# Patient Record
Sex: Female | Born: 1960 | Race: White | Hispanic: No | Marital: Single | Smoking: Never smoker
Health system: Southern US, Community
[De-identification: ages and names within clinical notes are randomized; demographics above are authoritative.]

## PROBLEM LIST (undated history)

## (undated) DIAGNOSIS — K3184 Gastroparesis: Secondary | ICD-10-CM

## (undated) DIAGNOSIS — E1121 Type 2 diabetes mellitus with diabetic nephropathy: Secondary | ICD-10-CM

## (undated) DIAGNOSIS — I639 Cerebral infarction, unspecified: Secondary | ICD-10-CM

## (undated) DIAGNOSIS — M8580 Other specified disorders of bone density and structure, unspecified site: Secondary | ICD-10-CM

## (undated) DIAGNOSIS — I255 Ischemic cardiomyopathy: Secondary | ICD-10-CM

## (undated) DIAGNOSIS — E1140 Type 2 diabetes mellitus with diabetic neuropathy, unspecified: Secondary | ICD-10-CM

## (undated) DIAGNOSIS — F329 Major depressive disorder, single episode, unspecified: Secondary | ICD-10-CM

## (undated) DIAGNOSIS — D509 Iron deficiency anemia, unspecified: Secondary | ICD-10-CM

## (undated) DIAGNOSIS — E785 Hyperlipidemia, unspecified: Secondary | ICD-10-CM

## (undated) DIAGNOSIS — I2510 Atherosclerotic heart disease of native coronary artery without angina pectoris: Secondary | ICD-10-CM

## (undated) DIAGNOSIS — I214 Non-ST elevation (NSTEMI) myocardial infarction: Secondary | ICD-10-CM

## (undated) DIAGNOSIS — I951 Orthostatic hypotension: Secondary | ICD-10-CM

## (undated) DIAGNOSIS — F32A Depression, unspecified: Secondary | ICD-10-CM

## (undated) DIAGNOSIS — Z8614 Personal history of Methicillin resistant Staphylococcus aureus infection: Secondary | ICD-10-CM

## (undated) DIAGNOSIS — I10 Essential (primary) hypertension: Secondary | ICD-10-CM

## (undated) HISTORY — DX: Essential (primary) hypertension: I10

## (undated) HISTORY — DX: Atherosclerotic heart disease of native coronary artery without angina pectoris: I25.10

## (undated) HISTORY — DX: Orthostatic hypotension: I95.1

## (undated) HISTORY — DX: Major depressive disorder, single episode, unspecified: F32.9

## (undated) HISTORY — DX: Other specified disorders of bone density and structure, unspecified site: M85.80

## (undated) HISTORY — DX: Type 2 diabetes mellitus with diabetic nephropathy: E11.21

## (undated) HISTORY — DX: Gastroparesis: K31.84

## (undated) HISTORY — DX: Hyperlipidemia, unspecified: E78.5

## (undated) HISTORY — PX: TONSILLECTOMY: SUR1361

## (undated) HISTORY — DX: Iron deficiency anemia, unspecified: D50.9

## (undated) HISTORY — PX: REFRACTIVE SURGERY: SHX103

## (undated) HISTORY — DX: Ischemic cardiomyopathy: I25.5

## (undated) HISTORY — DX: Depression, unspecified: F32.A

## (undated) HISTORY — DX: Personal history of Methicillin resistant Staphylococcus aureus infection: Z86.14

---

## 1978-04-23 HISTORY — PX: APPENDECTOMY: SHX54

## 1985-04-23 HISTORY — PX: TUBAL LIGATION: SHX77

## 1997-10-22 ENCOUNTER — Emergency Department (HOSPITAL_COMMUNITY): Admission: EM | Admit: 1997-10-22 | Discharge: 1997-10-22 | Payer: Self-pay | Admitting: Emergency Medicine

## 1998-09-05 ENCOUNTER — Other Ambulatory Visit: Admission: RE | Admit: 1998-09-05 | Discharge: 1998-09-05 | Payer: Self-pay | Admitting: Obstetrics and Gynecology

## 1999-06-22 ENCOUNTER — Encounter: Payer: Self-pay | Admitting: Emergency Medicine

## 1999-06-22 ENCOUNTER — Emergency Department (HOSPITAL_COMMUNITY): Admission: EM | Admit: 1999-06-22 | Discharge: 1999-06-22 | Payer: Self-pay | Admitting: Emergency Medicine

## 1999-11-23 ENCOUNTER — Other Ambulatory Visit: Admission: RE | Admit: 1999-11-23 | Discharge: 1999-11-23 | Payer: Self-pay | Admitting: Family Medicine

## 2000-12-12 ENCOUNTER — Other Ambulatory Visit: Admission: RE | Admit: 2000-12-12 | Discharge: 2000-12-12 | Payer: Self-pay | Admitting: Family Medicine

## 2001-08-20 ENCOUNTER — Encounter: Admission: RE | Admit: 2001-08-20 | Discharge: 2001-11-18 | Payer: Self-pay | Admitting: Specialist

## 2001-11-19 ENCOUNTER — Encounter: Admission: RE | Admit: 2001-11-19 | Discharge: 2001-12-01 | Payer: Self-pay | Admitting: Specialist

## 2001-12-15 ENCOUNTER — Other Ambulatory Visit: Admission: RE | Admit: 2001-12-15 | Discharge: 2001-12-15 | Payer: Self-pay | Admitting: Family Medicine

## 2003-01-14 ENCOUNTER — Other Ambulatory Visit: Admission: RE | Admit: 2003-01-14 | Discharge: 2003-01-14 | Payer: Self-pay | Admitting: Family Medicine

## 2003-01-21 ENCOUNTER — Encounter: Payer: Self-pay | Admitting: Family Medicine

## 2003-01-21 ENCOUNTER — Ambulatory Visit (HOSPITAL_COMMUNITY): Admission: RE | Admit: 2003-01-21 | Discharge: 2003-01-21 | Payer: Self-pay | Admitting: Family Medicine

## 2004-05-12 ENCOUNTER — Other Ambulatory Visit: Admission: RE | Admit: 2004-05-12 | Discharge: 2004-05-12 | Payer: Self-pay | Admitting: Family Medicine

## 2008-08-27 ENCOUNTER — Telehealth: Payer: Self-pay | Admitting: Internal Medicine

## 2008-08-30 ENCOUNTER — Encounter: Payer: Self-pay | Admitting: Internal Medicine

## 2008-08-30 ENCOUNTER — Ambulatory Visit: Payer: Self-pay | Admitting: Gastroenterology

## 2008-08-30 DIAGNOSIS — D509 Iron deficiency anemia, unspecified: Secondary | ICD-10-CM

## 2008-08-30 DIAGNOSIS — E785 Hyperlipidemia, unspecified: Secondary | ICD-10-CM | POA: Insufficient documentation

## 2008-08-30 DIAGNOSIS — I10 Essential (primary) hypertension: Secondary | ICD-10-CM | POA: Insufficient documentation

## 2008-09-07 ENCOUNTER — Telehealth: Payer: Self-pay | Admitting: Internal Medicine

## 2008-09-07 LAB — CONVERTED CEMR LAB: Ferritin: 3.6 ng/mL — ABNORMAL LOW (ref 10.0–291.0)

## 2008-09-08 ENCOUNTER — Ambulatory Visit (HOSPITAL_COMMUNITY): Admission: RE | Admit: 2008-09-08 | Discharge: 2008-09-08 | Payer: Self-pay | Admitting: Internal Medicine

## 2008-09-08 ENCOUNTER — Ambulatory Visit: Payer: Self-pay | Admitting: Internal Medicine

## 2008-09-08 ENCOUNTER — Encounter: Payer: Self-pay | Admitting: Internal Medicine

## 2008-09-09 ENCOUNTER — Encounter: Payer: Self-pay | Admitting: Internal Medicine

## 2008-09-23 ENCOUNTER — Encounter: Payer: Self-pay | Admitting: Physician Assistant

## 2009-04-25 ENCOUNTER — Telehealth (INDEPENDENT_AMBULATORY_CARE_PROVIDER_SITE_OTHER): Payer: Self-pay | Admitting: *Deleted

## 2010-03-25 ENCOUNTER — Inpatient Hospital Stay (HOSPITAL_COMMUNITY)
Admission: EM | Admit: 2010-03-25 | Discharge: 2010-04-05 | Disposition: A | Discharge status: Other Acute Inpatient Hospital | Payer: Self-pay | Source: Home / Self Care | Attending: Endocrinology | Admitting: Endocrinology

## 2010-03-29 ENCOUNTER — Ambulatory Visit (HOSPITAL_COMMUNITY)
Admission: AD | Admit: 2010-03-29 | Discharge: 2010-04-05 | Payer: Self-pay | Source: Home / Self Care | Attending: Endocrinology | Admitting: Endocrinology

## 2010-03-31 ENCOUNTER — Encounter: Payer: Self-pay | Admitting: Cardiovascular Disease

## 2010-04-05 ENCOUNTER — Encounter: Payer: Self-pay | Admitting: Cardiology

## 2010-04-09 ENCOUNTER — Inpatient Hospital Stay (HOSPITAL_COMMUNITY)
Admission: EM | Admit: 2010-04-09 | Discharge: 2010-04-12 | Payer: Self-pay | Source: Home / Self Care | Attending: Endocrinology | Admitting: Endocrinology

## 2010-04-12 ENCOUNTER — Ambulatory Visit: Payer: Self-pay | Admitting: Cardiology

## 2010-04-20 ENCOUNTER — Encounter: Payer: Self-pay | Admitting: Physician Assistant

## 2010-04-24 ENCOUNTER — Encounter: Payer: Self-pay | Admitting: Physician Assistant

## 2010-04-24 ENCOUNTER — Encounter: Payer: Self-pay | Admitting: Cardiology

## 2010-04-25 ENCOUNTER — Ambulatory Visit
Admission: RE | Admit: 2010-04-25 | Discharge: 2010-04-25 | Payer: Self-pay | Source: Home / Self Care | Attending: Physician Assistant | Admitting: Physician Assistant

## 2010-04-25 ENCOUNTER — Encounter: Payer: Self-pay | Admitting: Physician Assistant

## 2010-04-25 DIAGNOSIS — E1139 Type 2 diabetes mellitus with other diabetic ophthalmic complication: Secondary | ICD-10-CM | POA: Insufficient documentation

## 2010-04-25 DIAGNOSIS — I951 Orthostatic hypotension: Secondary | ICD-10-CM | POA: Insufficient documentation

## 2010-04-25 DIAGNOSIS — I5022 Chronic systolic (congestive) heart failure: Secondary | ICD-10-CM | POA: Insufficient documentation

## 2010-04-25 DIAGNOSIS — E109 Type 1 diabetes mellitus without complications: Secondary | ICD-10-CM | POA: Insufficient documentation

## 2010-04-25 DIAGNOSIS — I2510 Atherosclerotic heart disease of native coronary artery without angina pectoris: Secondary | ICD-10-CM | POA: Insufficient documentation

## 2010-04-25 DIAGNOSIS — I255 Ischemic cardiomyopathy: Secondary | ICD-10-CM | POA: Insufficient documentation

## 2010-04-26 ENCOUNTER — Telehealth: Payer: Self-pay | Admitting: Cardiology

## 2010-04-26 ENCOUNTER — Encounter: Payer: Self-pay | Admitting: Cardiology

## 2010-05-02 ENCOUNTER — Telehealth: Payer: Self-pay | Admitting: Cardiology

## 2010-05-03 ENCOUNTER — Telehealth: Payer: Self-pay | Admitting: Cardiology

## 2010-05-05 ENCOUNTER — Ambulatory Visit
Admission: RE | Admit: 2010-05-05 | Discharge: 2010-05-05 | Payer: Self-pay | Source: Home / Self Care | Attending: Cardiology | Admitting: Cardiology

## 2010-05-08 ENCOUNTER — Telehealth: Payer: Self-pay | Admitting: Cardiology

## 2010-05-08 ENCOUNTER — Encounter: Payer: Self-pay | Admitting: Cardiology

## 2010-05-10 ENCOUNTER — Telehealth: Payer: Self-pay | Admitting: Cardiology

## 2010-05-11 ENCOUNTER — Other Ambulatory Visit: Payer: Self-pay | Admitting: Cardiology

## 2010-05-11 ENCOUNTER — Ambulatory Visit
Admission: RE | Admit: 2010-05-11 | Discharge: 2010-05-11 | Payer: Self-pay | Source: Home / Self Care | Attending: Cardiology | Admitting: Cardiology

## 2010-05-11 ENCOUNTER — Ambulatory Visit: Admission: RE | Admit: 2010-05-11 | Discharge: 2010-05-11 | Payer: Self-pay | Source: Home / Self Care

## 2010-05-11 ENCOUNTER — Ambulatory Visit (HOSPITAL_COMMUNITY)
Admission: RE | Admit: 2010-05-11 | Discharge: 2010-05-11 | Payer: Self-pay | Source: Home / Self Care | Attending: Cardiology | Admitting: Cardiology

## 2010-05-11 ENCOUNTER — Encounter: Payer: Self-pay | Admitting: Cardiology

## 2010-05-11 LAB — BASIC METABOLIC PANEL
BUN: 14 mg/dL (ref 6–23)
CO2: 27 mEq/L (ref 19–32)
Calcium: 9.6 mg/dL (ref 8.4–10.5)
Chloride: 99 mEq/L (ref 96–112)
Creatinine, Ser: 0.9 mg/dL (ref 0.4–1.2)
GFR: 72.71 mL/min (ref >60.00)
Glucose, Bld: 276 mg/dL — ABNORMAL HIGH (ref 70–99)
Potassium: 4.4 mEq/L (ref 3.5–5.1)
Sodium: 134 mEq/L — ABNORMAL LOW (ref 135–145)

## 2010-05-11 LAB — BRAIN NATRIURETIC PEPTIDE: Pro B Natriuretic peptide (BNP): 199.3 pg/mL — ABNORMAL HIGH (ref 0.0–100.0)

## 2010-05-12 LAB — CONVERTED CEMR LAB: Digitoxin Lvl: 1 ng/mL (ref 0.8–2.0)

## 2010-05-15 ENCOUNTER — Encounter: Payer: Self-pay | Admitting: Cardiology

## 2010-05-18 ENCOUNTER — Encounter: Payer: Self-pay | Admitting: Cardiology

## 2010-05-18 ENCOUNTER — Ambulatory Visit
Admission: RE | Admit: 2010-05-18 | Discharge: 2010-05-18 | Payer: Self-pay | Source: Home / Self Care | Attending: Cardiology | Admitting: Cardiology

## 2010-05-22 ENCOUNTER — Encounter (HOSPITAL_COMMUNITY)
Admission: RE | Admit: 2010-05-22 | Discharge: 2010-05-23 | Payer: Self-pay | Source: Home / Self Care | Attending: Cardiology | Admitting: Cardiology

## 2010-05-23 NOTE — Progress Notes (Signed)
Summary: PT to come to lab  Phone Note Outgoing Call   Call placed by: Joselyn Glassman,  April 25, 2009 9:47 AM Call placed to: Patient Summary of Call: LM on home number and work number for pt to come to our labs to repeat CBC and Iron stuides she had done in June 2010.  Left her instrucftions to come to our lab in our basement level. Initial call taken by: Joselyn Glassman,  April 25, 2009 9:48 AM

## 2010-05-24 ENCOUNTER — Ambulatory Visit (HOSPITAL_COMMUNITY): Payer: Self-pay

## 2010-05-24 ENCOUNTER — Ambulatory Visit (HOSPITAL_COMMUNITY): Payer: Self-pay | Attending: Cardiology

## 2010-05-24 DIAGNOSIS — E119 Type 2 diabetes mellitus without complications: Secondary | ICD-10-CM | POA: Insufficient documentation

## 2010-05-24 DIAGNOSIS — I2589 Other forms of chronic ischemic heart disease: Secondary | ICD-10-CM | POA: Insufficient documentation

## 2010-05-24 DIAGNOSIS — I2510 Atherosclerotic heart disease of native coronary artery without angina pectoris: Secondary | ICD-10-CM | POA: Insufficient documentation

## 2010-05-24 DIAGNOSIS — I252 Old myocardial infarction: Secondary | ICD-10-CM | POA: Insufficient documentation

## 2010-05-24 DIAGNOSIS — Z5189 Encounter for other specified aftercare: Secondary | ICD-10-CM | POA: Insufficient documentation

## 2010-05-25 ENCOUNTER — Telehealth: Payer: Self-pay | Admitting: Cardiology

## 2010-05-25 NOTE — Progress Notes (Signed)
Summary: pt call to reading  Phone Note Call from Patient Call back at Home Phone [phone]   Caller: Patient Reason for Call: Talk to Nurse, Talk to Doctor Summary of Call: pt was told to call this morning with reading and it is 117/68 her feet are still a little puffy but they have gone down some Initial call taken by: Omer Jack,  May 03, 2010 9:07 AM  Follow-up for Phone Call        spoke w/pt she states edema is better, wt is the same per Dr Kathlyn [place] note on yest. phone mess rx for lasix 20 and kcl 10 sent into her pharmacy appt w/Dr Shirlee Latch sch for Fri 1/13 at 10:30, at that appt we will give her an order to have labs next week at [facility], RN  May 03, 2010 9:41 AM     New/Updated Medications: FUROSEMIDE 20 MG TABS (FUROSEMIDE) Take one tablet by mouth daily. POTASSIUM CHLORIDE CR 10 MEQ CR-CAPS (POTASSIUM CHLORIDE) Take one tablet by mouth daily Prescriptions: POTASSIUM CHLORIDE CR 10 MEQ CR-CAPS (POTASSIUM CHLORIDE) Take one tablet by mouth daily  #30 x 6   Entered by:   Meredith Staggers, RN   Authorized by:   Marca Ancona, MD   Signed by:   Meredith Staggers, RN on 05/03/2010   Method used:   [facility]ronically to        [facility] [phone]* (retail)       [address]       [place], [place]  [address]       Ph: [phone] or [phone]       Fax: [phone]   RxID:   [id number] FUROSEMIDE 20 MG TABS (FUROSEMIDE) Take one tablet by mouth daily.  #30 x 6   Entered by:   Meredith Staggers, RN   Authorized by:   Marca Ancona, MD   Signed by:   Meredith Staggers, RN on 05/03/2010   Method used:   [facility]ically to        [facility] [phone]* (retail)       [address]       [place], [place]  [address]       Ph: [phone] or [phone]       Fax: [phone]   RxID:   [id number]

## 2010-05-25 NOTE — Assessment & Plan Note (Signed)
Summary: rov   Visit Type:  rov Primary Provider:  [place], MD  CC:  Swelling in feet and going down.  History of Present Illness: 50 yo with history of Type I diabetes presents for cardiology followup.  She had a prolonged admission with DKA and systolic CHF in 12/11.  Cardiac enzymes were found to be elevated and left heart cath showed occluded LAD, which appeared chronic at the time of cath.  Echo showed peri-apical akinesis with EF 30%.  Management was complicated by orthostatic hypotension causing syncope.  This was likely due to combination of low cardiac output and diabetic autonomic neuropathy.  This limited titration of cardiac medications.    Patient has been doing fairly well at home.  She can walk up to 1/4 mile but then tires and has to stop.  Not much shortness of breath but she does note fatigue.  No orthopnea or PND.  No chest pain.  She is not getting any lightheadedness with standing now.  She called earlier in the week with significant lower extremity edema that had gradually built up.  She was not sent home from the hospital on Lasix.  I had her start Lasix 20 mg daily with KCl 10 mEeq daily.  Her ankle swelling is improving.  She is trying to get disability (she has no insurance coverage at this time, unfortunately).   ECG (prior appt): NSR, old anteroseptal MI, QRS not prolonged.   Labs (12/11): K 3.7, creatinine 0.88, BNP 612, LDL 29, HDL 44   Current Medications (verified): 1)  Humalog Kwikpen 100 Unit/ml Soln (Insulin Lispro (Human)) .... Sliding Scale 2)  Lantus Solostar 100 Unit/ml Soln (Insulin Glargine) .... 30 Units Subcutaneously At Bedtime 3)  Crestor 20 Mg Tabs (Rosuvastatin Calcium) .... Take 1 Tablet By Mouth Once A Day 4)  Venlafaxine Hcl 75 Mg Tabs (Venlafaxine Hcl) .... 2 Tabs Once Daily 5)  Multivitamins  Tabs (Multiple Vitamin) .... Take 1 Tablet By Mouth Once A Day 6)  Integra Plus  Caps (Fefum-Fepoly-Fa-B Cmp-C-Biot) .... Take One By Mouth Once  Daily 7)  Calcium Carbonate-Vitamin D 600-200 Mg-Unit Caps (Calcium Carbonate-Vitamin D) .[name].. 1 Tab Once Daily 8)  Tylenol Extra Strength 500 Mg Tabs (Acetaminophen) .[name].. 1 Tab As Needed 9)  Alprazolam 0.5 Mg Tabs (Alprazolam) .... 1/2 To 1 Tab Two Times A Day As Needed 10)  Plavix 75 Mg Tabs (Clopidogrel Bisulfate) .[name].. 1 Tab Once Daily 11)  Metoprolol Succinate 25 Mg Xr24h-Tab (Metoprolol Succinate) .... 1/2 Tab Once Daily 12)  Digoxin 0.125 Mg Tabs (Digoxin) .[name].. 1 Tab Once Daily 13)  Furosemide 20 Mg Tabs (Furosemide) .... Take One Tablet By Mouth Daily. 14)  Potassium Chloride Cr 10 Meq Cr-Caps (Potassium Chloride) .... Take One Tablet By Mouth Daily 15)  Aspirin Ec 325 Mg Tbec (Aspirin) .... Take One Tablet By Mouth Daily  Allergies (verified): 1)  Reglan 2)  Erythromycin  Past History:  Past Medical History: 1. Hypertension: However, since MI has had orthostatic hypotension.  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 CAD: NSTEMI 12/11.  Cardiac catheterization (12/11) showing LAD totalled in the midportion, diagonal 70% stenosed (small 2-mm vessel), circumflex nonobstructive, OM1 40%, OM2 30%, RCA small and no significant disease.  LAD occlusion was calcified and appeared chronic at the time of catheterization so no intervention. 5. Ischemic cardiomyopathy: Echo (12/11) with EF 30% and periapical akinesis, no significant MR, RV looked normal.  QRS is not wide on ECG.  6. Orthostatic  hypotension: Syncopal episode in 12/11 likely due to this.  Probably from combination of depressed cardiac output and diabetic autonomic neuropathy.  7. Type 1 insulin-dependent diabetes. History of DKA.  8. Hyperlipidemia.  9. Diabetic retinopathy.  10. Gastroparesis.  11. Diabetic nephropathy  12. Depression.   Family History: Reviewed history from 08/30/2008 and no changes required. No FH of Colon Cancer: Family History of Pancreatic Cancer: Father deceased 34  Social  History: Divorced, 1 boy unemployed, lives in [place] Patient has never smoked.  Alcohol Use - no Daily Caffeine Use 1 cup/day Illicit Drug Use - no  Review of Systems       All systems reviewed and negative except as per HPI.   Vital Signs:  Patient profile:   50 year old female Height:      64.75 inches Weight:      148.50 pounds BMI:     24.99 Pulse rate:   84 / minute BP sitting:   128 / 72  (left arm) Cuff size:   regular  Vitals Entered By: Caralee Ates CMA (May 05, 2010 10:25 AM)  Physical Exam  General:  Well developed, well nourished, in no acute distress. Neck:  Neck supple, no JVD. No masses, thyromegaly or abnormal cervical nodes. Lungs:  Clear bilaterally to auscultation and percussion. Heart:  Non-displaced PMI, chest non-tender; regular rate and rhythm, S1, S2 without murmurs, rubs or gallops. Carotid upstroke normal, no bruit.Pedals normal pulses. 1+ ankle edema.  Abdomen:  Bowel sounds positive; abdomen soft and non-tender without masses, organomegaly, or hernias noted. No hepatosplenomegaly. Extremities:  No clubbing or cyanosis. Neurologic:  Alert and oriented x 3. Psych:  Normal affect.   Impression & Recommendations:  Problem # 1:  CHRONIC SYSTOLIC HEART FAILURE (ICD-428.22) Ischemic cardiomyopathy with EF 30%.  Probably NYHA III symptoms.  She is on minimal cardiac meds due to problems with orthostatic hypotension.  She denies any positional lightheadedness at this point and her BP is much better (128/72 today).  I am going to carefully uptitrate her cardiac regimen. - Continue current Lasix and KCl (may decrease to every other day at next appointment). - BMET/BNP next week.  - Continue digoxin and check level.  - Continue current Toprol XL for now, may change to Coreg soon if she is doing well and tolerating meds.  - Start enalapril 1.25 mg two times a day.  If she tolerates this symptomatically, I want her to increase it to 2.5 mg two times a  day after 5 days.  - Start spironolactone 12.5 mg daily.  - Followup with me in 2 weeks.  - She will also get an echocardiogram (now at least 1 month post-MI with no intervention).  If EF still < 35%, will need an ICD.  Does not have long QRS so will not qualify for biventricular pacing.   Problem # 2:  CORONARY ATHEROSCLEROSIS NATIVE CORONARY ARTERY (ICD-414.01) MI in 12/11 with occluded LAD.  When cath was done, LAD occlusion appeared chronic so it was not opened.  Continue ASA, Plavix (would continue Plavix for a year post-MI), Crestor.  Will have her start cardiac rehab at [facility].   Problem # 3:  HYPERLIPIDEMIA (ICD-272.4) Goal LDL < 70.  It was checked in 12/11 and was at goal.   Other Orders: Echocardiogram (Echo)  Patient Instructions: 1)  Start enalapril 2.5mg  1/2 tablet two times a day. 2)  If you tolerate the low dose of enalapril, after 5 days, increase enalapril to  2.5mg  a whole tablet two times a day. 3)  Start Spironalactone 25mg  1/2 tablet once daily. 4)  Your physician has requested that you have an echocardiogram.  Echocardiography is a painless test that uses sound waves to create images of your heart. It provides your doctor with information about the size and shape of your heart and how well your heart's chambers and valves are working.  This procedure takes approximately one hour. There are no restrictions for this procedure. 5)  Return for labwork in 1 week (around wednsday): digoxin/ bmet/bnp (414.01;428.22). 6)  Your physician recommends that you schedule a follow-up appointment in: 2 weeks. Prescriptions: ENALAPRIL MALEATE 2.5 MG TABS (ENALAPRIL MALEATE) take 1/2 tablet two times a day x 5 days, then increase to 1 tablet two times a day  #60 x 6   Entered by:   Sherri Rad, RN, BSN   Authorized by:   Marca Ancona, MD   Signed by:   Sherri Rad, RN, BSN on 05/05/2010   Method used:   Electronically to        [facility] [phone]* (retail)        [address]       [place], [place]  [address]       Ph: [phone] or [phone]       Fax: [phone]   RxID:   [id number] SPIRONOLACTONE 25 MG TABS (SPIRONOLACTONE) Take 1/2  tablet by mouth daily  #30 x 6   Entered by:   Sherri Rad, RN, BSN   Authorized by:   Marca Ancona, MD   Signed by:   Sherri Rad, RN, BSN on 05/05/2010   Method used:   Electronically to        [facility] [phone]* (retail)       [address]       [place], [place]  [address]       Ph: [phone] or [phone]       Fax: [phone]   RxID:   [id number]   Appended Document: rov Plavix samples given to pt at today's office visit. # 8 tablets.  Appended Document: rov order faxed to [facility] [phone] forwarded to HIM to be scanned into EMR

## 2010-05-25 NOTE — Letter (Signed)
Summary: Patient Letter  Patient Letter   Imported By: Marylou Mccoy 04/26/2010 14:33:11  _____________________________________________________________________  External Attachment:    Type:   Image     Comment:   External Document

## 2010-05-25 NOTE — Letter (Signed)
Summary: Generic Letter  Architectural technologist, Main Office  [address]   [place], [place] [address]   Phone: [phone]  Fax: [phone]        April 26, 2010 MRN: [id number]    [name] [address] [place], [place]  [address] DOB October 31, 1960   To Whom It May Concern:  [name] is unable to work because of uncontrolled diabetes, coronary artery disease, congestive heart failure with ischemic cardiomypathy, and is experiencing some problems with short term memory.      Sincerely,      Howie Rufus,MD  This letter has been electronically signed by your physician.  Appended Document: Generic Letter mailed to pt

## 2010-05-25 NOTE — Progress Notes (Addendum)
Summary: pt having swelling in feet x 2-3 days  Phone Note Call from Patient   Caller: Patient [phone] Reason for Call: Talk to Nurse Summary of Call: pt having swelling in feet x 2-3 days Initial call taken by: Glynda Jaeger,  May 02, 2010 10:05 AM  Follow-up for Phone Call        RN s/w Pt re: pedal edema 'feet look like a bee stung them, unable to see her ankles'. Pt reports b/p 'running on the low side' although unable to provide b/p numbers. She has been recording daily weights and has only gained 1 pound. Pt denies dyspnea, SOB, dizziness or lighthededness.  RN reviewed with Tereso Newcomer, PA (seen 04/25/2010). [name] would like for Pt to elevate her feet above her heart, record b/p numbers and weight and to call back tomorrow, Wed 05/03/2010  to report if edema is better or worse. Pt agreeable to plan. Follow-up by: Bernita Raisin, RN, BSN,  May 02, 2010 11:10 AM     Appended Document: pt having swelling in feet x 2-3 days She has significant CHF, would also have her start Lasix 20 mg daily + KCl 10 daily with BMET/BNP in 1 week.  I would like to see her next week or later this week.   Appended Document: pt having swelling in feet x 2-3 days this has been taken care of, see phone mess dated 1/11

## 2010-05-25 NOTE — Letter (Signed)
Summary: Generic Letter  Architectural technologist, Main Office  [address]   [place], [place] [address]   Phone: [phone]  Fax: [phone]        May 18, 2010 MRN: [id number]    Ardelle Grau [address] [place], [place]  [address] DOB 04-19-1961    BMP in 2 weeks---414.01  428.22 Please fax results to [phone]         Freida Busman Pinchus Weckwerth,MD  This letter has been electronically signed by your physician.

## 2010-05-25 NOTE — Assessment & Plan Note (Signed)
Summary: eph   Visit Type:  EPH Primary Provider:  [place] Prince  CC:  pt denies any cardiac complaints since she has been out of the hospital...does want to know though if she is supposed to still take the digoxin after the 30 days.  History of Present Illness: Primary Cardiologist:  Dr. Marca Ancona  [name] is a 50 yo female with Type 1 DM who was admitted to [facility] in hospital December 3 with symptoms concerning for diabetic ketoacidosis.  However, she ruled in for non-ST elevation myocardial infarction.  Cardiac catheterization performed December 8 demonstrated a totally occluded LAD, 70% stenosis in a very small diagonal, no disease in the circumflex, 40% stenosis in the first obtuse marginal and 30% stenosis in the second obtuse marginal and no significant disease in the RCA.  Ejection fraction was 35% with anterior and inferior akinesis.  Medical therapy was pursued.  Echocardiography demonstrated an ejection fraction of 30% with multiple wall motion abnormalities.  She did develop systolic heart failure and was diuresed.  She also developed problems with orthostatic hypotension.  She was eventually discharged home and presented back to the hospital on December 18 with syncope. After an extensive workup, it was felt that she suffered from orthostatic hypotension resulting in her syncope.  Her ACE inhibitor and other CHF medications were discontinued.  She was placed on a very small amount of beta blocker before discharge.  She returns for followup today.  Overall, she is doing well.  She denies chest discomfort or shortness of breath.  She denies orthopnea or PND.  She denies pedal edema.  She denies lightheadedness, syncope or near syncope since she was discharged from the hospital.  She had labs performed that her primary care physician's office last week that included a CBC and a basic metabolic panel.  She has been tracking her blood pressures at home.  They have ranged anywhere from 94/49  to 129/67.    Current Medications (verified): 1)  Humalog Kwikpen 100 Unit/ml Soln (Insulin Lispro (Human)) .... Sliding Scale 2)  Lantus Solostar 100 Unit/ml Soln (Insulin Glargine) .... 30 Units Subcutaneously At Bedtime 3)  Crestor 20 Mg Tabs (Rosuvastatin Calcium) .... Take 1 Tablet By Mouth Once A Day 4)  Venlafaxine Hcl 75 Mg Tabs (Venlafaxine Hcl) .... 2 Tabs Once Daily 5)  Multivitamins  Tabs (Multiple Vitamin) .... Take 1 Tablet By Mouth Once A Day 6)  Integra Plus  Caps (Fefum-Fepoly-Fa-B Cmp-C-Biot) .... Take One By Mouth Once Daily 7)  Calcium Carbonate-Vitamin D 600-200 Mg-Unit Caps (Calcium Carbonate-Vitamin D) .[name].. 1 Tab Once Daily 8)  Tylenol Extra Strength 500 Mg Tabs (Acetaminophen) .[name].. 1 Tab As Needed 9)  Alprazolam 0.5 Mg Tabs (Alprazolam) .... 1/2 To 1 Tab Two Times A Day As Needed 10)  Plavix 75 Mg Tabs (Clopidogrel Bisulfate) .[name].. 1 Tab Once Daily 11)  Metoprolol Succinate 25 Mg Xr24h-Tab (Metoprolol Succinate) .... 1/2 Tab Once Daily 12)  Digoxin 0.125 Mg Tabs (Digoxin) .[name].. 1 Tab Once Daily  Allergies: 1)  Reglan 2)  Erythromycin  Past History:  Past Medical History: Hypertension VIT D DEFICIENCY FE DEFICIENCY ANEMIA 5/10 CAD   a.  NSTEMI on 03/26/2010   b.  cardiac catheterization showing LAD totalled in the midportion, diagonal       70% stenosed (2-mm vessel), circumflex nonobstructive, OM 140, OM       230, RCA small, and no significant disease. - medical therapy Ischemic cardiomyopathy with an EF of 35% at cath and  30% at echo  on March 31, 2010.  Type 1 insulin-dependent diabetes.  Orthostatic hypotension  Hyperlipidemia.  Autonomic neuropathy.  Diabetic retinopathy.  Gastroparesis.  Diabetic nephropathy with normal BUN and creatinine at discharge.  Depression.  Anemia.   Past Surgical History: Reviewed history from 04/11/2010 and no changes required. Appendectomy 1980 Tubal Ligation 1987 C-Section 1984 laser eye surgery x3  Social  History: Reviewed history from 08/30/2008 and no changes required. Divorced, 1 boy unemployed Patient has never smoked.  Alcohol Use - no Daily Caffeine Use 1 cup/day Illicit Drug Use - no  Vital Signs:  Patient profile:   50 year old female Height:      64.75 inches Weight:      148 pounds BMI:     24.91 Pulse rate:   78 / minute Pulse rhythm:   irregular BP sitting:   102 / 70  (left arm) Cuff size:   regular  Vitals Entered By: Danielle Rankin, CMA (April 25, 2010 2:49 PM)  Physical Exam  General:  Well nourished, well developed, in no acute distress HEENT: normal Neck: no JVD Cardiac:  normal S1, S2; RRR; no murmur Lungs:  clear to auscultation bilaterally, no wheezing, rhonchi or rales Abd: soft, nontender, no hepatomegaly Ext: no  edema Vascular: no carotid  bruits Skin: warm and dry Neuro:  CNs 2-12 intact, no focal abnormalities noted    EKG  Procedure date:  04/25/2010  Findings:      Normal Sinus Rhythm Heart rate 78 Normal axis Low-voltage Poor R-wave progression Nonspecific ST-T wave changes  Impression & Recommendations:  Problem # 1:  CORONARY ATHEROSCLEROSIS NATIVE CORONARY ARTERY (ICD-414.01)  Continue medical therapy with aspirin, Plavix and statin.  She is not having anginal symptoms.  Orders: EKG w/ Interpretation (93000)  Problem # 2:  ISCHEMIC CARDIOMYOPATHY (ICD-414.8) As noted, her ejection fraction is 30-35%.  She would likely be a candidate for AICD implantation.  However, at this time her blood pressures have limited medical therapy for her cardiomyopathy.  She will be brought back in close followup over the next several weeks.  As we are able, we will initiate ACE inhibitor.  Her blood pressure is not adequate for me to increase her beta blocker at this time.  She will continue on digoxin.  Her volume status seems stable.  I have asked her to continue to weigh herself and to contact us if she develops increased weight or increased  dyspnea and/or edema.  I will try to obtain lab work drawn at her primary care physician's office recently that included a basic metabolic panel.  As her medications are adjusted, her EF will be reassessed.  If this does not improve to >35%, she will likely need referral to EP for consideration of AICD.  She inquired about driving.  Given that she is stable without further lightheadedness or near syncope, I do not know of any reason from a cardiac standpoint that would prohibit her from driving and I discussed this with her.  Problem # 3:  CHRONIC SYSTOLIC HEART FAILURE (ICD-428.22) As above, her volume status appears stable.  Please see #2.  Problem # 4:  ORTHOSTATIC HYPOTENSION (ICD-458.0) At this point, her blood pressures appear stable.  As noted above, her blood pressures do not allow for medication titration this point.  Problem # 5:  HYPERTENSION (ICD-401.9) As above.  Problem # 6:  HYPERLIPIDEMIA (ICD-272.4) This is followed by her primary care physician.  Her updated medication list for this  problem includes:    Crestor 20 Mg Tabs (Rosuvastatin calcium) .[name]... Take 1 tablet by mouth once a day  Problem # 7:  ANEMIA-IRON DEFICIENCY (ICD-280.9) This is followed by her primary care physician.  She reports having an endoscopy and colonoscopy in 2010 that was essentially unremarkable.  Patient Instructions: 1)  Your physician recommends that you schedule a follow-up appointment in: PLEASE  CALL OFFICE [phone] TO SCHEDULE YOUR APPOINTMENT TO SEE DR. MCLEAN IN 1 MONTH AS PER Darriana Deboy, [place].  Appended Document: eph appt 05/24/10 with Dr Shirlee Latch

## 2010-05-25 NOTE — Progress Notes (Signed)
Summary: question on meds  Phone Note Call from Patient Call back at Home Phone [phone]   Reason for Call: Talk to Nurse Summary of Call: pt has question re meds. Initial call taken by: Roe Coombs,  May 08, 2010 9:28 AM  Follow-up for Phone Call        discussed medications with pt

## 2010-05-25 NOTE — Progress Notes (Signed)
Summary: mg dosage  Phone Note Call from Patient   Caller: Patient Reason for Call: Talk to Nurse Summary of Call: pt states she takes DIGOXIN 0.125 MG TABS. pt states scott weaver told her to call in how many mg she takes of DIGOXIN. Initial call taken by: Roe Coombs,  April 26, 2010 10:40 AM

## 2010-05-25 NOTE — Letter (Signed)
Summary: Self-Recorded BP  Self-Recorded BP   Imported By: Marylou Mccoy 05/08/2010 15:10:54  _____________________________________________________________________  External Attachment:    Type:   Image     Comment:   External Document

## 2010-05-25 NOTE — Assessment & Plan Note (Signed)
Summary: 2WK F/U SL   Visit Type:  Follow-up 2 week Primary Provider:  Adrian Prince, MD  CC:  slight edema in ankles. pt has no other complaints.  History of Present Illness: 50 yo with history of Type I diabetes presents for cardiology followup.  She had a prolonged admission with DKA and systolic CHF in 12/11.  Cardiac enzymes were found to be elevated and left heart cath showed occluded LAD, which appeared chronic at the time of cath.  Echo showed peri-apical akinesis with EF 30%.  Management was complicated by orthostatic hypotension causing syncope.  This was likely due to combination of low cardiac output and diabetic autonomic neuropathy.  This limited titration of cardiac medications initially.    Patient continues to do well at home.  She no longer has orthostatic symptoms.  Repeat echo this month showed EF 40-45% (improved).  She is going to start cardiac rehab on Monday.  She is able to walk about 15 minutes at a time without dyspnea.  No chest pain.  No orthopnea or PND.  She can climb a flight of steps without much trouble.  Weight is down 4 lbs since prior appointment.    ECG: NSR, old anteroseptal MI, QRS not prolonged.   Labs (12/11): K 3.7, creatinine 0.88, BNP 612, LDL 29, HDL 44 Labs (1/12): digoxin 1.0, BNP 199, K 4.4, creatinine 0.9   Current Medications (verified): 1)  Humalog Kwikpen 100 Unit/ml Soln (Insulin Lispro (Human)) .... Sliding Scale 2)  Lantus Solostar 100 Unit/ml Soln (Insulin Glargine) .... 30 Units Subcutaneously At Bedtime 3)  Crestor 20 Mg Tabs (Rosuvastatin Calcium) .... Take 1 Tablet By Mouth Once A Day 4)  Multivitamins  Tabs (Multiple Vitamin) .... Take 1 Tablet By Mouth Once A Day 5)  Calcium Carbonate-Vitamin D 600-200 Mg-Unit Caps (Calcium Carbonate-Vitamin D) .[name].. 1 Tab Once Daily 6)  Tylenol Extra Strength 500 Mg Tabs (Acetaminophen) .[name].. 1 Tab As Needed 7)  Alprazolam 0.5 Mg Tabs (Alprazolam) .... Take 1/2  Tablet By Mouth Once A Day 8)   Plavix 75 Mg Tabs (Clopidogrel Bisulfate) .[name].. 1 Tab Once Daily 9)  Metoprolol Succinate 25 Mg Xr24h-Tab (Metoprolol Succinate) .... 1/2 Tab Once Daily 10)  Digoxin 0.125 Mg Tabs (Digoxin) .[name].. 1 Tab Once Daily 11)  Furosemide 20 Mg Tabs (Furosemide) .... Take One Tablet By Mouth Daily. 12)  Potassium Chloride Cr 10 Meq Cr-Caps (Potassium Chloride) .... Take One Tablet By Mouth Daily 13)  Aspirin Ec 325 Mg Tbec (Aspirin) .... Take One Tablet By Mouth Daily 14)  Spironolactone 25 Mg Tabs (Spironolactone) .... Take 1/2  Tablet By Mouth Daily 15)  Enalapril Maleate 2.5 Mg Tabs (Enalapril Maleate) .... Take 1/2 Tablet Two Times A Day X 5 Days, Then Increase To 1 Tablet Two Times A Day 16)  Iron 325 (65 Fe) Mg Tabs (Ferrous Sulfate) .... Once Daily  Allergies (verified): 1)  Reglan 2)  Erythromycin  Past History:  Past Medical History: 1. Hypertension: However, since MI has had orthostatic hypotension.  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 CAD: NSTEMI 12/11.  Cardiac catheterization (12/11) showing LAD totalled in the midportion, diagonal 70% stenosed (small 2-mm vessel), circumflex nonobstructive, OM1 40%, OM2 30%, RCA small and no significant disease.  LAD occlusion was calcified and appeared chronic at the time of catheterization so no intervention. 5. Ischemic cardiomyopathy: Echo (12/11) with EF 30% and periapical akinesis, no significant MR, RV looked normal.  QRS is not wide on ECG.  Echo (1/12): EF 40-45%, severe hypokinesis of the anteroseptal wall, apical inferior wall, inferoseptal wall, and apex.  6. Orthostatic hypotension: Syncopal episode in 12/11 likely due to this.  Probably from combination of depressed cardiac output and diabetic autonomic neuropathy.  7. Type 1 insulin-dependent diabetes. History of DKA.  8. Hyperlipidemia.  9. Diabetic retinopathy.  10. Gastroparesis.  11. Diabetic nephropathy  12. Depression.   Family History: Reviewed history from  08/30/2008 and no changes required. No FH of Colon Cancer: Family History of Pancreatic Cancer: Father deceased 13  Social History: Reviewed history from 05/05/2010 and no changes required. Divorced, 1 boy unemployed, lives in [place] Patient has never smoked.  Alcohol Use - no Daily Caffeine Use 1 cup/day Illicit Drug Use - no  Review of Systems       All systems reviewed and negative except as per HPI.   Vital Signs:  Patient profile:   50 year old female Height:      64.75 inches Weight:      144 pounds BMI:     24.24 Pulse rate:   84 / minute BP sitting:   120 / 70  (right arm)  Vitals Entered By: Celestia Khat, CMA (May 18, 2010 10:32 AM)  Physical Exam  General:  Well developed, well nourished, in no acute distress. Head:  Annular skin lesion on forehead.  Neck:  Neck supple, no JVD. No masses, thyromegaly or abnormal cervical nodes. Lungs:  Clear bilaterally to auscultation and percussion. Heart:  Non-displaced PMI, chest non-tender; regular rate and rhythm, S1, S2 without murmurs, rubs or gallops. Carotid upstroke normal, no bruit. Pedals normal pulses. No edema.  Abdomen:  Bowel sounds positive; abdomen soft and non-tender without masses, organomegaly, or hernias noted. No hepatosplenomegaly. Extremities:  No clubbing or cyanosis. Neurologic:  Alert and oriented x 3. Psych:  Normal affect.   Impression & Recommendations:  Problem # 1:  CHRONIC SYSTOLIC HEART FAILURE (ICD-428.22) Ischemic cardiomyopathy.  Patient appears well-compensated today with NYHA class II symptoms.  She does not appear volume overloaded and weight is lower compared to prior.  BP has been doing well.  No orthostatic symptoms.  I will stop Toprol XL today and start her on Coreg 3.125 mg two times a day.  Increase spironolactone to 25 mg daily.  Continue current dose of enalapril.  Will need BMET in 2 wks, then followup with me in 6 wks.   Problem # 2:  CORONARY ATHEROSCLEROSIS  NATIVE CORONARY ARTERY (ICD-414.01) MI in 12/11 with occluded LAD.  When cath was done, LAD occlusion appeared chronic so it was not opened.  Continue ASA, Plavix (would continue Plavix for a year post-MI), Crestor.  She will start cardiac rehab soon.   Problem # 3:  HYPERLIPIDEMIA (ICD-272.4) Goal LDL < 70.  It was checked in 12/11 and was at goal.   Patient Instructions: 1)  Your physician has recommended you make the following change in your medication:  2)  Stop Toprol (metoprolol). 3)  Start Coreg(carvedilol) 3.125mg  twice a day. 4)  Use Lamisil Cream daily to affected area for 10 days. 5)  Increase Sprionolactone to 25mg  daily. 6)  Lab in 2 weeks---414.01  428.22--you have the order. Please fax the results to [phone]. 7)  Your physician recommends that you schedule a follow-up appointment in: 6 weeks with Dr Shirlee Latch. Prescriptions: LAMISIL AT 1 % CREA (TERBINAFINE HCL) apply daily to affected area for 10 days  #1 x 0   Entered by:  Katina Dung, RN, BSN   Authorized by:   Marca Ancona, MD   Signed by:   Katina Dung, RN, BSN on 05/18/2010   Method used:   Electronically to        [facility] [phone]* (retail)       [address]       [place], [place]  [address]       Ph: [phone] or [phone]       Fax: [phone]   RxID:   [id number] COREG 3.125 MG TABS (CARVEDILOL) one twice a day  #60 x 6   Entered by:   Katina Dung, RN, BSN   Authorized by:   Marca Ancona, MD   Signed by:   Katina Dung, RN, BSN on 05/18/2010   Method used:   Electronically to        [facility] [phone]* (retail)       [address]       [place], [place]  [address]       Ph: [phone] or [phone]       Fax: [phone]   RxID:   [id number] SPIRONOLACTONE 25 MG TABS (SPIRONOLACTONE) Take one daily  #30 x 6   Entered by:   Katina Dung, RN, BSN   Authorized by:   Marca Ancona, MD   Signed by:   Katina Dung, RN, BSN on 05/18/2010   Method used:   Electronically to        [facility] [phone]* (retail)       [address]       [place], [place]  [address]       Ph: [phone] or [phone]       Fax: [phone]   RxID:   [id number]

## 2010-05-25 NOTE — Progress Notes (Signed)
Summary: question on meds (lmtc x 2)  Phone Note From Other Clinic Call back at [phone] ext [phone]   Caller: latoya Summary of Call: has question re meds Initial call taken by: Roe Coombs,  May 10, 2010 12:33 PM  Follow-up for Phone Call        05/10/10--12noon--LMTCB--[facility]. Sherri Rad, RN, BSN  May 10, 2010 3:32 PM   Additional Follow-up for Phone Call Additional follow up Details #1::        [facility].  Additional Follow-up by: Lisabeth Devoid RN,  May 11, 2010 8:33 AM     Appended Document: question on meds (lmtc x 2) Meds should stay the same as discussed at office visit unless patient having significant side effects.   Appended Document: question on meds (lmtc x 2) I talked with pt by telephone

## 2010-05-25 NOTE — Letter (Signed)
Summary: Self-Recorded Meds  Self-Recorded Meds   Imported By: Marylou Mccoy 05/15/2010 13:05:26  _____________________________________________________________________  External Attachment:    Type:   Image     Comment:   External Document  Appended Document: Self-Recorded Meds Is she taking enalapril and spironolactone, which I started at last office appointment? If not, she should be taking them according to instructions given last time I saw her in the office.  If she is having difficulty managing her meds, followup with pharmacy consult for med management.   Appended Document: Self-Recorded Meds I talked with pt--she is taking enalapril and spironolactone--this must be an old med list--pt has appt 05/18/10 with Dr Shirlee Latch

## 2010-05-26 ENCOUNTER — Ambulatory Visit (HOSPITAL_COMMUNITY)
Admission: RE | Admit: 2010-05-26 | Discharge: 2010-05-26 | Disposition: A | Discharge status: Home / Self Care | Payer: Self-pay | Source: Ambulatory Visit | Attending: Cardiology | Admitting: Cardiology

## 2010-05-26 ENCOUNTER — Telehealth (INDEPENDENT_AMBULATORY_CARE_PROVIDER_SITE_OTHER): Payer: Self-pay | Admitting: *Deleted

## 2010-05-26 DIAGNOSIS — I2589 Other forms of chronic ischemic heart disease: Secondary | ICD-10-CM | POA: Insufficient documentation

## 2010-05-26 DIAGNOSIS — I252 Old myocardial infarction: Secondary | ICD-10-CM | POA: Insufficient documentation

## 2010-05-26 DIAGNOSIS — Z5189 Encounter for other specified aftercare: Secondary | ICD-10-CM | POA: Insufficient documentation

## 2010-05-26 DIAGNOSIS — I2510 Atherosclerotic heart disease of native coronary artery without angina pectoris: Secondary | ICD-10-CM | POA: Insufficient documentation

## 2010-05-26 DIAGNOSIS — E119 Type 2 diabetes mellitus without complications: Secondary | ICD-10-CM | POA: Insufficient documentation

## 2010-05-29 ENCOUNTER — Inpatient Hospital Stay (HOSPITAL_COMMUNITY)
Admission: EM | Admit: 2010-05-29 | Discharge: 2010-05-31 | DRG: 638 | Disposition: A | Discharge status: Home / Self Care | Payer: PRIVATE HEALTH INSURANCE | Attending: Endocrinology | Admitting: Endocrinology

## 2010-05-29 ENCOUNTER — Ambulatory Visit (HOSPITAL_COMMUNITY): Payer: PRIVATE HEALTH INSURANCE

## 2010-05-29 ENCOUNTER — Emergency Department (HOSPITAL_COMMUNITY): Payer: PRIVATE HEALTH INSURANCE

## 2010-05-29 ENCOUNTER — Inpatient Hospital Stay (HOSPITAL_COMMUNITY): Payer: PRIVATE HEALTH INSURANCE

## 2010-05-29 DIAGNOSIS — D649 Anemia, unspecified: Secondary | ICD-10-CM | POA: Diagnosis present

## 2010-05-29 DIAGNOSIS — E11319 Type 2 diabetes mellitus with unspecified diabetic retinopathy without macular edema: Secondary | ICD-10-CM | POA: Diagnosis present

## 2010-05-29 DIAGNOSIS — E1049 Type 1 diabetes mellitus with other diabetic neurological complication: Secondary | ICD-10-CM | POA: Diagnosis present

## 2010-05-29 DIAGNOSIS — I129 Hypertensive chronic kidney disease with stage 1 through stage 4 chronic kidney disease, or unspecified chronic kidney disease: Secondary | ICD-10-CM | POA: Diagnosis present

## 2010-05-29 DIAGNOSIS — Z7902 Long term (current) use of antithrombotics/antiplatelets: Secondary | ICD-10-CM

## 2010-05-29 DIAGNOSIS — N189 Chronic kidney disease, unspecified: Secondary | ICD-10-CM | POA: Diagnosis present

## 2010-05-29 DIAGNOSIS — F3289 Other specified depressive episodes: Secondary | ICD-10-CM | POA: Diagnosis present

## 2010-05-29 DIAGNOSIS — E1010 Type 1 diabetes mellitus with ketoacidosis without coma: Principal | ICD-10-CM | POA: Diagnosis present

## 2010-05-29 DIAGNOSIS — K3184 Gastroparesis: Secondary | ICD-10-CM | POA: Diagnosis present

## 2010-05-29 DIAGNOSIS — I252 Old myocardial infarction: Secondary | ICD-10-CM

## 2010-05-29 DIAGNOSIS — N179 Acute kidney failure, unspecified: Secondary | ICD-10-CM | POA: Diagnosis present

## 2010-05-29 DIAGNOSIS — E1065 Type 1 diabetes mellitus with hyperglycemia: Secondary | ICD-10-CM | POA: Diagnosis present

## 2010-05-29 DIAGNOSIS — G909 Disorder of the autonomic nervous system, unspecified: Secondary | ICD-10-CM | POA: Diagnosis present

## 2010-05-29 DIAGNOSIS — E785 Hyperlipidemia, unspecified: Secondary | ICD-10-CM | POA: Diagnosis present

## 2010-05-29 DIAGNOSIS — Z7982 Long term (current) use of aspirin: Secondary | ICD-10-CM

## 2010-05-29 DIAGNOSIS — E1039 Type 1 diabetes mellitus with other diabetic ophthalmic complication: Secondary | ICD-10-CM | POA: Diagnosis present

## 2010-05-29 DIAGNOSIS — Z794 Long term (current) use of insulin: Secondary | ICD-10-CM

## 2010-05-29 DIAGNOSIS — I428 Other cardiomyopathies: Secondary | ICD-10-CM | POA: Diagnosis present

## 2010-05-29 DIAGNOSIS — F329 Major depressive disorder, single episode, unspecified: Secondary | ICD-10-CM | POA: Diagnosis present

## 2010-05-29 DIAGNOSIS — I2510 Atherosclerotic heart disease of native coronary artery without angina pectoris: Secondary | ICD-10-CM | POA: Diagnosis present

## 2010-05-29 LAB — URINALYSIS, ROUTINE W REFLEX MICROSCOPIC
Ketones, ur: >80 mg/dL — AB
pH: 5 (ref 5.0–8.0)

## 2010-05-29 LAB — GLUCOSE, CAPILLARY
Glucose-Capillary: >600 mg/dL (ref 70–99)
Glucose-Capillary: 567 mg/dL (ref 70–99)
Glucose-Capillary: 448 mg/dL — ABNORMAL HIGH (ref 70–99)
Glucose-Capillary: 341 mg/dL — ABNORMAL HIGH (ref 70–99)
Glucose-Capillary: 288 mg/dL — ABNORMAL HIGH (ref 70–99)

## 2010-05-29 LAB — BASIC METABOLIC PANEL
CO2: 11 mEq/L — ABNORMAL LOW (ref 19–32)
CO2: 25 mEq/L (ref 19–32)
Calcium: 9.6 mg/dL (ref 8.4–10.5)
Calcium: 9.6 mg/dL (ref 8.4–10.5)
Calcium: 9.1 mg/dL (ref 8.4–10.5)
Chloride: 105 mEq/L (ref 96–112)
Creatinine, Ser: 1.96 mg/dL — ABNORMAL HIGH (ref 0.4–1.2)
Creatinine, Ser: 1.88 mg/dL — ABNORMAL HIGH (ref 0.4–1.2)
GFR calc Af Amer: 30 mL/min — ABNORMAL LOW (ref >60)
GFR calc non Af Amer: 24 mL/min — ABNORMAL LOW (ref >60)
GFR calc non Af Amer: 27 mL/min — ABNORMAL LOW (ref >60)
Glucose, Bld: 628 mg/dL (ref 70–99)
Glucose, Bld: 281 mg/dL — ABNORMAL HIGH (ref 70–99)
Potassium: 5.4 mEq/L — ABNORMAL HIGH (ref 3.5–5.1)
Potassium: 4.3 mEq/L (ref 3.5–5.1)
Sodium: 134 mEq/L — ABNORMAL LOW (ref 135–145)

## 2010-05-29 LAB — URINE MICROSCOPIC-ADD ON

## 2010-05-30 LAB — GLUCOSE, CAPILLARY
Glucose-Capillary: 131 mg/dL — ABNORMAL HIGH (ref 70–99)
Glucose-Capillary: 155 mg/dL — ABNORMAL HIGH (ref 70–99)
Glucose-Capillary: 160 mg/dL — ABNORMAL HIGH (ref 70–99)
Glucose-Capillary: 184 mg/dL — ABNORMAL HIGH (ref 70–99)
Glucose-Capillary: 256 mg/dL — ABNORMAL HIGH (ref 70–99)
Glucose-Capillary: 271 mg/dL — ABNORMAL HIGH (ref 70–99)
Glucose-Capillary: 358 mg/dL — ABNORMAL HIGH (ref 70–99)

## 2010-05-30 LAB — COMPREHENSIVE METABOLIC PANEL
AST: 16 U/L (ref 0–37)
Albumin: 3.4 g/dL — ABNORMAL LOW (ref 3.5–5.2)
Alkaline Phosphatase: 70 U/L (ref 39–117)
BUN: 40 mg/dL — ABNORMAL HIGH (ref 6–23)
Creatinine, Ser: 1.59 mg/dL — ABNORMAL HIGH (ref 0.4–1.2)
GFR calc non Af Amer: 34 mL/min — ABNORMAL LOW (ref >60)
Glucose, Bld: 155 mg/dL — ABNORMAL HIGH (ref 70–99)
Sodium: 140 mEq/L (ref 135–145)
Total Bilirubin: 0.6 mg/dL (ref 0.3–1.2)

## 2010-05-30 LAB — BASIC METABOLIC PANEL
BUN: 39 mg/dL — ABNORMAL HIGH (ref 6–23)
BUN: 36 mg/dL — ABNORMAL HIGH (ref 6–23)
CO2: 23 mEq/L (ref 19–32)
CO2: 22 mEq/L (ref 19–32)
Chloride: 109 mEq/L (ref 96–112)
Chloride: 105 mEq/L (ref 96–112)
Chloride: 100 mEq/L (ref 96–112)
Creatinine, Ser: 1.51 mg/dL — ABNORMAL HIGH (ref 0.4–1.2)
GFR calc Af Amer: 40 mL/min — ABNORMAL LOW (ref >60)
GFR calc non Af Amer: 36 mL/min — ABNORMAL LOW (ref >60)
Glucose, Bld: 148 mg/dL — ABNORMAL HIGH (ref 70–99)
Glucose, Bld: 318 mg/dL — ABNORMAL HIGH (ref 70–99)
Potassium: 4.2 mEq/L (ref 3.5–5.1)
Potassium: 4.5 mEq/L (ref 3.5–5.1)
Potassium: 3.8 mEq/L (ref 3.5–5.1)
Sodium: 133 mEq/L — ABNORMAL LOW (ref 135–145)

## 2010-05-30 LAB — DIFFERENTIAL
Basophils Absolute: 0 10*3/uL (ref 0.0–0.1)
Basophils Relative: 0 % (ref 0–1)
Eosinophils Absolute: 0.1 10*3/uL (ref 0.0–0.7)
Eosinophils Relative: 1 % (ref 0–5)
Lymphocytes Relative: 22 % (ref 12–46)
Monocytes Absolute: 1.3 10*3/uL — ABNORMAL HIGH (ref 0.1–1.0)

## 2010-05-30 LAB — CBC
HCT: 37.2 % (ref 36.0–46.0)
MCHC: 33.3 g/dL (ref 30.0–36.0)
Platelets: 309 10*3/uL (ref 150–400)
RDW: 13 % (ref 11.5–15.5)

## 2010-05-31 ENCOUNTER — Ambulatory Visit (HOSPITAL_COMMUNITY): Payer: PRIVATE HEALTH INSURANCE

## 2010-05-31 LAB — BRAIN NATRIURETIC PEPTIDE: Pro B Natriuretic peptide (BNP): 95 pg/mL (ref 0.0–100.0)

## 2010-05-31 LAB — GLUCOSE, CAPILLARY: Glucose-Capillary: 131 mg/dL — ABNORMAL HIGH (ref 70–99)

## 2010-05-31 LAB — COMPREHENSIVE METABOLIC PANEL
BUN: 28 mg/dL — ABNORMAL HIGH (ref 6–23)
CO2: 28 mEq/L (ref 19–32)
Calcium: 9.4 mg/dL (ref 8.4–10.5)
Chloride: 105 mEq/L (ref 96–112)
Creatinine, Ser: 1.07 mg/dL (ref 0.4–1.2)
GFR calc non Af Amer: 54 mL/min — ABNORMAL LOW (ref >60)
Total Bilirubin: 0.4 mg/dL (ref 0.3–1.2)

## 2010-05-31 LAB — CBC
MCH: 31.4 pg (ref 26.0–34.0)
MCHC: 33.4 g/dL (ref 30.0–36.0)
Platelets: 289 10*3/uL (ref 150–400)
RBC: 4.01 MIL/uL (ref 3.87–5.11)

## 2010-05-31 LAB — BASIC METABOLIC PANEL
CO2: 26 mEq/L (ref 19–32)
Calcium: 9.1 mg/dL (ref 8.4–10.5)
Creatinine, Ser: 1.38 mg/dL — ABNORMAL HIGH (ref 0.4–1.2)
GFR calc Af Amer: 49 mL/min — ABNORMAL LOW (ref >60)
GFR calc non Af Amer: 40 mL/min — ABNORMAL LOW (ref >60)
GFR calc non Af Amer: 46 mL/min — ABNORMAL LOW (ref >60)
Potassium: 4.3 mEq/L (ref 3.5–5.1)
Sodium: 137 mEq/L (ref 135–145)
Sodium: 137 mEq/L (ref 135–145)

## 2010-05-31 NOTE — Letter (Signed)
Summary: Cardiac Rehab Program  Cardiac Rehab Program   Imported By: Marylou Mccoy 05/24/2010 11:41:55  _____________________________________________________________________  External Attachment:    Type:   Image     Comment:   External Document

## 2010-05-31 NOTE — Progress Notes (Signed)
Summary: rtn call to anne  Phone Note Call from Patient   Caller: Patient [phone] Reason for Call: Talk to Nurse Summary of Call: pt rtn call to anne Initial call taken by: Glynda Jaeger,  May 25, 2010 2:01 PM  Follow-up for Phone Call        pt notified that Plavix 75mg  #90 left at front desk for her to pickup

## 2010-05-31 NOTE — Progress Notes (Signed)
  Received paper via Fax from pt asking for Records to be Sent to her Sister Suriah Peragine ) for Additional paperwork needed to Apply For Disability. Forwarded to [employer]  May 26, 2010 7:57 AM

## 2010-06-02 ENCOUNTER — Ambulatory Visit (HOSPITAL_COMMUNITY): Payer: PRIVATE HEALTH INSURANCE

## 2010-06-02 ENCOUNTER — Telehealth: Payer: Self-pay | Admitting: Cardiology

## 2010-06-02 ENCOUNTER — Telehealth (INDEPENDENT_AMBULATORY_CARE_PROVIDER_SITE_OTHER): Payer: Self-pay | Admitting: *Deleted

## 2010-06-05 ENCOUNTER — Ambulatory Visit (HOSPITAL_COMMUNITY): Payer: PRIVATE HEALTH INSURANCE

## 2010-06-05 NOTE — H&P (Signed)
[name], [name]                     ACCOUNT NO.:  [id number]  MEDICAL RECORD NO.:  [id number]           PATIENT TYPE:  I  LOCATION:  4740                         FACILITY:  [facility]  PHYSICIAN:  Geoffry Paradise, M.D.  DATE OF BIRTH:  Nov 25, 1960  DATE OF ADMISSION:  05/29/2010 DATE OF DISCHARGE:                             HISTORY & PHYSICAL   CHIEF COMPLAINT:  DKA.  HISTORY OF PRESENT ILLNESS:  [name] is a 50 year old with known cardiac history to include ASCAD/MI in November 2011, diabetes mellitus type 1 longstanding insulin, essential hypertension, hyperlipidemia, presenting at this time with nausea, vomiting, and elevated blood sugars.  She evidently has felt poorly for a few days with just fatigue leading up until this past evening.  Her blood sugar last evening she relates was in the mid 100 range, but through the night had some difficulty with at least two episodes of vomiting and thus presented to the emergency roomwith a blood sugar in excess of 500.  Despite relating relatively reasonable blood sugars in the last few days, she has had significant polyuria and polydipsia accompanying this fatigue.  She has had no sore throat, cough, congestion, fever, or abdominal symptoms.  She denies any urinary symptoms.  At this time, she has IV insulin and IV fluids ongoing and she is alert and conversant.  PAST MEDICAL HISTORY:  The patient is intolerant to erythromycin and Reglan.  MEDICATIONS: 1. Multivitamin one daily. 2. Caltrate D one daily. 3. Venlafaxine 75 mg one p.o. b.i.d. 4. Lisinopril 5 mg daily. 5. Digoxin 0.125 daily. 6. Toprol-XL 25 one half daily. 7. Alprazolam 0.5 one half to one b.i.d. 8. Aspirin 325 daily. 9. Plavix 75 mg daily. 10.Crestor 20 mg daily. 11.Humalog AC by sliding scale. 12.Lantus 30 units subcu nightly.  MEDICAL ILLNESSES: 1. Diabetes mellitus type 1, diabetic since September 1969. 2. Diabetic neuropathy, retinopathy, nephropathy. 3.  Hyperlipidemia. 4. Chronic depression. 5. Essential hypertension. 6. ASCAD, cath in 2011 with acute MI and EF 35%.  SURGICAL ILLNESSES:  Laser eye x3, appendectomy, C-section, bilateral tubal ligation, frozen shoulder.  SOCIAL HISTORY:  The patient is divorced, lives with her mother, has one child and one grandchild.  PHYSICAL EXAMINATION:  VITAL SIGNS:  Temperature is a 97.7, blood pressure 112/52, pulse 102 and regular, respiratory rate is 20 and unlabored, O2 saturation is 99%. GENERAL:  The patient is awake, alert. HEENT:  Anicteric.  Good facial symmetry.  Visual fields intact. Oropharynx, dry mucosa. NECK:  No JVD or bruits. LUNGS:  Clear. CARDIOVASCULAR:  Regular rate and rhythm, tachycardia, no murmur, gallop, rub or heave. ABDOMEN:  Soft, nontender.  Good bowel sounds. EXTREMITIES:  No cyanosis, clubbing or edema.  Intact distal pulses. Warm, no mottling.  Joints normal. NEUROLOGIC:  She is a bit drowsy, high cortical functions intact.  She is interactive.  Moves extremities x4.  Gait was not assessed.  DATA:  Glucose excess of 600.  Chemistry; sodium 134, potassium 5.4, chloride 95, CO2 is 11, glucose 628, BUN 36, creatinine 2.13, calcium 9.6.  Urinalysis; greater than 1000 glucose, large hemoglobin, greater than  80 ketones, negative leukocyte esterase, negative sediment beyond 7- 10 red cells and 0-2 white cells.  ASSESSMENT: 1. Acute diabetic ketoacidosis. 2. Acute superimposed on chronic renal failure. 3. Diabetes mellitus type 1 with associated nephropathy, retinopathy,     neuropathy. 4. Arteriosclerotic coronary artery disease with recent myocardial     infarction and ejection fraction 35%. 5. Essential hypertension. 6. Hyperlipidemia.  PLAN:  The patient admitted for IV fluids, IV insulin per DKA protocol. Further pending her course.          ______________________________ Geoffry Paradise, M.D.     RA/MEDQ  D:  05/29/2010  T:  05/30/2010  Job:   [id number]  Electronically Signed by Geoffry Paradise M.D. on 06/05/2010 09:21:35 PM

## 2010-06-05 NOTE — Discharge Summary (Signed)
NAMESolene, Hereford [name]                     ACCOUNT NO.:  [id number]  MEDICAL RECORD NO.:  [id number]           PATIENT TYPE:  I  LOCATION:  4740                         FACILITY:  [facility]  PHYSICIAN:  Tera Mater. Evlyn Kanner, M.D. DATE OF BIRTH:  04/06/1961  DATE OF ADMISSION:  05/29/2010 DATE OF DISCHARGE:  05/31/2010                              DISCHARGE SUMMARY   DISCHARGE DIAGNOSES: 1. Moderately severe diabetic ketoacidosis, clinically resolved. 2. Type 1 diabetes with chronic poor control. 3. Cardiomyopathy with no flare of congestive heart failure during     this hospitalization. 4. Hypertension, good control. 5. Hyperlipidemia, under control. 6. Depression, improved. 7. Acute renal insufficiency superimposed upon chronic kidney disease,     back to baseline. 8. Autonomic neuropathy. 9. Diabetic retinopathy. 10.Gastroparesis. 11.Anemia.  CONSULTATIONS:  None.  PROCEDURES:  IV insulin drip.  [name] is a 50 year old white female with several recent hospitalizations.  She was feeling bad for several days with poor p.o. intake, some nausea and vomiting, and elevated blood sugars.  Mostly, this was fatigue with no evidence of any fever.  She presented to Dr. Jacky Kindle and was admitted on May 29, 2010, with moderate to severe diabetic ketoacidosis with some volume deficits.  There was no clear precipitating cause of this.  Fortunately, there was no cardiac component to this hospitalization.  The patient was treated with gentle hydration and IV insulin drip.  She resolved fairly quickly and was transitioned off the drip in the early morning hours of May 30, 2010.  She was still weak and was ambulated some yesterday.  Blood sugar this morning is 131, ketones have cleared, electrolytes looked good, and she seems ready for discharge.  She has only vomited once when she first came here.  P.o. intake has been reasonable.  Blood sugars did go up some yesterday with eating.  This  morning's vital signs, blood pressure 120/74, pulse 73, respiratory rate 18, temperature 98.0, O2 sat 98%.  Her labs this morning, sodium 143, potassium 3.9, chloride 105, CO2 is 28, BUN 28, creatinine 1.07, glucose of 99, estimated GFR is 54.  Her AST is 22, ALT is 19, total protein 7, albumin 3.5, calcium 9.4.  BNP this morning is 95.  Serum ketones were negative.  This morning's white count is 10,300, hemoglobin 12.6, platelets 189,000.  At presentation, her sodium was 134, potassium 5.4, chloride 95, CO2 is 11, BUN 36, creatinine 2.16, glucose was 628, BUN was 36, creatinine 2.13, estimated GFR was 24, calcium was 9.6.  A UA was 80 mg% ketone and 1000 mg% glucose with large blood and the microscopic, however, just showed 7-10 reds and 0-2 whites.  Repeat chemistries showed that her bicarb popped up to 25, the next one 23, and then 24, so this resolved fairly quickly. A lactic acid was normal at 0.9 and repeat was normal at 1.8.  A BNP yesterday was 93.  RADIOLOGY TESTING:  A two-view chest showed mild chronic interstitial accentuation, no acute findings.  In summary, we have a 50 year old known type 1 diabetic with poor average control who presented in  diabetic ketoacidosis.  The exact cause of this is not certain, but there is no evidence of any infectious cause.  Her electrolytes are doing well.  Her white count is normal, and she seems clinically much improved.  There is no evidence any pulmonary or urinary infection.  She will return to her enalapril 2.5 once daily, her Xanax 0.5 daily, her carvedilol 3.125 at bedtime, her digoxin 0.125 daily, her Crestor 20 mg daily, her Humalog as before with 30 units of Lantus as before.  She is on an iron tablet.  She is on Lasix 20 daily, Aldactone 25 daily, a multivitamin, 325 aspirin with 75 mg Plavix, and her calcium and potassium supplements.  She is to watch her sugars carefully and report any further nausea and vomiting.  No  antibiotics are needed and no pain medications are needed.  Her diet goes back to her no concentrated sweets.          ______________________________ Tera Mater Evlyn Kanner, M.D.     SAS/MEDQ  D:  05/31/2010  T:  05/31/2010  Job:  [id number]  Electronically Signed by Adrian Prince M.D. on 06/05/2010 09:58:28 PM

## 2010-06-07 ENCOUNTER — Ambulatory Visit (HOSPITAL_COMMUNITY)
Admission: RE | Admit: 2010-06-07 | Discharge: 2010-06-07 | Disposition: A | Discharge status: Home / Self Care | Payer: PRIVATE HEALTH INSURANCE | Source: Ambulatory Visit | Attending: Cardiology | Admitting: Cardiology

## 2010-06-07 DIAGNOSIS — I2510 Atherosclerotic heart disease of native coronary artery without angina pectoris: Secondary | ICD-10-CM | POA: Insufficient documentation

## 2010-06-07 DIAGNOSIS — I252 Old myocardial infarction: Secondary | ICD-10-CM | POA: Insufficient documentation

## 2010-06-07 DIAGNOSIS — Z5189 Encounter for other specified aftercare: Secondary | ICD-10-CM | POA: Insufficient documentation

## 2010-06-08 NOTE — Progress Notes (Signed)
Summary: pt has questions  Phone Note Call from Patient   Caller: Patient [phone] Reason for Call: Talk to Nurse Summary of Call: pt calling re questions re previous heart attack Initial call taken by: Glynda Jaeger,  June 02, 2010 4:31 PM  Follow-up for Phone Call        Pt. reports she was discharged from hospital on Wednesday and has been feeling weak since then and is questioning if it is related to her medications. Hospital records show pt was hospitalized for diabetic ketoacidosis. No cardiac consult done during hospitalization and no cardiac med changes made. I told pt that weakness was probably not related to cardiac meds. Follow-up by: Dossie Arbour, RN, BSN,  June 02, 2010 5:05 PM

## 2010-06-08 NOTE — Progress Notes (Signed)
  Received ROI via fax from pt..Mailed out Echo,Cath to pt today  [facility]  June 02, 2010 9:54 AM

## 2010-06-09 ENCOUNTER — Encounter (HOSPITAL_COMMUNITY): Payer: PRIVATE HEALTH INSURANCE

## 2010-06-12 ENCOUNTER — Ambulatory Visit (HOSPITAL_COMMUNITY): Payer: PRIVATE HEALTH INSURANCE

## 2010-06-13 ENCOUNTER — Telehealth (INDEPENDENT_AMBULATORY_CARE_PROVIDER_SITE_OTHER): Payer: Self-pay | Admitting: *Deleted

## 2010-06-14 ENCOUNTER — Ambulatory Visit (HOSPITAL_COMMUNITY): Payer: PRIVATE HEALTH INSURANCE

## 2010-06-16 ENCOUNTER — Ambulatory Visit (HOSPITAL_COMMUNITY): Payer: PRIVATE HEALTH INSURANCE

## 2010-06-19 ENCOUNTER — Ambulatory Visit (HOSPITAL_COMMUNITY): Payer: PRIVATE HEALTH INSURANCE

## 2010-06-20 NOTE — Progress Notes (Signed)
  DDS Request received sent to [facility]  June 13, 2010 8:48 AM

## 2010-06-21 ENCOUNTER — Ambulatory Visit (HOSPITAL_COMMUNITY): Payer: PRIVATE HEALTH INSURANCE

## 2010-06-23 ENCOUNTER — Ambulatory Visit (HOSPITAL_COMMUNITY)
Admission: RE | Admit: 2010-06-23 | Discharge: 2010-06-23 | Disposition: A | Discharge status: Home / Self Care | Payer: PRIVATE HEALTH INSURANCE | Source: Ambulatory Visit | Attending: Cardiology | Admitting: Cardiology

## 2010-06-23 DIAGNOSIS — Z5189 Encounter for other specified aftercare: Secondary | ICD-10-CM | POA: Insufficient documentation

## 2010-06-23 DIAGNOSIS — I2510 Atherosclerotic heart disease of native coronary artery without angina pectoris: Secondary | ICD-10-CM | POA: Insufficient documentation

## 2010-06-23 DIAGNOSIS — I252 Old myocardial infarction: Secondary | ICD-10-CM | POA: Insufficient documentation

## 2010-06-25 ENCOUNTER — Inpatient Hospital Stay (HOSPITAL_COMMUNITY)
Admission: EM | Admit: 2010-06-25 | Discharge: 2010-06-29 | DRG: 637 | Disposition: A | Discharge status: Home Health | Payer: Medicaid Other | Attending: Endocrinology | Admitting: Endocrinology

## 2010-06-25 DIAGNOSIS — E1010 Type 1 diabetes mellitus with ketoacidosis without coma: Principal | ICD-10-CM | POA: Diagnosis present

## 2010-06-25 DIAGNOSIS — Z7982 Long term (current) use of aspirin: Secondary | ICD-10-CM

## 2010-06-25 DIAGNOSIS — F329 Major depressive disorder, single episode, unspecified: Secondary | ICD-10-CM | POA: Diagnosis present

## 2010-06-25 DIAGNOSIS — E1142 Type 2 diabetes mellitus with diabetic polyneuropathy: Secondary | ICD-10-CM | POA: Diagnosis present

## 2010-06-25 DIAGNOSIS — E1065 Type 1 diabetes mellitus with hyperglycemia: Secondary | ICD-10-CM | POA: Diagnosis present

## 2010-06-25 DIAGNOSIS — Z7902 Long term (current) use of antithrombotics/antiplatelets: Secondary | ICD-10-CM

## 2010-06-25 DIAGNOSIS — E11319 Type 2 diabetes mellitus with unspecified diabetic retinopathy without macular edema: Secondary | ICD-10-CM | POA: Diagnosis present

## 2010-06-25 DIAGNOSIS — N058 Unspecified nephritic syndrome with other morphologic changes: Secondary | ICD-10-CM | POA: Diagnosis present

## 2010-06-25 DIAGNOSIS — I2582 Chronic total occlusion of coronary artery: Secondary | ICD-10-CM | POA: Diagnosis present

## 2010-06-25 DIAGNOSIS — I252 Old myocardial infarction: Secondary | ICD-10-CM

## 2010-06-25 DIAGNOSIS — I10 Essential (primary) hypertension: Secondary | ICD-10-CM | POA: Diagnosis present

## 2010-06-25 DIAGNOSIS — I2510 Atherosclerotic heart disease of native coronary artery without angina pectoris: Secondary | ICD-10-CM | POA: Diagnosis present

## 2010-06-25 DIAGNOSIS — E785 Hyperlipidemia, unspecified: Secondary | ICD-10-CM | POA: Diagnosis present

## 2010-06-25 DIAGNOSIS — E1049 Type 1 diabetes mellitus with other diabetic neurological complication: Secondary | ICD-10-CM | POA: Diagnosis present

## 2010-06-25 DIAGNOSIS — Z794 Long term (current) use of insulin: Secondary | ICD-10-CM

## 2010-06-25 DIAGNOSIS — E1029 Type 1 diabetes mellitus with other diabetic kidney complication: Secondary | ICD-10-CM | POA: Diagnosis present

## 2010-06-25 DIAGNOSIS — I498 Other specified cardiac arrhythmias: Secondary | ICD-10-CM | POA: Diagnosis present

## 2010-06-25 DIAGNOSIS — I959 Hypotension, unspecified: Secondary | ICD-10-CM | POA: Diagnosis not present

## 2010-06-25 DIAGNOSIS — I2589 Other forms of chronic ischemic heart disease: Secondary | ICD-10-CM | POA: Diagnosis present

## 2010-06-25 DIAGNOSIS — F3289 Other specified depressive episodes: Secondary | ICD-10-CM | POA: Diagnosis present

## 2010-06-25 DIAGNOSIS — I214 Non-ST elevation (NSTEMI) myocardial infarction: Secondary | ICD-10-CM | POA: Diagnosis present

## 2010-06-25 DIAGNOSIS — D649 Anemia, unspecified: Secondary | ICD-10-CM | POA: Diagnosis not present

## 2010-06-25 DIAGNOSIS — E1039 Type 1 diabetes mellitus with other diabetic ophthalmic complication: Secondary | ICD-10-CM | POA: Diagnosis present

## 2010-06-25 LAB — BASIC METABOLIC PANEL
CO2: 13 mEq/L — ABNORMAL LOW (ref 19–32)
Calcium: 9.2 mg/dL (ref 8.4–10.5)
Creatinine, Ser: 2.3 mg/dL — ABNORMAL HIGH (ref 0.4–1.2)
GFR calc Af Amer: 27 mL/min — ABNORMAL LOW (ref >60)
GFR calc non Af Amer: 22 mL/min — ABNORMAL LOW (ref >60)
Sodium: 127 mEq/L — ABNORMAL LOW (ref 135–145)

## 2010-06-25 LAB — POCT I-STAT, CHEM 8
BUN: 44 mg/dL — ABNORMAL HIGH (ref 6–23)
Creatinine, Ser: 1.6 mg/dL — ABNORMAL HIGH (ref 0.4–1.2)
Glucose, Bld: >700 mg/dL (ref 70–99)
Hemoglobin: 12.6 g/dL (ref 12.0–15.0)
Potassium: 6.6 mEq/L (ref 3.5–5.1)
TCO2: 11 mmol/L (ref 0–100)

## 2010-06-25 LAB — POCT I-STAT 3, VENOUS BLOOD GAS (G3P V)
Bicarbonate: 10.9 mEq/L — ABNORMAL LOW (ref 20.0–24.0)
TCO2: 12 mmol/L (ref 0–100)
pCO2, Ven: 31.7 mmHg — ABNORMAL LOW (ref 45.0–50.0)
pH, Ven: 7.143 — CL (ref 7.250–7.300)
pO2, Ven: 34 mmHg (ref 30.0–45.0)

## 2010-06-25 LAB — URINALYSIS, ROUTINE W REFLEX MICROSCOPIC
Glucose, UA: >1000 mg/dL — AB
Hgb urine dipstick: NEGATIVE
Leukocytes, UA: NEGATIVE
Protein, ur: NEGATIVE mg/dL
Specific Gravity, Urine: 1.027 (ref 1.005–1.030)
pH: 5 (ref 5.0–8.0)

## 2010-06-25 LAB — CBC
Hemoglobin: 12.2 g/dL (ref 12.0–15.0)
MCH: 30.2 pg (ref 26.0–34.0)
Platelets: 317 10*3/uL (ref 150–400)
RBC: 4.04 MIL/uL (ref 3.87–5.11)
WBC: 12.1 10*3/uL — ABNORMAL HIGH (ref 4.0–10.5)

## 2010-06-25 LAB — CK TOTAL AND CKMB (NOT AT ARMC): Total CK: 63 U/L (ref 7–177)

## 2010-06-25 LAB — URINE MICROSCOPIC-ADD ON

## 2010-06-25 LAB — POCT CARDIAC MARKERS
Myoglobin, poc: 261 ng/mL (ref 12–200)
Troponin i, poc: <0.05 ng/mL (ref 0.00–0.09)

## 2010-06-25 LAB — DIFFERENTIAL
Basophils Relative: 0 % (ref 0–1)
Eosinophils Absolute: 0 10*3/uL (ref 0.0–0.7)
Lymphs Abs: 0.8 10*3/uL (ref 0.7–4.0)
Monocytes Relative: 2 % — ABNORMAL LOW (ref 3–12)
Neutro Abs: 11.1 10*3/uL — ABNORMAL HIGH (ref 1.7–7.7)

## 2010-06-25 LAB — GLUCOSE, CAPILLARY: Glucose-Capillary: >600 mg/dL (ref 70–99)

## 2010-06-26 ENCOUNTER — Ambulatory Visit (HOSPITAL_COMMUNITY): Payer: PRIVATE HEALTH INSURANCE

## 2010-06-26 ENCOUNTER — Inpatient Hospital Stay (HOSPITAL_COMMUNITY): Payer: Medicaid Other

## 2010-06-26 DIAGNOSIS — R112 Nausea with vomiting, unspecified: Secondary | ICD-10-CM

## 2010-06-26 DIAGNOSIS — I951 Orthostatic hypotension: Secondary | ICD-10-CM

## 2010-06-26 LAB — CARDIAC PANEL(CRET KIN+CKTOT+MB+TROPI)
Relative Index: 15.1 — ABNORMAL HIGH (ref 0.0–2.5)
Total CK: 122 U/L (ref 7–177)
Total CK: 505 U/L — ABNORMAL HIGH (ref 7–177)
Troponin I: 1.92 ng/mL (ref 0.00–0.06)

## 2010-06-26 LAB — GLUCOSE, CAPILLARY
Glucose-Capillary: >600 mg/dL (ref 70–99)
Glucose-Capillary: 516 mg/dL — ABNORMAL HIGH (ref 70–99)
Glucose-Capillary: 259 mg/dL — ABNORMAL HIGH (ref 70–99)
Glucose-Capillary: 205 mg/dL — ABNORMAL HIGH (ref 70–99)
Glucose-Capillary: 145 mg/dL — ABNORMAL HIGH (ref 70–99)
Glucose-Capillary: 106 mg/dL — ABNORMAL HIGH (ref 70–99)
Glucose-Capillary: 152 mg/dL — ABNORMAL HIGH (ref 70–99)

## 2010-06-26 LAB — BASIC METABOLIC PANEL
BUN: 50 mg/dL — ABNORMAL HIGH (ref 6–23)
BUN: 50 mg/dL — ABNORMAL HIGH (ref 6–23)
BUN: 47 mg/dL — ABNORMAL HIGH (ref 6–23)
BUN: 47 mg/dL — ABNORMAL HIGH (ref 6–23)
BUN: 46 mg/dL — ABNORMAL HIGH (ref 6–23)
BUN: 45 mg/dL — ABNORMAL HIGH (ref 6–23)
BUN: 44 mg/dL — ABNORMAL HIGH (ref 6–23)
CO2: 6 mEq/L — CL (ref 19–32)
CO2: 21 mEq/L (ref 19–32)
CO2: 21 mEq/L (ref 19–32)
CO2: 21 mEq/L (ref 19–32)
CO2: 18 mEq/L — ABNORMAL LOW (ref 19–32)
Calcium: 8.1 mg/dL — ABNORMAL LOW (ref 8.4–10.5)
Chloride: 103 mEq/L (ref 96–112)
Chloride: 108 mEq/L (ref 96–112)
Chloride: 113 mEq/L — ABNORMAL HIGH (ref 96–112)
Chloride: 116 mEq/L — ABNORMAL HIGH (ref 96–112)
Chloride: 114 mEq/L — ABNORMAL HIGH (ref 96–112)
Chloride: 109 mEq/L (ref 96–112)
Creatinine, Ser: 1.85 mg/dL — ABNORMAL HIGH (ref 0.4–1.2)
Creatinine, Ser: 1.81 mg/dL — ABNORMAL HIGH (ref 0.4–1.2)
Creatinine, Ser: 1.76 mg/dL — ABNORMAL HIGH (ref 0.4–1.2)
Creatinine, Ser: 1.72 mg/dL — ABNORMAL HIGH (ref 0.4–1.2)
GFR calc Af Amer: 26 mL/min — ABNORMAL LOW (ref >60)
GFR calc Af Amer: 30 mL/min — ABNORMAL LOW (ref >60)
GFR calc Af Amer: 38 mL/min — ABNORMAL LOW (ref >60)
GFR calc non Af Amer: 29 mL/min — ABNORMAL LOW (ref >60)
Glucose, Bld: 637 mg/dL (ref 70–99)
Glucose, Bld: 282 mg/dL — ABNORMAL HIGH (ref 70–99)
Glucose, Bld: 117 mg/dL — ABNORMAL HIGH (ref 70–99)
Glucose, Bld: 90 mg/dL (ref 70–99)
Glucose, Bld: 139 mg/dL — ABNORMAL HIGH (ref 70–99)
Glucose, Bld: 297 mg/dL — ABNORMAL HIGH (ref 70–99)
Potassium: 7.3 mEq/L (ref 3.5–5.1)
Potassium: 4.6 mEq/L (ref 3.5–5.1)
Potassium: 4.5 mEq/L (ref 3.5–5.1)
Sodium: 143 mEq/L (ref 135–145)

## 2010-06-26 LAB — CBC
HCT: 31 % — ABNORMAL LOW (ref 36.0–46.0)
MCH: 30.4 pg (ref 26.0–34.0)
MCV: 94.2 fL (ref 78.0–100.0)
RBC: 3.29 MIL/uL — ABNORMAL LOW (ref 3.87–5.11)
WBC: 17 10*3/uL — ABNORMAL HIGH (ref 4.0–10.5)

## 2010-06-26 LAB — HEPATIC FUNCTION PANEL
Albumin: 3.3 g/dL — ABNORMAL LOW (ref 3.5–5.2)
Total Bilirubin: 1.2 mg/dL (ref 0.3–1.2)
Total Protein: 6.5 g/dL (ref 6.0–8.3)

## 2010-06-26 LAB — LIPASE, BLOOD: Lipase: 35 U/L (ref 11–59)

## 2010-06-27 ENCOUNTER — Ambulatory Visit: Payer: Self-pay | Admitting: Cardiology

## 2010-06-27 DIAGNOSIS — I2510 Atherosclerotic heart disease of native coronary artery without angina pectoris: Secondary | ICD-10-CM

## 2010-06-27 LAB — BASIC METABOLIC PANEL
BUN: 42 mg/dL — ABNORMAL HIGH (ref 6–23)
CO2: 18 mEq/L — ABNORMAL LOW (ref 19–32)
CO2: 25 mEq/L (ref 19–32)
CO2: 25 mEq/L (ref 19–32)
CO2: 21 mEq/L (ref 19–32)
CO2: 23 mEq/L (ref 19–32)
Calcium: 8.6 mg/dL (ref 8.4–10.5)
Calcium: 8.6 mg/dL (ref 8.4–10.5)
Chloride: 111 mEq/L (ref 96–112)
Chloride: 112 mEq/L (ref 96–112)
Creatinine, Ser: 1.66 mg/dL — ABNORMAL HIGH (ref 0.4–1.2)
Creatinine, Ser: 1.43 mg/dL — ABNORMAL HIGH (ref 0.4–1.2)
GFR calc Af Amer: 47 mL/min — ABNORMAL LOW (ref >60)
GFR calc Af Amer: 52 mL/min — ABNORMAL LOW (ref >60)
GFR calc non Af Amer: 53 mL/min — ABNORMAL LOW (ref >60)
Glucose, Bld: 300 mg/dL — ABNORMAL HIGH (ref 70–99)
Glucose, Bld: 195 mg/dL — ABNORMAL HIGH (ref 70–99)
Glucose, Bld: 202 mg/dL — ABNORMAL HIGH (ref 70–99)
Potassium: 4.6 mEq/L (ref 3.5–5.1)
Potassium: 4 mEq/L (ref 3.5–5.1)
Potassium: 3.9 mEq/L (ref 3.5–5.1)
Sodium: 140 mEq/L (ref 135–145)
Sodium: 142 mEq/L (ref 135–145)
Sodium: 141 mEq/L (ref 135–145)
Sodium: 136 mEq/L (ref 135–145)

## 2010-06-27 LAB — COMPREHENSIVE METABOLIC PANEL
AST: 65 U/L — ABNORMAL HIGH (ref 0–37)
Albumin: 3.1 g/dL — ABNORMAL LOW (ref 3.5–5.2)
BUN: 36 mg/dL — ABNORMAL HIGH (ref 6–23)
Calcium: 8.7 mg/dL (ref 8.4–10.5)
Creatinine, Ser: 1.52 mg/dL — ABNORMAL HIGH (ref 0.4–1.2)
GFR calc Af Amer: 44 mL/min — ABNORMAL LOW (ref >60)
GFR calc non Af Amer: 36 mL/min — ABNORMAL LOW (ref >60)

## 2010-06-27 LAB — CBC
MCH: 30.2 pg (ref 26.0–34.0)
MCHC: 32.3 g/dL (ref 30.0–36.0)
Platelets: 239 10*3/uL (ref 150–400)

## 2010-06-27 LAB — GLUCOSE, CAPILLARY
Glucose-Capillary: 276 mg/dL — ABNORMAL HIGH (ref 70–99)
Glucose-Capillary: 236 mg/dL — ABNORMAL HIGH (ref 70–99)

## 2010-06-27 LAB — CARDIAC PANEL(CRET KIN+CKTOT+MB+TROPI)
CK, MB: 41.5 ng/mL (ref 0.3–4.0)
Relative Index: 14.6 — ABNORMAL HIGH (ref 0.0–2.5)
Total CK: 284 U/L — ABNORMAL HIGH (ref 7–177)

## 2010-06-27 LAB — HEPARIN LEVEL (UNFRACTIONATED): Heparin Unfractionated: 0.53 IU/mL (ref 0.30–0.70)

## 2010-06-28 ENCOUNTER — Ambulatory Visit (HOSPITAL_COMMUNITY): Payer: PRIVATE HEALTH INSURANCE

## 2010-06-28 DIAGNOSIS — I214 Non-ST elevation (NSTEMI) myocardial infarction: Secondary | ICD-10-CM

## 2010-06-28 LAB — BASIC METABOLIC PANEL
BUN: 21 mg/dL (ref 6–23)
BUN: 14 mg/dL (ref 6–23)
BUN: 15 mg/dL (ref 6–23)
CO2: 29 mEq/L (ref 19–32)
CO2: 27 mEq/L (ref 19–32)
Calcium: 8.7 mg/dL (ref 8.4–10.5)
Calcium: 9 mg/dL (ref 8.4–10.5)
Chloride: 107 mEq/L (ref 96–112)
Chloride: 107 mEq/L (ref 96–112)
Creatinine, Ser: 0.98 mg/dL (ref 0.4–1.2)
Creatinine, Ser: 1.04 mg/dL (ref 0.4–1.2)
Creatinine, Ser: 1.09 mg/dL (ref 0.4–1.2)
GFR calc Af Amer: >60 mL/min (ref >60)
GFR calc non Af Amer: 60 mL/min — ABNORMAL LOW (ref >60)
GFR calc non Af Amer: 56 mL/min — ABNORMAL LOW (ref >60)
GFR calc non Af Amer: 50 mL/min — ABNORMAL LOW (ref >60)
Glucose, Bld: 280 mg/dL — ABNORMAL HIGH (ref 70–99)
Glucose, Bld: 286 mg/dL — ABNORMAL HIGH (ref 70–99)
Glucose, Bld: 233 mg/dL — ABNORMAL HIGH (ref 70–99)
Potassium: 3.8 mEq/L (ref 3.5–5.1)
Potassium: 3.8 mEq/L (ref 3.5–5.1)
Sodium: 138 mEq/L (ref 135–145)
Sodium: 141 mEq/L (ref 135–145)
Sodium: 141 mEq/L (ref 135–145)
Sodium: 140 mEq/L (ref 135–145)

## 2010-06-28 LAB — COMPREHENSIVE METABOLIC PANEL
BUN: 17 mg/dL (ref 6–23)
Calcium: 8.5 mg/dL (ref 8.4–10.5)
Creatinine, Ser: 0.9 mg/dL (ref 0.4–1.2)
GFR calc non Af Amer: >60 mL/min (ref >60)
Glucose, Bld: 76 mg/dL (ref 70–99)
Sodium: 143 mEq/L (ref 135–145)
Total Protein: 6 g/dL (ref 6.0–8.3)

## 2010-06-28 LAB — GLUCOSE, CAPILLARY
Glucose-Capillary: 133 mg/dL — ABNORMAL HIGH (ref 70–99)
Glucose-Capillary: 198 mg/dL — ABNORMAL HIGH (ref 70–99)
Glucose-Capillary: 93 mg/dL (ref 70–99)
Glucose-Capillary: 95 mg/dL (ref 70–99)
Glucose-Capillary: 299 mg/dL — ABNORMAL HIGH (ref 70–99)

## 2010-06-28 NOTE — Consult Note (Addendum)
NAMESAHANA, [name]                     ACCOUNT NO.:  [id number]  MEDICAL RECORD NO.:  [id number]           PATIENT TYPE:  I  LOCATION:  4715                         FACILITY:  [facility]  PHYSICIAN:  Cassell Clement, M.D. DATE OF BIRTH:  05/11/1960  DATE OF CONSULTATION:  06/26/2010 DATE OF DISCHARGE:                                CONSULTATION   CHIEF COMPLAINT:  Nausea, vomiting, sleepiness, and elevate sugar  HISTORY OF PRESENT ILLNESS:  [name] is a 50 year old female with a history of coronary artery disease status post NSTEMI in December 2012 with cardiac cath at that time showing totalled LAD felt unfavorable for PCI with 70% stenosis and a small first diagonal, treated medically at that time.  She also has a history of type 1 diabetes with DKA in February 2012 as well as orthostatic hypotension causing syncope in the setting of decreased cardiac output.  Her last EF was estimated at 35% on cath March 30, 2010.  She presented to [facility] with her mother who apparently found her lying in pool of vomit.  Her mother states that her blood sugar was around 150 at 9 o'clock yesterday morning, but finding her daughter at around 5:00 p.m., the glucometer read as "high," and she was instructed by her PCP to proceed to the ER. Her blood sugar on admit was 958. She was acidotic with a pH of 7.143 and an acute renal failure with a creatinine of 2.30 with last outpatient creatinine in the 1.2 range since February.  EKG shows sinus tachycardia with mild ST depression in V5-V6, but otherwise no acute changes.  Cardiac enzymes were drawn, which demonstrated negative troponin x2, but in the third set she had an MB of 18.4  and troponin of 1.92.  She was started on the glucometer protocol and continued on aspirin and Plavix.  Beta blocker has been changed from Coreg to Toprol, and her ACE inhibitor has been held.  She had decreased blood pressure to 70/40 last night prior to the third  set of cardiac enzymes.  The patient remains somnolent, and her mother states that she has been this way over the past 3 days even in the presence of normal blood sugars. CT of head is pending.  The patient is sleepy, but arousable and cooperative with some questions, and denies any chest pain or shortness of breath whatsoever.  Her mother corroborates the story saying that the patient has had no complaints of such.  Also noted is that the vomit was typically dark colored per her mother, not coffee-ground emesis, but almost black in color.  She also has had an annular lesion on her forehead for approximately 1 month, and has been started on Lamisil here in the hospital yesterday.  PAST MEDICAL HISTORY: 1. Type 1 diabetes, with DKA in February 2012.     a.     History of gastroparesis, autonomic neuropathy, diabetic      retinopathy, and nephropathy. 2. Hypertension with history of orthostatic hypotension felt to be the     cause of syncope in December 2011 in the  setting of depressed     cardiac output. 3. Hyperlipidemia. 4. Coronary artery disease.     a.     NSTEMI March 27, 2011, with cardiac catheterization      March 31, 2011, demonstrating totalled LAD, not felt to be      acute, unfavorable for PCI with some disease in the first      diagonal, but only 2 mm vessels, felt best treated medically. 5. Ischemic cardiomyopathy with EF last assessed at 35% by cath     March 31, 2011, akinesis of the distal anterior wall, apex and     distal inferior wall.  Prior assessment was done by echo May 11, 2010, with an EF 40-45%. 6. Anemia. 7. Depression.  PAST SURGICAL HISTORY:  Laser eye surgery, appendectomy, BTL, C- sections, and frozen shoulder extension.  OUTPATIENT MEDICATIONS: 1. Enalapril 2.5 mg b.i.d. 2. Xanax 0.5 mg half tablet. 3. Coreg 3.125 mg at bedtime. 4. Digoxin 0.125 mg daily. 5. Crestor 20 mg at bedtime. 6. Humalog 10 units t.i.d. 7. Lantus 20  units b.i.d. 8. Iron 60 mg daily. 9. Lasix 20 mg daily. 10.Spironolactone 25 mg daily. 11.Multivitamin. 12.Aspirin 325 mg daily. 13.Plavix 75 mg daily. 14.Calcium carbonate/vitamin D. 15.Potassium chloride 10 mEq daily.  INPATIENT MEDICATIONS: 1. Aspirin 325 mg daily. 2. Plavix 75 mg daily. 3. Toprol 12.5 mg daily, which has been substituted for her Coreg. 4. Multivitamins. 5. Crestor 20 mg daily. 6. Lamisil 1 application b.i.d. 7. Insulin drip.  ALLERGIES:  ERYTHROMYCIN and REGLAN.  SOCIAL HISTORY:  [name] lives with her mother.  She is divorced and has one child.  She denies any tobacco or alcohol use.  FAMILY HISTORY:  Mother is alive and well.  Father died of pancreatic cancer in his 64.  No apparent premature history of coronary artery disease.  REVIEW OF SYSTEMS:  No fevers, chills, sweats, weight changes, chest pain, shortness of breath.  Urinary, hematuria, bright blood per rectum, melena, or hematemesis, but did have black vomit.  See HPI for pertinent positives.  Also noted edema of the hand.  All other systems reviewed and otherwise negative.  LABORATORY DATA:  WBC 17, hemoglobin 10, hematocrit 31, platelet count 269.  Sodium 143, potassium 3.9, chloride 113, CO2 21, glucose 282, BUN 49, creatinine 2.08.  LFTs were within normal limits with the exception of decreased albumin at 3.3 on June 26, 2010.  First set of cardiac markers show an elevated myoglobin 261, but negative MB and troponin. Second set showed an elevated MB at 9.4, but negative CK and troponin. Third set showed CK of 122, MB of 18.4, and troponin 1.92.  PH 7.143, pCO2 of 31.7, pO2 of 34.  Anion gap on admission was 23.  STUDIES:  None yet.  PHYSICAL EXAMINATION:  VITAL SIGNS:  Temperature 97.8, pulse 99, respirations 20, blood pressure 94/54 with fall to 70/40 last night, pulse ox 98% on room air. GENERAL:  This is a pale somnolent white female in no acute distress. HEENT:  Normocephalic,  atraumatic with extraocular movements intact. Clear sclerae.  Nares without discharge.  She has an annular pale erythematous lesion on her forehead without apparent scaling or separation. NECK:  Supple without carotid bruits. HEART:  Auscultation of the heart reveals regular rhythm, slightly tachycardic with S1 and S2 without murmurs, rubs, or gallops. LUNGS:  Decreased breath sounds at bases, but otherwise clear without wheezes, rales, or rhonchi. ABDOMEN:  Soft, nontender, nondistended with positive bowel  sounds. EXTREMITIES:  Warm and dry.  She has some edema of her fingers bilaterally.  No lower extremity edema.  She has mild petechiae of the finger pads.  The family correlates the petechiae with fingerstick from the glucometer, but they are apparent bilaterally. NEUROLOGIC:  She is somnolent but arousable and follows commands.  She knows her birthday and where she is.  She does not practice in the conversation verbally but instead nods her head.  ASSESSMENT/PLAN:  The patient was seen and examined by Dr. Patty Sermons and myself.  This is a 50 year old female with a history of diabetes, orthostatic hypotension, coronary artery disease as described above, renal insufficiency, as well as ischemic cardiomyopathy with an EF of 35% who presents with altered mental status and diabetic ketoacidosis. The altered mental status is of uncertain etiology, and per the patient's mother proceed abnormality of her sugars at home.  CT of the head is pending at this time.  She has been started on the glucometer protocol for diabetic ketoacidosis, and her blood sugar has fallen to less than 250 this morning, and her anion gap is no longer present.  Her creatinine is also improving from 2.3 to 2.08 this morning with fluid resuscitation.  She had hypotension last night with a blood pressure of 70/40, which may be a contributing factor to her elevated cardiac enzymes.  They are consistent with an enzyme  leak due to profound hypotension in the setting of her severe metabolic derangement and diabetic ketoacidosis.  She does have known coronary artery disease, but denies any chest pain or shortness of breath.  Her rhythm has been stable, maintaining normal sinus rhythm with no acute EKG changes.  Exam reveals clear lungs with only mild edema in her hands.  It is unclear the etiology of her petechiae fingers, although she is afebrile.  We will also defer treatment of the annular lesion to the primary care team.    From a cardiology standpoint, we would recommend to continue the patient on telemetry monitoring, continue the IV fluid, resuscitation paying full attention to her volume status given her decreased ejection fraction, and continue cycling serial cardiac enzymes.  She will be continued on her aspirin and Plavix.  There was some known doctor's progress note from today indicating the patient is allergic to PLAVIX, but the patient's family vehemently denies that she has any such reaction and has taken Plavix without interruption and therefore, will be continued on it. Dr. Patty Sermons does not feel she requires any anticoagulation at present.  We will wait for improved blood pressure before bringing some of her cardiac medications back on board.  No further ischemic workup planned at this time, pending stability of her cardiac enzymes.  We will continue to follow with you.     Ronie Spies, P.A.C.   ______________________________ Cassell Clement, M.D.   DD/MEDQ  D:  06/26/2010  T:  06/27/2010  Job:  [id number]  cc:   Marca Ancona, MD  Electronically Signed by Ronie Spies  on 06/27/2010 07:43:20 PM Electronically Signed by Cassell Clement M.D. on 06/29/2010 04:19:15 PM

## 2010-06-29 LAB — BASIC METABOLIC PANEL
BUN: 10 mg/dL (ref 6–23)
CO2: 28 mEq/L (ref 19–32)
Calcium: 8.5 mg/dL (ref 8.4–10.5)
Chloride: 108 mEq/L (ref 96–112)
Creatinine, Ser: 0.81 mg/dL (ref 0.4–1.2)
GFR calc Af Amer: >60 mL/min (ref >60)
Glucose, Bld: 96 mg/dL (ref 70–99)

## 2010-06-29 LAB — CBC
Hemoglobin: 10.3 g/dL — ABNORMAL LOW (ref 12.0–15.0)
MCH: 30.5 pg (ref 26.0–34.0)
MCHC: 32.8 g/dL (ref 30.0–36.0)
MCV: 92.9 fL (ref 78.0–100.0)
RBC: 3.38 MIL/uL — ABNORMAL LOW (ref 3.87–5.11)

## 2010-06-30 ENCOUNTER — Ambulatory Visit (HOSPITAL_COMMUNITY): Payer: PRIVATE HEALTH INSURANCE

## 2010-07-03 ENCOUNTER — Ambulatory Visit (HOSPITAL_COMMUNITY): Payer: PRIVATE HEALTH INSURANCE

## 2010-07-03 LAB — BASIC METABOLIC PANEL
BUN: 13 mg/dL (ref 6–23)
BUN: 13 mg/dL (ref 6–23)
BUN: 17 mg/dL (ref 6–23)
CO2: 26 mEq/L (ref 19–32)
CO2: 26 mEq/L (ref 19–32)
Calcium: 8.9 mg/dL (ref 8.4–10.5)
Chloride: 101 mEq/L (ref 96–112)
Chloride: 105 mEq/L (ref 96–112)
Creatinine, Ser: 0.88 mg/dL (ref 0.4–1.2)
Creatinine, Ser: 1.09 mg/dL (ref 0.4–1.2)
GFR calc Af Amer: >60 mL/min (ref >60)
GFR calc non Af Amer: >60 mL/min (ref >60)
Glucose, Bld: 156 mg/dL — ABNORMAL HIGH (ref 70–99)
Glucose, Bld: 358 mg/dL — ABNORMAL HIGH (ref 70–99)
Potassium: 3.6 mEq/L (ref 3.5–5.1)

## 2010-07-03 LAB — CBC
HCT: 24.8 % — ABNORMAL LOW (ref 36.0–46.0)
HCT: 25.2 % — ABNORMAL LOW (ref 36.0–46.0)
Hemoglobin: 9.6 g/dL — ABNORMAL LOW (ref 12.0–15.0)
MCH: 30 pg (ref 26.0–34.0)
MCH: 30.9 pg (ref 26.0–34.0)
MCHC: 32.1 g/dL (ref 30.0–36.0)
MCHC: 33.1 g/dL (ref 30.0–36.0)
MCHC: 33.7 g/dL (ref 30.0–36.0)
MCV: 91.6 fL (ref 78.0–100.0)
RBC: 3.11 MIL/uL — ABNORMAL LOW (ref 3.87–5.11)
RDW: 13.3 % (ref 11.5–15.5)
RDW: 13.5 % (ref 11.5–15.5)

## 2010-07-03 LAB — DIFFERENTIAL
Basophils Absolute: 0 10*3/uL (ref 0.0–0.1)
Basophils Relative: 1 % (ref 0–1)
Eosinophils Absolute: 0.1 10*3/uL (ref 0.0–0.7)
Lymphs Abs: 1.7 10*3/uL (ref 0.7–4.0)
Monocytes Absolute: 0.4 10*3/uL (ref 0.1–1.0)
Monocytes Absolute: 0.5 10*3/uL (ref 0.1–1.0)
Monocytes Relative: 6 % (ref 3–12)
Neutro Abs: 4.2 10*3/uL (ref 1.7–7.7)
Neutro Abs: 5.7 10*3/uL (ref 1.7–7.7)
Neutrophils Relative %: 71 % (ref 43–77)

## 2010-07-03 LAB — GLUCOSE, CAPILLARY
Glucose-Capillary: 111 mg/dL — ABNORMAL HIGH (ref 70–99)
Glucose-Capillary: 117 mg/dL — ABNORMAL HIGH (ref 70–99)
Glucose-Capillary: 141 mg/dL — ABNORMAL HIGH (ref 70–99)
Glucose-Capillary: 147 mg/dL — ABNORMAL HIGH (ref 70–99)
Glucose-Capillary: 162 mg/dL — ABNORMAL HIGH (ref 70–99)
Glucose-Capillary: 261 mg/dL — ABNORMAL HIGH (ref 70–99)
Glucose-Capillary: 263 mg/dL — ABNORMAL HIGH (ref 70–99)
Glucose-Capillary: 276 mg/dL — ABNORMAL HIGH (ref 70–99)
Glucose-Capillary: 283 mg/dL — ABNORMAL HIGH (ref 70–99)
Glucose-Capillary: 92 mg/dL (ref 70–99)

## 2010-07-03 LAB — URINE MICROSCOPIC-ADD ON

## 2010-07-03 LAB — URINALYSIS, ROUTINE W REFLEX MICROSCOPIC
Glucose, UA: >1000 mg/dL — AB
Protein, ur: NEGATIVE mg/dL
Specific Gravity, Urine: 1.033 — ABNORMAL HIGH (ref 1.005–1.030)
pH: 6 (ref 5.0–8.0)

## 2010-07-03 LAB — COMPREHENSIVE METABOLIC PANEL
ALT: 13 U/L (ref 0–35)
BUN: 12 mg/dL (ref 6–23)
Calcium: 8.5 mg/dL (ref 8.4–10.5)
Glucose, Bld: 119 mg/dL — ABNORMAL HIGH (ref 70–99)
Sodium: 138 mEq/L (ref 135–145)
Total Protein: 5.4 g/dL — ABNORMAL LOW (ref 6.0–8.3)

## 2010-07-03 LAB — TSH: TSH: 5.418 u[IU]/mL — ABNORMAL HIGH (ref 0.350–4.500)

## 2010-07-03 LAB — URINE CULTURE

## 2010-07-03 LAB — ACTH STIMULATION, 3 TIME POINTS
Cortisol, 30 Min: 30.4 ug/dL (ref >20)
Cortisol, 60 Min: 34.2 ug/dL (ref >20)

## 2010-07-04 LAB — CBC
HCT: 33.5 % — ABNORMAL LOW (ref 36.0–46.0)
HCT: 34.2 % — ABNORMAL LOW (ref 36.0–46.0)
Hemoglobin: 10.8 g/dL — ABNORMAL LOW (ref 12.0–15.0)
Hemoglobin: 11.4 g/dL — ABNORMAL LOW (ref 12.0–15.0)
Hemoglobin: 12.3 g/dL (ref 12.0–15.0)
Hemoglobin: 13.7 g/dL (ref 12.0–15.0)
Hemoglobin: 13.8 g/dL (ref 12.0–15.0)
MCH: 30.8 pg (ref 26.0–34.0)
MCH: 30.8 pg (ref 26.0–34.0)
MCH: 30.9 pg (ref 26.0–34.0)
MCH: 31.3 pg (ref 26.0–34.0)
MCH: 31.6 pg (ref 26.0–34.0)
MCHC: 32.4 g/dL (ref 30.0–36.0)
MCHC: 35 g/dL (ref 30.0–36.0)
MCHC: 35.1 g/dL (ref 30.0–36.0)
MCV: 89.9 fL (ref 78.0–100.0)
MCV: 90.8 fL (ref 78.0–100.0)
MCV: 93.9 fL (ref 78.0–100.0)
MCV: 94.5 fL (ref 78.0–100.0)
MCV: 95.1 fL (ref 78.0–100.0)
Platelets: 149 10*3/uL — ABNORMAL LOW (ref 150–400)
Platelets: 169 10*3/uL (ref 150–400)
Platelets: 218 10*3/uL (ref 150–400)
Platelets: 228 10*3/uL (ref 150–400)
Platelets: 257 10*3/uL (ref 150–400)
Platelets: 414 10*3/uL — ABNORMAL HIGH (ref 150–400)
Platelets: 466 10*3/uL — ABNORMAL HIGH (ref 150–400)
RBC: 3.44 MIL/uL — ABNORMAL LOW (ref 3.87–5.11)
RBC: 3.45 MIL/uL — ABNORMAL LOW (ref 3.87–5.11)
RBC: 3.5 MIL/uL — ABNORMAL LOW (ref 3.87–5.11)
RBC: 3.9 MIL/uL (ref 3.87–5.11)
RBC: 4.41 MIL/uL (ref 3.87–5.11)
RDW: 12.8 % (ref 11.5–15.5)
RDW: 12.9 % (ref 11.5–15.5)
RDW: 13 % (ref 11.5–15.5)
RDW: 13.2 % (ref 11.5–15.5)
WBC: 10.5 10*3/uL (ref 4.0–10.5)
WBC: 14.6 10*3/uL — ABNORMAL HIGH (ref 4.0–10.5)
WBC: 6.7 10*3/uL (ref 4.0–10.5)
WBC: 9.6 10*3/uL (ref 4.0–10.5)
WBC: 9.8 10*3/uL (ref 4.0–10.5)
WBC: 9.9 10*3/uL (ref 4.0–10.5)

## 2010-07-04 LAB — GLUCOSE, CAPILLARY
Glucose-Capillary: 102 mg/dL — ABNORMAL HIGH (ref 70–99)
Glucose-Capillary: 114 mg/dL — ABNORMAL HIGH (ref 70–99)
Glucose-Capillary: 114 mg/dL — ABNORMAL HIGH (ref 70–99)
Glucose-Capillary: 116 mg/dL — ABNORMAL HIGH (ref 70–99)
Glucose-Capillary: 122 mg/dL — ABNORMAL HIGH (ref 70–99)
Glucose-Capillary: 133 mg/dL — ABNORMAL HIGH (ref 70–99)
Glucose-Capillary: 135 mg/dL — ABNORMAL HIGH (ref 70–99)
Glucose-Capillary: 137 mg/dL — ABNORMAL HIGH (ref 70–99)
Glucose-Capillary: 137 mg/dL — ABNORMAL HIGH (ref 70–99)
Glucose-Capillary: 139 mg/dL — ABNORMAL HIGH (ref 70–99)
Glucose-Capillary: 139 mg/dL — ABNORMAL HIGH (ref 70–99)
Glucose-Capillary: 144 mg/dL — ABNORMAL HIGH (ref 70–99)
Glucose-Capillary: 151 mg/dL — ABNORMAL HIGH (ref 70–99)
Glucose-Capillary: 154 mg/dL — ABNORMAL HIGH (ref 70–99)
Glucose-Capillary: 157 mg/dL — ABNORMAL HIGH (ref 70–99)
Glucose-Capillary: 157 mg/dL — ABNORMAL HIGH (ref 70–99)
Glucose-Capillary: 158 mg/dL — ABNORMAL HIGH (ref 70–99)
Glucose-Capillary: 160 mg/dL — ABNORMAL HIGH (ref 70–99)
Glucose-Capillary: 160 mg/dL — ABNORMAL HIGH (ref 70–99)
Glucose-Capillary: 161 mg/dL — ABNORMAL HIGH (ref 70–99)
Glucose-Capillary: 165 mg/dL — ABNORMAL HIGH (ref 70–99)
Glucose-Capillary: 166 mg/dL — ABNORMAL HIGH (ref 70–99)
Glucose-Capillary: 172 mg/dL — ABNORMAL HIGH (ref 70–99)
Glucose-Capillary: 172 mg/dL — ABNORMAL HIGH (ref 70–99)
Glucose-Capillary: 174 mg/dL — ABNORMAL HIGH (ref 70–99)
Glucose-Capillary: 176 mg/dL — ABNORMAL HIGH (ref 70–99)
Glucose-Capillary: 193 mg/dL — ABNORMAL HIGH (ref 70–99)
Glucose-Capillary: 193 mg/dL — ABNORMAL HIGH (ref 70–99)
Glucose-Capillary: 200 mg/dL — ABNORMAL HIGH (ref 70–99)
Glucose-Capillary: 212 mg/dL — ABNORMAL HIGH (ref 70–99)
Glucose-Capillary: 215 mg/dL — ABNORMAL HIGH (ref 70–99)
Glucose-Capillary: 219 mg/dL — ABNORMAL HIGH (ref 70–99)
Glucose-Capillary: 221 mg/dL — ABNORMAL HIGH (ref 70–99)
Glucose-Capillary: 221 mg/dL — ABNORMAL HIGH (ref 70–99)
Glucose-Capillary: 228 mg/dL — ABNORMAL HIGH (ref 70–99)
Glucose-Capillary: 230 mg/dL — ABNORMAL HIGH (ref 70–99)
Glucose-Capillary: 232 mg/dL — ABNORMAL HIGH (ref 70–99)
Glucose-Capillary: 238 mg/dL — ABNORMAL HIGH (ref 70–99)
Glucose-Capillary: 243 mg/dL — ABNORMAL HIGH (ref 70–99)
Glucose-Capillary: 252 mg/dL — ABNORMAL HIGH (ref 70–99)
Glucose-Capillary: 253 mg/dL — ABNORMAL HIGH (ref 70–99)
Glucose-Capillary: 257 mg/dL — ABNORMAL HIGH (ref 70–99)
Glucose-Capillary: 259 mg/dL — ABNORMAL HIGH (ref 70–99)
Glucose-Capillary: 261 mg/dL — ABNORMAL HIGH (ref 70–99)
Glucose-Capillary: 261 mg/dL — ABNORMAL HIGH (ref 70–99)
Glucose-Capillary: 265 mg/dL — ABNORMAL HIGH (ref 70–99)
Glucose-Capillary: 276 mg/dL — ABNORMAL HIGH (ref 70–99)
Glucose-Capillary: 277 mg/dL — ABNORMAL HIGH (ref 70–99)
Glucose-Capillary: 279 mg/dL — ABNORMAL HIGH (ref 70–99)
Glucose-Capillary: 279 mg/dL — ABNORMAL HIGH (ref 70–99)
Glucose-Capillary: 283 mg/dL — ABNORMAL HIGH (ref 70–99)
Glucose-Capillary: 285 mg/dL — ABNORMAL HIGH (ref 70–99)
Glucose-Capillary: 299 mg/dL — ABNORMAL HIGH (ref 70–99)
Glucose-Capillary: 326 mg/dL — ABNORMAL HIGH (ref 70–99)
Glucose-Capillary: 359 mg/dL — ABNORMAL HIGH (ref 70–99)
Glucose-Capillary: 359 mg/dL — ABNORMAL HIGH (ref 70–99)
Glucose-Capillary: 393 mg/dL — ABNORMAL HIGH (ref 70–99)
Glucose-Capillary: 446 mg/dL — ABNORMAL HIGH (ref 70–99)
Glucose-Capillary: 470 mg/dL — ABNORMAL HIGH (ref 70–99)
Glucose-Capillary: 569 mg/dL (ref 70–99)
Glucose-Capillary: 81 mg/dL (ref 70–99)
Glucose-Capillary: 86 mg/dL (ref 70–99)
Glucose-Capillary: 99 mg/dL (ref 70–99)

## 2010-07-04 LAB — DIFFERENTIAL
Basophils Absolute: 0 10*3/uL (ref 0.0–0.1)
Lymphocytes Relative: 18 % (ref 12–46)
Lymphocytes Relative: 4 % — ABNORMAL LOW (ref 12–46)
Lymphs Abs: 1.8 10*3/uL (ref 0.7–4.0)
Monocytes Absolute: 0.7 10*3/uL (ref 0.1–1.0)
Monocytes Relative: 7 % (ref 3–12)
Neutro Abs: 13.4 10*3/uL — ABNORMAL HIGH (ref 1.7–7.7)
Neutro Abs: 7.4 10*3/uL (ref 1.7–7.7)
Neutrophils Relative %: 75 % (ref 43–77)

## 2010-07-04 LAB — BASIC METABOLIC PANEL
BUN: 10 mg/dL (ref 6–23)
BUN: 10 mg/dL (ref 6–23)
BUN: 11 mg/dL (ref 6–23)
BUN: 11 mg/dL (ref 6–23)
BUN: 13 mg/dL (ref 6–23)
BUN: 8 mg/dL (ref 6–23)
BUN: 8 mg/dL (ref 6–23)
BUN: 8 mg/dL (ref 6–23)
BUN: 9 mg/dL (ref 6–23)
CO2: 20 mEq/L (ref 19–32)
CO2: 21 mEq/L (ref 19–32)
CO2: 22 mEq/L (ref 19–32)
CO2: 24 mEq/L (ref 19–32)
CO2: 24 mEq/L (ref 19–32)
CO2: 27 mEq/L (ref 19–32)
CO2: 28 mEq/L (ref 19–32)
CO2: 8 mEq/L — CL (ref 19–32)
Calcium: 7.5 mg/dL — ABNORMAL LOW (ref 8.4–10.5)
Calcium: 7.8 mg/dL — ABNORMAL LOW (ref 8.4–10.5)
Calcium: 7.8 mg/dL — ABNORMAL LOW (ref 8.4–10.5)
Calcium: 7.9 mg/dL — ABNORMAL LOW (ref 8.4–10.5)
Calcium: 8 mg/dL — ABNORMAL LOW (ref 8.4–10.5)
Calcium: 8 mg/dL — ABNORMAL LOW (ref 8.4–10.5)
Calcium: 8.3 mg/dL — ABNORMAL LOW (ref 8.4–10.5)
Calcium: 8.4 mg/dL (ref 8.4–10.5)
Calcium: 8.6 mg/dL (ref 8.4–10.5)
Calcium: 8.9 mg/dL (ref 8.4–10.5)
Chloride: 103 mEq/L (ref 96–112)
Chloride: 105 mEq/L (ref 96–112)
Chloride: 106 mEq/L (ref 96–112)
Chloride: 110 mEq/L (ref 96–112)
Chloride: 113 mEq/L — ABNORMAL HIGH (ref 96–112)
Chloride: 113 mEq/L — ABNORMAL HIGH (ref 96–112)
Chloride: 118 mEq/L — ABNORMAL HIGH (ref 96–112)
Creatinine, Ser: 1.03 mg/dL (ref 0.4–1.2)
Creatinine, Ser: 1.03 mg/dL (ref 0.4–1.2)
Creatinine, Ser: 1.1 mg/dL (ref 0.4–1.2)
Creatinine, Ser: 1.12 mg/dL (ref 0.4–1.2)
Creatinine, Ser: 1.13 mg/dL (ref 0.4–1.2)
Creatinine, Ser: 1.14 mg/dL (ref 0.4–1.2)
Creatinine, Ser: 1.16 mg/dL (ref 0.4–1.2)
Creatinine, Ser: 1.21 mg/dL — ABNORMAL HIGH (ref 0.4–1.2)
Creatinine, Ser: 1.22 mg/dL — ABNORMAL HIGH (ref 0.4–1.2)
Creatinine, Ser: 1.24 mg/dL — ABNORMAL HIGH (ref 0.4–1.2)
Creatinine, Ser: 1.34 mg/dL — ABNORMAL HIGH (ref 0.4–1.2)
Creatinine, Ser: 1.72 mg/dL — ABNORMAL HIGH (ref 0.4–1.2)
GFR calc Af Amer: 36 mL/min — ABNORMAL LOW (ref >60)
GFR calc Af Amer: 38 mL/min — ABNORMAL LOW (ref >60)
GFR calc Af Amer: 50 mL/min — ABNORMAL LOW (ref >60)
GFR calc Af Amer: 55 mL/min — ABNORMAL LOW (ref >60)
GFR calc Af Amer: 56 mL/min — ABNORMAL LOW (ref >60)
GFR calc Af Amer: 57 mL/min — ABNORMAL LOW (ref >60)
GFR calc Af Amer: 60 mL/min — ABNORMAL LOW (ref >60)
GFR calc Af Amer: >60 mL/min (ref >60)
GFR calc Af Amer: >60 mL/min (ref >60)
GFR calc Af Amer: >60 mL/min (ref >60)
GFR calc Af Amer: >60 mL/min (ref >60)
GFR calc Af Amer: >60 mL/min (ref >60)
GFR calc non Af Amer: 30 mL/min — ABNORMAL LOW (ref >60)
GFR calc non Af Amer: 31 mL/min — ABNORMAL LOW (ref >60)
GFR calc non Af Amer: 42 mL/min — ABNORMAL LOW (ref >60)
GFR calc non Af Amer: 46 mL/min — ABNORMAL LOW (ref >60)
GFR calc non Af Amer: 49 mL/min — ABNORMAL LOW (ref >60)
GFR calc non Af Amer: 51 mL/min — ABNORMAL LOW (ref >60)
GFR calc non Af Amer: 52 mL/min — ABNORMAL LOW (ref >60)
GFR calc non Af Amer: 53 mL/min — ABNORMAL LOW (ref >60)
GFR calc non Af Amer: 55 mL/min — ABNORMAL LOW (ref >60)
GFR calc non Af Amer: 56 mL/min — ABNORMAL LOW (ref >60)
GFR calc non Af Amer: >60 mL/min (ref >60)
Glucose, Bld: 159 mg/dL — ABNORMAL HIGH (ref 70–99)
Glucose, Bld: 178 mg/dL — ABNORMAL HIGH (ref 70–99)
Glucose, Bld: 226 mg/dL — ABNORMAL HIGH (ref 70–99)
Glucose, Bld: 255 mg/dL — ABNORMAL HIGH (ref 70–99)
Glucose, Bld: 426 mg/dL — ABNORMAL HIGH (ref 70–99)
Glucose, Bld: 531 mg/dL — ABNORMAL HIGH (ref 70–99)
Potassium: 3.1 mEq/L — ABNORMAL LOW (ref 3.5–5.1)
Potassium: 3.4 mEq/L — ABNORMAL LOW (ref 3.5–5.1)
Potassium: 3.6 mEq/L (ref 3.5–5.1)
Potassium: 3.8 mEq/L (ref 3.5–5.1)
Potassium: 4 mEq/L (ref 3.5–5.1)
Potassium: 4.1 mEq/L (ref 3.5–5.1)
Sodium: 136 mEq/L (ref 135–145)
Sodium: 138 mEq/L (ref 135–145)
Sodium: 138 mEq/L (ref 135–145)
Sodium: 139 mEq/L (ref 135–145)
Sodium: 139 mEq/L (ref 135–145)
Sodium: 140 mEq/L (ref 135–145)
Sodium: 140 mEq/L (ref 135–145)
Sodium: 141 mEq/L (ref 135–145)
Sodium: 141 mEq/L (ref 135–145)
Sodium: 145 mEq/L (ref 135–145)
Sodium: 146 mEq/L — ABNORMAL HIGH (ref 135–145)

## 2010-07-04 LAB — HEPARIN LEVEL (UNFRACTIONATED)
Heparin Unfractionated: 0.21 IU/mL — ABNORMAL LOW (ref 0.30–0.70)
Heparin Unfractionated: 0.24 IU/mL — ABNORMAL LOW (ref 0.30–0.70)
Heparin Unfractionated: 0.36 IU/mL (ref 0.30–0.70)

## 2010-07-04 LAB — MAGNESIUM
Magnesium: 1.5 mg/dL (ref 1.5–2.5)
Magnesium: 1.6 mg/dL (ref 1.5–2.5)
Magnesium: 1.6 mg/dL (ref 1.5–2.5)
Magnesium: 1.7 mg/dL (ref 1.5–2.5)
Magnesium: 1.8 mg/dL (ref 1.5–2.5)
Magnesium: 1.8 mg/dL (ref 1.5–2.5)

## 2010-07-04 LAB — CARBOXYHEMOGLOBIN
O2 Saturation: 54.6 %
Total hemoglobin: 11.6 g/dL — ABNORMAL LOW (ref 12.5–16.0)

## 2010-07-04 LAB — LIPID PANEL
HDL: 44 mg/dL (ref >39)
Total CHOL/HDL Ratio: 2.2 RATIO
Triglycerides: 110 mg/dL (ref <150)

## 2010-07-04 LAB — COMPREHENSIVE METABOLIC PANEL
Albumin: 2.4 g/dL — ABNORMAL LOW (ref 3.5–5.2)
Albumin: 3.9 g/dL (ref 3.5–5.2)
BUN: 18 mg/dL (ref 6–23)
BUN: 20 mg/dL (ref 6–23)
CO2: 10 mEq/L — ABNORMAL LOW (ref 19–32)
CO2: 29 mEq/L (ref 19–32)
Calcium: 10.1 mg/dL (ref 8.4–10.5)
Calcium: 8.4 mg/dL (ref 8.4–10.5)
Chloride: 105 mEq/L (ref 96–112)
Chloride: 119 mEq/L — ABNORMAL HIGH (ref 96–112)
Creatinine, Ser: 1.49 mg/dL — ABNORMAL HIGH (ref 0.4–1.2)
Creatinine, Ser: 1.73 mg/dL — ABNORMAL HIGH (ref 0.4–1.2)
GFR calc Af Amer: >60 mL/min (ref >60)
GFR calc non Af Amer: 31 mL/min — ABNORMAL LOW (ref >60)
Glucose, Bld: 130 mg/dL — ABNORMAL HIGH (ref 70–99)
Potassium: 4.5 mEq/L (ref 3.5–5.1)
Sodium: 141 mEq/L (ref 135–145)
Total Bilirubin: 1.2 mg/dL (ref 0.3–1.2)
Total Protein: 8 g/dL (ref 6.0–8.3)

## 2010-07-04 LAB — CARDIAC PANEL(CRET KIN+CKTOT+MB+TROPI)
CK, MB: 13 ng/mL (ref 0.3–4.0)
Relative Index: 6.4 — ABNORMAL HIGH (ref 0.0–2.5)
Total CK: 203 U/L — ABNORMAL HIGH (ref 7–177)
Troponin I: 10.06 ng/mL (ref 0.00–0.06)
Troponin I: 12.22 ng/mL (ref 0.00–0.06)
Troponin I: 9.44 ng/mL (ref 0.00–0.06)

## 2010-07-04 LAB — BRAIN NATRIURETIC PEPTIDE
Pro B Natriuretic peptide (BNP): 324 pg/mL — ABNORMAL HIGH (ref 0.0–100.0)
Pro B Natriuretic peptide (BNP): 420 pg/mL — ABNORMAL HIGH (ref 0.0–100.0)
Pro B Natriuretic peptide (BNP): 648 pg/mL — ABNORMAL HIGH (ref 0.0–100.0)

## 2010-07-04 LAB — URINE MICROSCOPIC-ADD ON

## 2010-07-04 LAB — URINALYSIS, ROUTINE W REFLEX MICROSCOPIC
Glucose, UA: >1000 mg/dL — AB
Hgb urine dipstick: NEGATIVE
Leukocytes, UA: NEGATIVE
Specific Gravity, Urine: 1.031 — ABNORMAL HIGH (ref 1.005–1.030)

## 2010-07-04 LAB — PROTIME-INR: Prothrombin Time: 14.3 seconds (ref 11.6–15.2)

## 2010-07-04 LAB — HEPATIC FUNCTION PANEL
Albumin: 2.4 g/dL — ABNORMAL LOW (ref 3.5–5.2)
Alkaline Phosphatase: 102 U/L (ref 39–117)
Indirect Bilirubin: 0.8 mg/dL (ref 0.3–0.9)
Total Protein: 5.8 g/dL — ABNORMAL LOW (ref 6.0–8.3)

## 2010-07-04 LAB — PHOSPHORUS
Phosphorus: 1.6 mg/dL — ABNORMAL LOW (ref 2.3–4.6)
Phosphorus: 1.7 mg/dL — ABNORMAL LOW (ref 2.3–4.6)
Phosphorus: 2.3 mg/dL (ref 2.3–4.6)

## 2010-07-05 ENCOUNTER — Ambulatory Visit (HOSPITAL_COMMUNITY): Payer: PRIVATE HEALTH INSURANCE

## 2010-07-07 ENCOUNTER — Ambulatory Visit (HOSPITAL_COMMUNITY): Payer: PRIVATE HEALTH INSURANCE

## 2010-07-09 NOTE — H&P (Signed)
NAMEVICTORIANA, [name]                     ACCOUNT NO.:  [id number]  MEDICAL RECORD NO.:  [id number]           PATIENT TYPE:  I  LOCATION:  4715                         FACILITY:  [facility]  PHYSICIAN:  Gaspar Garbe, M.D.DATE OF BIRTH:  11-11-1960  DATE OF ADMISSION:  06/25/2010 DATE OF DISCHARGE:                             HISTORY & PHYSICAL   CHIEF COMPLAINT:  Nausea, vomiting, high sugars.  HISTORY OF PRESENT ILLNESS:  The patient is a 50 year old white female with a history of diabetic ketoacidosis 3 weeks ago, which required admission as well.  She had coronary artery disease and stenting done in November 2011.  She indicates that she has not been feeling well for the past couple of days. Most of her history is given through her mother as she is not very talkative.  Mother indicates that she has been somewhat lethargic, tired.  Mother is certain that her blood sugar was in 120s this morning when she woke up, that she took her 10 units of Lantus that she normally does, was able to eat.  After that point, their history became somewhat fragmented insisting that she has taken sliding scale at lunch.  They cannot tell me what her blood sugar was at lunch or how much sliding scale she had.  She states she felt bad with nausea and vomiting and was found to have a blood sugar that was unbeatable on home meter.  He called Dr. Evlyn Kanner who has been the on-call doctor in the emergency room.  Noted at the emergency room, her blood glucose was greater than 700 __________  The patient denies any chest pain and occasional nausea and vomiting and increased urination, but just generally felt tired over the past couple of days.  ALLERGIES:  ERYTHROMYCIN and REGLAN.  MEDICATIONS: 1. Lantus 10 units subcu twice daily. 2. Humalog sliding scale insulin. 3. Crestor 20 mg daily. 4. Toprol-XL 12.5 mg daily. 5. Lisinopril 5 mg daily. 6. Aspirin 325 mg daily. 7. Plavix 75 mg daily. 8. Effexor 75 mg  twice daily, this is missing from her home list     though was on her discharge list 3 weeks ago and was also present     in her office chart. 9. Digoxin 0.25 mg daily.  PAST MEDICAL HISTORY: 1. Diabetes mellitus type 1, triopathy.  Of note, the patient has had     diabetic ketoacidosis in early February, required admission. 2. Hypertension. 3. Hyperlipidemia. 4. Coronary artery disease status post stent in November 2011,     followed by Dr. Shirlee Latch with [facility]. 5. History of anemia. 6. Depression. 7. Gastroparesis.  SURGERIES: 1. Laser eye surgeries x3. 2. Appendectomy. 3. Bilateral tubal ligation. 4. C-section. 5. Frozen shoulder surgery.  SOCIAL HISTORY:  The patient lives with her mother.  She does not work. She is divorced, with 1 child.  Nonsmoker, nondrinker.  FAMILY HISTORY:  Positive for pancreatic cancer.  REVIEW OF SYSTEMS:  Nausea, vomiting, and general fatigue and lethargy as well as increased urination, otherwise negative for chest pain, shortness of breath, exertion and negative across the  10-point scale.  ADVANCED DIRECTIVES:  The patient is a full code.  PHYSICAL EXAMINATION:  VITAL SIGNS:  Temperature 98.2, pulse 114, respiratory rate 18, blood pressure 96/50, satting 98% on room air. GENERAL:  The patient is ill appearing, very talkative. HEENT:  Normocephalic, atraumatic.  PERRLA.  EOMI.  ENT is within normal limits. NECK:  Supple.  No lymphadenopathy, JVD, or bruit. HEART:  Tachycardic.  No murmur appreciated. LUNGS:  Clear to auscultation bilaterally. ABDOMEN:  Soft, nontender.  Normoactive bowel sounds.  Noted abscess at the ejection site. EXTREMITIES:  No clubbing, cyanosis, or edema. NEUROLOGIC:  The patient is oriented to person, place, and time.  As stated above, lethargic and does not have any lateralizing signs.  EKG shows sinus tachycardia, but otherwise within normal limits compared to her prior EKG 3 weeks ago.  LABORATORY  DATA:  White count 12.1 with a left shift, 91% neutrophils, hemoglobin 12.3, hematocrit 40, platelets 217, BUN and creatinine are elevated at 47 and 2.3 respectively.  Sodium decreased to 127, potassium elevated at 6, bicarbonate 13, and anion gap.  Cardiac enzymes show a myoglobin of 280, CK-MB fraction of 8, and troponin less than 0.05. Urinalysis is positive for glucose and ketones, but negative for leukocyte esterase, nitrites.  Her ABG shows a pH of 7.1 with low bicarb and anion gap is noted.  ASSESSMENT AND PLAN: 1. Diabetic ketoacidosis.  We will admit her to the step-down unit, IV     hydration and Glucommander protocol.  Potassium was elevated, but     this was down with administration of insulin.  She will receive     another set of labs in the morning which is only 6-8 hours from now     in time.  She will also receive cardiac enzymes q.6 h x2 and EKG in     the morning to further rule out worsening heart disease as a cause     of her DKA.  I am a little bit concerned that she may have missed     several of her shots today as she was not very forthcoming.  Her     doses were repeated at lunchtime.  I would like to have seen her     meter, but they did not bring it with her to verify her actual     readings from this morning.  She does not appear to have any     respiratory or urinary tract infection causing her to be worsened     DKA. 2. Diabetes mellitus type 1 with triopathy.  As noted above. 3. Coronary artery disease.  We will continue her on her aspirin,     Plavix, and Crestor.  We will hold her lisinopril because of her     elevated kidney function and low blood pressure, continue her beta-     blocker which we just began in the morning.  We will hold her blood     pressure in response to IV fluids as noted above. 4. Hypertension.  Noted above. 5. Hyperlipidemia.  Noted above. 6. Depression.  Given her frequent returns to the hospital, I wonder     if there might be  a psychosocial reason for her having frequent     episodes of DKA as well.  Dr. Evlyn Kanner is going to be here this     morning as he knows the patient well.     Gaspar Garbe, M.D.     RWT/MEDQ  D:  06/25/2010  T:  06/26/2010  Job:  [id number]  cc:   Tera Mater. Evlyn Kanner, M.D. Marca Ancona, MD  Electronically Signed by Guerry Bruin M.D. on 07/09/2010 02:21:42 PM

## 2010-07-10 ENCOUNTER — Ambulatory Visit (HOSPITAL_COMMUNITY): Payer: PRIVATE HEALTH INSURANCE

## 2010-07-10 ENCOUNTER — Encounter (INDEPENDENT_AMBULATORY_CARE_PROVIDER_SITE_OTHER): Payer: Self-pay | Admitting: Physician Assistant

## 2010-07-10 ENCOUNTER — Encounter: Payer: Self-pay | Admitting: Physician Assistant

## 2010-07-10 ENCOUNTER — Encounter: Payer: PRIVATE HEALTH INSURANCE | Admitting: Physician Assistant

## 2010-07-10 DIAGNOSIS — I2510 Atherosclerotic heart disease of native coronary artery without angina pectoris: Secondary | ICD-10-CM

## 2010-07-10 DIAGNOSIS — I5022 Chronic systolic (congestive) heart failure: Secondary | ICD-10-CM

## 2010-07-10 DIAGNOSIS — R5383 Other fatigue: Secondary | ICD-10-CM | POA: Insufficient documentation

## 2010-07-10 DIAGNOSIS — Z780 Asymptomatic menopausal state: Secondary | ICD-10-CM | POA: Insufficient documentation

## 2010-07-10 DIAGNOSIS — R5381 Other malaise: Secondary | ICD-10-CM | POA: Insufficient documentation

## 2010-07-10 NOTE — Discharge Summary (Signed)
NAMEJood, [name]                     ACCOUNT NO.:  [id number]  MEDICAL RECORD NO.:  [id number]           PATIENT TYPE:  I  LOCATION:  4715                         FACILITY:  [facility]  PHYSICIAN:  Tera Mater. Evlyn Kanner, M.D. DATE OF BIRTH:  12-Aug-1960  DATE OF ADMISSION:  06/25/2010 DATE OF DISCHARGE:  06/29/2010                              DISCHARGE SUMMARY   DISCHARGE DIAGNOSES: 1. Acute myocardial infarction, with stable cardiac catheterization. 2. Severe diabetic ketoacidosis, likely precipitated by myocardial     infarction. 3. Altered mental status, now clinically improved. 4. Known cardiomyopathy with stable ejection fraction. 5. Diabetic nephropathy with worsening in acute renal failure. 6. Relative hypotension, now improved. 7. Hyperlipidemia. 8. Diabetic retinopathy. 9. Diabetic neuropathy. 10.Diabetic nephropathy. 11.Depression. 12.Anemia.  Consultations were with [facility].  Procedures included a cardiac catheterization and a CT of the head.  [name] is a 50 year old white female longstanding patient of my practice who presented to my partner, Dr. Guerry Bruin, on June 25, 2010.  She had a tough weekend with weakness and lethargy and altered mental status.  She came in with the sugars approaching 1000, low bicarbonate, all consistent with severe diabetic ketoacidosis.  In retrospect, it appears that this was an episode likely precipitated by a myocardial infarction as evidenced by very high troponins.  The patient's DKA resolved fairly quickly with IV Glucommander and appropriate fluids administration.  Her electrolytes stayed in reasonable control.  She had no significant swings to any dangerous levels.  Her acidosis and gap closed fairly quickly.  She had some relative hypotension initially, this has resolved.  She had some somnolence and altered mental status which prompted a CT scan showed no acute changes as we noted below.  With the positive cardiac  enzymes, the cardiac catheterization was undertaken on June 27, 2010, and basically showed no significant change in the anatomy since December.  There was likely a perforated branch or something that had closed off that caused this myocardial infarction.  The patient now is doing well.  She walked every hour during the day yesterday.  She is tolerating the diet well. She has had no fluid overload issues.  She is feeling back pretty much to her normal self.  Blood pressure is fine this morning at 111/69, pulse 69, respiration 16, temperature 98.1, O2 sat on room air is 99% fasting blood sugar is 95.  I think we can let her go home today.  Her BMET this morning, sodium 142, potassium 3.7, chloride 108, CO2 28, BUN 10, creatinine 0.81, glucose of 96, estimated GFR greater than 60, calcium 8.5.  This morning's white count is 6800, hemoglobin 10.3, MCV 92.9, platelets 200,000.  At presentation, she had a troponin that was initially less than 0.05, myoglobin point of care of 261, a blood gas with pH 7.143, pCO2 of 32, pO2 of 34 venous, total CO2 was 12.  A urinalysis showed 80 mg ketones and 1000 mg glucose, white count was 0- 2.  We should note that our hospital has inadequate supply of serum ketone testing and that test could not be  done despite its importance in this case.  White count is 12,100, hemoglobin 12.2, platelets 317,000. Initial chemistries, sodium 127, potassium 6.6, chloride 91, CO2 13, BUN 47, creatinine 2.30, glucose 958.  Estimated GFR of 22 with a calcium of 9.2.  Second CK showed MB of 9.4.  Second troponin was 0.03, after this it went up to 1.92 for the troponin, it peaked at 24.72 with a CK of 505, MB of 68.7, relative index of 13.6.  BNP was elevated at 223 on June 26, 2010,  RADIOLOGY TESTING:  A CT of the head done for altered mental status showed intermittently motion degraded no acute intracranial abnormality. Chest x-ray does not appear to have been done.  EKG was  sinus rhythm with poor R-wave progression, loss of anterior forces, and flat T-waves throughout.  She was in a sinus rhythm.  Her cardiac cath results, LAD was 100% occluded in the mid vessel, faint right to left collaterals, high take off the first diagonal with 70% to 80% diffuse disease as seen previously, left dominant CERT with 95% tubular disease versus second obtuse marginals were 30% to 40% discrete lesions, two posterolateral branches in the PDA that were small without critical disease.  Right coronary was small and nondominant.  EF was 35%.  LV pressure was 108/50, the aortic pressure was 107/54.  In summary, we have a complex admission for a 50 year old white female presenting with severe TKA in the setting of an acute myocardial infarction of a moderate degree.  The patient has done better than expected and has rebounded very nicely.  Her electrolytes are doing well.  Her cardiac status is doing well with no fluid overload at the moment.  I think she can go home, progress her activity slowly, and she has cardiac followup already planned.  She will see me in a couple of weeks.  She will follow her no concentrated sweets diet as before.  She is to follow her sugars as before with taking enalapril 2.5 daily, alprazolam 0.5 daily, carvedilol 3.125 at bedtime, digoxin 0.125 daily, Crestor 20 daily, Lantus 20 twice daily with Humalog sliding scale.  She will continue on her iron tablets.  She will continue on her Lasix 20, her Aldactone 25, multivitamin daily, aspirin 325 with Plavix 75.  Her calcium with vitamin D and her potassium replacement.  No new medications were started during this hospitalization.  Overall, I think she should do fair.  Of course, she has high risk for decompensation just on multiple issues.          ______________________________ Tera Mater Evlyn Kanner, M.D.     SAS/MEDQ  D:  06/29/2010  T:  06/29/2010  Job:  [id number]  Electronically Signed by Adrian Prince M.D. on 07/10/2010 09:53:32 PM

## 2010-07-10 NOTE — Procedures (Signed)
  NAMEKELSI, [name]                     ACCOUNT NO.:  [id number]  MEDICAL RECORD NO.:  [id number]           PATIENT TYPE:  LOCATION:                                 FACILITY:  PHYSICIAN:  Aleyah Balik C. Eden Emms, MD, FACCDATE OF BIRTH:  08-Jul-1960  DATE OF PROCEDURE: DATE OF DISCHARGE:                           CARDIAC CATHETERIZATION   INDICATIONS:  A 50 year old brittle diabetic with known coronary artery disease.  Cath by Dr. Bascom Levels on December 11 showed a totally occluded LAD with faint collaterals.  She had 70%-80% diffusely diseased high takeoff first diagonal branch with that was thought best treated medically.  She was admitted to the hospital with nausea and vomiting and uncontrolled blood sugars.  CPKs were positive.  Study was done to reassess cardiac anatomy.  Standard catheterization was done from the right femoral artery using a 5-French sheath.  At the end of the case, we had good hemostasis with AngioSeal.  Standard JL-4 and JR-4 catheters were used.  Left main coronary artery was normal.  Left anterior descending artery was 100% occluded in the mid vessel. There were faint right-to-left collaterals.  There is a high takeoff first diagonal branch which had 70%-80% diffuse disease as seen previously.  The patient had a left dominant circumflex coronary artery. There was 30% tubular disease in the midvessel.  First and second obtuse marginal branches were small with 30%-40% multiple discrete lesions. There are 2 posterolateral branches in the PDA that was small without critical disease.  Right coronary artery was small and nondominant.  Due to the patient's creatinine and diabetes, we did not cross her valve.  Her EF is known to be in the 35% range with anterior apical hypokinesis.  LV pressure was 108/15, aortic pressure is 107/54.  IMPRESSION:  The patient has had no significant change in her anatomy since December.  Medical therapy is still warranted.  Her primary  issue will be control of her blood sugar as this is what causes her to come in with nausea and vomiting all the time and also make sure hypotensive and can lead to ischemia or subendocardial myocardial infarction, likely due to insufficient collaterals to what is left of her left anterior descending distribution.  She tolerated the diagnostic procedure well.     [name]. Eden Emms, MD, [facility]     PCN/MEDQ  D:  06/27/2010  T:  06/27/2010  Job:  [id number]  Electronically Signed by Charlton Haws MD [facility] on 07/10/2010 03:09:18 PM

## 2010-07-12 ENCOUNTER — Ambulatory Visit (HOSPITAL_COMMUNITY): Payer: PRIVATE HEALTH INSURANCE

## 2010-07-14 ENCOUNTER — Ambulatory Visit (HOSPITAL_COMMUNITY): Payer: PRIVATE HEALTH INSURANCE

## 2010-07-17 ENCOUNTER — Encounter: Payer: Self-pay | Admitting: Physician Assistant

## 2010-07-17 ENCOUNTER — Ambulatory Visit (HOSPITAL_COMMUNITY): Payer: PRIVATE HEALTH INSURANCE

## 2010-07-18 ENCOUNTER — Telehealth: Payer: Self-pay | Admitting: Cardiology

## 2010-07-18 NOTE — Telephone Encounter (Signed)
She should start back in cardiac rehab when she is able.  Would like to see her go 2-3 days without a hypoglycemic episode before she goes back.

## 2010-07-18 NOTE — Telephone Encounter (Signed)
Pt wants to know if she can go back to cardiac rehab.

## 2010-07-18 NOTE — Telephone Encounter (Signed)
Pt calls today about restarting OP cardiac rehab.  She has not been in the past couple of weeks b/c her blood sugars had been " out of control" and she also reported having a stomach virus.  She will call pcp regarding blood sugars as she has had EMS at her house at least twice with low blood sugar.  She would like to know from Dr. Shirlee Latch if she should start back with cardiac rehab if she is able? I told her I would forward this to him for review and we would call back when answer received. Mylo Red RN

## 2010-07-19 ENCOUNTER — Ambulatory Visit (HOSPITAL_COMMUNITY): Payer: PRIVATE HEALTH INSURANCE

## 2010-07-19 NOTE — Telephone Encounter (Signed)
I talked with pt by telephone. She is aware of Dr Alford Highland recommendations.
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
## 2010-07-21 ENCOUNTER — Ambulatory Visit (HOSPITAL_COMMUNITY): Payer: PRIVATE HEALTH INSURANCE

## 2010-07-24 ENCOUNTER — Ambulatory Visit (HOSPITAL_COMMUNITY): Payer: PRIVATE HEALTH INSURANCE

## 2010-07-26 ENCOUNTER — Ambulatory Visit (HOSPITAL_COMMUNITY): Payer: PRIVATE HEALTH INSURANCE

## 2010-07-28 ENCOUNTER — Ambulatory Visit (HOSPITAL_COMMUNITY): Payer: PRIVATE HEALTH INSURANCE

## 2010-07-31 ENCOUNTER — Ambulatory Visit (HOSPITAL_COMMUNITY): Payer: PRIVATE HEALTH INSURANCE

## 2010-08-01 LAB — GLUCOSE, CAPILLARY: Glucose-Capillary: 151 mg/dL — ABNORMAL HIGH (ref 70–99)

## 2010-08-02 ENCOUNTER — Ambulatory Visit (HOSPITAL_COMMUNITY): Payer: PRIVATE HEALTH INSURANCE

## 2010-08-04 ENCOUNTER — Ambulatory Visit (HOSPITAL_COMMUNITY): Payer: PRIVATE HEALTH INSURANCE

## 2010-08-07 ENCOUNTER — Ambulatory Visit (HOSPITAL_COMMUNITY): Payer: PRIVATE HEALTH INSURANCE

## 2010-08-09 ENCOUNTER — Ambulatory Visit (HOSPITAL_COMMUNITY): Payer: PRIVATE HEALTH INSURANCE

## 2010-08-11 ENCOUNTER — Ambulatory Visit (HOSPITAL_COMMUNITY): Payer: PRIVATE HEALTH INSURANCE

## 2010-08-12 ENCOUNTER — Encounter: Payer: Self-pay | Admitting: *Deleted

## 2010-08-12 ENCOUNTER — Encounter: Payer: Self-pay | Admitting: Cardiology

## 2010-08-14 ENCOUNTER — Ambulatory Visit (HOSPITAL_COMMUNITY): Payer: Self-pay

## 2010-08-15 ENCOUNTER — Encounter: Payer: Self-pay | Admitting: Cardiology

## 2010-08-15 ENCOUNTER — Ambulatory Visit (INDEPENDENT_AMBULATORY_CARE_PROVIDER_SITE_OTHER): Payer: Self-pay | Admitting: Cardiology

## 2010-08-15 DIAGNOSIS — I5022 Chronic systolic (congestive) heart failure: Secondary | ICD-10-CM

## 2010-08-15 DIAGNOSIS — I2510 Atherosclerotic heart disease of native coronary artery without angina pectoris: Secondary | ICD-10-CM

## 2010-08-15 DIAGNOSIS — R0602 Shortness of breath: Secondary | ICD-10-CM

## 2010-08-15 DIAGNOSIS — E785 Hyperlipidemia, unspecified: Secondary | ICD-10-CM

## 2010-08-15 MED ORDER — CARVEDILOL 6.25 MG PO TABS: 6.2500 mg | ORAL_TABLET | Freq: Two times a day (BID) | ORAL | Status: DC

## 2010-08-15 NOTE — Patient Instructions (Signed)
Increase Coreg(carvedilol) to 6.25mg  twice a day. You can take two 3.125mg  tablets twice a day.  Lab today--BMP/BNP Digoxin level 414.01  428.22  Schedule an appointment to see Dr Shirlee Latch in 2 months.

## 2010-08-15 NOTE — Progress Notes (Signed)
PCP: Dr. Evlyn Kanner  50 yo with history of Type I diabetes, CAD, and ischemic cardiomyopathy presents for cardiology followup.  She had a prolonged admission with DKA and systolic CHF in 12/11.  Cardiac enzymes were found to be elevated and left heart cath showed occluded LAD, which appeared chronic at the time of cath.  Echo showed peri-apical akinesis with EF 30%.  Management was complicated by orthostatic hypotension causing syncope.  This was likely due to combination of low cardiac output and diabetic autonomic neuropathy.  This limited titration of cardiac medications initially. Repeat echo (1/12) showed EF 40-45% (improved).  She was readmitted with DKA in 3/12, and cardiac enzymes were noted to be quite elevated with troponin peaking at 24 and CKMB at 69.  Repeat LHC was done, actually showing no significant difference from the 12/11 study.    Over the last few weeks, patient has been doing better.  Blood glucose is under better control, and she is working with a diabetes educator. She is not having orthostatic symptoms, and BP is good today.  She is able to walk on flat ground without dyspnea.  No orthopnea/PND.  No chest pain.  Weight is up 2 lbs since last appointment.   Labs (12/11): K 3.7, creatinine 0.88, BNP 612, LDL 29, HDL 44 Labs (1/12): digoxin 1.0, BNP 199, K 4.4, creatinine 0.9  Allergies (verified):  1)  Reglan 2)  Erythromycin  Past History:  Past Medical History: 1. Hypertension: However, since MI has had orthostatic hypotension.  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 CAD: NSTEMI 12/11.  Cardiac catheterization (12/11) showing LAD totalled in the midportion, diagonal 1 70% stenosed (small 2-mm vessel), circumflex nonobstructive, OM1 40%, OM2 30%, RCA small and no significant disease.  LAD occlusion was calcified and appeared chronic at the time of catheterization so no intervention.  Patient readmitted with DKA in 3/12, troponin found to be 24 and CKMB 69.  Repeat  LHC showed no change from 12/11 study.  5. Ischemic cardiomyopathy: Echo (12/11) with EF 30% and periapical akinesis, no significant MR, RV looked normal.  QRS is not wide on ECG.  Echo (1/12): EF 40-45%, severe hypokinesis of the anteroseptal wall, apical inferior wall, inferoseptal wall, and apex.  6. Orthostatic hypotension: Syncopal episode in 12/11 likely due to this.  Probably from combination of depressed cardiac output and diabetic autonomic neuropathy.  7. Type 1 insulin-dependent diabetes. History of DKA.  8. Hyperlipidemia.  9. Diabetic retinopathy.  10. Gastroparesis.  11. Diabetic nephropathy  12. Depression.   Family History: No FH of Colon Cancer: Family History of Pancreatic Cancer: Father deceased 109 No premature CAD  Social History: Divorced, 1 boy unemployed, lives in [place] Patient has never smoked.  Alcohol Use - no Daily Caffeine Use 1 cup/day Illicit Drug Use - no  Review of Systems        All systems reviewed and negative except as per HPI. \  Current Outpatient Prescriptions  Medication Sig Dispense Refill  . ALPRAZolam (XANAX) 0.5 MG tablet Take 0.5 mg by mouth at bedtime as needed.        [name] aspirin 325 MG tablet Take 325 mg by mouth daily.        . Calcium Carbonate-Vit D-Min 600-200 MG-UNIT TABS Take 1 tablet by mouth daily.        . citalopram (CELEXA) 20 MG tablet Take 20 mg by mouth daily.        . clopidogrel (PLAVIX) 75  MG tablet Take 75 mg by mouth daily.        . digoxin (LANOXIN) 0.125 MG tablet Take 125 mcg by mouth daily.        . enalapril (VASOTEC) 2.5 MG tablet Take 2.5 mg by mouth 2 (two) times daily.        . ferrous sulfate 325 (65 FE) MG tablet Take 325 mg by mouth daily with breakfast.        . furosemide (LASIX) 20 MG tablet Take 20 mg by mouth daily.       . insulin glargine (LANTUS) 100 UNIT/ML injection Inject 10 Units into the skin every evening. 14 units in the morning       . insulin lispro (HUMALOG KWIKPEN) 100  UNIT/ML injection Inject into the skin. Sliding scale        . Multiple Vitamins-Iron (QC DAILY MULTIVITAMINS/IRON) TABS Take 1 tablet by mouth daily.        . potassium chloride (KLOR-CON) 10 MEQ CR tablet Take 10 mEq by mouth daily.        . rosuvastatin (CRESTOR) 20 MG tablet Take 20 mg by mouth daily.        [name] spironolactone (ALDACTONE) 25 MG tablet Take 25 mg by mouth daily.        [name] terbinafine (LAMISIL AT) 1 % cream Apply topically 2 (two) times daily.        . carvedilol (COREG) 6.25 MG tablet Take 1 tablet (6.25 mg total) by mouth 2 (two) times daily.  60 tablet  6    BP 124/64  Pulse 76  Ht 5\' 4"  (1.626 m)  Wt 146 lb 14.4 oz (66.633 kg)  BMI 25.22 kg/m2 General:  Well developed, well nourished, in no acute distress. Head:  Annular skin lesion on forehead.  Neck:  Neck supple, no JVD. No masses, thyromegaly or abnormal cervical nodes. Lungs:  Clear bilaterally to auscultation and percussion. Heart:  Non-displaced PMI, chest non-tender; regular rate and rhythm, S1, S2 without murmurs, rubs or gallops. Carotid upstroke normal, no bruit. Pedals normal pulses. No edema.  Abdomen:  Bowel sounds positive; abdomen soft and non-tender without masses, organomegaly, or hernias noted. No hepatosplenomegaly. Extremities:  No clubbing or cyanosis. Neurologic:  Alert and oriented x 3. Psych:  Normal affect.

## 2010-08-16 ENCOUNTER — Ambulatory Visit (HOSPITAL_COMMUNITY): Payer: Self-pay

## 2010-08-16 ENCOUNTER — Telehealth: Payer: Self-pay | Admitting: *Deleted

## 2010-08-16 ENCOUNTER — Telehealth: Payer: Self-pay | Admitting: Cardiology

## 2010-08-16 LAB — DIGOXIN LEVEL: Digoxin Level: 1.6 ng/mL (ref 0.8–2.0)

## 2010-08-16 NOTE — Telephone Encounter (Signed)
I spoke with the pt and made her aware that our office will mail her an order to have Digoxin level repeated at [facility] around 08/28/10.  Order written and placed on Anne Lankford's desk for MD to sign and to be mailed.

## 2010-08-16 NOTE — Telephone Encounter (Signed)
I spoke with pt and she will start taking digoxin 0.125mg  every other day.  She has taken her medication today. She requested having repeat Digoxin level drawn at [facility] medicine.  She will call them back to schedule this for next Thursday (08/24/10).  She will call back if she has further questions. Mylo Red RN

## 2010-08-16 NOTE — Assessment & Plan Note (Signed)
Check lipids/LFTs.  Goal LDL < 70.  

## 2010-08-16 NOTE — Telephone Encounter (Signed)
Message copied by Lisabeth Devoid on Wed Aug 16, 2010  2:10 PM ------      Message from: [place], [place]      Created: Wed Aug 16, 2010 12:42 PM       Digoxin level too high, decrease to 0.125 mg every other day and repeat digoxin level in 10 days.

## 2010-08-16 NOTE — Telephone Encounter (Signed)
Pt had labs drawn yesterday and her levels were messed up and was told to have them drawn again in 10 days and wants to know if she can send an order to Dr [facility] office in [place] so she doesn't have to drive to [place] to have these done.

## 2010-08-16 NOTE — Assessment & Plan Note (Signed)
Stable coronary disease with occluded mid-LAD.  She had quite elevated cardiac enzymes when she was hospitalized with DKA in 3/12, but her coronary disease was unchanged.  I am unsure what caused the enzyme elevation.  Perhaps she had a hypoperfusion event early in the course of DKA with demand cardiac ischemia.   - Continue ASA (can decrease to 81 mg daily), continue Plavix.   - Resume cardiac rehab - Continue Coreg, enalapril, statin.

## 2010-08-16 NOTE — Assessment & Plan Note (Signed)
EF 40-45% on most recent echo.  Patient is euvolemic on exam with NYHA class II symptoms currently.  BP is improved, and she is not having orthostatic symptoms.   - Continue enalapril, digoxin, spironolactone. - Check BMET, BNP, digoxin level.  - Increase Coreg to 6.25 mg bid.

## 2010-08-17 ENCOUNTER — Telehealth: Payer: Self-pay | Admitting: *Deleted

## 2010-08-17 NOTE — Telephone Encounter (Signed)
Message copied by Katina Dung on Thu Aug 17, 2010  2:57 PM ------      Message from: [facility], Freida Busman      Created: Tue Aug 15, 2010 10:51 PM       This lipid report suggests that she is not taking Crestor.  If she is really taking Crestor 20, need to increase to 40 and repeat lipids 2 months.  If she is not taking Crestor 20, needs to start and get lipids in 2 months.

## 2010-08-17 NOTE — Telephone Encounter (Signed)
Notes Recorded by Jacqlyn Krauss, RN on 08/17/2010 at 2:55 PM I talked with pt by telephone. Dr Evlyn Kanner had already recommended increasing Crestor to two 20mg  tablets daily. I mailed pt an order for fasting lipid profile a few days before the 10/17/10 appt with Dr Shirlee Latch. Notes Recorded by Marca Ancona, MD on 08/15/2010 at 10:51 PM This lipid report suggests that she is not taking Crestor. If she is really taking Crestor 20, need to increase to 40 and repeat lipids 2 months. If she is not taking Crestor 20, needs to start and get lipids in 2 months

## 2010-08-17 NOTE — Telephone Encounter (Signed)
I mailed pt an order for repeat Digoxin level around 08/28/10.

## 2010-08-17 NOTE — Telephone Encounter (Signed)
OK to get repeat done at [facility]

## 2010-08-17 NOTE — Telephone Encounter (Signed)
Order mailed to pt  for repeat Digoxin level around  08/28/10

## 2010-08-18 ENCOUNTER — Ambulatory Visit (HOSPITAL_COMMUNITY): Payer: Self-pay

## 2010-08-21 ENCOUNTER — Ambulatory Visit (HOSPITAL_COMMUNITY): Payer: Self-pay

## 2010-08-21 ENCOUNTER — Encounter (HOSPITAL_COMMUNITY)
Admission: RE | Admit: 2010-08-21 | Discharge: 2010-08-21 | Disposition: A | Discharge status: Home / Self Care | Payer: PRIVATE HEALTH INSURANCE | Source: Ambulatory Visit | Attending: Cardiology | Admitting: Cardiology

## 2010-08-21 DIAGNOSIS — I2510 Atherosclerotic heart disease of native coronary artery without angina pectoris: Secondary | ICD-10-CM | POA: Insufficient documentation

## 2010-08-21 DIAGNOSIS — I252 Old myocardial infarction: Secondary | ICD-10-CM | POA: Insufficient documentation

## 2010-08-23 ENCOUNTER — Ambulatory Visit (HOSPITAL_COMMUNITY): Payer: Self-pay

## 2010-08-23 ENCOUNTER — Encounter (HOSPITAL_COMMUNITY)
Admission: RE | Admit: 2010-08-23 | Discharge: 2010-08-23 | Disposition: A | Discharge status: Home / Self Care | Payer: Self-pay | Source: Ambulatory Visit | Attending: Cardiology | Admitting: Cardiology

## 2010-08-23 DIAGNOSIS — I2510 Atherosclerotic heart disease of native coronary artery without angina pectoris: Secondary | ICD-10-CM | POA: Insufficient documentation

## 2010-08-23 DIAGNOSIS — I252 Old myocardial infarction: Secondary | ICD-10-CM | POA: Insufficient documentation

## 2010-08-25 ENCOUNTER — Ambulatory Visit (HOSPITAL_COMMUNITY): Payer: PRIVATE HEALTH INSURANCE

## 2010-08-25 ENCOUNTER — Encounter (HOSPITAL_COMMUNITY): Payer: PRIVATE HEALTH INSURANCE

## 2010-08-28 ENCOUNTER — Ambulatory Visit (HOSPITAL_COMMUNITY)
Admission: RE | Admit: 2010-08-28 | Discharge: 2010-08-28 | Disposition: A | Discharge status: Home / Self Care | Payer: PRIVATE HEALTH INSURANCE | Source: Ambulatory Visit | Attending: Endocrinology | Admitting: Endocrinology

## 2010-08-28 ENCOUNTER — Ambulatory Visit (HOSPITAL_COMMUNITY): Payer: PRIVATE HEALTH INSURANCE

## 2010-08-28 ENCOUNTER — Other Ambulatory Visit (HOSPITAL_COMMUNITY): Payer: Self-pay | Admitting: Endocrinology

## 2010-08-28 ENCOUNTER — Encounter (HOSPITAL_COMMUNITY): Payer: Self-pay

## 2010-08-28 ENCOUNTER — Encounter: Payer: Self-pay | Admitting: Cardiology

## 2010-08-28 DIAGNOSIS — I6350 Cerebral infarction due to unspecified occlusion or stenosis of unspecified cerebral artery: Secondary | ICD-10-CM | POA: Insufficient documentation

## 2010-08-28 DIAGNOSIS — R531 Weakness: Secondary | ICD-10-CM

## 2010-08-28 DIAGNOSIS — R29898 Other symptoms and signs involving the musculoskeletal system: Secondary | ICD-10-CM | POA: Insufficient documentation

## 2010-08-29 NOTE — Telephone Encounter (Signed)
Per pt calling, personal question no information was given.

## 2010-08-29 NOTE — Telephone Encounter (Signed)
I talked with pt. Pt states Dr Evlyn Kanner ordered scan today. 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 Pt asking if she should continue Cardiac Rehab. I reviewed with Dr Shirlee Latch. He states OK for pt to continue Cardiac Rehab if old CVA and no new symptoms. Pt states no new symptoms.

## 2010-08-30 ENCOUNTER — Encounter (HOSPITAL_COMMUNITY): Payer: PRIVATE HEALTH INSURANCE

## 2010-08-30 ENCOUNTER — Ambulatory Visit (HOSPITAL_COMMUNITY): Payer: PRIVATE HEALTH INSURANCE

## 2010-09-01 ENCOUNTER — Ambulatory Visit (HOSPITAL_COMMUNITY): Payer: PRIVATE HEALTH INSURANCE

## 2010-09-01 ENCOUNTER — Encounter (HOSPITAL_COMMUNITY): Payer: PRIVATE HEALTH INSURANCE

## 2010-09-04 ENCOUNTER — Ambulatory Visit (HOSPITAL_COMMUNITY): Payer: PRIVATE HEALTH INSURANCE

## 2010-09-04 ENCOUNTER — Encounter (HOSPITAL_COMMUNITY): Payer: PRIVATE HEALTH INSURANCE

## 2010-09-06 ENCOUNTER — Encounter (HOSPITAL_COMMUNITY): Payer: Self-pay

## 2010-09-06 ENCOUNTER — Ambulatory Visit (HOSPITAL_COMMUNITY): Payer: PRIVATE HEALTH INSURANCE

## 2010-09-06 ENCOUNTER — Telehealth: Payer: Self-pay | Admitting: Cardiology

## 2010-09-06 NOTE — Telephone Encounter (Signed)
Pt is taking plavix and she just wants to let us know her refills will come in on Tuesday here at office and you don't have to call her back she just wanted to let us know it will be coming to the office

## 2010-09-06 NOTE — Telephone Encounter (Signed)
Pt's supply of Plavix from [facility] is supposed to be mailed to our office by next Tuesday. Pt requesting a call when the medication arrives at our office.

## 2010-09-08 ENCOUNTER — Ambulatory Visit (HOSPITAL_COMMUNITY): Payer: PRIVATE HEALTH INSURANCE

## 2010-09-08 ENCOUNTER — Encounter (HOSPITAL_COMMUNITY): Payer: PRIVATE HEALTH INSURANCE

## 2010-09-11 ENCOUNTER — Ambulatory Visit (HOSPITAL_COMMUNITY): Payer: PRIVATE HEALTH INSURANCE

## 2010-09-11 ENCOUNTER — Encounter (HOSPITAL_COMMUNITY): Payer: PRIVATE HEALTH INSURANCE

## 2010-09-13 ENCOUNTER — Telehealth: Payer: Self-pay | Admitting: *Deleted

## 2010-09-13 ENCOUNTER — Encounter (HOSPITAL_COMMUNITY): Payer: PRIVATE HEALTH INSURANCE

## 2010-09-13 ENCOUNTER — Ambulatory Visit (HOSPITAL_COMMUNITY): Payer: PRIVATE HEALTH INSURANCE

## 2010-09-13 NOTE — Telephone Encounter (Signed)
Pt notified that Plavix 75mg  daily  #90 from [facility] was at front desk for her to pick-up.

## 2010-09-15 ENCOUNTER — Encounter (HOSPITAL_COMMUNITY): Payer: PRIVATE HEALTH INSURANCE

## 2010-09-15 ENCOUNTER — Ambulatory Visit (HOSPITAL_COMMUNITY): Payer: PRIVATE HEALTH INSURANCE

## 2010-09-18 ENCOUNTER — Encounter (HOSPITAL_COMMUNITY): Payer: Self-pay

## 2010-09-18 ENCOUNTER — Ambulatory Visit (HOSPITAL_COMMUNITY): Payer: PRIVATE HEALTH INSURANCE

## 2010-09-20 ENCOUNTER — Ambulatory Visit (HOSPITAL_COMMUNITY): Payer: PRIVATE HEALTH INSURANCE

## 2010-09-20 ENCOUNTER — Encounter (HOSPITAL_COMMUNITY): Payer: Self-pay

## 2010-09-22 ENCOUNTER — Ambulatory Visit (HOSPITAL_COMMUNITY): Payer: PRIVATE HEALTH INSURANCE

## 2010-09-22 ENCOUNTER — Encounter (HOSPITAL_COMMUNITY)
Admission: RE | Admit: 2010-09-22 | Discharge: 2010-09-22 | Disposition: A | Discharge status: Home / Self Care | Payer: Medicaid Other | Source: Ambulatory Visit | Attending: Cardiology | Admitting: Cardiology

## 2010-09-22 DIAGNOSIS — I2510 Atherosclerotic heart disease of native coronary artery without angina pectoris: Secondary | ICD-10-CM | POA: Insufficient documentation

## 2010-09-22 DIAGNOSIS — I252 Old myocardial infarction: Secondary | ICD-10-CM | POA: Insufficient documentation

## 2010-09-25 ENCOUNTER — Encounter (HOSPITAL_COMMUNITY): Payer: Medicaid Other

## 2010-09-25 ENCOUNTER — Ambulatory Visit (HOSPITAL_COMMUNITY): Payer: PRIVATE HEALTH INSURANCE

## 2010-09-27 ENCOUNTER — Ambulatory Visit (HOSPITAL_COMMUNITY): Payer: PRIVATE HEALTH INSURANCE

## 2010-09-27 ENCOUNTER — Encounter (HOSPITAL_COMMUNITY): Payer: Medicaid Other

## 2010-09-29 ENCOUNTER — Encounter (HOSPITAL_COMMUNITY): Payer: Medicaid Other

## 2010-09-29 ENCOUNTER — Emergency Department (HOSPITAL_COMMUNITY)
Admission: EM | Admit: 2010-09-29 | Discharge: 2010-09-29 | Disposition: A | Discharge status: Home / Self Care | Payer: PRIVATE HEALTH INSURANCE | Attending: Emergency Medicine | Admitting: Emergency Medicine

## 2010-09-29 DIAGNOSIS — E1169 Type 2 diabetes mellitus with other specified complication: Secondary | ICD-10-CM | POA: Insufficient documentation

## 2010-09-29 DIAGNOSIS — Z8673 Personal history of transient ischemic attack (TIA), and cerebral infarction without residual deficits: Secondary | ICD-10-CM | POA: Insufficient documentation

## 2010-09-29 DIAGNOSIS — I252 Old myocardial infarction: Secondary | ICD-10-CM | POA: Insufficient documentation

## 2010-09-29 LAB — URINALYSIS, ROUTINE W REFLEX MICROSCOPIC
Hgb urine dipstick: NEGATIVE
Leukocytes, UA: NEGATIVE
Nitrite: NEGATIVE
Protein, ur: NEGATIVE mg/dL
Specific Gravity, Urine: 1.02 (ref 1.005–1.030)
Urobilinogen, UA: 0.2 mg/dL (ref 0.0–1.0)

## 2010-09-29 LAB — CBC
Hemoglobin: 12.3 g/dL (ref 12.0–15.0)
MCHC: 32.5 g/dL (ref 30.0–36.0)
Platelets: 379 10*3/uL (ref 150–400)

## 2010-09-29 LAB — BASIC METABOLIC PANEL
GFR calc Af Amer: >60 mL/min (ref >60)
GFR calc non Af Amer: 59 mL/min — ABNORMAL LOW (ref >60)
Glucose, Bld: 111 mg/dL — ABNORMAL HIGH (ref 70–99)
Potassium: 4.2 mEq/L (ref 3.5–5.1)
Sodium: 137 mEq/L (ref 135–145)

## 2010-09-29 LAB — GLUCOSE, CAPILLARY
Glucose-Capillary: 22 mg/dL — CL (ref 70–99)
Glucose-Capillary: 213 mg/dL — ABNORMAL HIGH (ref 70–99)

## 2010-09-29 LAB — DIFFERENTIAL
Basophils Absolute: 0 10*3/uL (ref 0.0–0.1)
Basophils Relative: 0 % (ref 0–1)
Eosinophils Absolute: 0.4 10*3/uL (ref 0.0–0.7)
Monocytes Absolute: 1 10*3/uL (ref 0.1–1.0)
Neutro Abs: 4.7 10*3/uL (ref 1.7–7.7)
Neutrophils Relative %: 54 % (ref 43–77)

## 2010-10-02 ENCOUNTER — Telehealth: Payer: Self-pay | Admitting: Cardiology

## 2010-10-02 ENCOUNTER — Encounter (HOSPITAL_COMMUNITY): Payer: Medicaid Other

## 2010-10-02 LAB — GLUCOSE, CAPILLARY: Glucose-Capillary: 24 mg/dL — CL (ref 70–99)

## 2010-10-02 NOTE — Telephone Encounter (Signed)
Will forward message to Thurston Hole RN to discuss with Dr Shirlee Latch.

## 2010-10-02 NOTE — Telephone Encounter (Signed)
Pt going to a medicaid hearing next week , pt had a heart attack and been in and at of the hospital, needs letter on her behalf from dr Shirlee Latch stating that she needs medicaid to help pay her bills  pls call [phone] if he can does this

## 2010-10-03 ENCOUNTER — Encounter: Payer: Self-pay | Admitting: *Deleted

## 2010-10-04 ENCOUNTER — Encounter (HOSPITAL_COMMUNITY): Payer: Medicaid Other

## 2010-10-04 NOTE — Telephone Encounter (Signed)
Letter done 10/03/10. I talked with pt and she requested letter be mailed to her. I mailed letter dated 10/03/10 to pt.  Copy of letter signed by Dr Shirlee Latch forwarded to HIM.

## 2010-10-06 ENCOUNTER — Encounter (HOSPITAL_COMMUNITY): Payer: Medicaid Other

## 2010-10-09 ENCOUNTER — Encounter (HOSPITAL_COMMUNITY): Payer: Medicaid Other

## 2010-10-10 ENCOUNTER — Encounter: Payer: Self-pay | Admitting: Cardiology

## 2010-10-11 ENCOUNTER — Encounter (HOSPITAL_COMMUNITY): Payer: Medicaid Other

## 2010-10-13 ENCOUNTER — Encounter (HOSPITAL_COMMUNITY): Payer: Medicaid Other

## 2010-10-16 ENCOUNTER — Encounter (HOSPITAL_COMMUNITY): Payer: Medicaid Other

## 2010-10-17 ENCOUNTER — Ambulatory Visit (INDEPENDENT_AMBULATORY_CARE_PROVIDER_SITE_OTHER): Payer: Self-pay | Admitting: Cardiology

## 2010-10-17 ENCOUNTER — Encounter: Payer: Self-pay | Admitting: Cardiology

## 2010-10-17 DIAGNOSIS — I6350 Cerebral infarction due to unspecified occlusion or stenosis of unspecified cerebral artery: Secondary | ICD-10-CM

## 2010-10-17 DIAGNOSIS — I2510 Atherosclerotic heart disease of native coronary artery without angina pectoris: Secondary | ICD-10-CM

## 2010-10-17 DIAGNOSIS — I5022 Chronic systolic (congestive) heart failure: Secondary | ICD-10-CM

## 2010-10-17 DIAGNOSIS — I639 Cerebral infarction, unspecified: Secondary | ICD-10-CM | POA: Insufficient documentation

## 2010-10-17 MED ORDER — ENALAPRIL MALEATE 5 MG PO TABS: 5.0000 mg | ORAL_TABLET | Freq: Every day | ORAL | Status: DC

## 2010-10-17 NOTE — Assessment & Plan Note (Signed)
Stable and well-compensated.  NYHA class II symptoms with no evidence for volume overload.   - Continue current digoxin, Coreg, and spironolactone.  - Increase enalapril to 5 mg bid with BMET/BNP in 1 week.

## 2010-10-17 NOTE — Progress Notes (Signed)
PCP: Dr. Evlyn Kanner  50 yo with history of Type I diabetes, CAD, and ischemic cardiomyopathy presents for cardiology followup.  [name] had a prolonged admission with DKA and systolic CHF in 12/11.  Cardiac enzymes were found to be elevated and left heart cath showed occluded LAD, which appeared chronic at the time of cath.  Echo showed peri-apical akinesis with EF 30%.  Management was complicated by orthostatic hypotension causing syncope.  This was likely due to combination of low cardiac output and diabetic autonomic neuropathy.  This limited titration of cardiac medications initially. Repeat echo (1/12) showed EF 40-45% (improved).  [name] was readmitted with DKA in 3/12, and cardiac enzymes were noted to be quite elevated with troponin peaking at 24 and CKMB at 69.  Repeat LHC was done, actually showing no significant difference from the 12/11 study.    At last appointment, patient was doing fairly well.  However, in 5/12, [name] noted weakness one day in her right leg and arm.  [name] had a head MRI done.  I reviewed the results today: [name] had a small acute left parietal infarction.  This stroke occurred while on ASA and Plavix.  The weakness has improved over time but is still present.  From a cardiopulmonary standpoint, [name] seems to be doing well.  No significant exertional dyspnea.  [name] is able to climb a flight of steps.  No chest pain.  [name] has been doing cardiac rehab and will finish up soon.   Labs (12/11): K 3.7, creatinine 0.88, BNP 612, LDL 29, HDL 44 Labs (1/12): digoxin 1.0, BNP 199, K 4.4, creatinine 0.9 Labs (5/12): digoxin 0.4 Labs (6/12): K 4.2, creatinine 0.99  Allergies (verified):  1)  Reglan 2)  Erythromycin  Past History:  Past Medical History: 1. Hypertension: However, since MI has had orthostatic hypotension.  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 CAD: NSTEMI 12/11.  Cardiac catheterization (12/11) showing LAD totalled in the midportion, diagonal 1 70% stenosed (small  2-mm vessel), circumflex nonobstructive, OM1 40%, OM2 30%, RCA small and no significant disease.  LAD occlusion was calcified and appeared chronic at the time of catheterization so no intervention.  Patient readmitted with DKA in 3/12, troponin found to be 24 and CKMB 69.  Repeat LHC showed no change from 12/11 study.  5. Ischemic cardiomyopathy: Echo (12/11) with EF 30% and periapical akinesis, no significant MR, RV looked normal.  QRS is not wide on ECG.  Echo (1/12): EF 40-45%, severe hypokinesis of the anteroseptal wall, apical inferior wall, inferoseptal wall, and apex.  6. Orthostatic hypotension: Syncopal episode in 12/11 likely due to this.  Probably from combination of depressed cardiac output and diabetic autonomic neuropathy.  7. Type 1 insulin-dependent diabetes. History of DKA.  8. Hyperlipidemia.  9. Diabetic retinopathy.  10. Gastroparesis.  11. Diabetic nephropathy  12. Depression.  13. Left parietal infarction: small.   Family History: No FH of Colon Cancer: Family History of Pancreatic Cancer: Father deceased 17 No premature CAD  Social History: Divorced, 1 boy unemployed, lives in [place] Patient has never smoked.  Alcohol Use - no Daily Caffeine Use 1 cup/day Illicit Drug Use - no  Review of Systems        All systems reviewed and negative except as per HPI.   Current Outpatient Prescriptions  Medication Sig Dispense Refill  . ALPRAZolam (XANAX) 0.5 MG tablet Take 0.5 mg by mouth at bedtime as needed.        . Calcium  Carbonate-Vit D-Min 600-200 MG-UNIT TABS Take 1 tablet by mouth daily.        . carvedilol (COREG) 6.25 MG tablet Take 1 tablet (6.25 mg total) by mouth 2 (two) times daily.  60 tablet  6  . citalopram (CELEXA) 20 MG tablet Take 20 mg by mouth daily.        . clopidogrel (PLAVIX) 75 MG tablet Take 75 mg by mouth daily.        . digoxin (LANOXIN) 0.125 MG tablet Take 125 mcg by mouth every other day.       . ferrous sulfate 325 (65 FE) MG  tablet Take 325 mg by mouth daily with breakfast.        . furosemide (LASIX) 20 MG tablet Take 20 mg by mouth daily.       . insulin glargine (LANTUS) 100 UNIT/ML injection Inject 10 Units into the skin every evening. 14 units in the morning       . insulin lispro (HUMALOG KWIKPEN) 100 UNIT/ML injection Inject into the skin. Sliding scale        . Multiple Vitamins-Iron (QC DAILY MULTIVITAMINS/IRON) TABS Take 1 tablet by mouth daily.        . potassium chloride (KLOR-CON) 10 MEQ CR tablet Take 10 mEq by mouth daily.        . rosuvastatin (CRESTOR) 40 MG tablet Take 1 tablet (40 mg total) by mouth daily.  30 tablet  11  . spironolactone (ALDACTONE) 25 MG tablet Take 25 mg by mouth daily.        [name] terbinafine (LAMISIL AT) 1 % cream Apply topically 2 (two) times daily.        [name] DISCONTD: aspirin 325 MG tablet Take 325 mg by mouth daily.        [name] DISCONTD: enalapril (VASOTEC) 2.5 MG tablet Take 2.5 mg by mouth 2 (two) times daily.       [name] aspirin EC 81 MG tablet Take 1 tablet (81 mg total) by mouth daily.      . enalapril (VASOTEC) 5 MG tablet Take 1 tablet (5 mg total) by mouth daily.  60 tablet  6    BP 116/64  Pulse 67  Ht 5\' 4"  (1.626 m)  Wt 154 lb (69.854 kg)  BMI 26.43 kg/m2 General:  Well developed, well nourished, in no acute distress. Head:  Annular skin lesion on forehead.  Neck:  Neck supple, no JVD. No masses, thyromegaly or abnormal cervical nodes. Lungs:  Clear bilaterally to auscultation and percussion. Heart:  Non-displaced PMI, chest non-tender; regular rate and rhythm, S1, S2 without murmurs, rubs or gallops. Carotid upstroke normal, no bruit. Pedals normal pulses. No edema.  Abdomen:  Bowel sounds positive; abdomen soft and non-tender without masses, organomegaly, or hernias noted. No hepatosplenomegaly. Extremities:  No clubbing or cyanosis. Neurologic:  Alert and oriented x 3. Right arm is weak.  Psych:  Normal affect.

## 2010-10-17 NOTE — Assessment & Plan Note (Signed)
Small acute left parietal infarct in 5/12 with residual right-sided weakness.  She was actually already on both Plavix and ASA when stroke occurred.   I am concerned that cardioembolism could have been the cause.  I will get a repeat echo with Definity contrast to better evaluate the LV apex for thrombus.  I am going to have her wear a 3-week event monitor to look for atrial fibrillation.  I will get carotid dopplers.   Question to determine from the workup will be whether she should be on coumadin.

## 2010-10-17 NOTE — Assessment & Plan Note (Signed)
Stable coronary disease with occluded mid-LAD.  She had quite elevated cardiac enzymes when she was hospitalized with DKA in 3/12, but her coronary disease was unchanged.  I am unsure what caused the enzyme elevation.  Perhaps she had a hypoperfusion event early in the course of DKA with demand cardiac ischemia.   - Continue ASA (can decrease to 81 mg daily), continue Plavix.   - Continue Coreg, enalapril, statin.

## 2010-10-17 NOTE — Patient Instructions (Addendum)
Increase Enalapril to 5mg  twice a day.  Decrease Aspirin to 81mg  daily--this should be enteric coated.  Dr Shirlee Latch has referred you to neurology.  Schedule an appointment for a carotid doppler.  Schedule an appointment for an echocardiogram.   Schedule an appointment for a 3 week event monitor.   Schedule an appt for lab in 1 week--BMP 414.01  428.22  Schedule an appointment to see Dr Shirlee Latch in 4-5 weeks. Have the event monitor completed before you see Dr Shirlee Latch in 4-5 weeks.

## 2010-10-18 ENCOUNTER — Encounter (HOSPITAL_COMMUNITY): Payer: Medicaid Other

## 2010-10-18 ENCOUNTER — Telehealth: Payer: Self-pay | Admitting: *Deleted

## 2010-10-18 ENCOUNTER — Telehealth: Payer: Self-pay

## 2010-10-18 NOTE — Telephone Encounter (Signed)
[name] - MONITOR More Detail >>      MONITOR      [name]        Sent: Wed October 18, 2010  4:09 PM    To: Jacqlyn Krauss, RN        [name]    MRN: [id number] DOB: Dec 29, 1960     Pt Home: [phone]               Message     10/18/10 Patient refused due to no insurance. D.Miller --pt refused monitor due to no insurance

## 2010-10-18 NOTE — Telephone Encounter (Signed)
Patient refused monitor at this time due to no Insurance  would like to wait  until she goes for her medicaid in  July ,I check with Dr.Mclean and he said this will be ok to wait.

## 2010-10-18 NOTE — Telephone Encounter (Signed)
Message copied by Jacqlyn Krauss on Wed Oct 18, 2010  5:05 PM ------      Message from: Mariane Masters D      Created: Wed Oct 18, 2010  4:09 PM      Regarding: MONITOR       10/18/10 Patient refused due to no insurance. D.Miller

## 2010-10-20 ENCOUNTER — Encounter (HOSPITAL_COMMUNITY): Payer: Medicaid Other

## 2010-10-20 ENCOUNTER — Telehealth: Payer: Self-pay | Admitting: *Deleted

## 2010-10-20 NOTE — Telephone Encounter (Signed)
Notified pt that Plavix 75mg  #90 at desk ready for pickup. order #[id number] lot [id number] exp Feb 2015

## 2010-10-23 ENCOUNTER — Encounter (HOSPITAL_COMMUNITY): Payer: Medicaid Other

## 2010-10-23 DIAGNOSIS — I252 Old myocardial infarction: Secondary | ICD-10-CM | POA: Insufficient documentation

## 2010-10-23 DIAGNOSIS — I2510 Atherosclerotic heart disease of native coronary artery without angina pectoris: Secondary | ICD-10-CM | POA: Insufficient documentation

## 2010-10-25 ENCOUNTER — Encounter (HOSPITAL_COMMUNITY): Payer: Medicaid Other

## 2010-10-26 ENCOUNTER — Telehealth: Payer: Self-pay | Admitting: Cardiology

## 2010-10-26 ENCOUNTER — Telehealth: Payer: Self-pay | Admitting: *Deleted

## 2010-10-26 NOTE — Telephone Encounter (Signed)
Per Dr. Shirlee Latch I talked with pt and she will stop Spironolactone and Potassium.  She will hold her furosemide for 5 days then restart on 11/01/10.  She had already taken all of her medications today.  She did go to [facility] this morning for repeat bmet. Mylo Red RN

## 2010-10-26 NOTE — Telephone Encounter (Signed)
Pt needs to know what pills she is to stop for 5days she has short term memory loss and she needs to make sure she has it right

## 2010-10-26 NOTE — Telephone Encounter (Signed)
LMTCB Debbie Vartan Kerins RN  

## 2010-10-27 ENCOUNTER — Other Ambulatory Visit (HOSPITAL_COMMUNITY): Payer: Self-pay | Admitting: Radiology

## 2010-10-27 ENCOUNTER — Encounter: Payer: Self-pay | Admitting: Cardiology

## 2010-10-27 ENCOUNTER — Encounter (HOSPITAL_COMMUNITY)
Admission: RE | Admit: 2010-10-27 | Discharge: 2010-10-27 | Disposition: A | Discharge status: Home / Self Care | Payer: Medicaid Other | Source: Ambulatory Visit | Attending: Cardiology | Admitting: Cardiology

## 2010-10-30 ENCOUNTER — Encounter (HOSPITAL_COMMUNITY)
Admission: RE | Admit: 2010-10-30 | Discharge: 2010-10-30 | Disposition: A | Discharge status: Home / Self Care | Payer: Medicaid Other | Source: Ambulatory Visit | Attending: Cardiology | Admitting: Cardiology

## 2010-11-01 ENCOUNTER — Encounter (HOSPITAL_COMMUNITY)
Admission: RE | Admit: 2010-11-01 | Discharge: 2010-11-01 | Disposition: A | Discharge status: Home / Self Care | Payer: Medicaid Other | Source: Ambulatory Visit | Attending: Cardiology | Admitting: Cardiology

## 2010-11-03 ENCOUNTER — Encounter (HOSPITAL_COMMUNITY)
Admission: RE | Admit: 2010-11-03 | Discharge: 2010-11-03 | Disposition: A | Discharge status: Home / Self Care | Payer: Medicaid Other | Source: Ambulatory Visit | Attending: Cardiology | Admitting: Cardiology

## 2010-11-06 ENCOUNTER — Other Ambulatory Visit (HOSPITAL_COMMUNITY): Payer: Self-pay

## 2010-11-06 ENCOUNTER — Other Ambulatory Visit (HOSPITAL_COMMUNITY): Payer: Self-pay | Admitting: Radiology

## 2010-11-06 ENCOUNTER — Encounter (INDEPENDENT_AMBULATORY_CARE_PROVIDER_SITE_OTHER): Payer: Self-pay | Admitting: Cardiology

## 2010-11-06 ENCOUNTER — Encounter: Payer: Self-pay | Admitting: Cardiology

## 2010-11-06 ENCOUNTER — Ambulatory Visit (HOSPITAL_COMMUNITY): Payer: Medicaid Other | Attending: Cardiology | Admitting: Radiology

## 2010-11-06 DIAGNOSIS — I2510 Atherosclerotic heart disease of native coronary artery without angina pectoris: Secondary | ICD-10-CM

## 2010-11-06 DIAGNOSIS — E785 Hyperlipidemia, unspecified: Secondary | ICD-10-CM | POA: Insufficient documentation

## 2010-11-06 DIAGNOSIS — I059 Rheumatic mitral valve disease, unspecified: Secondary | ICD-10-CM | POA: Insufficient documentation

## 2010-11-06 DIAGNOSIS — I639 Cerebral infarction, unspecified: Secondary | ICD-10-CM

## 2010-11-06 DIAGNOSIS — I428 Other cardiomyopathies: Secondary | ICD-10-CM

## 2010-11-06 DIAGNOSIS — I6789 Other cerebrovascular disease: Secondary | ICD-10-CM

## 2010-11-06 DIAGNOSIS — I10 Essential (primary) hypertension: Secondary | ICD-10-CM | POA: Insufficient documentation

## 2010-11-06 DIAGNOSIS — E119 Type 2 diabetes mellitus without complications: Secondary | ICD-10-CM | POA: Insufficient documentation

## 2010-11-06 DIAGNOSIS — I5022 Chronic systolic (congestive) heart failure: Secondary | ICD-10-CM

## 2010-11-06 MED ORDER — PERFLUTREN PROTEIN A MICROSPH IV SUSP
3.0000 mL | Freq: Once | INTRAVENOUS | Status: AC
  Administered 2010-11-06: 3 mL via INTRAVENOUS

## 2010-11-06 MED ORDER — PERFLUTREN PROTEIN A MICROSPH IV SUSP: 0.5000 mL | Freq: Once | INTRAVENOUS | Status: DC

## 2010-11-07 ENCOUNTER — Telehealth: Payer: Self-pay | Admitting: Cardiology

## 2010-11-07 ENCOUNTER — Encounter (HOSPITAL_COMMUNITY): Payer: Self-pay | Admitting: Cardiology

## 2010-11-07 NOTE — Telephone Encounter (Signed)
Returning your call. °

## 2010-11-07 NOTE — Telephone Encounter (Signed)
Dr Shirlee Latch had referred pt to a neurologist when he saw her 10/17/10. She states she was told she would get a call from the neurologists' office with an appt. She still has not heard about an appt with the neurologist.

## 2010-11-09 ENCOUNTER — Telehealth: Payer: Self-pay | Admitting: *Deleted

## 2010-11-09 ENCOUNTER — Encounter: Payer: Self-pay | Admitting: Cardiology

## 2010-11-09 NOTE — Progress Notes (Signed)
Cardiac Rehab Progress Report  Orientation:  05/16/2010 Graduate Date:  11/03/2010 Discharge Date:  11/03/2010  Cardiologist: Violeta Gelinas, [place] Family JY:NWGNF, Ova [place] Time: 11:00  A.  Exercise Program:  Tolerates exercise @ 1.3 METS for 15 minutes  B.  Mental Health:  Good mental attitude and Other significant stress: problems with Blood Sugars  C.  Education/Instruction/Skills  Accurately checks own pulse.  Rest:  79  Exercise: 89 D.  Nutrition/Weight Control/Body Composition:  Adherence to prescribed nutrition program: fair   E.  Blood Lipids    Lab Results  Component Value Date   CHOL  Value: 95        ATP III CLASSIFICATION:  <200     mg/dL   Desirable  621-308  mg/dL   Borderline High  >=657    mg/dL   High        84/09/9627     Lab Results  Component Value Date   TRIG 110 03/28/2010     Lab Results  Component Value Date   HDL 44 03/28/2010     Lab Results  Component Value Date   CHOLHDL 2.2 03/28/2010     No results found for this basename: LDLDIRECT      F.  Lifestyle Changes:  Making positive lifestyle changes  G.  Symptoms noted with exercise:  Asymptomatic  Report Completed By:  Angelica Pou  Comments:  Patient does very well with exercising, when not having problems with Blood sugars.

## 2010-11-09 NOTE — Progress Notes (Signed)
Cardiac Rehab Progress Report  Orientation: 05/16/2010 Graduate Date:  11/03/2010 Discharge Date:  11/03/2010  Cardiologist: Dr. Shirlee Latch, DaltonFamily MD:  Adrian Prince Class Time:  11:00  A.  Exercise Program:  Tolerates exercise @ 1.4 METS for15 minutes  B.  Mental Health:  Good mental attitude  C.  Education/Instruction/Skills  Accurately checks own pulse.  Rest:  89  Exercise:87 and Knows THR for exercise RHR + 30  D.  Nutrition/Weight Control/Body Composition:  Adherence to prescribed nutrition program: good   E.  Blood Lipids    Lab Results  Component Value Date   CHOL  Value: 95        ATP III CLASSIFICATION:  <200     mg/dL   Desirable  956-213  mg/dL   Borderline High  >=086    mg/dL   High        57/11/4694     Lab Results  Component Value Date   TRIG 110 03/28/2010     Lab Results  Component Value Date   HDL 44 03/28/2010     Lab Results  Component Value Date   CHOLHDL 2.2 03/28/2010     No results found for this basename: LDLDIRECT      F.  Lifestyle Changes:  Making positive lifestyle changes  G.  Symptoms noted with exercise:  Asymptomatic  Report Completed By:  Angelica Pou  Comments:  Patient has improved some since coming to Rehab. States" I'm going to keep up with my exercise at home

## 2010-11-09 NOTE — Telephone Encounter (Signed)
Appt. With Dr. Logan Bores 11/27/10 @ 11:30

## 2010-11-09 NOTE — Progress Notes (Signed)
Cardiac Rehab Progress Report  Orientation:05/16/2010 Graduate Date:  11/03/2010 Discharge Date:  11/03/2010  Cardiologist: Dr. Marca Ancona Family MD:  South,Stephen Class Time:  11:00  A.  Exercise Program:  Tolerates exercise @ 1.5 METS for 15 minutes and Discharged to home exercise program.  Anticipated compliance:  excellent  B.  Mental Health:  Good mental attitude  C.  Education/Instruction/Skills  Accurately checks own pulse.  Rest:  79  Exercise:  87, Knows THR for exercise  RHR+30, Uses Perceived Exertion Scale and/or Dyspnea Scale and Attended all education classes  D.  Nutrition/Weight Control/Body Composition:  Adherence to prescribed nutrition program: good   E.  Blood Lipids    Lab Results  Component Value Date   CHOL  Value: 95        ATP III CLASSIFICATION:  <200     mg/dL   Desirable  161-096  mg/dL   Borderline High  >=045    mg/dL   High        40/12/8117     Lab Results  Component Value Date   TRIG 110 03/28/2010     Lab Results  Component Value Date   HDL 44 03/28/2010     Lab Results  Component Value Date   CHOLHDL 2.2 03/28/2010     No results found for this basename: LDLDIRECT      F.  Lifestyle Changes:  Making positive lifestyle changes  G.  Symptoms noted with exercise:  Asymptomatic  Report Completed By:  Angelica Pou  Comments: Patient did very good while in rehab. Plans to continue to exercise by walking outside as weather permits

## 2010-11-16 ENCOUNTER — Encounter: Payer: Self-pay | Admitting: Cardiology

## 2010-11-21 ENCOUNTER — Encounter: Payer: Self-pay | Admitting: Internal Medicine

## 2010-11-21 ENCOUNTER — Ambulatory Visit (INDEPENDENT_AMBULATORY_CARE_PROVIDER_SITE_OTHER): Payer: Medicaid Other | Admitting: Cardiology

## 2010-11-21 ENCOUNTER — Ambulatory Visit (INDEPENDENT_AMBULATORY_CARE_PROVIDER_SITE_OTHER): Payer: Medicaid Other | Admitting: Internal Medicine

## 2010-11-21 VITALS — BP 112/72 | HR 77 | Temp 98.0°F | Ht 64.0 in | Wt 158.5 lb

## 2010-11-21 DIAGNOSIS — E785 Hyperlipidemia, unspecified: Secondary | ICD-10-CM

## 2010-11-21 DIAGNOSIS — R0602 Shortness of breath: Secondary | ICD-10-CM

## 2010-11-21 DIAGNOSIS — I639 Cerebral infarction, unspecified: Secondary | ICD-10-CM

## 2010-11-21 DIAGNOSIS — L03213 Periorbital cellulitis: Secondary | ICD-10-CM | POA: Insufficient documentation

## 2010-11-21 DIAGNOSIS — I6350 Cerebral infarction due to unspecified occlusion or stenosis of unspecified cerebral artery: Secondary | ICD-10-CM

## 2010-11-21 DIAGNOSIS — I2510 Atherosclerotic heart disease of native coronary artery without angina pectoris: Secondary | ICD-10-CM

## 2010-11-21 DIAGNOSIS — H00039 Abscess of eyelid unspecified eye, unspecified eyelid: Secondary | ICD-10-CM

## 2010-11-21 DIAGNOSIS — I5022 Chronic systolic (congestive) heart failure: Secondary | ICD-10-CM

## 2010-11-21 LAB — BASIC METABOLIC PANEL
BUN: 20 mg/dL (ref 6–23)
Calcium: 9.3 mg/dL (ref 8.4–10.5)
Creatinine, Ser: 1.2 mg/dL (ref 0.4–1.2)
GFR: 49.59 mL/min — ABNORMAL LOW (ref >60.00)
Glucose, Bld: 294 mg/dL — ABNORMAL HIGH (ref 70–99)

## 2010-11-21 MED ORDER — CARVEDILOL 12.5 MG PO TABS: 12.5000 mg | ORAL_TABLET | Freq: Two times a day (BID) | ORAL | Status: DC

## 2010-11-21 NOTE — Assessment & Plan Note (Signed)
Patient with preseptal cellulitis.  Completed a course of Augmentin.  Today presents and  It is resolved.  Despite culture, clinical resolution is the goal and therefore no indication for other antibiotics.  If it returns, I would use Bactrim DS 2 tabs BID or clindamycin but at this time no longer has cellulitis.   Patient may return PRN and was instructed to call if she begins to develop similar symptoms.

## 2010-11-21 NOTE — Patient Instructions (Signed)
Increase Coreg(carvedilol) to 12.5mg  twice a day.  Lab today--BMP/BNP 428.22 414.01  Schedule an appointment for an event monitor.  Schedule an appointment to see Dr Shirlee Latch in 2 months.

## 2010-11-21 NOTE — Progress Notes (Signed)
  Subjective:    Patient ID: [name], female    DOB: [birth year], 50 y.o.   MRN: [id number]  HPI diabetic female with hx of CAD, CVA and recently diagnosed with preseptal cellulitis.  Patient had noted some redness under left eye and discharge (caking) and saw ophthalmology.  She was diagnosed with preseptal cellulitis and started empirically on Augmentin.  She though denies having had a fever or chills.  She did have a culture of the pus checked which grew out CA-MRSA but despite that she had improvement of the cellulitis.  She today presents having completed a course of antibiotics and feels well.  She tolerated the antibiotics.      Review of Systems  Constitutional: Negative.   HENT: Negative.   Eyes: Negative.   Respiratory: Negative.   Cardiovascular: Negative.   Gastrointestinal: Negative.        Objective:   Physical Exam  Constitutional: She appears well-developed and well-nourished. No distress.  HENT:  Mouth/Throat: No oropharyngeal exudate.  Eyes: Conjunctivae and EOM are normal. Pupils are equal, round, and reactive to light. Right eye exhibits no discharge. Left eye exhibits no discharge.  Cardiovascular: Normal rate, regular rhythm and normal heart sounds.   No murmur heard. Pulmonary/Chest: Effort normal and breath sounds normal. No respiratory distress.  Abdominal: Soft. Bowel sounds are normal. She exhibits no distension.  Lymphadenopathy:    She has no cervical adenopathy.  Skin: Skin is warm and dry. No rash noted. No erythema.       Left eye without erythema specifically           Assessment & Plan:   No problem-specific assessment & plan notes found for this encounter.

## 2010-11-22 ENCOUNTER — Encounter: Payer: Self-pay | Admitting: Cardiology

## 2010-11-22 LAB — BRAIN NATRIURETIC PEPTIDE: Pro B Natriuretic peptide (BNP): 173 pg/mL — ABNORMAL HIGH (ref 0.0–100.0)

## 2010-11-22 NOTE — Assessment & Plan Note (Signed)
Goal LDL <70 

## 2010-11-22 NOTE — Assessment & Plan Note (Signed)
Occurred while on aspirin and Plavix.  Some residual right arm weakness.  Carotid dopplers showed no significant disease and limited echo to look for LV apical thrombus was unremarkable.  She is going to wear a 3-week event monitor to look for paroxysmal atrial fibrillation as a possible cause of the stroke.  She is going to see a neurologist.

## 2010-11-22 NOTE — Progress Notes (Signed)
PCP: Dr. Evlyn Kanner  50 yo with history of Type I diabetes, CAD, and ischemic cardiomyopathy presents for cardiology followup.  She had a prolonged admission with DKA and systolic CHF in 12/11.  Cardiac enzymes were found to be elevated and left heart cath showed occluded LAD, which appeared chronic at the time of cath.  Echo showed peri-apical akinesis with EF 30%.  Management was complicated by orthostatic hypotension causing syncope.  This was likely due to combination of low cardiac output and diabetic autonomic neuropathy.  This limited titration of cardiac medications initially. Repeat echo (1/12) showed EF 40-45% (improved).  She was readmitted with DKA in 3/12, and cardiac enzymes were noted to be quite elevated with troponin peaking at 24 and CKMB at 69.  Repeat LHC was done, actually showing no significant difference from the 12/11 study.    In 5/12, she noted weakness one day in her right leg and arm.  She had a head MRI done, showing a small acute left parietal infarction.  This stroke occurred while on ASA and Plavix.  She has improved but her left arm still has some weakness. Carotid dopplers showed no significant carotid stenosis.  I did a limited echo to look for LV thrombus, and this was not seen.  I had wanted her to wear a 3 week event monitor to look for atrial fibrillation, but she was not able to do this due to lack of insurance. She is now on Medicaid and should be able to do the monitor.  She is also going to see a neurologist next week.   From a cardiopulmonary standpoint, she seems to be doing well.  No significant exertional dyspnea.  She is able to climb a flight of steps.  No chest pain, no orthopnea.  She has finished cardiac rehab.  Earlier this month, labs showed K 6.2. Spironolactone and KCl were held.  Weight is up about 3 lbs since last appointment.   Labs (12/11): K 3.7, creatinine 0.88, BNP 612, LDL 29, HDL 44 Labs (1/12): digoxin 1.0, BNP 199, K 4.4, creatinine 0.9 Labs  (5/12): digoxin 0.4 Labs (6/12): K 4.2, creatinine 0.99 Labs (7/12): K 6.2, creatinine 1.3 (KCl and spironolactone held).   Allergies (verified):  1)  Reglan 2)  Erythromycin  Past Medical History: 1. Hypertension: However, after MI had orthostatic hypotension.  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 CAD: NSTEMI 12/11.  Cardiac catheterization (12/11) showing LAD totalled in the midportion, diagonal 1 70% stenosed (small 2-mm vessel), circumflex nonobstructive, OM1 40%, OM2 30%, RCA small and no significant disease.  LAD occlusion was calcified and appeared chronic at the time of catheterization so no intervention.  Patient readmitted with DKA in 3/12, troponin found to be 24 and CKMB 69.  Repeat LHC showed no change from 12/11 study.  5. Ischemic cardiomyopathy: Echo (12/11) with EF 30% and periapical akinesis, no significant MR, RV looked normal.  QRS is not wide on ECG.  Echo (1/12): EF 40-45%, severe hypokinesis of the anteroseptal wall, apical inferior wall, inferoseptal wall, and apex.  6. Orthostatic hypotension: Syncopal episode in 12/11 likely due to this.  Probably from combination of depressed cardiac output and diabetic autonomic neuropathy.  7. Type 1 insulin-dependent diabetes. History of DKA.  8. Hyperlipidemia.  9. Diabetic retinopathy.  10. Gastroparesis.  11. Diabetic nephropathy  12. Depression.  13. 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  Carotid dopplers (7/12) with no significant disease.  Limited echo in 7/12 did not show  an LV apical thrombus.   Family History: No FH of Colon Cancer: Family History of Pancreatic Cancer: Father deceased 58 No premature CAD  Social History: Divorced, 1 boy unemployed, lives in [place] Patient has never smoked.  Alcohol Use - no Daily Caffeine Use 1 cup/day Illicit Drug Use - no  Review of Systems        All systems reviewed and negative except as per HPI.   Current Outpatient Prescriptions  Medication  Sig Dispense Refill  . ALPRAZolam (XANAX) 0.5 MG tablet Take 0.5 mg by mouth at bedtime as needed.        [name] aspirin EC 81 MG tablet Take 1 tablet (81 mg total) by mouth daily.      . Calcium Carbonate-Vit D-Min 600-200 MG-UNIT TABS Take 1 tablet by mouth daily.        . citalopram (CELEXA) 20 MG tablet Take 20 mg by mouth daily.        . clopidogrel (PLAVIX) 75 MG tablet Take 75 mg by mouth daily.        . digoxin (LANOXIN) 0.125 MG tablet Take 125 mcg by mouth every other day.       . enalapril (VASOTEC) 5 MG tablet Take 5 mg by mouth 2 (two) times daily.        . ferrous sulfate 325 (65 FE) MG tablet Take 325 mg by mouth daily with breakfast.        . furosemide (LASIX) 20 MG tablet Take 20 mg by mouth daily.       . insulin glargine (LANTUS) 100 UNIT/ML injection Inject into the skin as directed.       . insulin lispro (HUMALOG KWIKPEN) 100 UNIT/ML injection Inject into the skin. Sliding scale        . Multiple Vitamins-Iron (QC DAILY MULTIVITAMINS/IRON) TABS Take 1 tablet by mouth daily.        . rosuvastatin (CRESTOR) 40 MG tablet Take 1 tablet (40 mg total) by mouth daily.  30 tablet  11  . terbinafine (LAMISIL AT) 1 % cream Apply topically 2 (two) times daily.        [name] DISCONTD: enalapril (VASOTEC) 5 MG tablet Take 1 tablet (5 mg total) by mouth daily.  60 tablet  6  . carvedilol (COREG) 12.5 MG tablet Take 1 tablet (12.5 mg total) by mouth 2 (two) times daily.  60 tablet  6    BP 126/66  Pulse 65  Resp 14  Ht 5\' 4"  (1.626 m)  Wt 157 lb (71.215 kg)  BMI 26.95 kg/m2  LMP 10/11/2010 General:  Well developed, well nourished, in no acute distress. Head:  Annular skin lesion on forehead.  Neck:  Neck supple, no JVD. No masses, thyromegaly or abnormal cervical nodes. Lungs:  Clear bilaterally to auscultation and percussion. Heart:  Non-displaced PMI, chest non-tender; regular rate and rhythm, S1, S2 without murmurs, rubs or gallops. Carotid upstroke normal, no bruit. Pedals normal  pulses. No edema.  Abdomen:  Bowel sounds positive; abdomen soft and non-tender without masses, organomegaly, or hernias noted. No hepatosplenomegaly. Extremities:  No clubbing or cyanosis. Neurologic:  Alert and oriented x 3. Right arm is weak.  Psych:  Normal affect.

## 2010-11-22 NOTE — Assessment & Plan Note (Signed)
Stable coronary disease with occluded mid-LAD.  She had quite elevated cardiac enzymes when she was hospitalized with DKA in 3/12, but her coronary disease was unchanged.  I am unsure what caused the enzyme elevation.  Perhaps she had a hypoperfusion event early in the course of DKA with demand cardiac ischemia.   - Continue ASA 81, continue Plavix.   - Continue Coreg, enalapril, statin.

## 2010-11-22 NOTE — Assessment & Plan Note (Signed)
Stable and well-compensated.  NYHA class II symptoms with no evidence for volume overload.   - Continue digoxin every other day and enalapril.  - Increase Coreg to 12.5 mg bid.  - Continue to hold spironolactone given hyperkalemia on last lab draw. - Check BMET/BNP today.

## 2010-11-27 ENCOUNTER — Telehealth: Payer: Self-pay | Admitting: Cardiology

## 2010-11-27 ENCOUNTER — Other Ambulatory Visit: Payer: Self-pay | Admitting: *Deleted

## 2010-11-27 MED ORDER — FUROSEMIDE 20 MG PO TABS: 20.0000 mg | ORAL_TABLET | Freq: Every day | ORAL | Status: DC

## 2010-11-27 MED ORDER — ENALAPRIL MALEATE 5 MG PO TABS: 5.0000 mg | ORAL_TABLET | Freq: Two times a day (BID) | ORAL | Status: DC

## 2010-11-27 NOTE — Telephone Encounter (Signed)
Per pt call, pt said she tried to call RX into pharmacy and pharmacy said RX was not good any longer. Pt said she needs a refill of RX enalapril 5mg , 2x/day, called into [facility] at [facility] [phone]

## 2010-12-20 ENCOUNTER — Telehealth: Payer: Self-pay | Admitting: *Deleted

## 2010-12-20 NOTE — Telephone Encounter (Signed)
Dr Shirlee Latch reviewed end of summary monitor 11/23/10-12/13/10. No atrial fibrillation, only NSR. I notified pt of results.

## 2010-12-26 ENCOUNTER — Telehealth: Payer: Self-pay | Admitting: Cardiology

## 2010-12-26 NOTE — Telephone Encounter (Signed)
Pt calling c/o feet swelling. Pt would like to discuss further as to what to do.

## 2010-12-26 NOTE — Telephone Encounter (Signed)
I talked with pt. Pt states for the last 2-3 days she has had increase in edema in both feet and legs. She states her hands also feel a little puffy,but she denies increase in weight or SOB. Pt denies any other changes. I reviewed with Dr Shirlee Latch. He recommended pt increase Lasix to 40mg  daily for 4 days. Pt not taking KCL supplement , but will increase K rich foods while she is on increased Lasix dose.

## 2010-12-27 ENCOUNTER — Encounter: Payer: Self-pay | Admitting: Internal Medicine

## 2011-01-03 ENCOUNTER — Telehealth: Payer: Self-pay | Admitting: Cardiology

## 2011-01-03 DIAGNOSIS — I5022 Chronic systolic (congestive) heart failure: Secondary | ICD-10-CM

## 2011-01-03 NOTE — Telephone Encounter (Signed)
Pt calling re feet swelling, now has question

## 2011-01-03 NOTE — Telephone Encounter (Signed)
I talked with pt. Pt increased Lasix to 40mg  daily from 12/26/10-12/30/10. Pt states the edema in her feet resolved. . For the last day or so pt has noticed an increase in edema not as bad as before increased Lasix dose. Pt on Lasix 20mg  daily since 12/31/10.Pt denies increase in SOB, weight gain, or other swelling. Pt requesting to increase Lasix back to 40mg  daily until appt with Dr Shirlee Latch 01/17/11. I reviewed with Dr Shirlee Latch. OK to take Lasix 40mg  daily, BMET/BNP at time of appt with Dr Shirlee Latch 01/17/11. I discussed with pt.

## 2011-01-17 ENCOUNTER — Other Ambulatory Visit (INDEPENDENT_AMBULATORY_CARE_PROVIDER_SITE_OTHER): Payer: Medicaid Other | Admitting: *Deleted

## 2011-01-17 ENCOUNTER — Ambulatory Visit (INDEPENDENT_AMBULATORY_CARE_PROVIDER_SITE_OTHER): Payer: Medicaid Other | Admitting: Cardiology

## 2011-01-17 ENCOUNTER — Encounter: Payer: Self-pay | Admitting: Cardiology

## 2011-01-17 VITALS — BP 107/66 | HR 65 | Ht 64.0 in | Wt 164.0 lb

## 2011-01-17 DIAGNOSIS — I6350 Cerebral infarction due to unspecified occlusion or stenosis of unspecified cerebral artery: Secondary | ICD-10-CM

## 2011-01-17 DIAGNOSIS — I5022 Chronic systolic (congestive) heart failure: Secondary | ICD-10-CM

## 2011-01-17 DIAGNOSIS — I639 Cerebral infarction, unspecified: Secondary | ICD-10-CM

## 2011-01-17 DIAGNOSIS — R0602 Shortness of breath: Secondary | ICD-10-CM

## 2011-01-17 DIAGNOSIS — E785 Hyperlipidemia, unspecified: Secondary | ICD-10-CM

## 2011-01-17 DIAGNOSIS — I2510 Atherosclerotic heart disease of native coronary artery without angina pectoris: Secondary | ICD-10-CM

## 2011-01-17 LAB — BASIC METABOLIC PANEL
BUN: 23 mg/dL (ref 6–23)
Calcium: 9.4 mg/dL (ref 8.4–10.5)
Creatinine, Ser: 1.2 mg/dL (ref 0.4–1.2)

## 2011-01-17 LAB — BRAIN NATRIURETIC PEPTIDE: Pro B Natriuretic peptide (BNP): 245 pg/mL — ABNORMAL HIGH (ref 0.0–100.0)

## 2011-01-17 MED ORDER — ENALAPRIL MALEATE 5 MG PO TABS: ORAL_TABLET | ORAL | Status: DC

## 2011-01-17 NOTE — Patient Instructions (Signed)
Increase enalapril to 7.5mg  twice a day. This will be one and one-half 5mg  enalapril tablets twice a day.  Lab today--BMP/BNP 428.22  Lab in 2 weeks--BMP--you have the order. Please fax the results to [phone] Dr Shirlee Latch.  Your physician recommends that you schedule a follow-up appointment in: 3 months with Dr Shirlee Latch.

## 2011-01-18 NOTE — Assessment & Plan Note (Signed)
Occurred while on aspirin and Plavix.  Some residual right arm weakness.  Carotid dopplers showed no significant disease and limited echo to look for LV apical thrombus was unremarkable.  3-week monitor did not show atrial fibrillation.  She saw a neurologist, I await the report.

## 2011-01-18 NOTE — Progress Notes (Signed)
PCP: Dr. Evlyn Kanner  50 yo with history of Type I diabetes, CAD, and ischemic cardiomyopathy presents for cardiology followup.  She had a prolonged admission with DKA and systolic CHF in 12/11.  Cardiac enzymes were found to be elevated and left heart cath showed occluded LAD, which appeared chronic at the time of cath.  Echo showed peri-apical akinesis with EF 30%.  Management was complicated by orthostatic hypotension causing syncope.  This was likely due to combination of low cardiac output and diabetic autonomic neuropathy.  This limited titration of cardiac medications initially. Repeat echo (1/12) showed EF 40-45% (improved).  She was readmitted with DKA in 3/12, and cardiac enzymes were noted to be quite elevated with troponin peaking at 24 and CKMB at 69.  Repeat LHC was done, actually showing no significant difference from the 12/11 study.    In 5/12, she noted weakness one day in her right leg and arm.  She had a head MRI done, showing a small acute left parietal infarction.  This stroke occurred while on ASA and Plavix.  She has improved but her left arm still has some weakness. Carotid dopplers showed no significant carotid stenosis.  I did a limited echo to look for LV thrombus, and this was not seen.  3 week event monitor showed no atrial fibrillation.  She did see a neurologist but I do not have a report.   From a cardiopulmonary standpoint, she seems to be doing well.  No significant exertional dyspnea.  She is able to climb a flight of steps.  She pushes her grand-daughter in a stroller.  No chest pain, no orthopnea.  Weight is up about 7 lbs but she denies any changes in her breathing or lower extremity edema.    Labs (12/11): K 3.7, creatinine 0.88, BNP 612, LDL 29, HDL 44 Labs (1/12): digoxin 1.0, BNP 199, K 4.4, creatinine 0.9 Labs (5/12): digoxin 0.4 Labs (6/12): K 4.2, creatinine 0.99 Labs (7/12): K 6.2, creatinine 1.3 (KCl and spironolactone held).  Labs (7/12), repeat: K 4.8,  creatinine 1.2, BNP 173  Allergies (verified):  1)  Reglan 2)  Erythromycin  Past Medical History: 1. Hypertension: However, after MI had orthostatic hypotension.  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 CAD: NSTEMI 12/11.  Cardiac catheterization (12/11) showing LAD totalled in the midportion, diagonal 1 70% stenosed (small 2-mm vessel), circumflex nonobstructive, OM1 40%, OM2 30%, RCA small and no significant disease.  LAD occlusion was calcified and appeared chronic at the time of catheterization so no intervention.  Patient readmitted with DKA in 3/12, troponin found to be 24 and CKMB 69.  Repeat LHC showed no change from 12/11 study.  5. Ischemic cardiomyopathy: Echo (12/11) with EF 30% and periapical akinesis, no significant MR, RV looked normal.  QRS is not wide on ECG.  Echo (1/12): EF 40-45%, severe hypokinesis of the anteroseptal wall, apical inferior wall, inferoseptal wall, and apex.  6. Orthostatic hypotension: Syncopal episode in 12/11 likely due to this.  Probably from combination of depressed cardiac output and diabetic autonomic neuropathy.  7. Type 1 insulin-dependent diabetes. History of DKA.  8. Hyperlipidemia.  9. Diabetic retinopathy.  10. Gastroparesis.  11. Diabetic nephropathy  12. Depression.  13. 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  Carotid dopplers (7/12) with no significant disease.  Limited echo in 7/12 did not show an LV apical thrombus.  3-week event monitor in 7/12 did not show any runs of atrial fibrillation.   Family History: No FH  of Colon Cancer: Family History of Pancreatic Cancer: Father deceased 60 No premature CAD  Social History: Divorced, 1 boy unemployed, lives in [place] Patient has never smoked.  Alcohol Use - no Daily Caffeine Use 1 cup/day Illicit Drug Use - no  Review of Systems        All systems reviewed and negative except as per HPI.   Current Outpatient Prescriptions  Medication Sig Dispense Refill  .  ALPRAZolam (XANAX) 0.5 MG tablet Take 0.5 mg by mouth at bedtime as needed.        [name] aspirin EC 81 MG tablet Take 1 tablet (81 mg total) by mouth daily.      . Calcium Carbonate-Vit D-Min 600-200 MG-UNIT TABS Take 1 tablet by mouth daily.        . carvedilol (COREG) 12.5 MG tablet Take 1 tablet (12.5 mg total) by mouth 2 (two) times daily.  60 tablet  6  . citalopram (CELEXA) 20 MG tablet Take 20 mg by mouth daily.        . clopidogrel (PLAVIX) 75 MG tablet Take 75 mg by mouth daily.        . digoxin (LANOXIN) 0.125 MG tablet Take 125 mcg by mouth every other day.       . ferrous sulfate 325 (65 FE) MG tablet Take 325 mg by mouth daily with breakfast.        . furosemide (LASIX) 40 MG tablet Lasix 40mg  daily--pt is taking two 20mg  tablets daily      . insulin glargine (LANTUS) 100 UNIT/ML injection Inject into the skin as directed.       . insulin lispro (HUMALOG KWIKPEN) 100 UNIT/ML injection Inject into the skin. Sliding scale        . Multiple Vitamins-Iron (QC DAILY MULTIVITAMINS/IRON) TABS Take 1 tablet by mouth daily.        . rosuvastatin (CRESTOR) 40 MG tablet Take 1 tablet (40 mg total) by mouth daily.  30 tablet  11  . terbinafine (LAMISIL AT) 1 % cream Apply topically 2 (two) times daily.        . enalapril (VASOTEC) 5 MG tablet Take 1 and 1/2 tablets twice a day  90 tablet  6  . DISCONTD: enalapril (VASOTEC) 5 MG tablet Take 1 tablet (5 mg total) by mouth daily.  60 tablet  6    BP 107/66  Pulse 65  Ht 5\' 4"  (1.626 m)  Wt 164 lb (74.39 kg)  BMI 28.15 kg/m2 General:  Well developed, well nourished, in no acute distress. Head:  Annular skin lesion on forehead.  Neck:  Neck supple, no JVD. No masses, thyromegaly or abnormal cervical nodes. Lungs:  Clear bilaterally to auscultation and percussion. Heart:  Non-displaced PMI, chest non-tender; regular rate and rhythm, S1, S2 without murmurs, rubs or gallops. Carotid upstroke normal, no bruit. Pedals normal pulses. No edema.    Abdomen:  Bowel sounds positive; abdomen soft and non-tender without masses, organomegaly, or hernias noted. No hepatosplenomegaly. Extremities:  No clubbing or cyanosis. Neurologic:  Alert and oriented x 3. Right arm is weak.  Psych:  Normal affect.

## 2011-01-18 NOTE — Assessment & Plan Note (Signed)
Will get copy of recent lipids from PCP.  Goal LDL<70.

## 2011-01-18 NOTE — Assessment & Plan Note (Signed)
Weight is up but she appears euvolemic with NYHA class II symptoms.  Continue current doses of Lasix, Coreg, and digoxin.  I will increase enalapril to 7.5 mg bid.  BMET/BNP in 2 wks.  Off spironolactone as she developed hyperkalemia while taking this medication.  Will need to follow K closely with increase in enalapril.

## 2011-01-18 NOTE — Assessment & Plan Note (Signed)
Stable coronary disease with occluded mid-LAD.  She had quite elevated cardiac enzymes when she was hospitalized with DKA in 3/12, but her coronary disease was unchanged.  I am unsure what caused the enzyme elevation.  Perhaps she had a hypoperfusion event early in the course of DKA with demand cardiac ischemia.  No recent ischemic symptoms.   - Continue ASA 81, continue Plavix.   - Continue Coreg, enalapril, statin.

## 2011-01-19 ENCOUNTER — Telehealth: Payer: Self-pay | Admitting: Cardiology

## 2011-01-19 MED ORDER — FUROSEMIDE 40 MG PO TABS: 40.0000 mg | ORAL_TABLET | Freq: Every day | ORAL | Status: DC

## 2011-01-19 NOTE — Telephone Encounter (Signed)
Pt needs refill of furosemide , uses [facility]

## 2011-01-25 ENCOUNTER — Telehealth: Payer: Self-pay | Admitting: Cardiology

## 2011-01-25 NOTE — Telephone Encounter (Signed)
I talked with pt. 

## 2011-01-25 NOTE — Telephone Encounter (Signed)
Pt called and would like for you to call her back.  She has been given note for blood work where she lives.  Wants to know if she still needs chol still needs to be checked if it has already been checked in August. Pt feels tired and sluggish, could this be her medication?

## 2011-01-29 ENCOUNTER — Encounter: Payer: Self-pay | Admitting: Cardiology

## 2011-01-31 ENCOUNTER — Encounter: Payer: Self-pay | Admitting: Cardiology

## 2011-02-16 NOTE — Progress Notes (Signed)
Cardiac Rehab Progress Report  Orientation: 05/16/2010 Graduate Date:  11/03/2010 Discharge Date:  11/03/2010  Cardiologist:  Dr. Violeta Gelinas Family MD:  Dr. Laurene Footman Class Time:  09:30   A.  Exercise Program:  Tolerates exercise @ 1.5 METS for 15 minutes and Discharged to home exercise program.  Anticipated compliance:  excellent  B.  Mental Health:  Good mental attitude  C.  Education/Instruction/Skills  Knows THR for exercise, Uses Perceived Exertion Scale and/or Dyspnea Scale and Attended all education classes  D.  Nutrition/Weight Control/Body Composition:  Adherence to prescribed nutrition program: good   E.  Blood Lipids    Lab Results  Component Value Date   CHOL  Value: 95        ATP III CLASSIFICATION:  <200     mg/dL   Desirable  045-409  mg/dL   Borderline High  >=811    mg/dL   High        91/07/7827     Lab Results  Component Value Date   TRIG 110 03/28/2010     Lab Results  Component Value Date   HDL 44 03/28/2010     Lab Results  Component Value Date   CHOLHDL 2.2 03/28/2010     No results found for this basename: LDLDIRECT      F.  Lifestyle Changes:  Making positive lifestyle changes  G.  Symptoms noted with exercise:  Asymptomatic  Report Completed By:  Angelica Pou  Comments: Patient is DC'd from [facility]  Cardiac rehab program today 11/03/2010 36 sessions. She achieved LTG of 30 minutes of aerobic exercise at a Max Met Level of 1.5. Mrs Garlick has progressed nicely to 30 minutes of aerobic exercise, and strength and flexibility exercises. All patients vital signs are WNL. Patient has met with dietician. DC instructions have been discussed and verbalized understanding. We will contact patient at 1 month, 6 months and 1 year. Patient had no C/O of abnormal S/S or pain

## 2011-02-16 NOTE — Progress Notes (Signed)
Cardiac Rehab Progress Report  Orientation: 05/16/2010  Graduate Date:  tbd Discharge Date:  tbd  Cardiologist:  Dr. Violeta Gelinas Family MD:  Dr. Ardyth Harps Class Time:  09:30  A.  Exercise Program:  Tolerates exercise @ 1.4 METS for 15 minutes  B.  Mental Health:  Good mental attitude  C.  Education/Instruction/Skills  Knows THR for exercise and Uses Perceived Exertion Scale and/or Dyspnea Scale  D.  Nutrition/Weight Control/Body Composition:  Adherence to prescribed nutrition program: good   E.  Blood Lipids    Lab Results  Component Value Date   CHOL  Value: 95        ATP III CLASSIFICATION:  <200     mg/dL   Desirable  409-811  mg/dL   Borderline High  >=914    mg/dL   High        78/05/9560     Lab Results  Component Value Date   TRIG 110 03/28/2010     Lab Results  Component Value Date   HDL 44 03/28/2010     Lab Results  Component Value Date   CHOLHDL 2.2 03/28/2010     No results found for this basename: LDLDIRECT      F.  Lifestyle Changes:  Making positive lifestyle changes  G.  Symptoms noted with exercise:  Asymptomatic  Report Completed By:  Angelica Pou  Comments:  This is patients halfway session #18. She achieved a peak mets of 1.4. Her resting HR was 67, and resting BP was 120/60. Her peak HR was 76 and her Peak BP was 120/62

## 2011-02-16 NOTE — Progress Notes (Signed)
Cardiac Rehab Progress Report  Orientation: 05/16/2010 Graduate Date:  tbd Discharge Date:  tbd  Cardiologist:  Dr. Shirlee Latch Family MD:  South,Steven Class Time:  09:30  A.  Exercise Program:  Tolerates exercise @ 1.3 METS for 15 minutes  B.  Mental Health:  Good mental attitude  C.  Education/Instruction/Skills  Knows THR for exercise and Uses Perceived Exertion Scale and/or Dyspnea Scale  D.  Nutrition/Weight Control/Body Composition:  Adherence to prescribed nutrition program: good   E.  Blood Lipids    Lab Results  Component Value Date   CHOL  Value: 95        ATP III CLASSIFICATION:  <200     mg/dL   Desirable  161-096  mg/dL   Borderline High  >=045    mg/dL   High        40/12/8117     Lab Results  Component Value Date   TRIG 110 03/28/2010     Lab Results  Component Value Date   HDL 44 03/28/2010     Lab Results  Component Value Date   CHOLHDL 2.2 03/28/2010     No results found for this basename: LDLDIRECT      F.  Lifestyle Changes:  Making positive lifestyle changes  G.  Symptoms noted with exercise:  Asymptomatic  Report Completed By:  Angelica Pou  Comments:  This is Patients 1st week report. She has done very well. She achieved a peak mets of 1.3. She rested as needed. Will  Send a report at her halfway report.

## 2011-02-23 ENCOUNTER — Telehealth: Payer: Self-pay | Admitting: Internal Medicine

## 2011-02-23 NOTE — Telephone Encounter (Signed)
Spoke with Malachi Bonds. She states the patient called them and Dr. Evlyn Kanner wants her seen by Dr. Juanda Chance. She will send her history and medication list but she does not have a recent OV note. Scheduled patient with Dr. Juanda Chance on 03/09/11 at 2:15 PM.

## 2011-03-09 ENCOUNTER — Ambulatory Visit (INDEPENDENT_AMBULATORY_CARE_PROVIDER_SITE_OTHER): Payer: Medicaid Other | Admitting: Internal Medicine

## 2011-03-09 ENCOUNTER — Encounter: Payer: Self-pay | Admitting: Internal Medicine

## 2011-03-09 VITALS — BP 100/54 | HR 60 | Ht 64.5 in | Wt 164.0 lb

## 2011-03-09 DIAGNOSIS — R197 Diarrhea, unspecified: Secondary | ICD-10-CM

## 2011-03-09 MED ORDER — DIPHENOXYLATE-ATROPINE 2.5-0.025 MG PO TABS: 1.0000 | ORAL_TABLET | ORAL | Status: DC

## 2011-03-09 NOTE — Patient Instructions (Addendum)
We have sent the following medications to your pharmacy for you to pick up at your convenience: Lomotil CC: Dr Evlyn Kanner,

## 2011-03-09 NOTE — Progress Notes (Signed)
[name] 03/30/1961 MRN [id number]    History of Present Illness:  This is a 50 year old white female with increased frequency of stools and incontinence which started approximately 5 months ago. Her usual bowel habits were regular till about May 2012.[name] She had an extensive anterior MI in November 2011 requiring a prolonged hospitalization during which she was started on several medications including Crestor and Coreg. She denies abdominal pain or blood per rectum. Her last colonoscopy in May 2010 was a normal exam. Her last upper endoscopy showed mild distal esophagitis. 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 She was diagnosed with gastroparesis in 75s. She currently denies any symptoms of gastric retention.   Past Medical History  Diagnosis Date  . Hypertension   . Vitamin D deficiency   . Coronary artery disease     NSTEMI 12/11. Cardiac catheterization (12/11) showing LAD totalled in the midportion, diagonal 70% stenosed (small 2-mm vessel), circumflex nonobstructive, OM1 40%, OM2 30%, RCA samll and no significat disease.  LAD occlusion was calcified and appeared chronic at the time of cathetrization so no intervention.  . Ischemic cardiomyopathy     Echo 12/11 with EF 30% and periapical akinesis, no significant MR. RV  looked normal. QRS is not wide on ECG.  Echo (1/12):  EF 40-45%, severe hypokinesis of the anteroseptal wall, apical inferior wall, inferoseptal wall, and apex.  . Orthostatic hypotension     sycopal episode in 12/11 likely due to this.  Probably from combination of depressed cardiac out put and diabetic autonomic neuropathy.  . Diabetes mellitus     Insulin dependant Type I   . Hyperlipidemia   . Diabetic nephropathy   . Diabetic retinopathy   . Gastroparesis   . Depression   . Systolic heart failure   . Iron deficiency anemia   . Hx MRSA infection   . Osteopenia   . Esophagitis   . Heart  attack    Past Surgical History  Procedure Date  . Appendectomy 1980  . Tubal ligation 1987  . Cesarean section 1984  . Refractive surgery     x3    reports that she has never smoked. She has never used smokeless tobacco. She reports that she does not drink alcohol or use illicit drugs. family history includes Diabetes in her maternal grandfather and Pancreatic cancer in her father.  There is no history of Colon cancer. Allergies  Allergen Reactions  . Erythromycin     REACTION: GI upset  . Metoclopramide Hcl     REACTION: fatique, depression        Review of Systems: Denies dysphagia, odynophagia, shortness of breath or chest pain  The remainder of the 10 point ROS is negative except as outlined in H&P   Physical Exam: General appearance  Well developed, in no distress. Eyes- non icteric. HEENT nontraumatic, normocephalic. Mouth no lesions, tongue papillated, no cheilosis. Neck supple without adenopathy, thyroid not enlarged, no carotid bruits, no JVD. Lungs Clear to auscultation bilaterally. Cor normal S1, normal S2, regular rhythm, no murmur,  quiet precordium. Abdomen: Soft nontender abdomen with normal active bowel sounds. No distention. Liver edge at costal margin. Rectal normal perianal area. Slightly decreased rectal sphincter tone . Moderate amount of soft Hemoccult negative stool. Extremities no pedal edema. Skin no lesions. Neurological alert and oriented x 3. Psychological normal mood and affect.  Assessment and Plan:   Problem #1 Change in bowel habits likely related to new  medications; both Crestor and Coreg can cause diarrhea. Diabetic visceral neuropathy may be a contributing factor. . We will start her on Lomotil 1 tablet every other day. She is up-to-date on her colonoscopy. Pt will ask her cardiologist about options in changing or adjusting  her cardiac medications to avoid diarrhea. She will call [name] if her diarrhea continues.Since the stools are not  watery, the stool studies would give a low yield.Consider flex sigmoid and random biopsies to r/o microscopic colitis   03/09/2011 [name]

## 2011-03-16 ENCOUNTER — Telehealth: Payer: Self-pay | Admitting: Cardiology

## 2011-03-16 NOTE — Telephone Encounter (Signed)
Reviewed with Dr Shirlee Latch. Dr Shirlee Latch recommended pt continue enalapril 7.5mg  twice a day as long as BP is OK. I talked with pt. Pt will continue enalalpril 7.5mg  bid. She will check her BP at home the next few days and call back if BP is low or she has lightheadedness or other symptoms.

## 2011-03-16 NOTE — Telephone Encounter (Signed)
I talked with [name]. [name] states that she saw Dr Evlyn Kanner 03/13/11. Dr Evlyn Kanner recommended [name] decrease enalapril to 7.5mg  daily from 7.5mg  twice a day. [name] is not sure why she was told to decrease dose. [name] denies lightheadedness, although the nurse at Dr Rinaldo Cloud office said her BP was low.  [name] did not have lab done at Dr Rinaldo Cloud office recently. [name] asking if OK with Dr Shirlee Latch to decrease enalapril. I will forward to Dr Shirlee Latch for review.

## 2011-03-16 NOTE — Telephone Encounter (Signed)
New message:  Pt called and has some questions about a medicine her diabetic doctor wants her to stop. Enalapril is the medication.  Dr, Evlyn Kanner wants her to take 1 daily not 2.  Please call her back and advise.

## 2011-03-17 ENCOUNTER — Emergency Department (HOSPITAL_COMMUNITY)
Admission: EM | Admit: 2011-03-17 | Discharge: 2011-03-17 | Disposition: A | Discharge status: Home / Self Care | Payer: Medicaid Other | Attending: Emergency Medicine | Admitting: Emergency Medicine

## 2011-03-17 ENCOUNTER — Encounter (HOSPITAL_COMMUNITY): Payer: Self-pay | Admitting: *Deleted

## 2011-03-17 ENCOUNTER — Other Ambulatory Visit: Payer: Self-pay

## 2011-03-17 DIAGNOSIS — E108 Type 1 diabetes mellitus with unspecified complications: Secondary | ICD-10-CM | POA: Insufficient documentation

## 2011-03-17 DIAGNOSIS — Z79899 Other long term (current) drug therapy: Secondary | ICD-10-CM | POA: Insufficient documentation

## 2011-03-17 DIAGNOSIS — R739 Hyperglycemia, unspecified: Secondary | ICD-10-CM

## 2011-03-17 DIAGNOSIS — E860 Dehydration: Secondary | ICD-10-CM | POA: Insufficient documentation

## 2011-03-17 DIAGNOSIS — I10 Essential (primary) hypertension: Secondary | ICD-10-CM | POA: Insufficient documentation

## 2011-03-17 DIAGNOSIS — I2510 Atherosclerotic heart disease of native coronary artery without angina pectoris: Secondary | ICD-10-CM | POA: Insufficient documentation

## 2011-03-17 DIAGNOSIS — Z794 Long term (current) use of insulin: Secondary | ICD-10-CM | POA: Insufficient documentation

## 2011-03-17 LAB — DIGOXIN LEVEL: Digoxin Level: 0.4 ng/mL — ABNORMAL LOW (ref 0.8–2.0)

## 2011-03-17 LAB — URINALYSIS, ROUTINE W REFLEX MICROSCOPIC
Glucose, UA: >1000 mg/dL — AB
Ketones, ur: NEGATIVE mg/dL
Protein, ur: 100 mg/dL — AB
Urobilinogen, UA: 0.2 mg/dL (ref 0.0–1.0)

## 2011-03-17 LAB — POCT I-STAT TROPONIN I: Troponin i, poc: 0.01 ng/mL (ref 0.00–0.08)

## 2011-03-17 LAB — CBC
Hemoglobin: 13.6 g/dL (ref 12.0–15.0)
MCH: 30.2 pg (ref 26.0–34.0)
MCHC: 33.7 g/dL (ref 30.0–36.0)
MCV: 89.4 fL (ref 78.0–100.0)
Platelets: 245 10*3/uL (ref 150–400)

## 2011-03-17 LAB — BASIC METABOLIC PANEL
BUN: 26 mg/dL — ABNORMAL HIGH (ref 6–23)
CO2: 26 mEq/L (ref 19–32)
Calcium: 9.6 mg/dL (ref 8.4–10.5)
GFR calc non Af Amer: 50 mL/min — ABNORMAL LOW (ref >90)
Glucose, Bld: 381 mg/dL — ABNORMAL HIGH (ref 70–99)

## 2011-03-17 LAB — URINE MICROSCOPIC-ADD ON

## 2011-03-17 LAB — DIFFERENTIAL
Basophils Relative: 0 % (ref 0–1)
Eosinophils Absolute: 0.2 10*3/uL (ref 0.0–0.7)
Eosinophils Relative: 2 % (ref 0–5)
Monocytes Relative: 5 % (ref 3–12)
Neutrophils Relative %: 81 % — ABNORMAL HIGH (ref 43–77)

## 2011-03-17 MED ORDER — INSULIN ASPART PROT & ASPART (70-30 MIX) 100 UNIT/ML ~~LOC~~ SUSP
6.0000 [IU] | Freq: Once | SUBCUTANEOUS | Status: AC
  Administered 2011-03-17: 6 [IU] via SUBCUTANEOUS
  Filled 2011-03-17: qty 3

## 2011-03-17 NOTE — ED Provider Notes (Signed)
History     CSN: [id number] Arrival date & time: 03/17/2011  2:17 PM   First MD Initiated Contact with Patient 03/17/11 1525      Chief Complaint  Patient presents with  . Hyperglycemia     Patient is a 50 y.o. female presenting with weakness. The history is provided by the patient and a relative.  Weakness Primary symptoms do not include headaches, syncope, loss of consciousness, seizures, focal weakness, fever, nausea or vomiting. The symptoms began 1 to 2 hours ago. Episode duration: several minutes. The symptoms are improving. The neurological symptoms are diffuse.  Additional symptoms include weakness. Medical issues also include cerebral vascular accident.  Pt lives with mother as she has significant medical problems Mother went out, came back this afternoon and noted patient was "out of it" and she checked her BP and it was 70, and her glucose >500.   EMS was called who gave patient IV fluids Mother reports pt was less responsive, but no focal weakness reported.  Pt reports she feels fatigue but denies all other complaints No CP/sob/abd pain/fever/HA  Past Medical History  Diagnosis Date  . Hypertension   . Vitamin D deficiency   . Coronary artery disease     NSTEMI 12/11. Cardiac catheterization (12/11) showing LAD totalled in the midportion, diagonal 70% stenosed (small 2-mm vessel), circumflex nonobstructive, OM1 40%, OM2 30%, RCA samll and no significat disease.  LAD occlusion was calcified and appeared chronic at the time of cathetrization so no intervention.  . Ischemic cardiomyopathy     Echo 12/11 with EF 30% and periapical akinesis, no significant MR. RV  looked normal. QRS is not wide on ECG.  Echo (1/12):  EF 40-45%, severe hypokinesis of the anteroseptal wall, apical inferior wall, inferoseptal wall, and apex.  . Orthostatic hypotension     sycopal episode in 12/11 likely due to this.  Probably from combination of depressed cardiac out put and diabetic autonomic  neuropathy.  . Diabetes mellitus     Insulin dependant Type I   . Hyperlipidemia   . Diabetic nephropathy   . Diabetic retinopathy   . Gastroparesis   . Depression   . Systolic heart failure   . Iron deficiency anemia   . Hx MRSA infection   . Osteopenia   . Esophagitis   . Heart attack     Past Surgical History  Procedure Date  . Appendectomy 1980  . Tubal ligation 1987  . Cesarean section 1984  . Refractive surgery     x3    Family History  Problem Relation Age of Onset  . Pancreatic cancer Father   . Colon cancer Neg Hx   . Diabetes Maternal Grandfather     History  Substance Use Topics  . Smoking status: Never Smoker   . Smokeless tobacco: Never Used  . Alcohol Use: No    OB History    Grav Para Term Preterm Abortions TAB SAB Ect Mult Living                  Review of Systems  Constitutional: Negative for fever.  Cardiovascular: Negative for syncope.  Gastrointestinal: Negative for nausea and vomiting.  Neurological: Positive for weakness. Negative for focal weakness, seizures, loss of consciousness and headaches.  All other systems reviewed and are negative.    Allergies  Erythromycin; Food; and Metoclopramide hcl  Home Medications   Current Outpatient Rx  Name Route Sig Dispense Refill  . ALPRAZOLAM 0.5 MG PO TABS Oral  Take 0.5 mg by mouth at bedtime as needed.      . ASPIRIN EC 81 MG PO TBEC Oral Take 1 tablet (81 mg total) by mouth daily.    [name] CALCIUM CARBONATE-VIT D-MIN 600-200 MG-UNIT PO TABS Oral Take 1 tablet by mouth daily.      [name] CARVEDILOL 12.5 MG PO TABS Oral Take 1 tablet (12.5 mg total) by mouth 2 (two) times daily. 60 tablet 6  . CITALOPRAM HYDROBROMIDE 20 MG PO TABS Oral Take 20 mg by mouth daily.      [name] CLOPIDOGREL BISULFATE 75 MG PO TABS Oral Take 75 mg by mouth daily.      [name] DIGOXIN 0.125 MG PO TABS Oral Take 125 mcg by mouth every other day.     [name] DIPHENOXYLATE-ATROPINE 2.5-0.025 MG PO TABS Oral Take 1 tablet by mouth every  other day. 30 tablet 0  . ENALAPRIL MALEATE 5 MG PO TABS  Take 1 and 1/2 tablets twice a day 90 tablet 6  . FERROUS SULFATE 325 (65 FE) MG PO TABS Oral Take 325 mg by mouth daily with breakfast.      . FUROSEMIDE 40 MG PO TABS Oral Take 1 tablet (40 mg total) by mouth daily. 30 tablet 6  . INSULIN ASPART 100 UNIT/ML [place] SOLN Subcutaneous Inject 2-8 Units into the skin 3 (three) times daily before meals. Sliding scale    . INSULIN GLARGINE 100 UNIT/ML [place] SOLN Subcutaneous Inject 10-14 Units into the skin as directed. Patient states 14 units every morning and 10 to 12 units every evening    . QC DAILY MULTIVITAMINS/IRON PO TABS Oral Take 1 tablet by mouth daily.      [name] ROSUVASTATIN CALCIUM 40 MG PO TABS Oral Take 1 tablet (40 mg total) by mouth daily. 30 tablet 11  . TERBINAFINE HCL 1 % EX CREA Topical Apply topically 2 (two) times daily.        BP 134/59  Pulse 63  Temp(Src) 98.1 F (36.7 C) (Oral)  Resp 20  SpO2 100%  LMP 10/11/2010  Physical Exam  CONSTITUTIONAL: Well developed/well nourished HEAD AND FACE: Normocephalic/atraumatic EYES: EOMI/PERRL ENMT: Mucous membranes dry NECK: supple no meningeal signs CV: S1/S2 noted, no murmurs/rubs/gallops noted LUNGS: Lungs are clear to auscultation bilaterally, no apparent distress ABDOMEN: soft, nontender, no rebound or guarding GU:no cva tenderness NEURO: Pt is awake/alert, moves all extremitiesx4 No gross arm/leg drift Face is symmetric EXTREMITIES: pulses normal, full ROM SKIN: warm, color normal.  No wounds noted to lower extremities PSYCH: no abnormalities of mood noted   ED Course  Procedures    Labs Reviewed  POCT CBG MONITORING  BASIC METABOLIC PANEL  CBC  DIFFERENTIAL  URINALYSIS, ROUTINE W REFLEX MICROSCOPIC  I-STAT TROPONIN I  DIGOXIN LEVEL     3:41 PM No hypotension noted here Pt already given IV fluids Stable at this time Will follow closely   7:19 PM Pt improved, ambulatory, taking PO, no  cp/sob/cough reported No abd tenderness She appears well She admits to having low BP recently due to BP meds Doubt sepsis/CAD at this time She wants to go home, continue insulin and close PCP followup for med readjustment  MDM  Nursing notes reviewed and considered in documentation All labs/vitals reviewed and considered Previous records reviewed and considered      Date: 03/17/2011  Rate: 63  Rhythm: normal sinus rhythm  QRS Axis: normal  Intervals: normal  ST/T Wave abnormalities: inverted t wave  Conduction Disutrbances:none  Narrative Interpretation:   Old EKG Reviewed: changes noted       Joya Gaskins, MD 03/17/11 1920

## 2011-03-17 NOTE — ED Notes (Signed)
Pt presents with a c/c of hyperglycemia.  St's she called her doctor because she had a high CBG, they told her to called EMS.  Pt hemodynamically stable although a little bit dry.  EMS started IV in route, infused 400cc of fluids, fluids still infusing.  Complains of no abdominal pain, no AMS, no kussmauls, pt is somewhat fatigued.

## 2011-03-17 NOTE — ED Notes (Signed)
CBG 455 per [facility], did not cross over from glucometer

## 2011-03-17 NOTE — ED Notes (Signed)
ZOX:[id number]<AV> Expected date:03/17/11<BR> Expected time: 1:40 PM<BR> Means of arrival:Ambulance<BR> Comments:<BR> HTN

## 2011-03-17 NOTE — ED Notes (Signed)
[id number]<AV> Expected date:03/17/11<BR> Expected time: 2:52 PM<BR> Means of arrival:Ambulance<BR> Comments:<BR> EMS 11 GC, 87 yof constipation

## 2011-03-17 NOTE — ED Notes (Signed)
Per EMS:  Pt called her doctor because her sugar was 532, was told if it didn't come down to call EMS.  Upon EMS' arrival her CBG was >600, was given 400cc of fluid by EMS.  Pt took lantus and novolog this morning, ate breakfast.

## 2011-03-19 ENCOUNTER — Telehealth: Payer: Self-pay | Admitting: Cardiology

## 2011-03-19 LAB — GLUCOSE, CAPILLARY: Glucose-Capillary: 455 mg/dL — ABNORMAL HIGH (ref 70–99)

## 2011-03-19 NOTE — Telephone Encounter (Signed)
New problem Pt went to hospital Saturday and she wanted to talk to you about it.

## 2011-03-19 NOTE — Telephone Encounter (Signed)
I talked with pt. Pt states her BP was 77/44 on Saturday and today 88/51 today. Pt went to ED on Saturday because of feeling really tired. Pt states her glucose was in the 500s. Pt asking if she should continue enalapril 7.5mg  bid. I will review with Dr Shirlee Latch.

## 2011-03-19 NOTE — Telephone Encounter (Signed)
I reviewed with Dr Shirlee Latch. He recommended pt decrease enalapril to 2.5mg  bid. Pt is aware of these recommendations and verbalized understanding.

## 2011-03-21 ENCOUNTER — Emergency Department (HOSPITAL_COMMUNITY): Payer: Medicaid Other

## 2011-03-21 ENCOUNTER — Other Ambulatory Visit: Payer: Self-pay

## 2011-03-21 ENCOUNTER — Encounter (HOSPITAL_COMMUNITY): Payer: Self-pay | Admitting: *Deleted

## 2011-03-21 ENCOUNTER — Inpatient Hospital Stay (HOSPITAL_COMMUNITY)
Admission: EM | Admit: 2011-03-21 | Discharge: 2011-03-24 | DRG: 690 | Disposition: A | Discharge status: Home / Self Care | Payer: Medicaid Other | Attending: Endocrinology | Admitting: Endocrinology

## 2011-03-21 DIAGNOSIS — I639 Cerebral infarction, unspecified: Secondary | ICD-10-CM

## 2011-03-21 DIAGNOSIS — I509 Heart failure, unspecified: Secondary | ICD-10-CM | POA: Diagnosis present

## 2011-03-21 DIAGNOSIS — I10 Essential (primary) hypertension: Secondary | ICD-10-CM | POA: Diagnosis present

## 2011-03-21 DIAGNOSIS — R799 Abnormal finding of blood chemistry, unspecified: Secondary | ICD-10-CM | POA: Diagnosis present

## 2011-03-21 DIAGNOSIS — E1142 Type 2 diabetes mellitus with diabetic polyneuropathy: Secondary | ICD-10-CM | POA: Diagnosis present

## 2011-03-21 DIAGNOSIS — N390 Urinary tract infection, site not specified: Principal | ICD-10-CM | POA: Diagnosis present

## 2011-03-21 DIAGNOSIS — I2589 Other forms of chronic ischemic heart disease: Secondary | ICD-10-CM | POA: Diagnosis present

## 2011-03-21 DIAGNOSIS — R5381 Other malaise: Secondary | ICD-10-CM | POA: Diagnosis present

## 2011-03-21 DIAGNOSIS — N179 Acute kidney failure, unspecified: Secondary | ICD-10-CM | POA: Diagnosis present

## 2011-03-21 DIAGNOSIS — I951 Orthostatic hypotension: Secondary | ICD-10-CM | POA: Diagnosis present

## 2011-03-21 DIAGNOSIS — I2510 Atherosclerotic heart disease of native coronary artery without angina pectoris: Secondary | ICD-10-CM | POA: Diagnosis present

## 2011-03-21 DIAGNOSIS — R7989 Other specified abnormal findings of blood chemistry: Secondary | ICD-10-CM

## 2011-03-21 DIAGNOSIS — I5022 Chronic systolic (congestive) heart failure: Secondary | ICD-10-CM | POA: Diagnosis present

## 2011-03-21 DIAGNOSIS — I69959 Hemiplegia and hemiparesis following unspecified cerebrovascular disease affecting unspecified side: Secondary | ICD-10-CM

## 2011-03-21 DIAGNOSIS — E785 Hyperlipidemia, unspecified: Secondary | ICD-10-CM | POA: Diagnosis present

## 2011-03-21 DIAGNOSIS — E1039 Type 1 diabetes mellitus with other diabetic ophthalmic complication: Secondary | ICD-10-CM | POA: Diagnosis present

## 2011-03-21 DIAGNOSIS — R531 Weakness: Secondary | ICD-10-CM

## 2011-03-21 DIAGNOSIS — E1049 Type 1 diabetes mellitus with other diabetic neurological complication: Secondary | ICD-10-CM | POA: Diagnosis present

## 2011-03-21 DIAGNOSIS — Z780 Asymptomatic menopausal state: Secondary | ICD-10-CM

## 2011-03-21 DIAGNOSIS — E11319 Type 2 diabetes mellitus with unspecified diabetic retinopathy without macular edema: Secondary | ICD-10-CM | POA: Diagnosis present

## 2011-03-21 DIAGNOSIS — Z794 Long term (current) use of insulin: Secondary | ICD-10-CM

## 2011-03-21 DIAGNOSIS — I252 Old myocardial infarction: Secondary | ICD-10-CM

## 2011-03-21 HISTORY — DX: Cerebral infarction, unspecified: I63.9

## 2011-03-21 LAB — COMPREHENSIVE METABOLIC PANEL
AST: 20 U/L (ref 0–37)
Albumin: 3.7 g/dL (ref 3.5–5.2)
Alkaline Phosphatase: 105 U/L (ref 39–117)
BUN: 37 mg/dL — ABNORMAL HIGH (ref 6–23)
CO2: 29 mEq/L (ref 19–32)
Chloride: 100 mEq/L (ref 96–112)
Creatinine, Ser: 1.67 mg/dL — ABNORMAL HIGH (ref 0.50–1.10)
GFR calc non Af Amer: 34 mL/min — ABNORMAL LOW (ref >90)
Potassium: 4.4 mEq/L (ref 3.5–5.1)
Total Bilirubin: 0.3 mg/dL (ref 0.3–1.2)

## 2011-03-21 LAB — URINALYSIS, ROUTINE W REFLEX MICROSCOPIC
Ketones, ur: 15 mg/dL — AB
Nitrite: POSITIVE — AB
Protein, ur: 30 mg/dL — AB
Urobilinogen, UA: 0.2 mg/dL (ref 0.0–1.0)

## 2011-03-21 LAB — GLUCOSE, CAPILLARY
Glucose-Capillary: 329 mg/dL — ABNORMAL HIGH (ref 70–99)
Glucose-Capillary: 286 mg/dL — ABNORMAL HIGH (ref 70–99)

## 2011-03-21 LAB — DIFFERENTIAL
Basophils Relative: 0 % (ref 0–1)
Lymphocytes Relative: 24 % (ref 12–46)
Lymphs Abs: 2.6 10*3/uL (ref 0.7–4.0)
Monocytes Absolute: 0.9 10*3/uL (ref 0.1–1.0)
Monocytes Relative: 8 % (ref 3–12)
Neutro Abs: 7.3 10*3/uL (ref 1.7–7.7)
Neutrophils Relative %: 67 % (ref 43–77)

## 2011-03-21 LAB — TROPONIN I: Troponin I: 0.7 ng/mL (ref <0.30)

## 2011-03-21 LAB — CK TOTAL AND CKMB (NOT AT ARMC): Total CK: 75 U/L (ref 7–177)

## 2011-03-21 LAB — CBC
HCT: 38 % (ref 36.0–46.0)
Hemoglobin: 12.8 g/dL (ref 12.0–15.0)
MCHC: 33.7 g/dL (ref 30.0–36.0)
RBC: 4.2 MIL/uL (ref 3.87–5.11)
WBC: 10.9 10*3/uL — ABNORMAL HIGH (ref 4.0–10.5)

## 2011-03-21 MED ORDER — INSULIN REGULAR HUMAN 100 UNIT/ML IJ SOLN
5.0000 [IU] | Freq: Once | INTRAMUSCULAR | Status: AC
  Administered 2011-03-21: 5 [IU] via SUBCUTANEOUS
  Filled 2011-03-21: qty 0.05

## 2011-03-21 MED ORDER — SODIUM CHLORIDE 0.9 % IV SOLN
Freq: Once | INTRAVENOUS | Status: AC
  Administered 2011-03-21: 21:00:00 via INTRAVENOUS

## 2011-03-21 NOTE — ED Notes (Signed)
Admitting MD at bedside.

## 2011-03-21 NOTE — ED Notes (Signed)
IV start. Unable to draw labs at this time.

## 2011-03-21 NOTE — ED Notes (Signed)
Woke up this am CBG 101.  Progressively weak thru day.  CBG > 500 at 230pm w/BP 99/44.  PMD [place].  Was seen here past sat for the same.

## 2011-03-21 NOTE — H&P (Signed)
[name] is an 50 y.o. female.   Chief Complaint: Lethargy  HPI: Ms. Name is a pleasant 51 year old female with longstanding type I DM.  She was seen in the office 8 days ago for a routine visit. At that time her sbp was 84 and her a1c was 12.1%.  Her insulin regimen was reinforced and she was referred to our diabetes medication management clinic.  Since then she has been in the E.R. Twice for similar complaints of lethargy.   Her mother is with her and is very involved in her care as [name] is rather simple.  Her sugars have been up and down and her BP has been low and she has just not been herself.  She has been a bit lethargic.  She denies cp, sob, edema, fevers.  She has had no blood from above or below and her p.o. Intake has been okay.  She had a stroke in 5/12 that affected her right UE and Right LE and when she is not feeling well the right hand weakness is worse and that has been noted.  In the ER the ECG shows no new acute changes (although it is not a normal ecg) and she has a slight bump in creatinine and a slight bump in her cardiac enzyme.  She may have evidence of a uti.  Given all of these findings we are asked to admit her.  Past Medical History  Diagnosis Date  . Hypertension   . Vitamin D deficiency   . Coronary artery disease     NSTEMI 12/11. Cardiac catheterization (12/11) showing LAD totalled in the midportion, diagonal 70% stenosed (small 2-mm vessel), circumflex nonobstructive, OM1 40%, OM2 30%, RCA samll and no significat disease.  LAD occlusion was calcified and appeared chronic at the time of cathetrization so no intervention.  . Ischemic cardiomyopathy     Echo 12/11 with EF 30% and periapical akinesis, no significant MR. RV  looked normal. QRS is not wide on ECG.  Echo (1/12):  EF 40-45%, severe hypokinesis of the anteroseptal wall, apical inferior wall, inferoseptal wall, and apex.  . Orthostatic hypotension     sycopal episode in 12/11 likely due to this.  Probably from  combination of depressed cardiac out put and diabetic autonomic neuropathy.  . Diabetes mellitus     Insulin dependant Type I  Since 1969  . Hyperlipidemia   . Diabetic nephropathy   . Diabetic retinopathy   . Gastroparesis   . Depression   . Systolic heart failure   . Iron deficiency anemia   . Hx MRSA infection with periorbital cellulitis 2012   . Osteopenia   . Esophagitis   . Heart attack   . Stroke May 2012    weakness right side - right hand and right arm the worst    Past Surgical History  Procedure Date  . Appendectomy 1980  . Tubal ligation 1987  . Cesarean section 1984  . Refractive surgery     x3    Family History  Problem Relation Age of Onset  . Pancreatic cancer Father Died age 71  . Colon cancer Neg Hx   . Diabetes Maternal Grandfather    Mother is alive and well Social History:  reports that she has never smoked. She has never used smokeless tobacco. She reports that she does not drink alcohol or use illicit drugs.  She is divorced and has one child and one Grandchild  Allergies:  Allergies  Allergen Reactions  .  Erythromycin     REACTION: GI upset  . Food Nausea Only    Green peas  . Metoclopramide Hcl     REACTION: fatique, depression    Medications Prior to Admission  Medication Dose Route Frequency Provider Last Rate Last Dose  . 0.9 %  sodium chloride infusion   Intravenous Once Geoffery Lyons, MD 1,000 mL/hr at 03/21/11 2033    . insulin regular (HUMULIN R,NOVOLIN R) 100 units/mL injection 5 Units  5 Units Subcutaneous Once Ezequiel Kayser, MD       Medications Prior to Admission  Medication Sig Dispense Refill  . ALPRAZolam (XANAX) 0.5 MG tablet Take 0.5 mg by mouth daily as needed.       [name] aspirin EC 81 MG tablet Take 81 mg by mouth at bedtime.       . Calcium Carbonate-Vit D-Min 600-200 MG-UNIT TABS Take 1 tablet by mouth daily.        . carvedilol (COREG) 12.5 MG tablet Take 1 tablet (12.5 mg total) by mouth 2 (two) times daily.  60  tablet  6  . citalopram (CELEXA) 20 MG tablet Take 20 mg by mouth daily.        . clopidogrel (PLAVIX) 75 MG tablet Take 75 mg by mouth daily.        . digoxin (LANOXIN) 0.125 MG tablet Take 125 mcg by mouth every other day.       . enalapril (VASOTEC) 2.5 MG tablet 1/2 of a 5mg  tablet bid  30 tablet  11  . furosemide (LASIX) 40 MG tablet Take 1 tablet (40 mg total) by mouth daily.  30 tablet  6  . insulin aspart (NOVOLOG) 100 UNIT/ML injection Inject 2-8 Units into the skin 3 (three) times daily before meals. Sliding scale      . insulin glargine (LANTUS) 100 UNIT/ML injection Inject 10-14 Units into the skin as directed. Patient states 14 units every morning and 10 to 12 units every evening      . Multiple Vitamins-Iron (QC DAILY MULTIVITAMINS/IRON) TABS Take 1 tablet by mouth daily.        . rosuvastatin (CRESTOR) 40 MG tablet Take 40 mg by mouth at bedtime as needed.   30 tablet  11  . diphenoxylate-atropine (LOMOTIL) 2.5-0.025 MG per tablet Take 1 tablet by mouth every other day.  30 tablet  0  . ferrous sulfate 325 (65 FE) MG tablet Take 325 mg by mouth daily with breakfast.        . terbinafine (LAMISIL AT) 1 % cream Apply topically 2 (two) times daily.        [name] DISCONTD: enalapril (VASOTEC) 5 MG tablet Take 1 tablet (5 mg total) by mouth daily.  60 tablet  6    Results for orders placed during the hospital encounter of 03/21/11 (from the past 48 hour(s))  GLUCOSE, CAPILLARY     Status: Abnormal   Collection Time   03/21/11  4:45 PM      Component Value Range Comment   Glucose-Capillary 329 (*) 70 - 99 (mg/dL)   CBC     Status: Abnormal   Collection Time   03/21/11  6:25 PM      Component Value Range Comment   WBC 10.9 (*) 4.0 - 10.5 (K/uL)    RBC 4.20  3.87 - 5.11 (MIL/uL)    Hemoglobin 12.8  12.0 - 15.0 (g/dL)    HCT 16.1  09.6 - 04.5 (%)    MCV 90.5  78.0 - 100.0 (fL)    MCH 30.5  26.0 - 34.0 (pg)    MCHC 33.7  30.0 - 36.0 (g/dL)    RDW 16.1  09.6 - 04.5 (%)    Platelets  281  150 - 400 (K/uL)   DIFFERENTIAL     Status: Normal   Collection Time   03/21/11  6:25 PM      Component Value Range Comment   Neutrophils Relative 67  43 - 77 (%)    Neutro Abs 7.3  1.7 - 7.7 (K/uL)    Lymphocytes Relative 24  12 - 46 (%)    Lymphs Abs 2.6  0.7 - 4.0 (K/uL)    Monocytes Relative 8  3 - 12 (%)    Monocytes Absolute 0.9  0.1 - 1.0 (K/uL)    Eosinophils Relative 1  0 - 5 (%)    Eosinophils Absolute 0.1  0.0 - 0.7 (K/uL)    Basophils Relative 0  0 - 1 (%)    Basophils Absolute 0.0  0.0 - 0.1 (K/uL)   COMPREHENSIVE METABOLIC PANEL     Status: Abnormal   Collection Time   03/21/11  6:25 PM      Component Value Range Comment   Sodium 139  135 - 145 (mEq/L)    Potassium 4.4  3.5 - 5.1 (mEq/L)    Chloride 100  96 - 112 (mEq/L)    CO2 29  19 - 32 (mEq/L)    Glucose, Bld 209 (*) 70 - 99 (mg/dL)    BUN 37 (*) 6 - 23 (mg/dL)    Creatinine, Ser 4.09 (*) 0.50 - 1.10 (mg/dL)    Calcium 81.1  8.4 - 10.5 (mg/dL)    Total Protein 7.9  6.0 - 8.3 (g/dL)    Albumin 3.7  3.5 - 5.2 (g/dL)    AST 20  0 - 37 (U/L)    ALT 20  0 - 35 (U/L)    Alkaline Phosphatase 105  39 - 117 (U/L)    Total Bilirubin 0.3  0.3 - 1.2 (mg/dL)    GFR calc non Af Amer 34 (*) >90 (mL/min)    GFR calc Af Amer 40 (*) >90 (mL/min)   CK TOTAL AND CKMB     Status: Abnormal   Collection Time   03/21/11  6:25 PM      Component Value Range Comment   Total CK 75  7 - 177 (U/L)    CK, MB 4.2 (*) 0.3 - 4.0 (ng/mL)    Relative Index RELATIVE INDEX IS INVALID  0.0 - 2.5    TROPONIN I     Status: Abnormal   Collection Time   03/21/11  6:25 PM      Component Value Range Comment   Troponin I 0.68 (*) <0.30 (ng/mL)   DIGOXIN LEVEL     Status: Normal   Collection Time   03/21/11  6:25 PM      Component Value Range Comment   Digoxin Level 0.9  0.8 - 2.0 (ng/mL)   URINALYSIS, ROUTINE W REFLEX MICROSCOPIC     Status: Abnormal   Collection Time   03/21/11  7:37 PM      Component Value Range Comment   Color,  Urine RED (*) YELLOW  BIOCHEMICALS MAY BE AFFECTED BY COLOR   APPearance TURBID (*) CLEAR     Specific Gravity, Urine 1.021  1.005 - 1.030     pH 5.0  5.0 - 8.0  Glucose, UA >1000 (*) NEGATIVE (mg/dL)    Hgb urine dipstick LARGE (*) NEGATIVE     Bilirubin Urine NEGATIVE  NEGATIVE     Ketones, ur 15 (*) NEGATIVE (mg/dL)    Protein, ur 30 (*) NEGATIVE (mg/dL)    Urobilinogen, UA 0.2  0.0 - 1.0 (mg/dL)    Nitrite POSITIVE (*) NEGATIVE     Leukocytes, UA SMALL (*) NEGATIVE    URINE MICROSCOPIC-ADD ON     Status: Abnormal   Collection Time   03/21/11  7:37 PM      Component Value Range Comment   Squamous Epithelial / LPF FEW (*) RARE     Urine-Other FIELD OBSCURED BY RBC'S     CK TOTAL AND CKMB     Status: Abnormal   Collection Time   03/21/11  8:44 PM      Component Value Range Comment   Total CK 68  7 - 177 (U/L)    CK, MB 4.4 (*) 0.3 - 4.0 (ng/mL)    Relative Index RELATIVE INDEX IS INVALID  0.0 - 2.5    TROPONIN I     Status: Abnormal   Collection Time   03/21/11  8:44 PM      Component Value Range Comment   Troponin I 0.70 (*) <0.30 (ng/mL)   GLUCOSE, CAPILLARY     Status: Abnormal   Collection Time   03/21/11  9:30 PM      Component Value Range Comment   Glucose-Capillary 286 (*) 70 - 99 (mg/dL)    Comment 1 Notify RN      Ct Head Wo Contrast  03/21/2011  *RADIOLOGY REPORT*  Clinical Data: Hyperglycemia, weakness and hypotension.  History of stroke.  CT HEAD WITHOUT CONTRAST  Technique:  Contiguous axial images were obtained from the base of the skull through the vertex without contrast.  Comparison: Head CT 06/26/2010.  MRI of the brain 08/28/2010.  Findings: There is subtle low density in the periventricular white matter on image 21, corresponding with the small infarct demonstrated on the prior MRI. There is no evidence of acute intracranial hemorrhage, mass lesion, brain edema or extra-axial fluid collection.  The ventricles and subarachnoid spaces are appropriately  sized for age.  There is no CT evidence of acute cortical infarction.  There are prominent calcifications of the left vertebral and internal carotid arteries.  The visualized paranasal sinuses are clear. The calvarium is intact.  IMPRESSION: No acute intracranial findings.  Original Report Authenticated By: Gerrianne Scale, M.D.    ROS:as per the hpi  Blood pressure 110/54, pulse 63, temperature 98.5 F (36.9 C), temperature source Oral, resp. rate 12, last menstrual period 03/13/2011, SpO2 100.00%. She is lying semi-supine in NAD, no pallor or icterus. She is alert and oriented times 4. No jvd Lungs CTA bilaterally with no w/r/r Heart is rrr with no m/r/g Abd is soft, nt, nd, no mass  Or hsm with nabs She moves extremities times 4 with grossly normal strength. No edema noted. Speech is fluent.    Assessment/Plan Patient Active Problem List  Diagnoses  . HYPERLIPIDEMIA follow.  [name] ANEMIA-IRON DEFICIENCY-no sign of this now  . HYPERTENSION- she is hypotensive a bit and has an ischemic cardiomyopathy.  Continue home regimen as tolerated.  [name] DIABETES MELLITUS, TYPE I-continue insulin regimen  . DIABETIC  RETINOPATHY-follow  . CORONARY ATHEROSCLEROSIS NATIVE CORONARY ARTERY-cardiology has been consulted.  It is unclear if any of this represents acute or subacute cardiac ischemia but we will  see what their opinion may be.  . ISCHEMIC CARDIOMYOPATHY-per cardiology  . CHRONIC SYSTOLIC HEART FAILURE-continue meds as tolerated  . ORTHOSTATIC HYPOTENSION-follow  . FATIGUE-check b12 level-  . PERIMENOPAUSAL STATUS-she is on her period now for the first time since march 2012. Follow.  . CVA (cerebral infarction)-continue medicines for risk factor reduction  . UTI-send urine culture and start cipro empirically. Full Code status.      Ezequiel Kayser, MD 03/21/2011, 9:56 PM

## 2011-03-21 NOTE — ED Notes (Signed)
Patient transported to CT 

## 2011-03-21 NOTE — ED Provider Notes (Addendum)
History     CSN: [id number] Arrival date & time: 03/21/2011  3:54 PM   First MD Initiated Contact with Patient 03/21/11 1731      Chief Complaint  Patient presents with  . Hyperglycemia  . Hypotension  . Weakness    (Consider location/radiation/quality/duration/timing/severity/associated sxs/prior treatment) HPI Comments: Blood sugars are high, took insulin at home.  Has history of CAD, cardiomyopathy, MI, DM, CVA.    Patient is a 50 y.o. female presenting with weakness. The history is provided by the patient.  Weakness The primary symptoms include nausea. Primary symptoms do not include headaches, syncope, dizziness, fever or vomiting. The symptoms began 6 to 12 hours ago. The symptoms are worsening. The neurological symptoms are diffuse.  Additional symptoms include weakness.    Past Medical History  Diagnosis Date  . Hypertension   . Vitamin D deficiency   . Coronary artery disease     NSTEMI 12/11. Cardiac catheterization (12/11) showing LAD totalled in the midportion, diagonal 70% stenosed (small 2-mm vessel), circumflex nonobstructive, OM1 40%, OM2 30%, RCA samll and no significat disease.  LAD occlusion was calcified and appeared chronic at the time of cathetrization so no intervention.  . Ischemic cardiomyopathy     Echo 12/11 with EF 30% and periapical akinesis, no significant MR. RV  looked normal. QRS is not wide on ECG.  Echo (1/12):  EF 40-45%, severe hypokinesis of the anteroseptal wall, apical inferior wall, inferoseptal wall, and apex.  . Orthostatic hypotension     sycopal episode in 12/11 likely due to this.  Probably from combination of depressed cardiac out put and diabetic autonomic neuropathy.  . Diabetes mellitus     Insulin dependant Type I   . Hyperlipidemia   . Diabetic nephropathy   . Diabetic retinopathy   . Gastroparesis   . Depression   . Systolic heart failure   . Iron deficiency anemia   . Hx MRSA infection   . Osteopenia   . Esophagitis     . Heart attack     Past Surgical History  Procedure Date  . Appendectomy 1980  . Tubal ligation 1987  . Cesarean section 1984  . Refractive surgery     x3    Family History  Problem Relation Age of Onset  . Pancreatic cancer Father   . Colon cancer Neg Hx   . Diabetes Maternal Grandfather     History  Substance Use Topics  . Smoking status: Never Smoker   . Smokeless tobacco: Never Used  . Alcohol Use: No    OB History    Grav Para Term Preterm Abortions TAB SAB Ect Mult Living                  Review of Systems  Constitutional: Positive for appetite change and fatigue. Negative for fever, chills and diaphoresis.  Cardiovascular: Negative for syncope.  Gastrointestinal: Positive for nausea. Negative for vomiting.  Neurological: Positive for weakness. Negative for dizziness and headaches.  All other systems reviewed and are negative.    Allergies  Erythromycin; Food; and Metoclopramide hcl  Home Medications   Current Outpatient Rx  Name Route Sig Dispense Refill  . ALPRAZOLAM 0.5 MG PO TABS Oral Take 0.5 mg by mouth daily as needed.     . ASPIRIN EC 81 MG PO TBEC Oral Take 81 mg by mouth at bedtime.     [name] CALCIUM CARBONATE-VIT D-MIN 600-200 MG-UNIT PO TABS Oral Take 1 tablet by mouth daily.      [name]  CARVEDILOL 12.5 MG PO TABS Oral Take 1 tablet (12.5 mg total) by mouth 2 (two) times daily. 60 tablet 6  . CITALOPRAM HYDROBROMIDE 20 MG PO TABS Oral Take 20 mg by mouth daily.      [name] CLOPIDOGREL BISULFATE 75 MG PO TABS Oral Take 75 mg by mouth daily.      [name] DIGOXIN 0.125 MG PO TABS Oral Take 125 mcg by mouth every other day.     . ENALAPRIL MALEATE 2.5 MG PO TABS  1/2 of a 5mg  tablet bid 30 tablet 11  . FUROSEMIDE 40 MG PO TABS Oral Take 1 tablet (40 mg total) by mouth daily. 30 tablet 6  . INSULIN ASPART 100 UNIT/ML [place] SOLN Subcutaneous Inject 2-8 Units into the skin 3 (three) times daily before meals. Sliding scale    . INSULIN GLARGINE 100 UNIT/ML [place] SOLN  Subcutaneous Inject 10-14 Units into the skin as directed. Patient states 14 units every morning and 10 to 12 units every evening    . QC DAILY MULTIVITAMINS/IRON PO TABS Oral Take 1 tablet by mouth daily.      [name] ROSUVASTATIN CALCIUM 40 MG PO TABS Oral Take 40 mg by mouth at bedtime as needed.  30 tablet 11  . SPIRONOLACTONE 25 MG PO TABS Oral Take 25 mg by mouth daily.      [name] DIPHENOXYLATE-ATROPINE 2.5-0.025 MG PO TABS Oral Take 1 tablet by mouth every other day. 30 tablet 0  . FERROUS SULFATE 325 (65 FE) MG PO TABS Oral Take 325 mg by mouth daily with breakfast.      . TERBINAFINE HCL 1 % EX CREA Topical Apply topically 2 (two) times daily.        BP 90/48  Pulse 70  Temp(Src) 98.4 F (36.9 C) (Oral)  Resp 16  SpO2 100%  LMP 10/11/2010  Physical Exam  Constitutional: She is oriented to person, place, and time. She appears well-developed.       Appears somnolent, but appropriate  HENT:  Head: Normocephalic and atraumatic.  Eyes: EOM are normal. Pupils are equal, round, and reactive to light.  Neck: Normal range of motion. Neck supple.  Cardiovascular: Normal rate, regular rhythm and normal heart sounds.  Exam reveals no gallop and no friction rub.   No murmur heard. Pulmonary/Chest: Effort normal and breath sounds normal. No respiratory distress. She has no wheezes. She has no rales.  Abdominal: Soft. Bowel sounds are normal. She exhibits no distension. There is no tenderness.  Musculoskeletal: Normal range of motion. She exhibits no edema.  Neurological: She is alert and oriented to person, place, and time. No cranial nerve deficit. Coordination normal.  Skin: Skin is warm and dry.    ED Course  Procedures (including critical care time)  Labs Reviewed  GLUCOSE, CAPILLARY - Abnormal; Notable for the following:    Glucose-Capillary 329 (*)    All other components within normal limits  POCT CBG MONITORING   No results found.   No diagnosis found.   Date: 03/21/2011   Rate: 67  Rhythm: normal sinus rhythm  QRS Axis: left  Intervals: normal  ST/T Wave abnormalities: normal  Conduction Disutrbances:none  Narrative Interpretation:   Old EKG Reviewed: none available    MDM  The patient is complaining only of weakness.  The ekg looks okay, the troponin is now mildly elevated when it was negative four days ago.  Her renal function has worsened, however.  Will call cardiology who recommends [facility] for admission.  Geoffery Lyons, MD 03/21/11 1610  Geoffery Lyons, MD 03/21/11 2050

## 2011-03-21 NOTE — ED Notes (Signed)
MD at bedside. 

## 2011-03-21 NOTE — ED Notes (Signed)
Receiving RN on 4E unable to receive report at this time. Will call back when available.

## 2011-03-22 ENCOUNTER — Other Ambulatory Visit: Payer: Self-pay

## 2011-03-22 ENCOUNTER — Encounter (HOSPITAL_COMMUNITY): Payer: Self-pay | Admitting: *Deleted

## 2011-03-22 LAB — BASIC METABOLIC PANEL
CO2: 26 mEq/L (ref 19–32)
Calcium: 9.5 mg/dL (ref 8.4–10.5)
Chloride: 102 mEq/L (ref 96–112)
Glucose, Bld: 346 mg/dL — ABNORMAL HIGH (ref 70–99)
Sodium: 138 mEq/L (ref 135–145)

## 2011-03-22 LAB — DIFFERENTIAL
Eosinophils Absolute: 0.6 10*3/uL (ref 0.0–0.7)
Eosinophils Relative: 6 % — ABNORMAL HIGH (ref 0–5)
Lymphocytes Relative: 26 % (ref 12–46)
Lymphs Abs: 2.7 10*3/uL (ref 0.7–4.0)
Monocytes Absolute: 0.5 10*3/uL (ref 0.1–1.0)
Monocytes Relative: 5 % (ref 3–12)

## 2011-03-22 LAB — COMPREHENSIVE METABOLIC PANEL
AST: 15 U/L (ref 0–37)
Albumin: 3.2 g/dL — ABNORMAL LOW (ref 3.5–5.2)
BUN: 39 mg/dL — ABNORMAL HIGH (ref 6–23)
Chloride: 97 mEq/L (ref 96–112)
Creatinine, Ser: 1.61 mg/dL — ABNORMAL HIGH (ref 0.50–1.10)
Total Bilirubin: 0.3 mg/dL (ref 0.3–1.2)
Total Protein: 6.9 g/dL (ref 6.0–8.3)

## 2011-03-22 LAB — MRSA PCR SCREENING: MRSA by PCR: POSITIVE — AB

## 2011-03-22 LAB — GLUCOSE, CAPILLARY
Glucose-Capillary: 520 mg/dL — ABNORMAL HIGH (ref 70–99)
Glucose-Capillary: 494 mg/dL — ABNORMAL HIGH (ref 70–99)
Glucose-Capillary: 245 mg/dL — ABNORMAL HIGH (ref 70–99)

## 2011-03-22 LAB — HEMOGLOBIN A1C: Hgb A1c MFr Bld: 12.2 % — ABNORMAL HIGH (ref <5.7)

## 2011-03-22 LAB — CBC
HCT: 35.8 % — ABNORMAL LOW (ref 36.0–46.0)
Hemoglobin: 11.9 g/dL — ABNORMAL LOW (ref 12.0–15.0)
MCH: 30.8 pg (ref 26.0–34.0)
MCV: 92.7 fL (ref 78.0–100.0)
RBC: 3.86 MIL/uL — ABNORMAL LOW (ref 3.87–5.11)
WBC: 10.7 10*3/uL — ABNORMAL HIGH (ref 4.0–10.5)

## 2011-03-22 LAB — TSH: TSH: 3.924 u[IU]/mL (ref 0.350–4.500)

## 2011-03-22 MED ORDER — CIPROFLOXACIN HCL 250 MG PO TABS
250.0000 mg | ORAL_TABLET | Freq: Two times a day (BID) | ORAL | Status: DC
  Administered 2011-03-22 – 2011-03-24 (×6): 250 mg via ORAL
  Filled 2011-03-22 (×7): qty 1

## 2011-03-22 MED ORDER — ENALAPRIL MALEATE 2.5 MG PO TABS
2.5000 mg | ORAL_TABLET | Freq: Two times a day (BID) | ORAL | Status: DC
Start: 2011-03-22 — End: 2011-03-24
  Administered 2011-03-22 – 2011-03-24 (×6): 2.5 mg via ORAL
  Filled 2011-03-22 (×7): qty 1

## 2011-03-22 MED ORDER — ACETAMINOPHEN 325 MG PO TABS: 650.0000 mg | ORAL_TABLET | Freq: Four times a day (QID) | ORAL | Status: DC | PRN

## 2011-03-22 MED ORDER — SPIRONOLACTONE 25 MG PO TABS: 25.0000 mg | ORAL_TABLET | Freq: Every day | ORAL | Status: DC

## 2011-03-22 MED ORDER — CHLORHEXIDINE GLUCONATE CLOTH 2 % EX PADS
6.0000 | MEDICATED_PAD | Freq: Every day | CUTANEOUS | Status: DC
  Administered 2011-03-22 – 2011-03-24 (×3): 6 via TOPICAL

## 2011-03-22 MED ORDER — CARVEDILOL 6.25 MG PO TABS
6.2500 mg | ORAL_TABLET | Freq: Two times a day (BID) | ORAL | Status: DC
  Administered 2011-03-22 – 2011-03-24 (×5): 6.25 mg via ORAL
  Filled 2011-03-22 (×6): qty 1

## 2011-03-22 MED ORDER — CITALOPRAM HYDROBROMIDE 20 MG PO TABS
20.0000 mg | ORAL_TABLET | Freq: Every day | ORAL | Status: DC
  Administered 2011-03-22 – 2011-03-24 (×3): 20 mg via ORAL
  Filled 2011-03-22 (×3): qty 1

## 2011-03-22 MED ORDER — ACETAMINOPHEN 650 MG RE SUPP: 650.0000 mg | Freq: Four times a day (QID) | RECTAL | Status: DC | PRN

## 2011-03-22 MED ORDER — SODIUM CHLORIDE 0.9 % IJ SOLN
3.0000 mL | Freq: Two times a day (BID) | INTRAMUSCULAR | Status: DC
  Administered 2011-03-22 – 2011-03-23 (×5): 3 mL via INTRAVENOUS

## 2011-03-22 MED ORDER — INSULIN ASPART 100 UNIT/ML ~~LOC~~ SOLN
0.0000 [IU] | Freq: Every day | SUBCUTANEOUS | Status: DC
  Administered 2011-03-22: 3 [IU] via SUBCUTANEOUS

## 2011-03-22 MED ORDER — SPIRONOLACTONE 12.5 MG HALF TABLET
12.5000 mg | ORAL_TABLET | Freq: Every day | ORAL | Status: DC
  Administered 2011-03-22 – 2011-03-24 (×3): 12.5 mg via ORAL
  Filled 2011-03-22 (×3): qty 1

## 2011-03-22 MED ORDER — DIGOXIN 125 MCG PO TABS
125.0000 ug | ORAL_TABLET | ORAL | Status: DC
  Administered 2011-03-22 – 2011-03-24 (×2): 125 ug via ORAL
  Filled 2011-03-22 (×2): qty 1

## 2011-03-22 MED ORDER — ROSUVASTATIN CALCIUM 40 MG PO TABS
40.0000 mg | ORAL_TABLET | Freq: Every day | ORAL | Status: DC
  Administered 2011-03-22 – 2011-03-24 (×3): 40 mg via ORAL
  Filled 2011-03-22 (×3): qty 1

## 2011-03-22 MED ORDER — ENOXAPARIN SODIUM 40 MG/0.4ML ~~LOC~~ SOLN
40.0000 mg | SUBCUTANEOUS | Status: DC
  Administered 2011-03-22 – 2011-03-24 (×3): 40 mg via SUBCUTANEOUS
  Filled 2011-03-22 (×3): qty 0.4

## 2011-03-22 MED ORDER — FLORA-Q PO CAPS
1.0000 | ORAL_CAPSULE | Freq: Every day | ORAL | Status: DC
  Administered 2011-03-22 – 2011-03-23 (×2): 1 via ORAL
  Filled 2011-03-22 (×3): qty 1

## 2011-03-22 MED ORDER — FUROSEMIDE 40 MG PO TABS
40.0000 mg | ORAL_TABLET | Freq: Every day | ORAL | Status: DC
  Filled 2011-03-22: qty 1

## 2011-03-22 MED ORDER — INSULIN ASPART 100 UNIT/ML ~~LOC~~ SOLN
9.0000 [IU] | Freq: Once | SUBCUTANEOUS | Status: AC
  Administered 2011-03-22: 9 [IU] via SUBCUTANEOUS

## 2011-03-22 MED ORDER — INSULIN ASPART 100 UNIT/ML ~~LOC~~ SOLN
0.0000 [IU] | Freq: Three times a day (TID) | SUBCUTANEOUS | Status: DC
  Administered 2011-03-22: 7 [IU] via SUBCUTANEOUS
  Administered 2011-03-22: 9 [IU] via SUBCUTANEOUS
  Administered 2011-03-22: 5 [IU] via SUBCUTANEOUS
  Administered 2011-03-23: 2 [IU] via SUBCUTANEOUS
  Administered 2011-03-23: 3 [IU] via SUBCUTANEOUS
  Administered 2011-03-24: 9 [IU] via SUBCUTANEOUS
  Filled 2011-03-22: qty 3

## 2011-03-22 MED ORDER — INSULIN GLARGINE 100 UNIT/ML ~~LOC~~ SOLN
10.0000 [IU] | SUBCUTANEOUS | Status: DC
  Administered 2011-03-22 – 2011-03-23 (×2): 10 [IU] via SUBCUTANEOUS
  Filled 2011-03-22: qty 3

## 2011-03-22 MED ORDER — THERA M PLUS PO TABS
1.0000 | ORAL_TABLET | Freq: Every day | ORAL | Status: DC
  Administered 2011-03-22 – 2011-03-23 (×2): 1 via ORAL
  Administered 2011-03-24: 11:00:00 via ORAL
  Filled 2011-03-22 (×3): qty 1

## 2011-03-22 MED ORDER — CARVEDILOL 12.5 MG PO TABS
12.5000 mg | ORAL_TABLET | Freq: Two times a day (BID) | ORAL | Status: DC
  Administered 2011-03-22: 12.5 mg via ORAL
  Filled 2011-03-22: qty 1

## 2011-03-22 MED ORDER — INSULIN GLARGINE 100 UNIT/ML ~~LOC~~ SOLN: 10.0000 [IU] | Freq: Two times a day (BID) | SUBCUTANEOUS | Status: DC

## 2011-03-22 MED ORDER — MUPIROCIN 2 % EX OINT
1.0000 | TOPICAL_OINTMENT | Freq: Two times a day (BID) | CUTANEOUS | Status: DC
Start: 2011-03-22 — End: 2011-03-24
  Administered 2011-03-22 – 2011-03-23 (×5): 1 via NASAL
  Filled 2011-03-22: qty 22

## 2011-03-22 MED ORDER — ASPIRIN EC 81 MG PO TBEC
81.0000 mg | DELAYED_RELEASE_TABLET | Freq: Every day | ORAL | Status: DC
Start: 2011-03-22 — End: 2011-03-24
  Administered 2011-03-22 – 2011-03-23 (×2): 81 mg via ORAL
  Filled 2011-03-22 (×3): qty 1

## 2011-03-22 MED ORDER — FERROUS SULFATE 325 (65 FE) MG PO TABS
325.0000 mg | ORAL_TABLET | Freq: Every day | ORAL | Status: DC
  Administered 2011-03-22 – 2011-03-24 (×3): 325 mg via ORAL
  Filled 2011-03-22 (×3): qty 1

## 2011-03-22 MED ORDER — ALPRAZOLAM 0.5 MG PO TABS: 0.5000 mg | ORAL_TABLET | Freq: Every day | ORAL | Status: DC | PRN

## 2011-03-22 MED ORDER — FUROSEMIDE 20 MG PO TABS
20.0000 mg | ORAL_TABLET | Freq: Every day | ORAL | Status: DC
  Administered 2011-03-22 – 2011-03-23 (×2): 20 mg via ORAL
  Filled 2011-03-22 (×3): qty 1

## 2011-03-22 MED ORDER — INSULIN ASPART 100 UNIT/ML ~~LOC~~ SOLN
3.0000 [IU] | Freq: Three times a day (TID) | SUBCUTANEOUS | Status: DC
  Administered 2011-03-22 – 2011-03-24 (×6): 3 [IU] via SUBCUTANEOUS

## 2011-03-22 MED ORDER — CLOPIDOGREL BISULFATE 75 MG PO TABS
75.0000 mg | ORAL_TABLET | Freq: Every day | ORAL | Status: DC
  Administered 2011-03-22 – 2011-03-24 (×3): 75 mg via ORAL
  Filled 2011-03-22 (×3): qty 1

## 2011-03-22 NOTE — Progress Notes (Addendum)
Subjective: She's feeling much better at the present time is having no nausea abdominal pain. She is ready to eat breakfast. She feels less weak. She notes no chest pain or breathing trouble. Her blood sugars were up significantly during the night to 586. She did receive 9 units of Humalog is now down to 317. Just a few minutes ago she received. 10 units of Lantus +7 of Humalog will have her recheck in a couple of hours. She's been seen already by the cardiology group and feels like her medications may have been a bit too strong adjustments have been made. She has no urinary complaints at the present time.  Objective: Vital signs in last 24 hours: Temp:  [98.1 F (36.7 C)-98.8 F (37.1 C)] 98.4 F (36.9 C) (11/29 0600) Pulse Rate:  [63-74] 74  (11/29 0600) Resp:  [12-18] 16  (11/29 0600) BP: (90-122)/(48-67) 114/62 mmHg (11/29 0600) SpO2:  [96 %-100 %] 96 % (11/29 0600) Weight:  [72.3 kg (159 lb 6.3 oz)] 159 lb 6.3 oz (72.3 kg) (11/29 0600)  Intake/Output from previous day: 11/28 0701 - 11/29 0700 In: -  Out: 350 [Urine:350] Intake/Output this shift:    General: alert she is slightly pale but in no distress. Sclera anicteric. Orderlies members are moist with no lesions. Neck is supple without bruits. Lungs are clear without wheezes rales or rhonchi no accessory muscles are in use. Heart is slightly Fast and  regular with a systolic murmur. Abdomen is soft and nontender no masses. Extremities reveal trace of edema. The patient's awake alert and mentating perfectly well speech is clear no tremor is present. Lab Results   [facility] 03/22/11 0133 03/21/11 1825  WBC 10.7* 10.9*  RBC 3.86* 4.20  HGB 11.9* 12.8  HCT 35.8* 38.0  MCV 92.7 90.5  MCH 30.8 30.5  RDW 13.8 13.4  PLT 249 281    Basename 03/22/11 0133 03/21/11 1825  NA 134* 139  K 4.6 4.4  CL 97 100  CO2 20 29  GLUCOSE 586* 209*  BUN 39* 37*  CREATININE 1.61* 1.67*  CALCIUM 9.0 10.3    Studies/Results: Ct Head Wo  Contrast  03/21/2011  *RADIOLOGY REPORT*  Clinical Data: Hyperglycemia, weakness and hypotension.  History of stroke.  CT HEAD WITHOUT CONTRAST  Technique:  Contiguous axial images were obtained from the base of the skull through the vertex without contrast.  Comparison: Head CT 06/26/2010.  MRI of the brain 08/28/2010.  Findings: There is subtle low density in the periventricular white matter on image 21, corresponding with the small infarct demonstrated on the prior MRI. There is no evidence of acute intracranial hemorrhage, mass lesion, brain edema or extra-axial fluid collection.  The ventricles and subarachnoid spaces are appropriately sized for age.  There is no CT evidence of acute cortical infarction.  There are prominent calcifications of the left vertebral and internal carotid arteries.  The visualized paranasal sinuses are clear. The calvarium is intact.  IMPRESSION: No acute intracranial findings.  Original Report Authenticated By: Gerrianne Scale, M.D.    Scheduled Meds:   . sodium chloride   Intravenous Once  . aspirin EC  81 mg Oral QHS  . carvedilol  6.25 mg Oral BID  . Chlorhexidine Gluconate Cloth  6 each Topical Q0600  . ciprofloxacin  250 mg Oral BID  . citalopram  20 mg Oral Daily  . clopidogrel  75 mg Oral Daily  . digoxin  125 mcg Oral QODAY  . enalapril  2.5 mg  Oral BID  . enoxaparin  40 mg Subcutaneous Q24H  . ferrous sulfate  325 mg Oral Q breakfast  . Flora-Q  1 capsule Oral Daily  . furosemide  20 mg Oral Daily  . insulin aspart  0-5 Units Subcutaneous QHS  . insulin aspart  0-9 Units Subcutaneous TID WC  . insulin aspart  3 Units Subcutaneous TID WC  . insulin aspart  9 Units Subcutaneous Once  . insulin glargine  10 Units Subcutaneous Q0700  . insulin regular  5 Units Subcutaneous Once  . multivitamins ther. w/minerals  1 tablet Oral Daily  . mupirocin ointment  1 application Nasal BID  . rosuvastatin  40 mg Oral Daily  . sodium chloride  3 mL Intravenous  Q12H  . spironolactone  12.5 mg Oral Daily  . DISCONTD: carvedilol  12.5 mg Oral BID  . DISCONTD: furosemide  40 mg Oral Daily  . DISCONTD: insulin glargine  10 Units Subcutaneous BID  . DISCONTD: spironolactone  25 mg Oral Daily   Continuous Infusions:  PRN Meds:acetaminophen, acetaminophen, ALPRAZolam  Assessment/Plan: Patient Active Problem List  Diagnoses  . HYPERLIPIDEMIA the patient is on appropriate medications.   Gustavo Lah DEFICIENCY hemoglobin is good at 10.8   . HYPERTENSION blood pressure has improved a bit. She's on his medications.   [name] DIABETES MELLITUS, TYPE I she chronically has very poor control. Blood sugars are up dramatically last night. I suspect the Lantus might of been missed yesterday. At the present time she is no evidence of diabetic ketoacidosis. She did however drop her bicarbonate from 29-21 her sugars route. A repeat will be done this afternoon just to make sure things are doing better. Repeat blood sugars due shortly   . DIABETIC  RETINOPATHY this is been no significant problem in the past.   . CORONARY ATHEROSCLEROSIS NATIVE CORONARY ARTERY she did have positive cardiac enzymes. No additional ischemic workup is intended less these trend upward. She has a tenuous situation at best.    . ISCHEMIC CARDIOMYOPATHY diminished ejection fraction may lead to some of her weakness hypotension.   . CHRONIC SYSTOLIC HEART FAILURE this seems relatively stable.   . ORTHOSTATIC HYPOTENSION perhaps a reduction medications improve this.   [name] FATIGUE     DIABETIC nephropathy: Creatinine is close to her baseline at 1.61   . CVA (cerebral infarction) I see no new deficits on today's exam.   .    . UTI (lower urinary tract infection) she is on appropriate medications and we await the culture to adjust her antibiotics      LOS: 1 day   Christine Schiefelbein ALAN 03/22/2011, 9:01 AM

## 2011-03-22 NOTE — Consult Note (Signed)
Cardiology IP Consult  Reason for Consult: Elevated troponin Referring Physician: Dr. Waynard Edwards  HPI: [name] is a 50 y.o.female with long standing DM1 as well as CAD with late presenting MI in 12/11 (CTO of LAD) and ischemic cardiomyopathy who is here with lethargy and fatigue for the last few weeks.  She has issues with low BP's for the last few months and feels that this may be contributing.  She recently decreased enalapril to 2.5 BID after speaking with Dr. Alford Highland office a few days ago.  She has not had chest pain or SOB.  She has not been febrile recently or had any other systemic symptoms.  She has had some difficulty controlling her blood sugars lately.  She did undergo a cort stim test that was normal back in Dec of 2011.  She has also been diagnosed with orthostatic hypotension thought to be due to diabetic neuropathy.  Past Medical History  Diagnosis Date  . Hypertension   . Vitamin D deficiency   . Coronary artery disease     NSTEMI 12/11. Cardiac catheterization (12/11) showing LAD totalled in the midportion, diagonal 70% stenosed (small 2-mm vessel), circumflex nonobstructive, OM1 40%, OM2 30%, RCA samll and no significat disease.  LAD occlusion was calcified and appeared chronic at the time of cathetrization so no intervention.  . Ischemic cardiomyopathy     Echo 12/11 with EF 30% and periapical akinesis, no significant MR. RV  looked normal. QRS is not wide on ECG.  Echo (1/12):  EF 40-45%, severe hypokinesis of the anteroseptal wall, apical inferior wall, inferoseptal wall, and apex.  . Orthostatic hypotension     sycopal episode in 12/11 likely due to this.  Probably from combination of depressed cardiac out put and diabetic autonomic neuropathy.  . Diabetes mellitus     Insulin dependant Type I   . Hyperlipidemia   . Diabetic nephropathy   . Diabetic retinopathy   . Gastroparesis   . Depression   . Systolic heart failure   . Iron deficiency anemia   . Hx MRSA infection     . Osteopenia   . Esophagitis   . Heart attack   . Stroke May 2012    weakness right side - right hand and right arm the worst    Past Surgical History  Procedure Date  . Appendectomy 1980  . Tubal ligation 1987  . Cesarean section 1984  . Refractive surgery     x3    Family History  Problem Relation Age of Onset  . Pancreatic cancer Father   . Colon cancer Neg Hx   . Diabetes Maternal Grandfather     Social History:  reports that she has never smoked. She has never used smokeless tobacco. She reports that she does not drink alcohol or use illicit drugs.  Allergies:  Allergies  Allergen Reactions  . Erythromycin     REACTION: GI upset  . Food Nausea Only    Green peas  . Metoclopramide Hcl     REACTION: fatique, depression    Current Facility-Administered Medications  Medication Dose Route Frequency Provider Last Rate Last Dose  . 0.9 %  sodium chloride infusion   Intravenous Once Geoffery Lyons, MD      . acetaminophen (TYLENOL) tablet 650 mg  650 mg Oral Q6H PRN Ezequiel Kayser, MD       Or  . acetaminophen (TYLENOL) suppository 650 mg  650 mg Rectal Q6H PRN Ezequiel Kayser, MD      .  ALPRAZolam Prudy Feeler) tablet 0.5 mg  0.5 mg Oral Daily PRN Ezequiel Kayser, MD      . aspirin EC tablet 81 mg  81 mg Oral QHS Ezequiel Kayser, MD      . carvedilol (COREG) tablet 6.25 mg  6.25 mg Oral BID Meridee Score      . ciprofloxacin (CIPRO) tablet 250 mg  250 mg Oral BID Ezequiel Kayser, MD      . citalopram (CELEXA) tablet 20 mg  20 mg Oral Daily Ezequiel Kayser, MD      . clopidogrel (PLAVIX) tablet 75 mg  75 mg Oral Daily Ezequiel Kayser, MD      . digoxin (LANOXIN) tablet 125 mcg  125 mcg Oral QODAY Mark Tonny Branch, MD      . enalapril (VASOTEC) tablet 2.5 mg  2.5 mg Oral BID Meridee Score      . enoxaparin (LOVENOX) injection 40 mg  40 mg Subcutaneous Q24H Ezequiel Kayser, MD      . ferrous sulfate tablet 325 mg  325 mg Oral Q breakfast Ezequiel Kayser, MD      . Donald Prose (FLORA-Q) Capsule 1  capsule  1 capsule Oral Daily Ezequiel Kayser, MD      . furosemide (LASIX) tablet 40 mg  40 mg Oral Daily Ezequiel Kayser, MD      . insulin aspart (novoLOG) injection 0-5 Units  0-5 Units Subcutaneous QHS Ezequiel Kayser, MD      . insulin aspart (novoLOG) injection 0-9 Units  0-9 Units Subcutaneous TID WC Mark A Perini, MD      . insulin aspart (novoLOG) injection 3 Units  3 Units Subcutaneous TID WC Mark A Perini, MD      . insulin glargine (LANTUS) injection 10 Units  10 Units Subcutaneous Q0700 Ezequiel Kayser, MD      . insulin regular (HUMULIN R,NOVOLIN R) 100 units/mL injection 5 Units  5 Units Subcutaneous Once Ezequiel Kayser, MD   5 Units at 03/21/11 2230  . multivitamins ther. w/minerals tablet 1 tablet  1 tablet Oral Daily Ezequiel Kayser, MD      . rosuvastatin (CRESTOR) tablet 40 mg  40 mg Oral Daily Mark A Perini, MD      . sodium chloride 0.9 % injection 3 mL  3 mL Intravenous Q12H Ezequiel Kayser, MD      . spironolactone (ALDACTONE) tablet 12.5 mg  12.5 mg Oral Daily Meridee Score      . DISCONTD: carvedilol (COREG) tablet 12.5 mg  12.5 mg Oral BID Ezequiel Kayser, MD      . DISCONTD: insulin glargine (LANTUS) injection 10 Units  10 Units Subcutaneous BID Ezequiel Kayser, MD      . DISCONTD: spironolactone (ALDACTONE) tablet 25 mg  25 mg Oral Daily Ezequiel Kayser, MD        ROS: A full review of systems is obtained and is negative except as noted in the HPI.  Physical Exam: Blood pressure 115/67, pulse 73, temperature 98.8 F (37.1 C), temperature source Oral, resp. rate 16, height 5\' 5"  (1.651 m), weight 72.3 kg (159 lb 6.3 oz), last menstrual period 03/13/2011, SpO2 98.00%.  GENERAL: no acute distress.  EYES: Extra ocular movements are intact. There is no lid lag. Sclera is anicteric.  ENT: Oropharynx is clear. Dentition is within normal limits.  NECK: Supple. The thyroid is not enlarged.  LYMPH: There are no masses or lymphadenopathy present.  HEART: Regular rate and rhythm with no  m/g/r.  Normal S1/S2. No JVD LUNGS: Clear to auscultation There are no rales, rhonchi, or wheezes.  ABDOMEN: Soft, non-tender, and non-distended with normoactive bowel sounds. There is no hepatosplenomegaly.  EXTREMITIES: No clubbing, cyanosis, or edema.  PULSES: . Femoral pulses were +1 and equal bilaterally. DP/PT pulses were +1 and equal bilaterally.  SKIN: Warm, dry, and intact.  NEUROLOGIC: The patient was oriented to person, place, and time. SHe has residual RUE weakness PSYCH: Normal judgment and insight, mood is appropriate.    Results: Lab Results  Component Value Date   WBC 10.9* 03/21/2011   HGB 12.8 03/21/2011   HCT 38.0 03/21/2011   MCV 90.5 03/21/2011   PLT 281 03/21/2011    Lab Results  Component Value Date   CREATININE 1.67* 03/21/2011   BUN 37* 03/21/2011   NA 139 03/21/2011   K 4.4 03/21/2011   CL 100 03/21/2011   CO2 29 03/21/2011    Lab Results  Component Value Date   ALT 20 03/21/2011   AST 20 03/21/2011   ALKPHOS 105 03/21/2011   BILITOT 0.3 03/21/2011    Lab Results  Component Value Date   CKTOTAL 68 03/21/2011   CKMB 4.4* 03/21/2011   TROPONINI 0.70* 03/21/2011    CXR: pending CT head: no acute abn UA: pos nitrites, leukocytes  EKG: NSR with anteroseptal infarct  Assessment/Plan: 50 yo WF with longstanding DM1, CAD with ischemic CM (LAD infarct CTO at time of LHC) with weeks to months of lethargy and low BP.  1. Hypotension: likely due to medications.  Also would consider hypoaldo; however, she had normal ACTH stim test last year - enalapril decreased to 2.5 BID this week - decrease aldactone to 12.5 - decrease coreg to 6.25 - if BP still running low may need to d/c aldactone  2. Elev trop: suspect due to cardiomyopathy.  It is not trending up or down and is not consistent with acute MI - repeat x 2  3. Ischemic CM: most recent LVEF 45%, euvolemic currently - continue meds as above - asa and plavix daily - statin  4.  Leukocytosis/abnormal urinalysis - culture pending and receiving emperic cipro  5. DM1: difficult to control glucose - management per hosp   Luis Sami 03/22/2011, 1:09 AM

## 2011-03-22 NOTE — Progress Notes (Signed)
@   Subjective:  Denies CP or dyspnea   Objective:  Filed Vitals:   03/21/11 1937 03/22/11 0002 03/22/11 0018 03/22/11 0600  BP: 110/54 122/57 115/67 114/62  Pulse: 63 72 73 74  Temp: 98.5 F (36.9 C) 98.3 F (36.8 C) 98.8 F (37.1 C) 98.4 F (36.9 C)  TempSrc: Oral Oral Oral Oral  Resp: 12 18 16 16   Height:   5\' 5"  (1.651 m)   Weight:   159 lb 6.3 oz (72.3 kg) 159 lb 6.3 oz (72.3 kg)  SpO2: 100% 96% 98% 96%    Intake/Output from previous day:  Intake/Output Summary (Last 24 hours) at 03/22/11 0716 Last data filed at 03/22/11 0100  Gross per 24 hour  Intake      0 ml  Output    350 ml  Net   -350 ml    Physical Exam: Physical exam: Well-developed well-nourished in no acute distress.  Skin is warm and dry.  HEENT is normal.  Neck is supple.  Chest is clear to auscultation with normal expansion.  Cardiovascular exam is regular rate and rhythm.  Abdominal exam nontender or distended. No masses palpated. Extremities show no edema.    Lab Results: Basic Metabolic Panel:  Basename 03/22/11 0133 03/21/11 1825  NA 134* 139  K 4.6 4.4  CL 97 100  CO2 20 29  GLUCOSE 586* 209*  BUN 39* 37*  CREATININE 1.61* 1.67*  CALCIUM 9.0 10.3  MG -- --  PHOS -- --   Liver Function Tests:  [facility] 03/22/11 0133 03/21/11 1825  AST 15 20  ALT 16 20  ALKPHOS 88 105  BILITOT 0.3 0.3  PROT 6.9 7.9  ALBUMIN 3.2* 3.7   No results found for this basename: LIPASE:2,AMYLASE:2 in the last 72 hours CBC:  Basename 03/22/11 0133 03/21/11 1825  WBC 10.7* 10.9*  NEUTROABS 6.7 7.3  HGB 11.9* 12.8  HCT 35.8* 38.0  MCV 92.7 90.5  PLT 249 281   Cardiac Enzymes:  Basename 03/21/11 2044 03/21/11 1825  CKTOTAL 68 75  CKMB 4.4* 4.2*  CKMBINDEX -- --  TROPONINI 0.70* 0.68*     Assessment/Plan:  1) elevated troponin - No chest pain, no ECG changes and no clear trend up in enzymes; most likely related to renal insufficiency. Await f/u enzymes. If trend remains the same, no  plans for further ischemia eval. 2) weakness - most likely combination of UTI, hyperglycemia and low BP from cardiac meds; agree with reduced dose of meds and will adjust based on fu BP. Change lasix to 20 mg daily 3) ICM - Continue lower dose beta blocker and ACEI  Olga Millers 03/22/2011, 7:16 AM

## 2011-03-23 DIAGNOSIS — I2510 Atherosclerotic heart disease of native coronary artery without angina pectoris: Secondary | ICD-10-CM

## 2011-03-23 LAB — URINE CULTURE
Colony Count: >=100000
Culture  Setup Time: 201211290604

## 2011-03-23 LAB — COMPREHENSIVE METABOLIC PANEL
ALT: 17 U/L (ref 0–35)
AST: 23 U/L (ref 0–37)
Alkaline Phosphatase: 90 U/L (ref 39–117)
CO2: 26 mEq/L (ref 19–32)
Calcium: 9 mg/dL (ref 8.4–10.5)
Chloride: 98 mEq/L (ref 96–112)
GFR calc Af Amer: 53 mL/min — ABNORMAL LOW (ref >90)
GFR calc non Af Amer: 46 mL/min — ABNORMAL LOW (ref >90)
Glucose, Bld: 470 mg/dL — ABNORMAL HIGH (ref 70–99)
Sodium: 133 mEq/L — ABNORMAL LOW (ref 135–145)
Total Bilirubin: 0.2 mg/dL — ABNORMAL LOW (ref 0.3–1.2)

## 2011-03-23 LAB — DIFFERENTIAL
Basophils Absolute: 0 10*3/uL (ref 0.0–0.1)
Basophils Relative: 0 % (ref 0–1)
Eosinophils Absolute: 0.9 10*3/uL — ABNORMAL HIGH (ref 0.0–0.7)
Neutro Abs: 4.8 10*3/uL (ref 1.7–7.7)
Neutrophils Relative %: 52 % (ref 43–77)

## 2011-03-23 LAB — CBC
Hemoglobin: 12.7 g/dL (ref 12.0–15.0)
MCH: 30.3 pg (ref 26.0–34.0)
MCHC: 33.4 g/dL (ref 30.0–36.0)
Platelets: 240 10*3/uL (ref 150–400)
RDW: 13.8 % (ref 11.5–15.5)

## 2011-03-23 LAB — GLUCOSE, CAPILLARY: Glucose-Capillary: 168 mg/dL — ABNORMAL HIGH (ref 70–99)

## 2011-03-23 LAB — GLUCOSE, RANDOM: Glucose, Bld: 495 mg/dL — ABNORMAL HIGH (ref 70–99)

## 2011-03-23 MED ORDER — INSULIN ASPART 100 UNIT/ML ~~LOC~~ SOLN
15.0000 [IU] | Freq: Once | SUBCUTANEOUS | Status: AC
  Administered 2011-03-23: 15 [IU] via SUBCUTANEOUS

## 2011-03-23 MED ORDER — INSULIN GLARGINE 100 UNIT/ML ~~LOC~~ SOLN
15.0000 [IU] | SUBCUTANEOUS | Status: DC
  Administered 2011-03-24: 15 [IU] via SUBCUTANEOUS
  Filled 2011-03-23: qty 3

## 2011-03-23 NOTE — Progress Notes (Signed)
@   Subjective:  Denies CP or dyspnea; energy improved   Objective:  Filed Vitals:   03/22/11 1046 03/22/11 1314 03/22/11 2226 03/23/11 0634  BP:  112/65 159/71 147/76  Pulse: 78 68 72 68  Temp:  98.3 F (36.8 C) 98.4 F (36.9 C) 98.4 F (36.9 C)  TempSrc:  Oral    Resp:  16 18 20   Height:      Weight:    161 lb 3.2 oz (73.12 kg)  SpO2:  96% 97% 98%    Intake/Output from previous day:  Intake/Output Summary (Last 24 hours) at 03/23/11 0717 Last data filed at 03/23/11 0500  Gross per 24 hour  Intake    340 ml  Output   2051 ml  Net  -1711 ml    Physical Exam: Physical exam: Well-developed well-nourished in no acute distress.  Skin is warm and dry.  HEENT is normal.  Neck is supple.  Chest is clear to auscultation with normal expansion.  Cardiovascular exam is regular rate and rhythm.  Abdominal exam nontender or distended. No masses palpated. Extremities show no edema.    Lab Results: Basic Metabolic Panel:  Basename 03/23/11 0535 03/22/11 1346  NA 133* 138  K 4.6 4.4  CL 98 102  CO2 26 26  GLUCOSE 470* 346*  BUN 30* 33*  CREATININE 1.31* 1.42*  CALCIUM 9.0 9.5  MG -- --  PHOS -- --   Liver Function Tests:  [facility] 03/23/11 0535 03/22/11 0133  AST 23 15  ALT 17 16  ALKPHOS 90 88  BILITOT 0.2* 0.3  PROT 6.8 6.9  ALBUMIN 3.0* 3.2*   No results found for this basename: LIPASE:2,AMYLASE:2 in the last 72 hours CBC:  Basename 03/23/11 0535 03/22/11 0133  WBC 9.2 10.7*  NEUTROABS 4.8 6.7  HGB 12.7 11.9*  HCT 38.0 35.8*  MCV 90.7 92.7  PLT 240 249   Cardiac Enzymes:  Basename 03/23/11 0535 03/21/11 2044 03/21/11 1825  CKTOTAL -- 68 75  CKMB -- 4.4* 4.2*  CKMBINDEX -- -- --  TROPONINI <0.30 0.70* 0.68*     Assessment/Plan:  1) elevated troponin - No chest pain, no ECG changes and no clear trend up in enzymes; most likely related to renal insufficiency; no plans for further ischemia eval. 2) weakness - most likely combination of UTI,  hyperglycemia and low BP from cardiac meds; her energy is improving. Continue lower dose cardiac meds; fu with Dr. Shirlee Latch following DC for further med titration. 3) ICM - Continue lower dose beta blocker and ACEI Patient can be dced from a cardiac standpoint; please call with questions Olga Millers 03/23/2011, 7:17 AM

## 2011-03-23 NOTE — Progress Notes (Signed)
Subjective: Pt feels well but for some reason her sugar has shot back up to 495!!. No extra food. No lows with bouncing. No pain or other issues. No nauseas. Bicarb is still OK   Objective: Vital signs in last 24 hours: Temp:  [98.3 F (36.8 C)-98.4 F (36.9 C)] 98.4 F (36.9 C) (11/30 0634) Pulse Rate:  [68-78] 68  (11/30 0634) Resp:  [16-20] 20  (11/30 0634) BP: (112-159)/(65-76) 147/76 mmHg (11/30 0634) SpO2:  [96 %-98 %] 98 % (11/30 0634) Weight:  [73.12 kg (161 lb 3.2 oz)] 161 lb 3.2 oz (73.12 kg) (11/30 8119)  Intake/Output from previous day: 11/29 0701 - 11/30 0700 In: 340 [P.O.:340] Out: 2051 [Urine:2050; Stool:1] Intake/Output this shift:    General: alert nontoxic, moist oral membranes. Neck supple. Lungs clear Ht regular, abd soft NT No edema. Awake alert mentating well  Lab Results   [facility] 03/23/11 0535 03/22/11 0133  WBC 9.2 10.7*  RBC 4.19 3.86*  HGB 12.7 11.9*  HCT 38.0 35.8*  MCV 90.7 92.7  MCH 30.3 30.8  RDW 13.8 13.8  PLT 240 249    Basename 03/23/11 0810 03/23/11 0535 03/22/11 1346  NA -- 133* 138  K -- 4.6 4.4  CL -- 98 102  CO2 -- 26 26  GLUCOSE 495* 470* --  BUN -- 30* 33*  CREATININE -- 1.31* 1.42*  CALCIUM -- 9.0 9.5    Studies/Results: Ct Head Wo Contrast  03/21/2011  *RADIOLOGY REPORT*  Clinical Data: Hyperglycemia, weakness and hypotension.  History of stroke.  CT HEAD WITHOUT CONTRAST  Technique:  Contiguous axial images were obtained from the base of the skull through the vertex without contrast.  Comparison: Head CT 06/26/2010.  MRI of the brain 08/28/2010.  Findings: There is subtle low density in the periventricular white matter on image 21, corresponding with the small infarct demonstrated on the prior MRI. There is no evidence of acute intracranial hemorrhage, mass lesion, brain edema or extra-axial fluid collection.  The ventricles and subarachnoid spaces are appropriately sized for age.  There is no CT evidence of acute  cortical infarction.  There are prominent calcifications of the left vertebral and internal carotid arteries.  The visualized paranasal sinuses are clear. The calvarium is intact.  IMPRESSION: No acute intracranial findings.  Original Report Authenticated By: Gerrianne Scale, M.D.    Scheduled Meds:   . aspirin EC  81 mg Oral QHS  . carvedilol  6.25 mg Oral BID  . Chlorhexidine Gluconate Cloth  6 each Topical Q0600  . ciprofloxacin  250 mg Oral BID  . citalopram  20 mg Oral Daily  . clopidogrel  75 mg Oral Daily  . digoxin  125 mcg Oral QODAY  . enalapril  2.5 mg Oral BID  . enoxaparin  40 mg Subcutaneous Q24H  . ferrous sulfate  325 mg Oral Q breakfast  . Flora-Q  1 capsule Oral Daily  . furosemide  20 mg Oral Daily  . insulin aspart  0-5 Units Subcutaneous QHS  . insulin aspart  0-9 Units Subcutaneous TID WC  . insulin aspart  15 Units Subcutaneous Once  . insulin aspart  3 Units Subcutaneous TID WC  . insulin glargine  15 Units Subcutaneous Q0700  . multivitamins ther. w/minerals  1 tablet Oral Daily  . mupirocin ointment  1 application Nasal BID  . rosuvastatin  40 mg Oral Daily  . sodium chloride  3 mL Intravenous Q12H  . spironolactone  12.5 mg Oral Daily  .  DISCONTD: insulin glargine  10 Units Subcutaneous Q0700   Continuous Infusions:  PRN Meds:acetaminophen, acetaminophen, ALPRAZolam  Assessment/Plan: Patient Active Problem List  Diagnoses  . HYPERLIPIDEMIA: on Rx  . 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 HYPERTENSION: BP 112-159 sys  . DIABETES MELLITUS, TYPE I: I have no idea why she shot up so high, will give 15 novolog and increase lantus to 14. Recheck in a couple of hrs. No DKA  . DIABETIC  RETINOPATHY  . CORONARY ATHEROSCLEROSIS NATIVE CORONARY ARTERY: troponin in back to normal  . ISCHEMIC CARDIOMYOPATHY: stable  . CHRONIC SYSTOLIC HEART FAILURE: no issues  . ORTHOSTATIC HYPOTENSION: hasn't been upn much  . FATIGUE: improved  .  S  . CVA (cerebral  infarction): no new deficit  .    [name] UTI (lower urinary tract infection): on Rx, cx pending     LOS: 2 days   Joee Iovine ALAN 03/23/2011, 9:11 AM

## 2011-03-23 NOTE — Progress Notes (Signed)
Patient had an initial CBG this am of 471.  Stat glucose was ordered per protocol.  MD on call was notified and was to give 20 units of novolog after blood glucose was resulted.  Dr. Evlyn Kanner was rounding on patient and he decided to give 15 units of novolog stat.   Glucose was 497 at 0810.  Novolog given. Will continue to monitor patient.

## 2011-03-23 NOTE — Progress Notes (Signed)
Patient ambulated in hallway >30 feet. Patient tolerated well. Will continue to monitor.

## 2011-03-24 LAB — COMPREHENSIVE METABOLIC PANEL
ALT: 19 U/L (ref 0–35)
Albumin: 3.2 g/dL — ABNORMAL LOW (ref 3.5–5.2)
Alkaline Phosphatase: 97 U/L (ref 39–117)
BUN: 24 mg/dL — ABNORMAL HIGH (ref 6–23)
Chloride: 97 mEq/L (ref 96–112)
GFR calc Af Amer: 61 mL/min — ABNORMAL LOW (ref >90)
Glucose, Bld: 469 mg/dL — ABNORMAL HIGH (ref 70–99)
Potassium: 4.4 mEq/L (ref 3.5–5.1)
Sodium: 132 mEq/L — ABNORMAL LOW (ref 135–145)
Total Bilirubin: 0.3 mg/dL (ref 0.3–1.2)
Total Protein: 7.3 g/dL (ref 6.0–8.3)

## 2011-03-24 LAB — CBC
HCT: 40.3 % (ref 36.0–46.0)
MCHC: 33.5 g/dL (ref 30.0–36.0)
MCV: 91.4 fL (ref 78.0–100.0)
Platelets: 256 10*3/uL (ref 150–400)
RDW: 13.6 % (ref 11.5–15.5)
WBC: 8.3 10*3/uL (ref 4.0–10.5)

## 2011-03-24 LAB — DIFFERENTIAL
Basophils Absolute: 0 10*3/uL (ref 0.0–0.1)
Basophils Relative: 0 % (ref 0–1)
Eosinophils Relative: 5 % (ref 0–5)
Monocytes Absolute: 0.6 10*3/uL (ref 0.1–1.0)

## 2011-03-24 MED ORDER — CARVEDILOL 6.25 MG PO TABS: 6.2500 mg | ORAL_TABLET | Freq: Two times a day (BID) | ORAL | Status: DC

## 2011-03-24 MED ORDER — CIPROFLOXACIN HCL 250 MG PO TABS: 250.0000 mg | ORAL_TABLET | Freq: Two times a day (BID) | ORAL | Status: AC

## 2011-03-24 NOTE — Progress Notes (Signed)
Pt discharged home with mother

## 2011-03-24 NOTE — Progress Notes (Signed)
Subjective: Patient seems to be doing well. She sitting up in a chair and once discharged today. She acknowledges that her blood sugars have been labile but relates that this is her baseline at home and indeed an A1c of 12 suggest this. She is much stronger denies any chest pain or shortness of breath. Urinary output is good. Intake is excellent. She is ambulating and per her request is ready for discharge. She has an excellent grasp of her diabetes and regimen and the ability to maintain her blood sugars with sliding scale.  Objective: Vital signs in last 24 hours: Temp:  [98 F (36.7 C)-98.3 F (36.8 C)] 98.3 F (36.8 C) (12/01 0454) Pulse Rate:  [63-69] 66  (12/01 0638) Resp:  [18] 18  (12/01 0638) BP: (98-150)/(59-77) 125/76 mmHg (12/01 0638) SpO2:  [97 %-99 %] 97 % (12/01 0981) Weight:  [73 kg (160 lb 15 oz)] 160 lb 15 oz (73 kg) (12/01 1914) Weight change: -0.12 kg (-4.2 oz)  CBG (last 3)   Basename 03/24/11 0817 03/24/11 0621 03/23/11 2204  GLUCAP 432* 433* 193*    Intake/Output from previous day: 11/30 0701 - 12/01 0700 In: 1180 [P.O.:1180] Out: 700 [Urine:700]  Physical Exam: BP 125/76  Pulse 66  Temp(Src) 98.3 F (36.8 C) (Oral)  Resp 18  Ht 5\' 5"  (1.651 m)  Wt 73 kg (160 lb 15 oz)  BMI 26.78 kg/m2  SpO2 97%  LMP 03/13/2011 Patient is awake alert sitting up in a chair. She is no JVD or bruits. Lungs are clear to auscultation and percussion. Cardiovascular exam regular rate and rhythm no murmur gallop rub or heave. Abdomen is benign. Extremities with intact distal pulses no edema. Neurologically she is intact.   Lab Results:  [facility] 03/24/11 0700 03/23/11 0810 03/23/11 0535  NA 132* -- 133*  K 4.4 -- 4.6  CL 97 -- 98  CO2 23 -- 26  GLUCOSE 469* 495* --  BUN 24* -- 30*  CREATININE 1.17* -- 1.31*  CALCIUM 9.3 -- 9.0  MG -- -- --  PHOS -- -- --    Basename 03/24/11 0700 03/23/11 0535  AST 23 23  ALT 19 17  ALKPHOS 97 90  BILITOT 0.3 0.2*  PROT 7.3  6.8  ALBUMIN 3.2* 3.0*    Basename 03/24/11 0700 03/23/11 0535  WBC 8.3 9.2  NEUTROABS 4.9 4.8  HGB 13.5 12.7  HCT 40.3 38.0  MCV 91.4 90.7  PLT 256 240   Lab Results  Component Value Date   INR 1.09 03/27/2010    Basename 03/23/11 0535 03/21/11 2044 03/21/11 1825  CKTOTAL -- 68 75  CKMB -- 4.4* 4.2*  CKMBINDEX -- -- --  TROPONINI <0.30 0.70* 0.68*    Basename 03/22/11 0133  TSH 3.924  T4TOTAL --  T3FREE --  THYROIDAB --    Basename 03/22/11 0133  VITAMINB12 523  FOLATE --  FERRITIN --  TIBC --  IRON --  RETICCTPCT --    Studies/Results: No results found.   Assessment/Plan #1 diabetes mellitus-this is type I insulin-dependent for a labile but able to handle at home.  #2 coronary artery disease no evidence of decompensation  #3 ischemic cardiomyopathy with CHF chronic stable  #4 essential hypertension stable  #5 UTI stable  Plan discharge home   LOS: 3 days   Zafar Debrosse A 03/24/2011, 9:08 AM

## 2011-03-24 NOTE — Progress Notes (Signed)
Pt CBG this AM is 433 prior to Lantus administration. Pt given 15 units Lantus per orders.  Ordered a stat serum blood glucose to verify per orders. Waiting on lab results. Will notify MD once lab results received. Pt is asymptomatic. Will continue to monitor. Florestine Avers M

## 2011-03-26 ENCOUNTER — Telehealth: Payer: Self-pay | Admitting: Cardiology

## 2011-03-26 MED ORDER — CLOPIDOGREL BISULFATE 75 MG PO TABS: 75.0000 mg | ORAL_TABLET | Freq: Every day | ORAL | Status: DC

## 2011-03-26 NOTE — Telephone Encounter (Signed)
New problem Pt wants refill of plavix [facility] [place] Let her know when done

## 2011-03-27 NOTE — Progress Notes (Signed)
Utilization review completed.  

## 2011-04-06 ENCOUNTER — Ambulatory Visit (INDEPENDENT_AMBULATORY_CARE_PROVIDER_SITE_OTHER): Payer: Medicaid Other | Admitting: Cardiology

## 2011-04-06 ENCOUNTER — Encounter: Payer: Self-pay | Admitting: Cardiology

## 2011-04-06 DIAGNOSIS — I639 Cerebral infarction, unspecified: Secondary | ICD-10-CM

## 2011-04-06 DIAGNOSIS — I6350 Cerebral infarction due to unspecified occlusion or stenosis of unspecified cerebral artery: Secondary | ICD-10-CM

## 2011-04-06 DIAGNOSIS — I5020 Unspecified systolic (congestive) heart failure: Secondary | ICD-10-CM

## 2011-04-06 DIAGNOSIS — E785 Hyperlipidemia, unspecified: Secondary | ICD-10-CM

## 2011-04-06 DIAGNOSIS — I5022 Chronic systolic (congestive) heart failure: Secondary | ICD-10-CM

## 2011-04-06 DIAGNOSIS — I2510 Atherosclerotic heart disease of native coronary artery without angina pectoris: Secondary | ICD-10-CM

## 2011-04-06 LAB — BASIC METABOLIC PANEL
BUN: 28 mg/dL — ABNORMAL HIGH (ref 6–23)
Chloride: 105 mEq/L (ref 96–112)
Creatinine, Ser: 1.5 mg/dL — ABNORMAL HIGH (ref 0.4–1.2)
GFR: 38.64 mL/min — ABNORMAL LOW (ref >60.00)
Glucose, Bld: 193 mg/dL — ABNORMAL HIGH (ref 70–99)

## 2011-04-06 LAB — LIPID PANEL
Cholesterol: 157 mg/dL (ref 0–200)
LDL Cholesterol: 76 mg/dL (ref 0–99)
Triglycerides: 132 mg/dL (ref 0.0–149.0)
VLDL: 26.4 mg/dL (ref 0.0–40.0)

## 2011-04-06 LAB — BRAIN NATRIURETIC PEPTIDE: Pro B Natriuretic peptide (BNP): 103 pg/mL — ABNORMAL HIGH (ref 0.0–100.0)

## 2011-04-06 NOTE — Assessment & Plan Note (Signed)
Stable coronary disease with occluded mid-LAD.  She had quite elevated cardiac enzymes when she was hospitalized with DKA in 3/12, but her coronary disease was unchanged.  I am unsure what caused the enzyme elevation.  Perhaps she had a hypoperfusion event early in the course of DKA with demand cardiac ischemia.  She had a mild elevation of TnI during her most recent hospitalization that I think was due to demand ischemia (hypotension, hyperglycemia, ARF). - Continue ASA 81, continue Plavix.   - Continue Coreg, enalapril, statin.

## 2011-04-06 NOTE — Assessment & Plan Note (Signed)
Check lipids, goal LDL < 70.  

## 2011-04-06 NOTE — Progress Notes (Signed)
PCP: Dr. Evlyn Kanner  50 yo with history of Type I diabetes, CAD, and ischemic cardiomyopathy presents for cardiology followup.  She had a prolonged admission with DKA and systolic CHF in 12/11.  Cardiac enzymes were found to be elevated and left heart cath showed occluded LAD, which appeared chronic at the time of cath.  Echo showed peri-apical akinesis with EF 30%.  Management was complicated by orthostatic hypotension causing syncope.  This was likely due to combination of low cardiac output and diabetic autonomic neuropathy.  This limited titration of cardiac medications initially. Repeat echo (1/12) showed EF 40-45% (improved).  She was readmitted with DKA in 3/12, and cardiac enzymes were noted to be quite elevated with troponin peaking at 24 and CKMB at 69.  Repeat LHC was done, actually showing no significant difference from the 12/11 study.    In 5/12, she noted weakness one day in her right leg and arm.  She had a head MRI done, showing a small acute left parietal infarction.  This stroke occurred while on ASA and Plavix.  She has improved but her left arm still has some weakness. Carotid dopplers showed no significant carotid stenosis.  I did a limited echo to look for LV thrombus, and this was not seen.  3 week event monitor showed no atrial fibrillation.    She was readmitted in 11/12 with fatigue/lethargy and was found to have a hyperglycemic nonketotic state, UTI, ARF, and mild elevation of troponin to 0.7.  BP was low and cardiac medications were cut back.  It was suspected that her mild troponin elevation probably represented demand ischemia.   Since discharge, she has been doing well.  No chest pain.  She is walking without exertional dyspnea.  No lightheadedness or dizziness, SBP is 102 today.  Weight is down 5 lbs since last appointment.   Labs (12/11): K 3.7, creatinine 0.88, BNP 612, LDL 29, HDL 44 Labs (1/12): digoxin 1.0, BNP 199, K 4.4, creatinine 0.9 Labs (5/12): digoxin 0.4 Labs  (6/12): K 4.2, creatinine 0.99 Labs (7/12): K 6.2, creatinine 1.3 (KCl and spironolactone held).  Labs (7/12), repeat: K 4.8, creatinine 1.2, BNP 173 Labs (11/12): K 4.6, creatinine 1.6 => 1.3, TnI 0.7  Allergies (verified):  1)  Reglan 2)  Erythromycin  Past Medical History: 1. Hypertension: However, after MI had orthostatic hypotension.  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 CAD: NSTEMI 12/11.  Cardiac catheterization (12/11) showing LAD totalled in the midportion, diagonal 1 70% stenosed (small 2-mm vessel), circumflex nonobstructive, OM1 40%, OM2 30%, RCA small and no significant disease.  LAD occlusion was calcified and appeared chronic at the time of catheterization so no intervention.  Patient readmitted with DKA in 3/12, troponin found to be 24 and CKMB 69.  Repeat LHC showed no change from 12/11 study.  5. Ischemic cardiomyopathy: Echo (12/11) with EF 30% and periapical akinesis, no significant MR, RV looked normal.  QRS is not wide on ECG.  Echo (1/12): EF 40-45%, severe hypokinesis of the anteroseptal wall, apical inferior wall, inferoseptal wall, and apex.  Hyperkalemia with spironolactone.  6. Orthostatic hypotension: Syncopal episode in 12/11 likely due to this.  Probably from combination of depressed cardiac output and diabetic autonomic neuropathy.  7. Type 1 insulin-dependent diabetes. History of DKA.  8. Hyperlipidemia.  9. Diabetic retinopathy.  10. Gastroparesis.  11. Diabetic nephropathy  12. Depression.  13. 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  Carotid dopplers (7/12) with no significant disease.  Limited echo  in 7/12 did not show an LV apical thrombus.  3-week event monitor in 7/12 did not show any runs of atrial fibrillation.   Family History: No FH of Colon Cancer: Family History of Pancreatic Cancer: Father deceased 78 No premature CAD  Social History: Divorced, 1 boy unemployed, lives in [place] Patient has never smoked.  Alcohol Use  - no Daily Caffeine Use 1 cup/day Illicit Drug Use - no  Review of Systems        All systems reviewed and negative except as per HPI.   Current Outpatient Prescriptions  Medication Sig Dispense Refill  . ALPRAZolam (XANAX) 0.5 MG tablet Take 0.5 mg by mouth daily as needed.       [name] aspirin EC 81 MG tablet Take 81 mg by mouth at bedtime.       . Calcium Carbonate-Vit D-Min 600-200 MG-UNIT TABS Take 1 tablet by mouth daily.        . carvedilol (COREG) 6.25 MG tablet Take 1 tablet (6.25 mg total) by mouth 2 (two) times daily.  60 tablet  5  . citalopram (CELEXA) 20 MG tablet Take 20 mg by mouth daily.        . clopidogrel (PLAVIX) 75 MG tablet Take 1 tablet (75 mg total) by mouth daily.  30 tablet  5  . digoxin (LANOXIN) 0.125 MG tablet Take 125 mcg by mouth every other day.       . enalapril (VASOTEC) 2.5 MG tablet 1/2 of a 5mg  tablet bid  30 tablet  11  . ferrous sulfate 325 (65 FE) MG tablet Take 325 mg by mouth daily with breakfast.        . furosemide (LASIX) 40 MG tablet Take 1 tablet (40 mg total) by mouth daily.  30 tablet  6  . insulin aspart (NOVOLOG) 100 UNIT/ML injection Inject 2-8 Units into the skin 3 (three) times daily before meals. Sliding scale      . insulin glargine (LANTUS) 100 UNIT/ML injection Inject 10-14 Units into the skin as directed. Patient states 14 units every morning and 10 to 12 units every evening      . Multiple Vitamins-Iron (QC DAILY MULTIVITAMINS/IRON) TABS Take 1 tablet by mouth daily.        . rosuvastatin (CRESTOR) 40 MG tablet Take 40 mg by mouth at bedtime as needed.   30 tablet  11  . DISCONTD: enalapril (VASOTEC) 5 MG tablet Take 1 tablet (5 mg total) by mouth daily.  60 tablet  6    BP 102/62  Pulse 72  Ht 5' 4.5" (1.638 m)  Wt 72.122 kg (159 lb)  BMI 26.87 kg/m2  LMP 03/13/2011 General:  Well developed, well nourished, in no acute distress. Head:  Annular skin lesion on forehead.  Neck:  Neck supple, no JVD. No masses, thyromegaly or  abnormal cervical nodes. Lungs:  Clear bilaterally to auscultation and percussion. Heart:  Non-displaced PMI, chest non-tender; regular rate and rhythm, S1, S2 without murmurs, rubs or gallops. Carotid upstroke normal, no bruit. Pedals normal pulses. No edema.  Abdomen:  Bowel sounds positive; abdomen soft and non-tender without masses, organomegaly, or hernias noted. No hepatosplenomegaly. Extremities:  No clubbing or cyanosis. Neurologic:  Alert and oriented x 3. Right arm is weak.  Psych:  Normal affect.

## 2011-04-06 NOTE — Patient Instructions (Signed)
Your physician recommends that you have lab work today--Lipid profile/BMET/BNP/Digoxin level 414.01  Your physician recommends that you schedule a follow-up appointment in: 3 months with Dr Shirlee Latch.

## 2011-04-06 NOTE — Assessment & Plan Note (Signed)
Weight is down and she appears euvolemic with NYHA class II symptoms.  EF 40-45% on last echo.  Off spironolactone as she developed hyperkalemia while taking this medication.  Her Coreg and enalapril doses have been lowered due to hypotension.  I will continue her on the current doses of Coreg, enalapril, digoxin, and lasix.  Will check BMET, digoxin level, and BNP today.

## 2011-04-06 NOTE — Assessment & Plan Note (Signed)
Occurred while on aspirin and Plavix.  Some residual right arm weakness.  Carotid dopplers showed no significant disease and limited echo to look for LV apical thrombus was unremarkable.  3-week monitor did not show atrial fibrillation.

## 2011-04-07 NOTE — Discharge Summary (Signed)
DISCHARGE SUMMARY  [name]  MR#: [id number]  DOB:02-08-1961  Date of Admission: 03/21/2011 Date of Discharge: 04/07/2011  Attending Physician:Case Vassell ALAN  Patient's WUJ:[id number] [name], MD  Consults: none  Discharge Diagnoses: Active Problems:  UTI (lower urinary tract infection) Patient Active Problem List  Diagnoses  . HYPERLIPIDEMIA under control    . ANEMIA-IRON DEFICIENCY  This is doing well  . HYPERTENSION some supine hypertension is expected  . DIABETES MELLITUS, TYPE I this continues to be a major challenge  . DIABETIC  RETINOPATHY clinically stable  . CORONARY ATHEROSCLEROSIS NATIVE CORONARY ARTERY: this remained clinically stable  . ISCHEMIC CARDIOMYOPATHY  . CHRONIC SYSTOLIC HEART FAILURE: no flares during this visit  . ORTHOSTATIC HYPOTENSION: now back on medication  .   DIABETIC nephropathy: Creatinine is stable at 1.5  .   . CVA (cerebral infarction): stable cliinically with a negative CT this admission                 Discharge Medications: Discharge Medication List as of 03/24/2011 11:16 AM    START taking these medications   Details  ciprofloxacin (CIPRO) 250 MG tablet Take 1 tablet (250 mg total) by mouth 2 (two) times daily., Starting 03/24/2011, Until Tue 04/03/11, Print      CONTINUE these medications which have CHANGED   Details  carvedilol (COREG) 6.25 MG tablet Take 1 tablet (6.25 mg total) by mouth 2 (two) times daily., Starting 03/24/2011, Until Sun 03/23/12, Print      CONTINUE these medications which have NOT CHANGED   Details  ALPRAZolam (XANAX) 0.5 MG tablet Take 0.5 mg by mouth daily as needed. , Until Discontinued, Historical Med    aspirin EC 81 MG tablet Take 81 mg by mouth at bedtime. , Starting 10/17/2010, Until Discontinued, Historical Med    Calcium Carbonate-Vit D-Min 600-200 MG-UNIT TABS Take 1 tablet by mouth daily.  , Until Discontinued, Historical Med    citalopram (CELEXA) 20 MG tablet Take 20 mg by mouth  daily.  , Until Discontinued, Historical Med    digoxin (LANOXIN) 0.125 MG tablet Take 125 mcg by mouth every other day. , Until Discontinued, Historical Med    enalapril (VASOTEC) 2.5 MG tablet 1/2 of a 5mg  tablet bid, Historical Med    furosemide (LASIX) 40 MG tablet Take 1 tablet (40 mg total) by mouth daily., Starting 01/19/2011, Until Sat 01/19/12, Normal    insulin aspart (NOVOLOG) 100 UNIT/ML injection Inject 2-8 Units into the skin 3 (three) times daily before meals. Sliding scale, Until Discontinued, Historical Med    insulin glargine (LANTUS) 100 UNIT/ML injection Inject 10-14 Units into the skin as directed. Patient states 14 units every morning and 10 to 12 units every evening, Until Discontinued, Historical Med    Multiple Vitamins-Iron (QC DAILY MULTIVITAMINS/IRON) TABS Take 1 tablet by mouth daily.  , Until Discontinued, Historical Med    rosuvastatin (CRESTOR) 40 MG tablet Take 40 mg by mouth at bedtime as needed. , Starting 08/17/2010, Until Discontinued, Historical Med    clopidogrel (PLAVIX) 75 MG tablet Take 75 mg by mouth daily.  , Until Discontinued, Historical Med    spironolactone (ALDACTONE) 25 MG tablet Take 25 mg by mouth daily.  , Until Discontinued, Historical Med    ferrous sulfate 325 (65 FE) MG tablet Take 325 mg by mouth daily with breakfast.  , Until Discontinued, Historical Med    diphenoxylate-atropine (LOMOTIL) 2.5-0.025 MG per tablet Take 1 tablet by mouth every other day., Starting 03/09/2011, Until  Sat 03/08/12, Print    terbinafine (LAMISIL AT) 1 % cream Apply topically 2 (two) times daily.  , Until Discontinued, Historical Med        Hospital Procedures: Ct Head Wo Contrast  03/21/2011  *RADIOLOGY REPORT*  Clinical Data: Hyperglycemia, weakness and hypotension.  History of stroke.  CT HEAD WITHOUT CONTRAST  Technique:  Contiguous axial images were obtained from the base of the skull through the vertex without contrast.  Comparison: Head CT  06/26/2010.  MRI of the brain 08/28/2010.  Findings: There is subtle low density in the periventricular white matter on image 21, corresponding with the small infarct demonstrated on the prior MRI. There is no evidence of acute intracranial hemorrhage, mass lesion, brain edema or extra-axial fluid collection.  The ventricles and subarachnoid spaces are appropriately sized for age.  There is no CT evidence of acute cortical infarction.  There are prominent calcifications of the left vertebral and internal carotid arteries.  The visualized paranasal sinuses are clear. The calvarium is intact.  IMPRESSION: No acute intracranial findings.  Original Report Authenticated By: Gerrianne Scale, M.D.    History of Present Illness: This is a 50 year old white female who presented with lethargy a UTI and elevated sugars  Hospital Course: Patient was treated with aggressive hydration and IV antibiotics.  She defervesced fairly quickly.  Her white count came in normal.  She had no fluid overload.  Her blood sugars however remained extremely labile.  Unexpectedly she shot up over 490 mg/dL.  We adjusted her medications to help reduce lability.  Her orthostatic hypertension also flared up and required an adjustment in medications. She continued to have her chronic fatigue which is multi-factorial.  Her cardiac conditions remained stable during his hospitalization.  Workup for new neurologic lesions was negative.  Her anemia was stable.she was discharged home in improved condition. sHe is to finish out a course of ciprofloxacin  Day of Discharge Exam BP 125/76  Pulse 66  Temp(Src) 98.3 F (36.8 C) (Oral)  Resp 18  Ht 5\' 5"  (1.651 m)  Wt 73 kg (160 lb 15 oz)  BMI 26.78 kg/m2  SpO2 97%  LMP 03/13/2011  Physical Exam: General appearance: alert nontoxic Eyes: no scleral icterus,no lid lag or exophthalmus Throat: oropharynx moist without erythema Resp: clear to auscultation bilaterally Cardio: regular rate and  rhythm and S1, S2 normal systolic murmur is present GI: soft, non-tender; bowel sounds normal; no masses,  no organomegaly Extremities: no clubbing, cyanosis or edema Neuro: Awake alert maintaining well no tremor.. Gait is bit unsteady slight weakness of the right hand  Discharge Labs:  [facility] 04/06/11 1141  NA 141  K 4.9  CL 105  CO2 28  GLUCOSE 193*  BUN 28*  CREATININE 1.5*  CALCIUM 9.1  MG --  PHOS --   CBC    Component Value Date/Time   WBC 8.3 03/24/2011 0700   RBC 4.41 03/24/2011 0700   HGB 13.5 03/24/2011 0700   HCT 40.3 03/24/2011 0700   PLT 256 03/24/2011 0700   MCV 91.4 03/24/2011 0700   MCH 30.6 03/24/2011 0700   MCHC 33.5 03/24/2011 0700   RDW 13.6 03/24/2011 0700   LYMPHSABS 2.5 03/24/2011 0700   MONOABS 0.6 03/24/2011 0700   EOSABS 0.4 03/24/2011 0700   BASOSABS 0.0 03/24/2011 0700      Lab Results  Component Value Date   INR 1.09 03/27/2010    woman system and will with a 1  Discharge instructions: Discharge Orders  Future Appointments: Provider: Department: Dept Phone: Center:   07/02/2011 11:30 AM Marca Ancona, MD [facility] [phone]     Future Orders Please Complete By Expires   Diet - low sodium heart healthy      Increase activity slowly         Disposition : Home  Follow-up Appts: Follow-up with Dr.Brownie Nehme at [facility] in 2 weeks  Call for appointment.  Condition on Discharge: improved  Tests Needing Follow-up:  none  Signed: Carmichael Burdette ALAN 04/07/2011, 11:36 AM

## 2011-04-09 ENCOUNTER — Telehealth: Payer: Self-pay | Admitting: *Deleted

## 2011-04-09 NOTE — Telephone Encounter (Signed)
[name], [name] - 04/06/11 More Detail >>      Marca Ancona, MD        Sent: Sun April 08, 2011  3:39 PM    To: Catherine Krauss, RN          Result Note     Creatinine is up, decrease Lasix to 20.     Attached to     LIPID PANEL (Order# [id number]); BASIC METABOLIC PANEL (Order# [id number]); BRAIN NATRIURETIC PEPTIDE (Order# [id number]); DIGOXIN LEVEL (Order# [id number])         Lipid panel      Status: Final result   MyChart: Not Released   Next appt with me: None   Dx: Coronary atherosclerosis of native co...        Notes Recorded by Catherine Krauss, RN on 04/09/2011 at 11:31 AM Discussed with pt. She will decrease furosemide to 20mg  daily. Notes Recorded by Marca Ancona, MD on 04/08/2011 at 3:39 PM Creatinine is up, decrease Lasix to 20.        Range    3d ago    21yr ago        Cholesterol 0 - 200 mg/dL 846    95 ...      Comments:        ATP III Classification       Desirable:  < 200 mg/dL               Borderline High:  200 - 239 mg/dL          High:  > = 962 mg/dL         Triglycerides 0.0 - 149.0 mg/dL 952.8    413K      Comments:        Normal:  <150 mg/dLBorderline High:  150 - 199 mg/dL         HDL >44.01 mg/dL 02.72    53G       VLDL 0.0 - 40.0 mg/dL 64.4    03K       LDL Cholesterol 0 - 99 mg/dL 76    29 ...       Total CHOL/HDL Ratio  3    2.2      Comments:                       Men          Women1/2 Average Risk     3.4          3.3Average Risk          5.0          4.42X Average Risk          9.6          7.13X Average Risk          15.0          11.0                            Resulting Agency          Lab Flowsheet    Order Details View Encounter Lab and Collection Details Routing Result History      Specimen Collected: 04/06/11 11:41 AM Last Resulted: 04/06/11  4:39 PM        R=Reference range differs from displayed range          Basic metabolic panel  Status: Final result   MyChart: Not Released   Next appt with me: None   Dx: Coronary  atherosclerosis of native co...        Notes Recorded by Catherine Krauss, RN on 04/09/2011 at 11:31 AM Discussed with pt. She will decrease furosemide to 20mg  daily. Notes Recorded by Marca Ancona, MD on 04/08/2011 at 3:39 PM Creatinine is up, decrease Lasix to 20.        Range    3d ago (04/06/11)    2wk ago (03/24/11)    2wk ago (03/23/11)    2wk ago (03/23/11)    2wk ago (03/22/11)    2wk ago (03/22/11)    2wk ago (03/21/11)        Sodium 135 - 145 mEq/L 141    132 (L)      133 (L)   138   134 (L)   139       Potassium 3.5 - 5.1 mEq/L 4.9    4.4      4.6   4.4   4.6   4.4       Chloride 96 - 112 mEq/L 105    97      98   102   97   100       CO2 19 - 32 mEq/L 28    23      26   26   20   29        Glucose, Bld 70 - 99 mg/dL 528 (H)    413 (H)   244 (H)   470 (H)   346 (H)   586 (HH)CM   209 (H)       BUN 6 - 23 mg/dL 28 (H)    24 (H)      30 (H)   33 (H)   39 (H)   37 (H)       Creatinine, Ser 0.4 - 1.2 mg/dL 1.5 (H)    0.10 (H)R      1.31 (H)R   1.42 (H)R   1.61 (H)R   1.67 (H)R       Calcium 8.4 - 10.5 mg/dL 9.1    9.3      9.0   9.5   9.0   10.3       GFR >60.00 mL/min 38.64 (L)                         Resulting Agency                    Lab Flowsheet    Order Details View Encounter Lab and Collection Details Routing Result History      Specimen Collected: 04/06/11 11:41 AM Last Resulted: 04/06/11  4:39 PM        CM=Additional comments  R=Reference range differs from displayed range          Brain natriuretic peptide      Status: Final result   MyChart: Not Released   Next appt with me: None   Dx: Coronary atherosclerosis of native co...        Notes Recorded by Catherine Krauss, RN on 04/09/2011 at 11:31 AM Discussed with pt. She will decrease furosemide to 20mg  daily. Notes Recorded by Marca Ancona, MD on 04/08/2011 at 3:39 PM Creatinine is up, decrease Lasix to 20.        Range    3d ago (04/06/11)  23mo ago (01/17/11)    23mo ago (11/21/10)     62mo ago (06/26/10)    33mo ago (05/31/10)    33mo ago (05/30/10)    118mo ago (05/11/10)        Pro B Natriuretic peptide (BNP) 0.0 - 100.0 pg/mL 103.0 (H)    245.0 (H)   173.0 (H)   823.0 (H)   95.0   93.0   199.3 (H)      Resulting Agency                    Lab Flowsheet    Order Details View Encounter Lab and Collection Details Routing Result History      Specimen Collected: 04/06/11 11:41 AM Last Resulted: 04/06/11  4:39 PM            Digoxin level      Status: Final result   MyChart: Not Released   Next appt with me: None   Dx: Coronary atherosclerosis of native co...        Notes Recorded by Catherine Krauss, RN on 04/09/2011 at 11:31 AM Discussed with pt. She will decrease furosemide to 20mg  daily. Notes Recorded by Marca Ancona, MD on 04/08/2011 at 3:39 PM Creatinine is up, decrease Lasix to 20.        Range    3d ago    2wk ago    3wk ago    68mo ago    62mo ago        Digoxin Level 0.8 - 2.0 ng/mL 1.0    0.9   0.4 (L)   1.6   0.8      Resulting Agency                Lab Flowsheet  Result Narrative         Performed at:  [facility]                [address]                [place], [place] [address]         Order Details Tree surgeon Lab and Collection Details Routing Result History      Specimen Collected: 04/06/11 11:41 AM Last Resulted: 04/07/11  2:28 AM               [name], [name] - 04/06/11 More Detail >>      Marca Ancona, MD        Sent: Wynelle Link April 08, 2011  3:39 PM    To: Catherine Krauss, RN          Result Note     Creatinine is up, decrease Lasix to 20.     Attached to     LIPID PANEL (Order# [id number]); BASIC METABOLIC PANEL (Order# [id number]); BRAIN NATRIURETIC PEPTIDE (Order# [id number]); DIGOXIN LEVEL (Order# [id number])         Lipid panel      Status: Final result   MyChart: Not Released   Next appt with me: None   Dx: Coronary atherosclerosis of native co...        Notes Recorded by Catherine Krauss, RN on 04/09/2011 at 11:31 AM Discussed with pt. She will decrease furosemide to 20mg  daily. Notes Recorded by Marca Ancona, MD on 04/08/2011 at 3:39 PM Creatinine is up, decrease Lasix to 20.        Range    3d ago  35yr ago        Cholesterol 0 - 200 mg/dL 161    95 ...      Comments:        ATP III Classification       Desirable:  < 200 mg/dL               Borderline High:  200 - 239 mg/dL          High:  > = 096 mg/dL         Triglycerides 0.0 - 149.0 mg/dL 045.4    098J      Comments:        Normal:  <150 mg/dLBorderline High:  150 - 199 mg/dL         HDL >19.14 mg/dL 78.29    56O       VLDL 0.0 - 40.0 mg/dL 13.0    86V       LDL Cholesterol 0 - 99 mg/dL 76    29 ...       Total CHOL/HDL Ratio  3    2.2      Comments:                       Men          Women1/2 Average Risk     3.4          3.3Average Risk          5.0          4.42X Average Risk          9.6          7.13X Average Risk          15.0          11.0                            Resulting Agency          Lab Flowsheet    Order Details View Encounter Lab and Collection Details Routing Result History      Specimen Collected: 04/06/11 11:41 AM Last Resulted: 04/06/11  4:39 PM        R=Reference range differs from displayed range          Basic metabolic panel      Status: Final result   MyChart: Not Released   Next appt with me: None   Dx: Coronary atherosclerosis of native co...        Notes Recorded by Catherine Krauss, RN on 04/09/2011 at 11:31 AM Discussed with pt. She will decrease furosemide to 20mg  daily. Notes Recorded by Marca Ancona, MD on 04/08/2011 at 3:39 PM Creatinine is up, decrease Lasix to 20.        Range    3d ago (04/06/11)    2wk ago (03/24/11)    2wk ago (03/23/11)    2wk ago (03/23/11)    2wk ago (03/22/11)    2wk ago (03/22/11)    2wk ago (03/21/11)        Sodium 135 - 145 mEq/L 141    132 (L)      133 (L)   138   134 (L)   139       Potassium 3.5 - 5.1 mEq/L 4.9     4.4      4.6   4.4   4.6   4.4  Chloride 96 - 112 mEq/L 105    97      98   102   97   100       CO2 19 - 32 mEq/L 28    23      26   26   20   29        Glucose, Bld 70 - 99 mg/dL 161 (H)    096 (H)   045 (H)   470 (H)   346 (H)   586 (HH)CM   209 (H)       BUN 6 - 23 mg/dL 28 (H)    24 (H)      30 (H)   33 (H)   39 (H)   37 (H)       Creatinine, Ser 0.4 - 1.2 mg/dL 1.5 (H)    4.09 (H)R      1.31 (H)R   1.42 (H)R   1.61 (H)R   1.67 (H)R       Calcium 8.4 - 10.5 mg/dL 9.1    9.3      9.0   9.5   9.0   10.3       GFR >60.00 mL/min 38.64 (L)                         Resulting Agency                    Lab Flowsheet    Order Details View Encounter Lab and Collection Details Routing Result History      Specimen Collected: 04/06/11 11:41 AM Last Resulted: 04/06/11  4:39 PM        CM=Additional comments  R=Reference range differs from displayed range          Brain natriuretic peptide      Status: Final result   MyChart: Not Released   Next appt with me: None   Dx: Coronary atherosclerosis of native co...        Notes Recorded by Catherine Krauss, RN on 04/09/2011 at 11:31 AM Discussed with pt. She will decrease furosemide to 20mg  daily. Notes Recorded by Marca Ancona, MD on 04/08/2011 at 3:39 PM Creatinine is up, decrease Lasix to 20.        Range    3d ago (04/06/11)    13mo ago (01/17/11)    44mo ago (11/21/10)    74mo ago (06/26/10)    66mo ago (05/31/10)    66mo ago (05/30/10)    65mo ago (05/11/10)        Pro B Natriuretic peptide (BNP) 0.0 - 100.0 pg/mL 103.0 (H)    245.0 (H)   173.0 (H)   823.0 (H)   95.0   93.0   199.3 (H)      Resulting Agency                    Lab Flowsheet    Order Details View Encounter Lab and Collection Details Routing Result History      Specimen Collected: 04/06/11 11:41 AM Last Resulted: 04/06/11  4:39 PM            Digoxin level      Status: Final result   MyChart: Not Released   Next appt with me: None   Dx: Coronary  atherosclerosis of native co...        Notes Recorded by Catherine Krauss, RN on 04/09/2011 at  11:31 AM Discussed with pt. She will decrease furosemide to 20mg  daily. Notes Recorded by Marca Ancona, MD on 04/08/2011 at 3:39 PM Creatinine is up, decrease Lasix to 20.        Range    3d ago    2wk ago    3wk ago    35mo ago    4mo ago        Digoxin Level 0.8 - 2.0 ng/mL 1.0    0.9   0.4 (L)   1.6   0.8      Resulting Agency                Lab Flowsheet  Result Narrative         Performed at:  [facility]                [address], [address]                [place], [place] [address]         Order Details Tree surgeon Lab and Collection Details Routing Result History      Specimen Collected: 04/06/11 11:41 AM Last Resulted: 04/07/11  2:28 AM               [name], [name] - 04/06/11 More Detail >>      Marca Ancona, MD        Sent: Wynelle Link April 08, 2011  3:39 PM    To: Catherine Krauss, RN          Result Note     Creatinine is up, decrease Lasix to 20.     Attached to     LIPID PANEL (Order# [id number]); BASIC METABOLIC PANEL (Order# [id number]); BRAIN NATRIURETIC PEPTIDE (Order# [id number]); DIGOXIN LEVEL (Order# [id number])         Lipid panel      Status: Final result   MyChart: Not Released   Next appt with me: None   Dx: Coronary atherosclerosis of native co...        Notes Recorded by Catherine Krauss, RN on 04/09/2011 at 11:31 AM Discussed with pt. She will decrease furosemide to 20mg  daily. Notes Recorded by Marca Ancona, MD on 04/08/2011 at 3:39 PM Creatinine is up, decrease Lasix to 20.        Range    3d ago    84yr ago        Cholesterol 0 - 200 mg/dL 284    95 ...      Comments:        ATP III Classification       Desirable:  < 200 mg/dL               Borderline High:  200 - 239 mg/dL          High:  > = 132 mg/dL         Triglycerides 0.0 - 149.0 mg/dL 440.1    027O      Comments:        Normal:  <150 mg/dLBorderline High:  150 - 199  mg/dL         HDL >53.66 mg/dL 44.03    47Q       VLDL 0.0 - 40.0 mg/dL 25.9    56L       LDL Cholesterol 0 - 99 mg/dL 76    29 ...       Total CHOL/HDL Ratio  3    2.2  Comments:                       Men          Women1/2 Average Risk     3.4          3.3Average Risk          5.0          4.42X Average Risk          9.6          7.13X Average Risk          15.0          11.0                            Resulting Agency          Lab Flowsheet    Order Details View Encounter Lab and Collection Details Routing Result History      Specimen Collected: 04/06/11 11:41 AM Last Resulted: 04/06/11  4:39 PM        R=Reference range differs from displayed range          Basic metabolic panel      Status: Final result   MyChart: Not Released   Next appt with me: None   Dx: Coronary atherosclerosis of native co...        Notes Recorded by Catherine Krauss, RN on 04/09/2011 at 11:31 AM Discussed with pt. She will decrease furosemide to 20mg  daily. Notes Recorded by Marca Ancona, MD on 04/08/2011 at 3:39 PM Creatinine is up, decrease Lasix to 20.        Range    3d ago (04/06/11)    2wk ago (03/24/11)    2wk ago (03/23/11)    2wk ago (03/23/11)    2wk ago (03/22/11)    2wk ago (03/22/11)    2wk ago (03/21/11)        Sodium 135 - 145 mEq/L 141    132 (L)      133 (L)   138   134 (L)   139       Potassium 3.5 - 5.1 mEq/L 4.9    4.4      4.6   4.4   4.6   4.4       Chloride 96 - 112 mEq/L 105    97      98   102   97   100       CO2 19 - 32 mEq/L 28    23      26   26   20   29        Glucose, Bld 70 - 99 mg/dL 161 (H)    096 (H)   045 (H)   470 (H)   346 (H)   586 (HH)CM   209 (H)       BUN 6 - 23 mg/dL 28 (H)    24 (H)      30 (H)   33 (H)   39 (H)   37 (H)       Creatinine, Ser 0.4 - 1.2 mg/dL 1.5 (H)    4.09 (H)R      1.31 (H)R   1.42 (H)R   1.61 (H)R   1.67 (H)R       Calcium 8.4 - 10.5 mg/dL 9.1    9.3      9.0  9.5   9.0   10.3       GFR >60.00 mL/min 38.64 (L)                          Resulting Agency                    Lab Flowsheet    Order Details View Encounter Lab and Collection Details Routing Result History      Specimen Collected: 04/06/11 11:41 AM Last Resulted: 04/06/11  4:39 PM        CM=Additional comments  R=Reference range differs from displayed range          Brain natriuretic peptide      Status: Final result   MyChart: Not Released   Next appt with me: None   Dx: Coronary atherosclerosis of native co...        Notes Recorded by Catherine Krauss, RN on 04/09/2011 at 11:31 AM Discussed with pt. She will decrease furosemide to 20mg  daily. Notes Recorded by Marca Ancona, MD on 04/08/2011 at 3:39 PM Creatinine is up, decrease Lasix to 20.        Range    3d ago (04/06/11)    52mo ago (01/17/11)    51mo ago (11/21/10)    95mo ago (06/26/10)    3mo ago (05/31/10)    3mo ago (05/30/10)    58mo ago (05/11/10)        Pro B Natriuretic peptide (BNP) 0.0 - 100.0 pg/mL 103.0 (H)    245.0 (H)   173.0 (H)   823.0 (H)   95.0   93.0   199.3 (H)      Resulting Agency                    Lab Flowsheet    Order Details View Encounter Lab and Collection Details Routing Result History      Specimen Collected: 04/06/11 11:41 AM Last Resulted: 04/06/11  4:39 PM            Digoxin level      Status: Final result   MyChart: Not Released   Next appt with me: None   Dx: Coronary atherosclerosis of native co...        Notes Recorded by Catherine Krauss, RN on 04/09/2011 at 11:31 AM Discussed with pt. She will decrease furosemide to 20mg  daily. Notes Recorded by Marca Ancona, MD on 04/08/2011 at 3:39 PM Creatinine is up, decrease Lasix to 20.        Range    3d ago    2wk ago    3wk ago    26mo ago    95mo ago        Digoxin Level 0.8 - 2.0 ng/mL 1.0    0.9   0.4 (L)   1.6   0.8      Resulting Agency                Lab Flowsheet  Result Narrative         Performed at:  [facility]                [address]  [address]                [place], [place] [address]         Order Details Scientist, research (medical) Result History  Specimen Collected: 04/06/11 11:41 AM Last Resulted: 04/07/11  2:28 AM

## 2011-04-10 ENCOUNTER — Ambulatory Visit: Payer: Medicaid Other | Admitting: Cardiology

## 2011-07-02 ENCOUNTER — Ambulatory Visit (INDEPENDENT_AMBULATORY_CARE_PROVIDER_SITE_OTHER): Payer: Medicaid Other | Admitting: Cardiology

## 2011-07-02 ENCOUNTER — Encounter: Payer: Self-pay | Admitting: Cardiology

## 2011-07-02 DIAGNOSIS — I2510 Atherosclerotic heart disease of native coronary artery without angina pectoris: Secondary | ICD-10-CM

## 2011-07-02 DIAGNOSIS — I639 Cerebral infarction, unspecified: Secondary | ICD-10-CM

## 2011-07-02 DIAGNOSIS — I6350 Cerebral infarction due to unspecified occlusion or stenosis of unspecified cerebral artery: Secondary | ICD-10-CM

## 2011-07-02 DIAGNOSIS — E785 Hyperlipidemia, unspecified: Secondary | ICD-10-CM

## 2011-07-02 DIAGNOSIS — G4733 Obstructive sleep apnea (adult) (pediatric): Secondary | ICD-10-CM

## 2011-07-02 DIAGNOSIS — I5022 Chronic systolic (congestive) heart failure: Secondary | ICD-10-CM

## 2011-07-02 LAB — BASIC METABOLIC PANEL
BUN: 16 mg/dL (ref 6–23)
Chloride: 103 mEq/L (ref 96–112)
Creatinine, Ser: 1.1 mg/dL (ref 0.4–1.2)
Glucose, Bld: 243 mg/dL — ABNORMAL HIGH (ref 70–99)
Potassium: 4.2 mEq/L (ref 3.5–5.1)

## 2011-07-02 NOTE — Patient Instructions (Signed)
Your physician recommends that you have  lab work today--BMET/Digoxin level 414.01  428.22  Your physician recommends that you schedule a follow-up appointment in: 3 months with Dr Shirlee Latch.

## 2011-07-03 DIAGNOSIS — G4733 Obstructive sleep apnea (adult) (pediatric): Secondary | ICD-10-CM | POA: Insufficient documentation

## 2011-07-03 LAB — DIGOXIN LEVEL: Digoxin Level: 1 ng/mL (ref 0.8–2.0)

## 2011-07-03 NOTE — Progress Notes (Signed)
PCP: Dr. Evlyn Kanner  51 yo with history of Type I diabetes, CAD, and ischemic cardiomyopathy presents for cardiology followup.  She had a prolonged admission with DKA and systolic CHF in 12/11.  Cardiac enzymes were found to be elevated and left heart cath showed occluded LAD, which appeared chronic at the time of cath.  Echo showed peri-apical akinesis with EF 30%.  Management was complicated by orthostatic hypotension causing syncope.  This was likely due to combination of low cardiac output and diabetic autonomic neuropathy.  This limited titration of cardiac medications initially. Repeat echo (1/12) showed EF 40-45% (improved).  She was readmitted with DKA in 3/12, and cardiac enzymes were noted to be quite elevated with troponin peaking at 24 and CKMB at 69.  Repeat LHC was done, actually showing no significant difference from the 12/11 study.    In 5/12, she noted weakness one day in her right leg and arm.  She had a head MRI done, showing a small acute left parietal infarction.  This stroke occurred while on ASA and Plavix.  She has improved but her left arm still has some weakness. Carotid dopplers showed no significant carotid stenosis.  I did a limited echo to look for LV thrombus, and this was not seen.  3 week event monitor showed no atrial fibrillation.    She was readmitted in 11/12 with fatigue/lethargy and was found to have a hyperglycemic nonketotic state, UTI, ARF, and mild elevation of troponin to 0.7.  BP was low and cardiac medications were cut back.  It was suspected that her mild troponin elevation probably represented demand ischemia.   Lately, she has been doing well.  No chest pain.  She is walking without exertional dyspnea.  No lightheadedness or dizziness, SBP is 118 today.  Weight is up about 9 lbs since last appointment. She continues to have some right-sided weakness from her stroke.  No falls.  She reports being very sleepy during the day. Her mother tells her that she snores at  night.   Labs (12/11): K 3.7, creatinine 0.88, BNP 612, LDL 29, HDL 44 Labs (1/12): digoxin 1.0, BNP 199, K 4.4, creatinine 0.9 Labs (5/12): digoxin 0.4 Labs (6/12): K 4.2, creatinine 0.99 Labs (7/12): K 6.2, creatinine 1.3 (KCl and spironolactone held).  Labs (7/12), repeat: K 4.8, creatinine 1.2, BNP 173 Labs (11/12): K 4.6, creatinine 1.6 => 1.3, TnI 0.7 Labs (12/12): K 4.9, creatinine 1.5, LDL 76, HDL 55, BNP 103, digoxin 1.0  Allergies (verified):  1)  Reglan 2)  Erythromycin  Past Medical History: 1. Hypertension: However, after MI had orthostatic hypotension.  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 CAD: NSTEMI 12/11.  Cardiac catheterization (12/11) showing LAD totalled in the midportion, diagonal 1 70% stenosed (small 2-mm vessel), circumflex nonobstructive, OM1 40%, OM2 30%, RCA small and no significant disease.  LAD occlusion was calcified and appeared chronic at the time of catheterization so no intervention.  Patient readmitted with DKA in 3/12, troponin found to be 24 and CKMB 69.  Repeat LHC showed no change from 12/11 study.  5. Ischemic cardiomyopathy: Echo (12/11) with EF 30% and periapical akinesis, no significant MR, RV looked normal.  QRS is not wide on ECG.  Echo (1/12): EF 40-45%, severe hypokinesis of the anteroseptal wall, apical inferior wall, inferoseptal wall, and apex.  Hyperkalemia with spironolactone.  6. Orthostatic hypotension: Syncopal episode in 12/11 likely due to this.  Probably from combination of depressed cardiac output and diabetic autonomic  neuropathy.  7. Type 1 insulin-dependent diabetes. History of DKA.  8. Hyperlipidemia.  9. Diabetic retinopathy.  10. Gastroparesis.  11. Diabetic nephropathy  12. Depression.  13. 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  Carotid dopplers (7/12) with no significant disease.  Limited echo in 7/12 did not show an LV apical thrombus.  3-week event monitor in 7/12 did not show any runs of atrial  fibrillation.   Family History: No FH of Colon Cancer: Family History of Pancreatic Cancer: Father deceased 40 No premature CAD  Social History: Divorced, 1 boy unemployed, lives in [place] Patient has never smoked.  Alcohol Use - no Daily Caffeine Use 1 cup/day Illicit Drug Use - no   Current Outpatient Prescriptions  Medication Sig Dispense Refill  . ALPRAZolam (XANAX) 0.5 MG tablet Take 0.5 mg by mouth daily as needed.       [name] aspirin EC 81 MG tablet Take 81 mg by mouth at bedtime.       . Calcium Carbonate-Vit D-Min 600-200 MG-UNIT TABS Take 1 tablet by mouth daily.        . carvedilol (COREG) 6.25 MG tablet Take 1 tablet (6.25 mg total) by mouth 2 (two) times daily.  60 tablet  5  . citalopram (CELEXA) 20 MG tablet Take 20 mg by mouth daily.        . clopidogrel (PLAVIX) 75 MG tablet Take 1 tablet (75 mg total) by mouth daily.  30 tablet  5  . digoxin (LANOXIN) 0.125 MG tablet Take 125 mcg by mouth every other day.       . enalapril (VASOTEC) 2.5 MG tablet 1/2 of a 5mg  tablet bid  30 tablet  11  . ferrous sulfate 325 (65 FE) MG tablet Take 325 mg by mouth daily with breakfast.        . furosemide (LASIX) 20 MG tablet 1/2 of a 40mg  tablet --20mg  daily  30 tablet  11  . insulin aspart (NOVOLOG) 100 UNIT/ML injection Inject 2-8 Units into the skin 3 (three) times daily before meals. Sliding scale      . insulin glargine (LANTUS) 100 UNIT/ML injection Inject 10-14 Units into the skin as directed. Patient states 14 units every morning and 10 to 12 units every evening      . Multiple Vitamins-Iron (QC DAILY MULTIVITAMINS/IRON) TABS Take 1 tablet by mouth daily.        . rosuvastatin (CRESTOR) 40 MG tablet Take 40 mg by mouth at bedtime as needed.   30 tablet  11  . DISCONTD: enalapril (VASOTEC) 5 MG tablet Take 1 tablet (5 mg total) by mouth daily.  60 tablet  6    BP 118/68  Pulse 76  Ht 5\' 4"  (1.626 m)  Wt 165 lb (74.844 kg)  BMI 28.32 kg/m2 General:  Well developed,  well nourished, in no acute distress. Head:  Annular skin lesion on forehead.  Neck:  Neck supple, no JVD. No masses, thyromegaly or abnormal cervical nodes. Lungs:  Clear bilaterally to auscultation and percussion. Heart:  Non-displaced PMI, chest non-tender; regular rate and rhythm, S1, S2 without murmurs, rubs or gallops. Carotid upstroke normal, no bruit. Pedals normal pulses. No edema.  Abdomen:  Bowel sounds positive; abdomen soft and non-tender without masses, organomegaly, or hernias noted. No hepatosplenomegaly. Extremities:  No clubbing or cyanosis. Neurologic:  Alert and oriented x 3. Right arm is weak.  Psych:  Normal affect.

## 2011-07-03 NOTE — Assessment & Plan Note (Signed)
LDL was at goal when last checked.  Continue current statin dose.

## 2011-07-03 NOTE — Assessment & Plan Note (Signed)
Occurred while on aspirin and Plavix.  Some residual right arm weakness.  Carotid dopplers showed no significant disease and limited echo to look for LV apical thrombus was unremarkable.  3-week monitor did not show atrial fibrillation.  

## 2011-07-03 NOTE — Assessment & Plan Note (Addendum)
She appears euvolemic with NYHA class II symptoms.  EF 40-45% on last echo.  Off spironolactone as she developed hyperkalemia while taking this medication.  Her Coreg and enalapril doses have been lowered due to hypotension.  I will continue her on the current doses of Coreg, enalapril, digoxin, and lasix.  Her digoxin level was decreased due to a slightly elevated level, so will repeat digoxin level.  Will also check BMET.  I think that her weight gain is more due to poor diet and lack of exercise rather than fluid retention.  She needs to try to walk more.

## 2011-07-03 NOTE — Assessment & Plan Note (Signed)
Daytime sleepiness, snores at night.  Needs sleep study.  Will wait until she gets her Medicaid back.

## 2011-07-03 NOTE — Assessment & Plan Note (Signed)
Stable coronary disease with occluded mid-LAD.  She had quite elevated cardiac enzymes when she was hospitalized with DKA in 3/12, but her coronary disease was unchanged.  I am unsure what caused the enzyme elevation.  Perhaps she had a hypoperfusion event early in the course of DKA with demand cardiac ischemia.  She had a mild elevation of TnI during her most recent hospitalization in 11/12 that I think was due to demand ischemia (hypotension, hyperglycemia, ARF). - Continue ASA 81, continue Plavix.   - Continue Coreg, enalapril, statin.

## 2011-07-04 ENCOUNTER — Telehealth: Payer: Self-pay | Admitting: *Deleted

## 2011-07-04 NOTE — Telephone Encounter (Signed)
Notes Recorded by Jacqlyn Krauss, RN on 07/04/2011 at 3:09 PM Discussed with pt. She is taking digoxin 0.125mg  every other day. She will decrease digoxin to 1/2 tab digoxin every other day (1/2 of 0.125mg ). ------  Notes Recorded by Jacqlyn Krauss, RN on 07/04/2011 at 12:21 PM LMTCB ------  Notes Recorded by Laurey Morale, MD on 07/04/2011 at 11:46 AM Labs ok. Make sure she is taking digoxin every other day. If she is, have her take 1/2 tab digoxin every other day (1/2 of 0.125).

## 2011-10-12 ENCOUNTER — Encounter: Payer: Self-pay | Admitting: Cardiology

## 2011-10-12 ENCOUNTER — Ambulatory Visit (INDEPENDENT_AMBULATORY_CARE_PROVIDER_SITE_OTHER): Payer: Self-pay | Admitting: Cardiology

## 2011-10-12 ENCOUNTER — Telehealth: Payer: Self-pay | Admitting: Cardiology

## 2011-10-12 VITALS — BP 114/82 | HR 72 | Ht 64.0 in | Wt 169.0 lb

## 2011-10-12 DIAGNOSIS — G4733 Obstructive sleep apnea (adult) (pediatric): Secondary | ICD-10-CM

## 2011-10-12 DIAGNOSIS — I5022 Chronic systolic (congestive) heart failure: Secondary | ICD-10-CM

## 2011-10-12 DIAGNOSIS — I6350 Cerebral infarction due to unspecified occlusion or stenosis of unspecified cerebral artery: Secondary | ICD-10-CM

## 2011-10-12 DIAGNOSIS — I639 Cerebral infarction, unspecified: Secondary | ICD-10-CM

## 2011-10-12 DIAGNOSIS — I2510 Atherosclerotic heart disease of native coronary artery without angina pectoris: Secondary | ICD-10-CM

## 2011-10-12 DIAGNOSIS — E785 Hyperlipidemia, unspecified: Secondary | ICD-10-CM

## 2011-10-12 MED ORDER — ENALAPRIL MALEATE 2.5 MG PO TABS: 2.5000 mg | ORAL_TABLET | Freq: Two times a day (BID) | ORAL | Status: DC

## 2011-10-12 NOTE — Patient Instructions (Addendum)
Increase enalapril to 2.5mg  twice a day.  Stop lasix(furosemide).  Your physician recommends that you have a FASTING lipid profile /BMET/Digoxin level in 1 week. You have the order. Please fax the results to [phone] Dr Shirlee Latch.  Your physician wants you to follow-up in: 4 months with Dr Shirlee Latch. ((October 2013). You will receive a reminder letter in the mail two months in advance. If you don't receive a letter, please call our office to schedule the follow-up appointment.

## 2011-10-12 NOTE — Telephone Encounter (Signed)
Spoke with pt and pt's mother. Pt's mother states pt has currently been taking enalapril 2.5mg  twice a day. Several months ago pt was on 5mg  twice a day. Pt went to ED around the first of this year due to dizziness. Pt was told to decrease enalapril to current dose of 2.5mg  twice a day because her BP was too low. Pt's mother states pt has done fine on enalapril 2.5mg  twice a day and pt is hesitant to increase dose because of past problems with dizziness and BP. I will forward to Dr Shirlee Latch for review.

## 2011-10-12 NOTE — Telephone Encounter (Signed)
Twice a day is fine for now. That is what I wanted her on.

## 2011-10-12 NOTE — Telephone Encounter (Signed)
Pt.notified

## 2011-10-12 NOTE — Telephone Encounter (Signed)
New msg Pt was here today and she has some more questions

## 2011-10-14 NOTE — Assessment & Plan Note (Signed)
Stable coronary disease with occluded mid-LAD.   - Continue ASA 81, continue Plavix.   - Continue Coreg, enalapril, statin.

## 2011-10-14 NOTE — Assessment & Plan Note (Signed)
Occurred while on aspirin and Plavix.  Some residual right arm weakness.  Carotid dopplers showed no significant disease and limited echo to look for LV apical thrombus was unremarkable.  3-week monitor did not show atrial fibrillation.  

## 2011-10-14 NOTE — Assessment & Plan Note (Signed)
Check lipids/LFTs with goal LDL < 70.  

## 2011-10-14 NOTE — Progress Notes (Signed)
Patient ID: [name], female   DOB: 31-Aug-1960, 51 y.o.   MRN: [id number] PCP: Dr. Evlyn Kanner  51 yo with history of Type I diabetes, CAD, and ischemic cardiomyopathy presents for cardiology followup.  She had a prolonged admission with DKA and systolic CHF in 12/11.  Cardiac enzymes were found to be elevated and left heart cath showed occluded LAD, which appeared chronic at the time of cath.  Echo showed peri-apical akinesis with EF 30%.  Management was complicated by orthostatic hypotension causing syncope.  This was likely due to combination of low cardiac output and diabetic autonomic neuropathy.  This limited titration of cardiac medications initially. Repeat echo (1/12) showed EF 40-45% (improved).  She was readmitted with DKA in 3/12, and cardiac enzymes were noted to be quite elevated with troponin peaking at 24 and CKMB at 69.  Repeat LHC was done, actually showing no significant difference from the 12/11 study.    In 5/12, she noted weakness one day in her right leg and arm.  She had a head MRI done, showing a small acute left parietal infarction.  This stroke occurred while on ASA and Plavix.  She has improved but her left arm still has some weakness. Carotid dopplers showed no significant carotid stenosis.  I did a limited echo to look for LV thrombus, and this was not seen.  3 week event monitor showed no atrial fibrillation.  Last echo in 7/12 showed EF 40-45%.  She was readmitted in 11/12 with fatigue/lethargy and was found to have a hyperglycemic nonketotic state, UTI, ARF, and mild elevation of troponin to 0.7.  BP was low and cardiac medications were cut back.  It was suspected that her mild troponin elevation probably represented demand ischemia.   Lately, she has been doing well.  No chest pain.  Blood glucose has been better controlled but she has had some episodes of hypoglycemia.  She is walking without exertional dyspnea.  No lightheadedness or dizziness, SBP is 114 today.  Weight is down  4 lbs since last appointment. She continues to have some right-sided weakness from her stroke.  No falls.  She reports being very sleepy during the day. Her mother tells her that she snores at night.  We have not set her up for a sleep study yet because she will not be covered by insurance.  She does have occasional problems with both bladder and bowel incontinence.   Labs (12/11): K 3.7, creatinine 0.88, BNP 612, LDL 29, HDL 44 Labs (1/12): digoxin 1.0, BNP 199, K 4.4, creatinine 0.9 Labs (5/12): digoxin 0.4 Labs (6/12): K 4.2, creatinine 0.99 Labs (7/12): K 6.2, creatinine 1.3 (KCl and spironolactone held).  Labs (7/12), repeat: K 4.8, creatinine 1.2, BNP 173 Labs (11/12): K 4.6, creatinine 1.6 => 1.3, TnI 0.7 Labs (12/12): K 4.9, creatinine 1.5, LDL 76, HDL 55, BNP 103, digoxin 1.0 Labs (3/13): K 4.2, creatinine 1.1, digoxin 1.0  ECG: NSR, poor anterior R wave progression, lateral TWIs  Allergies (verified):  1)  Reglan 2)  Erythromycin  Past Medical History: 1. Hypertension: However, after MI had orthostatic hypotension.  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 CAD: NSTEMI 12/11.  Cardiac catheterization (12/11) showing LAD totalled in the midportion, diagonal 1 70% stenosed (small 2-mm vessel), circumflex nonobstructive, OM1 40%, OM2 30%, RCA small and no significant disease.  LAD occlusion was calcified and appeared chronic at the time of catheterization so no intervention.  Patient readmitted with DKA in 3/12, troponin  found to be 24 and CKMB 69.  Repeat LHC showed no change from 12/11 study.  5. Ischemic cardiomyopathy: Echo (12/11) with EF 30% and periapical akinesis, no significant MR, RV looked normal.  QRS is not wide on ECG.  Echo (1/12): EF 40-45%, severe hypokinesis of the anteroseptal wall, apical inferior wall, inferoseptal wall, and apex.  Hyperkalemia with spironolactone.  Echo 7/12 with EF 40-45%.  6. Orthostatic hypotension: Syncopal episode in 12/11 likely  due to this.  Probably from combination of depressed cardiac output and diabetic autonomic neuropathy.  7. Type 1 insulin-dependent diabetes. History of DKA.  8. Hyperlipidemia.  9. Diabetic retinopathy.  10. Gastroparesis.  11. Diabetic nephropathy  12. Depression.  13. 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  Carotid dopplers (7/12) with no significant disease.  Limited echo in 7/12 did not show an LV apical thrombus.  3-week event monitor in 7/12 did not show any runs of atrial fibrillation.   Family History: No FH of Colon Cancer: Family History of Pancreatic Cancer: Father deceased 77 No premature CAD  Social History: Divorced, 1 boy unemployed, lives in [place] Patient has never smoked.  Alcohol Use - no Daily Caffeine Use 1 cup/day Illicit Drug Use - no  ROS: All systems reviewed and negative except as per HPI.    Current Outpatient Prescriptions  Medication Sig Dispense Refill  . ALPRAZolam (XANAX) 0.5 MG tablet Take 0.5 mg by mouth daily as needed.       [name] aspirin EC 81 MG tablet Take 81 mg by mouth at bedtime.       . Calcium Carbonate-Vit D-Min 600-200 MG-UNIT TABS Take 1 tablet by mouth daily.        . carvedilol (COREG) 6.25 MG tablet Take 1 tablet (6.25 mg total) by mouth 2 (two) times daily.  60 tablet  5  . citalopram (CELEXA) 20 MG tablet Take 20 mg by mouth daily.        . clopidogrel (PLAVIX) 75 MG tablet Take 1 tablet (75 mg total) by mouth daily.  30 tablet  5  . digoxin (LANOXIN) 0.125 MG tablet 1/2 every other day      . ferrous sulfate 325 (65 FE) MG tablet Take 325 mg by mouth daily with breakfast.        . insulin aspart (NOVOLOG) 100 UNIT/ML injection Inject 2-8 Units into the skin 3 (three) times daily before meals. Sliding scale      . insulin glargine (LANTUS) 100 UNIT/ML injection Inject 10-14 Units into the skin as directed. Patient states 14 units every morning and 10 to 12 units every evening      . Multiple Vitamins-Iron (QC DAILY  MULTIVITAMINS/IRON) TABS Take 1 tablet by mouth daily.        . rosuvastatin (CRESTOR) 40 MG tablet Take 40 mg by mouth at bedtime as needed.   30 tablet  11  . enalapril (VASOTEC) 2.5 MG tablet Pt takes 1/2 of a 5mg  tablet twice a day  60 tablet  6  . DISCONTD: enalapril (VASOTEC) 5 MG tablet Take 1 tablet (5 mg total) by mouth daily.  60 tablet  6    BP 114/82  Pulse 72  Ht 5\' 4"  (1.626 m)  Wt 76.658 kg (169 lb)  BMI 29.01 kg/m2 General:  Well developed, well nourished, in no acute distress. Head:  Annular skin lesion on forehead.  Neck:  Neck supple, no JVD. No masses, thyromegaly or abnormal cervical nodes. Lungs:  Clear bilaterally to  auscultation and percussion. Heart:  Non-displaced PMI, chest non-tender; regular rate and rhythm, S1, S2 without murmurs, rubs or gallops. Carotid upstroke normal, no bruit. Pedals normal pulses. No edema.  Abdomen:  Bowel sounds positive; abdomen soft and non-tender without masses, organomegaly, or hernias noted. No hepatosplenomegaly. Extremities:  No clubbing or cyanosis. Neurologic:  Alert and oriented x 3. Right arm is weak.  Psych:  Normal affect.

## 2011-10-14 NOTE — Assessment & Plan Note (Signed)
She appears euvolemic with NYHA class II symptoms.  EF 40-45% on last echo.  Off spironolactone as she developed hyperkalemia while taking this medication.  I will continue her on the current doses of Coreg, digoxin, and lasix.   - Her digoxin level was decreased due to a slightly elevated level, so will repeat digoxin level.   - Will also check BMET.   - Increase enalapril to 2.5 mg bid. Continue Coreg at same dose.  - Occasional urinary incontinence.  I will have her stop Lasix and see how she does off it.  She does not appear volume overloaded.  She will need to watch her sodium intake carefully.  - Concern that Coreg could potentially be contributing to bowel incontinence.  I would like her to continue this med for now.

## 2011-10-14 NOTE — Assessment & Plan Note (Signed)
Will wait on sleep study until she gets Medicaid.

## 2011-10-17 ENCOUNTER — Ambulatory Visit (INDEPENDENT_AMBULATORY_CARE_PROVIDER_SITE_OTHER): Payer: Self-pay | Admitting: *Deleted

## 2011-10-17 DIAGNOSIS — I5022 Chronic systolic (congestive) heart failure: Secondary | ICD-10-CM

## 2011-10-17 DIAGNOSIS — I2510 Atherosclerotic heart disease of native coronary artery without angina pectoris: Secondary | ICD-10-CM

## 2011-10-17 LAB — LIPID PANEL
Cholesterol: 125 mg/dL (ref 0–200)
LDL Cholesterol: 57 mg/dL (ref 0–99)
Total CHOL/HDL Ratio: 2
VLDL: 17 mg/dL (ref 0.0–40.0)

## 2011-10-17 LAB — BASIC METABOLIC PANEL
BUN: 19 mg/dL (ref 6–23)
CO2: 30 mEq/L (ref 19–32)
Calcium: 9.4 mg/dL (ref 8.4–10.5)
Chloride: 102 mEq/L (ref 96–112)
Creatinine, Ser: 1.2 mg/dL (ref 0.4–1.2)

## 2011-10-19 ENCOUNTER — Other Ambulatory Visit: Payer: Self-pay | Admitting: *Deleted

## 2011-10-19 DIAGNOSIS — E875 Hyperkalemia: Secondary | ICD-10-CM

## 2011-10-29 ENCOUNTER — Telehealth: Payer: Self-pay | Admitting: Cardiology

## 2011-10-29 NOTE — Telephone Encounter (Signed)
Just K and creatinine

## 2011-10-29 NOTE — Telephone Encounter (Signed)
Pt wants to know if she can just have a potassium done on Friday?  She states she does not have any insurance to pay for it and is concerned about the cost.

## 2011-10-29 NOTE — Telephone Encounter (Signed)
Please return call to patient at [phone] regarding medical care.

## 2011-10-30 NOTE — Telephone Encounter (Signed)
K $16 / Cr $13--BMET $24. I spoke with pt. She is aware it will be less expensive to have BMET rather than K and Cr separately.

## 2011-11-02 ENCOUNTER — Other Ambulatory Visit (INDEPENDENT_AMBULATORY_CARE_PROVIDER_SITE_OTHER): Payer: Self-pay

## 2011-11-02 ENCOUNTER — Other Ambulatory Visit: Payer: Self-pay | Admitting: *Deleted

## 2011-11-02 DIAGNOSIS — E875 Hyperkalemia: Secondary | ICD-10-CM

## 2011-11-02 LAB — BASIC METABOLIC PANEL
BUN: 26 mg/dL — ABNORMAL HIGH (ref 6–23)
Chloride: 105 mEq/L (ref 96–112)
GFR: 45.89 mL/min — ABNORMAL LOW (ref >60.00)
Potassium: 5.4 mEq/L — ABNORMAL HIGH (ref 3.5–5.1)
Sodium: 139 mEq/L (ref 135–145)

## 2011-11-12 ENCOUNTER — Other Ambulatory Visit (INDEPENDENT_AMBULATORY_CARE_PROVIDER_SITE_OTHER): Payer: Self-pay

## 2011-11-12 DIAGNOSIS — E875 Hyperkalemia: Secondary | ICD-10-CM

## 2011-11-12 LAB — BASIC METABOLIC PANEL
CO2: 27 mEq/L (ref 19–32)
Calcium: 9.6 mg/dL (ref 8.4–10.5)
Chloride: 103 mEq/L (ref 96–112)
Glucose, Bld: 328 mg/dL — ABNORMAL HIGH (ref 70–99)
Potassium: 4.9 mEq/L (ref 3.5–5.1)
Sodium: 139 mEq/L (ref 135–145)

## 2011-11-14 ENCOUNTER — Telehealth: Payer: Self-pay | Admitting: Cardiology

## 2011-11-14 NOTE — Telephone Encounter (Signed)
Spoke with pt about recent BMET results.

## 2011-11-14 NOTE — Telephone Encounter (Signed)
New msg Pt was calling about test results 

## 2012-01-07 ENCOUNTER — Telehealth: Payer: Self-pay | Admitting: Cardiology

## 2012-01-07 DIAGNOSIS — I5022 Chronic systolic (congestive) heart failure: Secondary | ICD-10-CM

## 2012-01-07 NOTE — Telephone Encounter (Signed)
Phone seems to be out of order---fast busy.

## 2012-01-07 NOTE — Telephone Encounter (Signed)
Spoke with pt . Pt states her lasix was stopped in June. For the last week pt has some swelling in her feet and ankles. It is not so bad that she cannot get her shoes on.  Pt states she sleeps with her feet elevated and the edema is improved in the mornings but not completely gone. Pt denies increase in SOB. Pt states she feels her weight is stable. She states she is avoiding sodium. I will forward to Dr Shirlee Latch for review and recommendations.

## 2012-01-07 NOTE — Telephone Encounter (Signed)
New problem:   C/o  Both feet are swollen.  Dr. Shirlee Latch took patient off lasix.

## 2012-01-07 NOTE — Telephone Encounter (Signed)
Fast busy #.

## 2012-01-07 NOTE — Telephone Encounter (Signed)
Spoke with pt

## 2012-01-07 NOTE — Telephone Encounter (Signed)
Spoke with pt. Pt will restart lasix 20mg  daily. She will return for [facility] 01/22/12.

## 2012-01-07 NOTE — Telephone Encounter (Signed)
She may go back on Lasix 20 mg daily with BMET in 2 weeks.  No K b/c her K has tended to run on the high side.

## 2012-01-22 ENCOUNTER — Other Ambulatory Visit (INDEPENDENT_AMBULATORY_CARE_PROVIDER_SITE_OTHER): Payer: Self-pay

## 2012-01-22 DIAGNOSIS — I5022 Chronic systolic (congestive) heart failure: Secondary | ICD-10-CM

## 2012-01-22 LAB — BASIC METABOLIC PANEL
BUN: 21 mg/dL (ref 6–23)
CO2: 27 mEq/L (ref 19–32)
Chloride: 102 mEq/L (ref 96–112)
Creatinine, Ser: 1.2 mg/dL (ref 0.4–1.2)
Potassium: 4.3 mEq/L (ref 3.5–5.1)

## 2012-02-08 ENCOUNTER — Encounter: Payer: Self-pay | Admitting: Cardiology

## 2012-02-08 ENCOUNTER — Ambulatory Visit (INDEPENDENT_AMBULATORY_CARE_PROVIDER_SITE_OTHER): Payer: Self-pay | Admitting: Cardiology

## 2012-02-08 VITALS — BP 123/78 | HR 79 | Ht 64.0 in | Wt 177.0 lb

## 2012-02-08 DIAGNOSIS — I2510 Atherosclerotic heart disease of native coronary artery without angina pectoris: Secondary | ICD-10-CM

## 2012-02-08 DIAGNOSIS — I639 Cerebral infarction, unspecified: Secondary | ICD-10-CM

## 2012-02-08 DIAGNOSIS — E785 Hyperlipidemia, unspecified: Secondary | ICD-10-CM

## 2012-02-08 DIAGNOSIS — G4733 Obstructive sleep apnea (adult) (pediatric): Secondary | ICD-10-CM

## 2012-02-08 DIAGNOSIS — I6350 Cerebral infarction due to unspecified occlusion or stenosis of unspecified cerebral artery: Secondary | ICD-10-CM

## 2012-02-08 DIAGNOSIS — I5022 Chronic systolic (congestive) heart failure: Secondary | ICD-10-CM

## 2012-02-08 MED ORDER — FUROSEMIDE 40 MG PO TABS: 40.0000 mg | ORAL_TABLET | Freq: Every day | ORAL | Status: DC

## 2012-02-08 NOTE — Patient Instructions (Addendum)
Increase lasix(furosemide) to 40mg  daily.  You can take two 20mg  tablets daily at the same time.   Your physician recommends that you return for lab work in: 2 weeks--BMET.    Your physician recommends that you schedule a follow-up appointment in: 2 months with Dr Shirlee Latch.

## 2012-02-09 NOTE — Progress Notes (Signed)
Patient ID: [name], female   DOB: 1961-04-22, 51 y.o.   MRN: [id number] PCP: Dr. Evlyn Kanner  51 yo with history of Type I diabetes, CAD, and ischemic cardiomyopathy presents for cardiology followup.  She had a prolonged admission with DKA and systolic CHF in 12/11.  Cardiac enzymes were found to be elevated and left heart cath showed occluded LAD, which appeared chronic at the time of cath.  Echo showed peri-apical akinesis with EF 30%.  Management was complicated by orthostatic hypotension causing syncope.  This was likely due to combination of low cardiac output and diabetic autonomic neuropathy.  This limited titration of cardiac medications initially. Repeat echo (1/12) showed EF 40-45% (improved).  She was readmitted with DKA in 3/12, and cardiac enzymes were noted to be quite elevated with troponin peaking at 24 and CKMB at 69.  Repeat LHC was done, actually showing no significant difference from the 12/11 study.    In 5/12, she noted weakness one day in her right leg and arm.  She had a head MRI done, showing a small acute left parietal infarction.  This stroke occurred while on ASA and Plavix.  She has improved but her left arm still has some weakness. Carotid dopplers showed no significant carotid stenosis.  I did a limited echo to look for LV thrombus, and this was not seen.  3 week event monitor showed no atrial fibrillation.  Last echo in 7/12 showed EF 40-45%.  [name] has been stable lately.  I had to cut back her enalapril to 2.5 mg daily due to hyperkalemia.  Weight is up by 8 lbs since last appointment.  She denies significant exertional dyspnea but tires easily and does not have much stamina.  She does walk up and down her driveway every day for exercise (says it is about 1/4 mile long).  No chest pain.  She reports being very sleepy during the day. Her mother tells her that she snores at night.  We have not set her up for a sleep study yet because she does not have insurance coverage right now.     Labs (12/11): K 3.7, creatinine 0.88, BNP 612, LDL 29, HDL 44 Labs (1/12): digoxin 1.0, BNP 199, K 4.4, creatinine 0.9 Labs (5/12): digoxin 0.4 Labs (6/12): K 4.2, creatinine 0.99 Labs (7/12): K 6.2, creatinine 1.3 (KCl and spironolactone held).  Labs (7/12), repeat: K 4.8, creatinine 1.2, BNP 173 Labs (11/12): K 4.6, creatinine 1.6 => 1.3, TnI 0.7 Labs (12/12): K 4.9, creatinine 1.5, LDL 76, HDL 55, BNP 103, digoxin 1.0 Labs (3/13): K 4.2, creatinine 1.1, digoxin 1.0 Labs (6/13): LDL 57, HDL 51, digoxin 0.3  Allergies (verified):  1)  Reglan 2)  Erythromycin  Past Medical History: 1. Hypertension: However, after MI had orthostatic hypotension.  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 CAD: NSTEMI 12/11.  Cardiac catheterization (12/11) showing LAD totalled in the midportion, diagonal 1 70% stenosed (small 2-mm vessel), circumflex nonobstructive, OM1 40%, OM2 30%, RCA small and no significant disease.  LAD occlusion was calcified and appeared chronic at the time of catheterization so no intervention.  Patient readmitted with DKA in 3/12, troponin found to be 24 and CKMB 69.  Repeat LHC showed no change from 12/11 study.  5. Ischemic cardiomyopathy: Echo (12/11) with EF 30% and periapical akinesis, no significant MR, RV looked normal.  QRS is not wide on ECG.  Echo (1/12): EF 40-45%, severe hypokinesis of the anteroseptal wall, apical inferior wall,  inferoseptal wall, and apex.  Hyperkalemia with spironolactone and with > 2.5 mg daily enalapril.  Echo 7/12 with EF 40-45%.  6. Orthostatic hypotension: Syncopal episode in 12/11 likely due to this.  Probably from combination of depressed cardiac output and diabetic autonomic neuropathy.  7. Type 1 insulin-dependent diabetes. History of DKA.  8. Hyperlipidemia.  9. Diabetic retinopathy.  10. Gastroparesis.  11. Diabetic nephropathy  12. Depression.  13. 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  Carotid dopplers (7/12) with no  significant disease.  Limited echo in 7/12 did not show an LV apical thrombus.  3-week event monitor in 7/12 did not show any runs of atrial fibrillation.   Family History: No FH of Colon Cancer: Family History of Pancreatic Cancer: Father deceased 59 No premature CAD  Social History: Divorced, 1 boy unemployed, lives in [place] Patient has never smoked.  Alcohol Use - no Daily Caffeine Use 1 cup/day Illicit Drug Use - no  ROS: All systems reviewed and negative except as per HPI.    Current Outpatient Prescriptions  Medication Sig Dispense Refill  . ALPRAZolam (XANAX) 0.5 MG tablet Take 0.5 mg by mouth daily as needed.       [name] aspirin EC 81 MG tablet Take 81 mg by mouth at bedtime.       . Calcium Carbonate-Vit D-Min 600-200 MG-UNIT TABS Take 1 tablet by mouth daily.        . carvedilol (COREG) 6.25 MG tablet Take 1 tablet (6.25 mg total) by mouth 2 (two) times daily.  60 tablet  5  . citalopram (CELEXA) 20 MG tablet Take 20 mg by mouth daily.        . clopidogrel (PLAVIX) 75 MG tablet Take 1 tablet (75 mg total) by mouth daily.  30 tablet  5  . digoxin (LANOXIN) 0.125 MG tablet 1/2 every other day      . enalapril (VASOTEC) 2.5 MG tablet Take 2.5 mg by mouth daily.       . ferrous sulfate 325 (65 FE) MG tablet Take 325 mg by mouth daily with breakfast.        . insulin aspart (NOVOLOG) 100 UNIT/ML injection Inject 2-8 Units into the skin 3 (three) times daily before meals. Sliding scale      . insulin glargine (LANTUS) 100 UNIT/ML injection Inject 10-14 Units into the skin as directed. Patient states 14 units every morning and 10 to 12 units every evening      . Multiple Vitamins-Iron (QC DAILY MULTIVITAMINS/IRON) TABS Take 1 tablet by mouth daily.        . rosuvastatin (CRESTOR) 40 MG tablet Take 40 mg by mouth at bedtime as needed.   30 tablet  11  . furosemide (LASIX) 40 MG tablet Take 1 tablet (40 mg total) by mouth daily.  30 tablet  3    BP 123/78  Pulse 79  Ht 5'  4" (1.626 m)  Wt 177 lb (80.287 kg)  BMI 30.38 kg/m2 General:  Well developed, well nourished, in no acute distress. Head:  Annular skin lesion on forehead.  Neck:  Neck thick, JVP difficult. No masses, thyromegaly or abnormal cervical nodes. Lungs:  Clear bilaterally to auscultation and percussion. Heart:  Non-displaced PMI, chest non-tender; regular rate and rhythm, S1, S2 without murmurs, rubs or gallops. Carotid upstroke normal, no bruit. Pedals normal pulses. 1+ edema 1/4 up lower legs bilaterally.   Abdomen:  Bowel sounds positive; abdomen soft and non-tender without masses, organomegaly, or hernias noted. No  hepatosplenomegaly. Extremities:  No clubbing or cyanosis. Neurologic:  Alert and oriented x 3. Right arm is weak.  Psych:  Normal affect.  Assessment/Plan:  CHRONIC SYSTOLIC HEART FAILURE  [name] appears mildly volume overloaded today.  I started her back on Lasix 20 mg daily a few weeks ago.  She had been on 40 mg daily in the past.  EF 40-45% on last echo. Off spironolactone as she developed hyperkalemia while taking this medication and enalapril has been cut down to 2.5 mg daily also due to hyperkalemia. I will continue her on the current doses of Coreg, digoxin, and enalapril.  Last  digoxin level was ok.  I will increase her Lasix to 40 mg daily with BMET in 2 wks.  CORONARY ATHEROSCLEROSIS NATIVE CORONARY ARTERY  Stable coronary disease with occluded mid-LAD.  Continue current meds.  CVA (cerebral infarction)   Occurred while on aspirin and Plavix. Some residual right arm weakness. Carotid dopplers showed no significant disease and limited echo to look for LV apical thrombus was unremarkable. 3-week monitor did not show atrial fibrillation.  HYPERLIPIDEMIA  LDL at goal (< 70) in 6/13.   OSA (obstructive sleep apnea)  Will wait on sleep study until she gets Medicaid.   Marca Ancona 02/09/2012 11:23 AM

## 2012-02-15 ENCOUNTER — Ambulatory Visit: Payer: Self-pay | Admitting: Cardiology

## 2012-02-19 ENCOUNTER — Other Ambulatory Visit (INDEPENDENT_AMBULATORY_CARE_PROVIDER_SITE_OTHER): Payer: Self-pay

## 2012-02-19 DIAGNOSIS — I5022 Chronic systolic (congestive) heart failure: Secondary | ICD-10-CM

## 2012-02-19 DIAGNOSIS — I2510 Atherosclerotic heart disease of native coronary artery without angina pectoris: Secondary | ICD-10-CM

## 2012-02-19 LAB — BASIC METABOLIC PANEL
BUN: 28 mg/dL — ABNORMAL HIGH (ref 6–23)
CO2: 30 mEq/L (ref 19–32)
Calcium: 9.1 mg/dL (ref 8.4–10.5)
GFR: 41.03 mL/min — ABNORMAL LOW (ref >60.00)
Glucose, Bld: 162 mg/dL — ABNORMAL HIGH (ref 70–99)
Sodium: 138 mEq/L (ref 135–145)

## 2012-02-19 IMAGING — CR DG ABDOMEN ACUTE W/ 1V CHEST
5 series · 5 of 5 positions shown · non-contrast
Comparison: None.

CLINICAL DATA: Nausea, vomiting, weakness

ACUTE ABDOMEN SERIES (ABDOMEN 2 VIEW & CHEST 1 VIEW)

[w abdomen decub (1 of 2)]
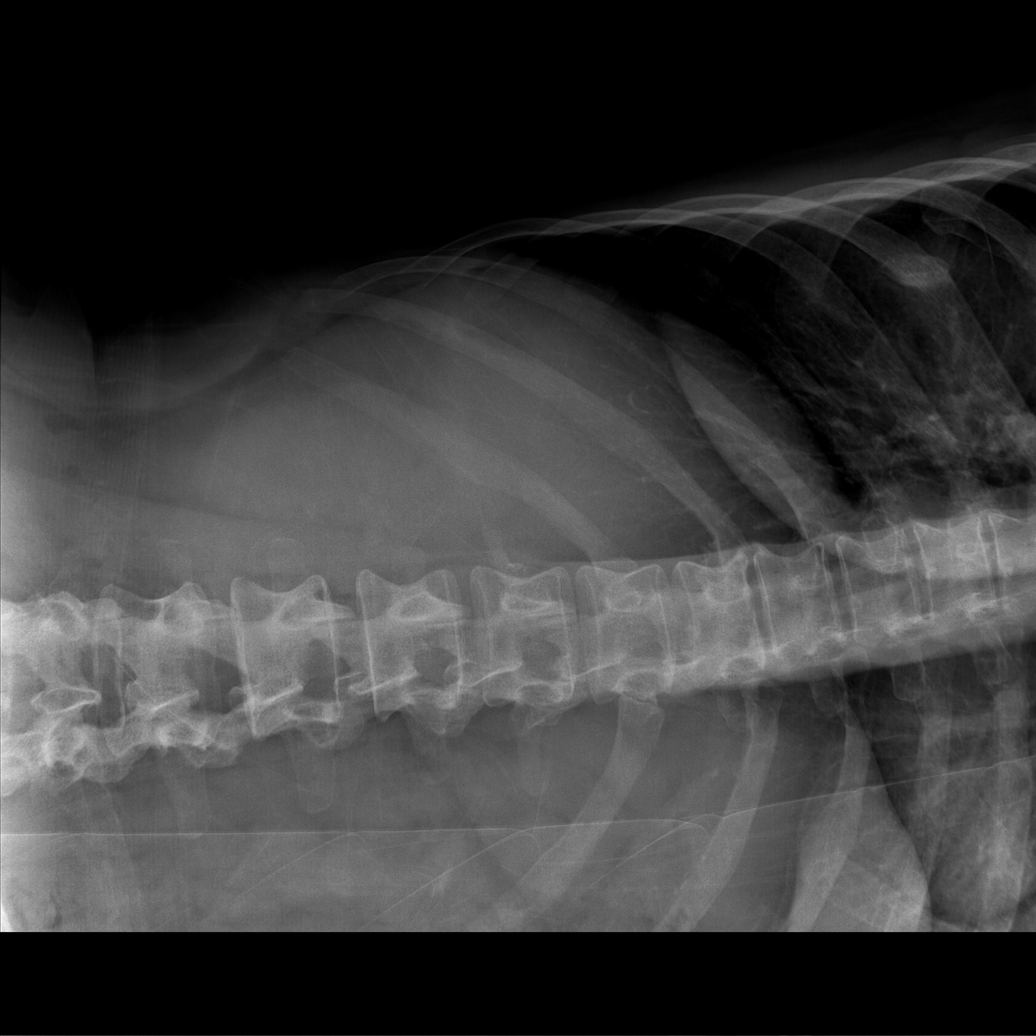

[w abdomen decub (2 of 2)]
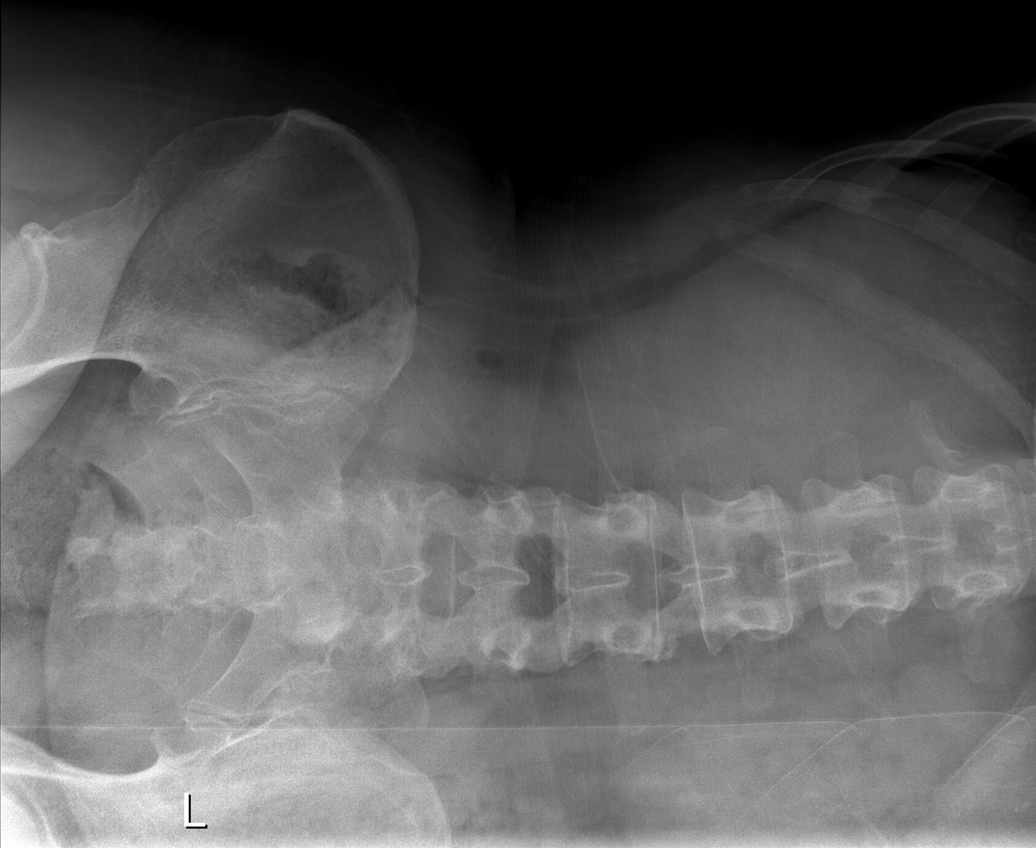

[view not recorded (1 of 3)]
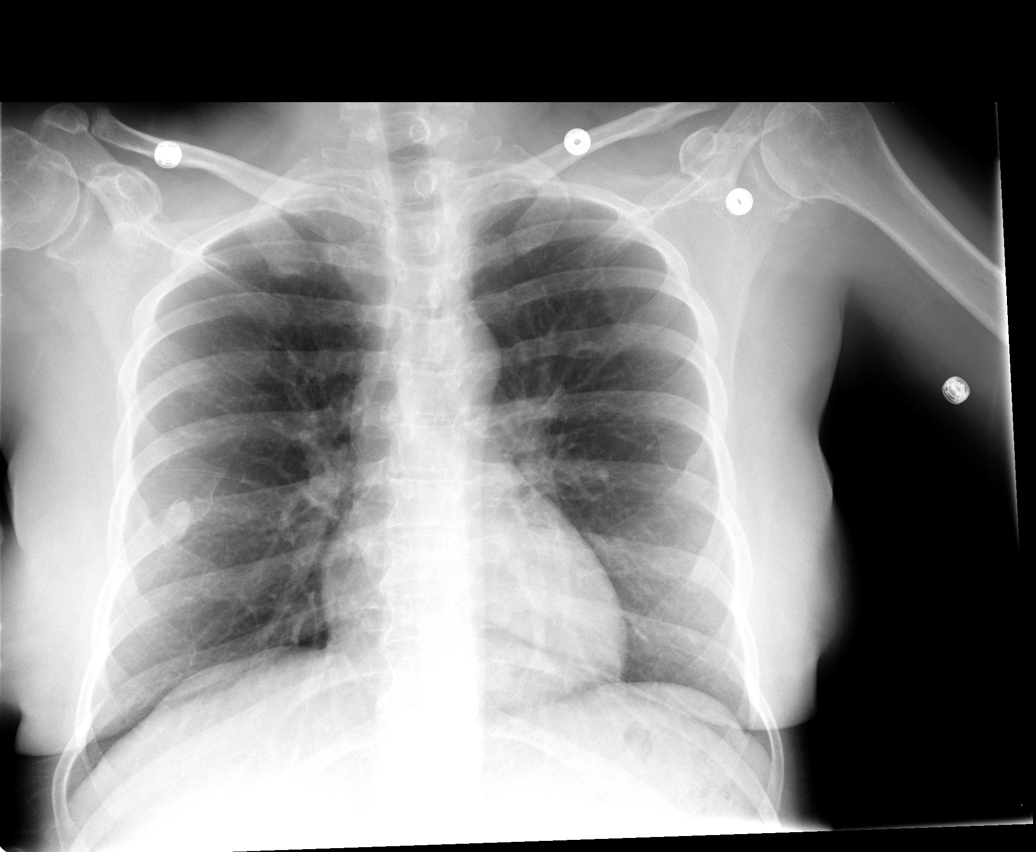

[view not recorded (2 of 3)]
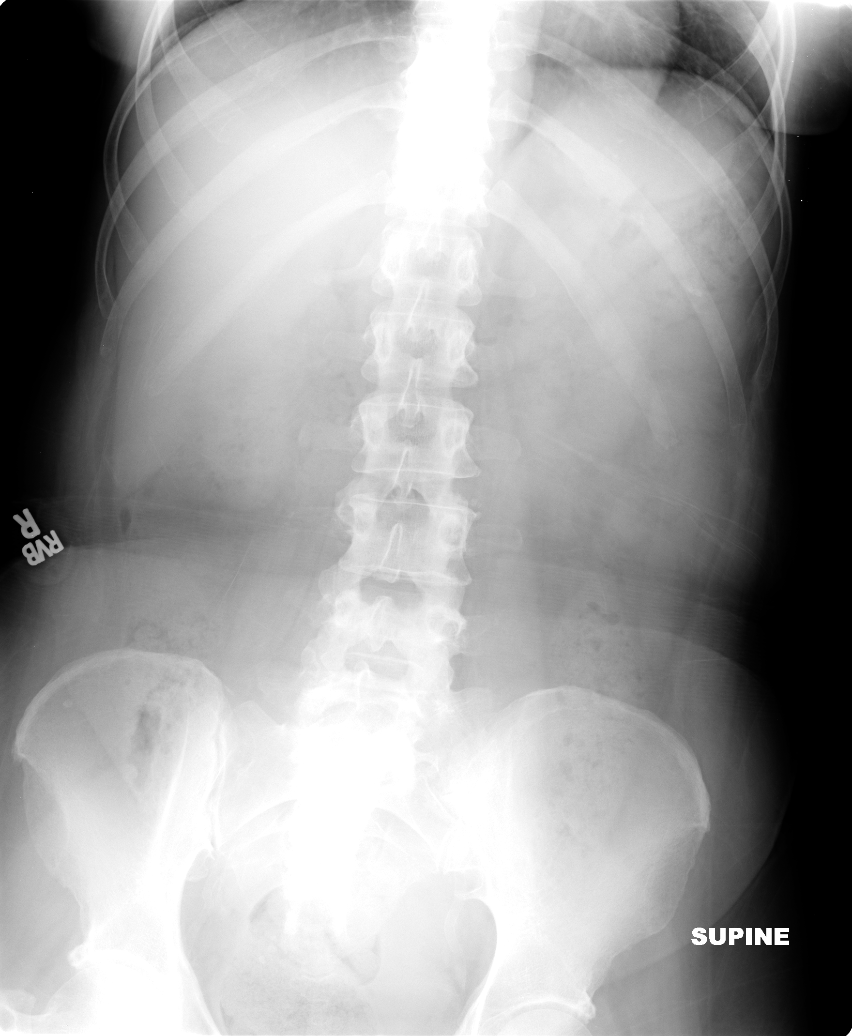

[view not recorded (3 of 3)]
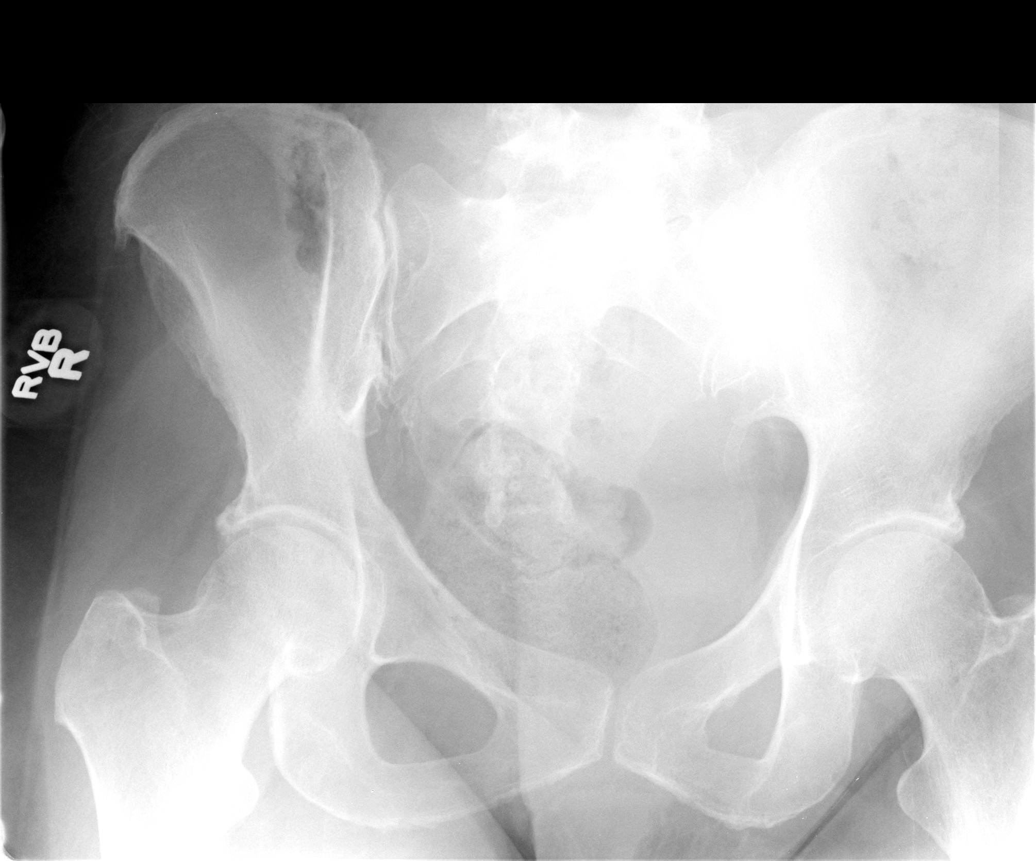

[5 of 5 positions shown; findings below may reference images not displayed]

FINDINGS: No active infiltrate or effusion is seen.  Mediastinal
contours are normal.  The heart is within normal limits in size.
Old healed right seventh rib fracture is noted.

Supine and left lateral decubitus films of the abdomen show no
bowel obstruction.  Some feces is noted throughout the colon.  No
free air is noted.  The  abdomen is relatively gasless. No opaque
calculi are seen.
IMPRESSION: 1.  No active lung disease.
2.  No bowel obstruction.  No free air.  Moderate amount of feces
in the colon.

## 2012-05-02 ENCOUNTER — Ambulatory Visit (INDEPENDENT_AMBULATORY_CARE_PROVIDER_SITE_OTHER): Payer: Self-pay | Admitting: Cardiology

## 2012-05-02 ENCOUNTER — Telehealth: Payer: Self-pay | Admitting: Cardiology

## 2012-05-02 ENCOUNTER — Ambulatory Visit (INDEPENDENT_AMBULATORY_CARE_PROVIDER_SITE_OTHER)
Admission: RE | Admit: 2012-05-02 | Discharge: 2012-05-02 | Disposition: A | Discharge status: Home / Self Care | Payer: Self-pay | Source: Ambulatory Visit | Attending: Cardiology | Admitting: Cardiology

## 2012-05-02 ENCOUNTER — Encounter: Payer: Self-pay | Admitting: Cardiology

## 2012-05-02 VITALS — BP 96/58 | HR 71 | Ht 64.0 in | Wt 167.0 lb

## 2012-05-02 DIAGNOSIS — I5022 Chronic systolic (congestive) heart failure: Secondary | ICD-10-CM

## 2012-05-02 DIAGNOSIS — I639 Cerebral infarction, unspecified: Secondary | ICD-10-CM

## 2012-05-02 DIAGNOSIS — R0609 Other forms of dyspnea: Secondary | ICD-10-CM

## 2012-05-02 DIAGNOSIS — I2510 Atherosclerotic heart disease of native coronary artery without angina pectoris: Secondary | ICD-10-CM

## 2012-05-02 DIAGNOSIS — R5383 Other fatigue: Secondary | ICD-10-CM

## 2012-05-02 DIAGNOSIS — I6350 Cerebral infarction due to unspecified occlusion or stenosis of unspecified cerebral artery: Secondary | ICD-10-CM

## 2012-05-02 DIAGNOSIS — R5381 Other malaise: Secondary | ICD-10-CM

## 2012-05-02 LAB — BASIC METABOLIC PANEL
BUN: 18 mg/dL (ref 6–23)
CO2: 26 mEq/L (ref 19–32)
Calcium: 9.6 mg/dL (ref 8.4–10.5)
Chloride: 102 mEq/L (ref 96–112)
Creatinine, Ser: 1.5 mg/dL — ABNORMAL HIGH (ref 0.4–1.2)
GFR: 40.02 mL/min — ABNORMAL LOW (ref >60.00)
Glucose, Bld: 255 mg/dL — ABNORMAL HIGH (ref 70–99)
Potassium: 4.6 mEq/L (ref 3.5–5.1)
Sodium: 137 mEq/L (ref 135–145)

## 2012-05-02 LAB — CBC WITH DIFFERENTIAL/PLATELET
Basophils Absolute: 0 10*3/uL (ref 0.0–0.1)
Basophils Relative: 0.5 % (ref 0.0–3.0)
Eosinophils Absolute: 0.2 10*3/uL (ref 0.0–0.7)
Eosinophils Relative: 2.1 % (ref 0.0–5.0)
HCT: 35.6 % — ABNORMAL LOW (ref 36.0–46.0)
Hemoglobin: 11.7 g/dL — ABNORMAL LOW (ref 12.0–15.0)
Lymphocytes Relative: 28.6 % (ref 12.0–46.0)
Lymphs Abs: 2.2 10*3/uL (ref 0.7–4.0)
MCHC: 33 g/dL (ref 30.0–36.0)
MCV: 89 fl (ref 78.0–100.0)
Monocytes Absolute: 0.5 10*3/uL (ref 0.1–1.0)
Monocytes Relative: 7.1 % (ref 3.0–12.0)
Neutro Abs: 4.7 10*3/uL (ref 1.4–7.7)
Neutrophils Relative %: 61.7 % (ref 43.0–77.0)
Platelets: 366 10*3/uL (ref 150.0–400.0)
RBC: 4 Mil/uL (ref 3.87–5.11)
RDW: 13.3 % (ref 11.5–14.6)
WBC: 7.7 10*3/uL (ref 4.5–10.5)

## 2012-05-02 LAB — BRAIN NATRIURETIC PEPTIDE: Pro B Natriuretic peptide (BNP): 253 pg/mL — ABNORMAL HIGH (ref 0.0–100.0)

## 2012-05-02 MED ORDER — FUROSEMIDE 20 MG PO TABS: 20.0000 mg | ORAL_TABLET | Freq: Every day | ORAL | Status: DC

## 2012-05-02 NOTE — Patient Instructions (Addendum)
Increase lasix(furosemide) to 20mg  daily.   Your physician recommends that you have lab work today--Digoxin level /BMET/BNP/CBCd.  A chest x-ray takes a picture of the organs and structures inside the chest, including the heart, lungs, and blood vessels. This test can show several things, including, whether the heart is enlarges; whether fluid is building up in the lungs; and whether pacemaker / defibrillator leads are still in place. TODAY  Your physician has requested that you have an echocardiogram. Echocardiography is a painless test that uses sound waves to create images of your heart. It provides your doctor with information about the size and shape of your heart and how well your heart's chambers and valves are working. This procedure takes approximately one hour. There are no restrictions for this procedure.  Your physician recommends that you schedule a follow-up appointment in: 1 month with Dr Shirlee Latch.

## 2012-05-02 NOTE — Telephone Encounter (Signed)
Spoke with pt

## 2012-05-02 NOTE — Telephone Encounter (Signed)
New Problem:    Patient called in wanting to speak with you about her medication.  Please call back.

## 2012-05-03 LAB — DIGOXIN LEVEL: Digoxin Level: 0.2 ng/mL — ABNORMAL LOW (ref 0.8–2.0)

## 2012-05-04 DIAGNOSIS — R5383 Other fatigue: Secondary | ICD-10-CM | POA: Insufficient documentation

## 2012-05-04 NOTE — Progress Notes (Signed)
Patient ID: [name], female   DOB: 02/24/1961, 52 y.o.   MRN: [id number] PCP: Dr. Evlyn Kanner  52 yo with history of Type I diabetes, CAD, and ischemic cardiomyopathy presents for cardiology followup.  She had a prolonged admission with DKA and systolic CHF in 12/11.  Cardiac enzymes were found to be elevated and left heart cath showed occluded LAD, which appeared chronic at the time of cath.  Echo showed peri-apical akinesis with EF 30%.  Management was complicated by orthostatic hypotension causing syncope.  This was likely due to combination of low cardiac output and diabetic autonomic neuropathy.  This limited titration of cardiac medications initially. Repeat echo (1/12) showed EF 40-45% (improved).  She was readmitted with DKA in 3/12, and cardiac enzymes were noted to be quite elevated with troponin peaking at 24 and CKMB at 69.  Repeat LHC was done, actually showing no significant difference from the 12/11 study.    In 5/12, she noted weakness one day in her right leg and arm.  She had a head MRI done, showing a small acute left parietal infarction.  This stroke occurred while on ASA and Plavix.  She has improved but her left arm still has some weakness. Carotid dopplers showed no significant carotid stenosis.  I did a limited echo to look for LV thrombus, and this was not seen.  3 week event monitor showed no atrial fibrillation.  Last echo in 7/12 showed EF 40-45%.  In the last few months, her cardiac meds have been cut back somewhat due to hyperkalemia.  For the last 3 wks, she reports profound fatigue.  She has also been having significant vaginal bleeding and plans to see her OB-gyn.  She has actually lost 10 lbs since last appointment here in the office.  No chest pain.  No fever/chills.  She has stable dyspnea after walking 50-100 feet.  She recently had TSH checked at PCP's office, she says it was normal.    I did a CXR today, no PNA was seen.  No significant edema.   ECG: NSR, right axis  deviation, nonspecific ST changes.   Labs (12/11): K 3.7, creatinine 0.88, BNP 612, LDL 29, HDL 44 Labs (1/12): digoxin 1.0, BNP 199, K 4.4, creatinine 0.9 Labs (5/12): digoxin 0.4 Labs (6/12): K 4.2, creatinine 0.99 Labs (7/12): K 6.2, creatinine 1.3 (KCl and spironolactone held).  Labs (7/12), repeat: K 4.8, creatinine 1.2, BNP 173 Labs (11/12): K 4.6, creatinine 1.6 => 1.3, TnI 0.7 Labs (12/12): K 4.9, creatinine 1.5, LDL 76, HDL 55, BNP 103, digoxin 1.0 Labs (3/13): K 4.2, creatinine 1.1, digoxin 1.0 Labs (6/13): LDL 57, HDL 51, digoxin 0.3 Labs (11/91): K 4.4, creatinine 1.4  Allergies (verified):  1)  Reglan 2)  Erythromycin  Past Medical History: 1. Hypertension: However, after MI had orthostatic hypotension.  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 CAD: NSTEMI 12/11.  Cardiac catheterization (12/11) showing LAD totalled in the midportion, diagonal 1 70% stenosed (small 2-mm vessel), circumflex nonobstructive, OM1 40%, OM2 30%, RCA small and no significant disease.  LAD occlusion was calcified and appeared chronic at the time of catheterization so no intervention.  Patient readmitted with DKA in 3/12, troponin found to be 24 and CKMB 69.  Repeat LHC showed no change from 12/11 study.  5. Ischemic cardiomyopathy: Echo (12/11) with EF 30% and periapical akinesis, no significant MR, RV looked normal.  QRS is not wide on ECG.  Echo (1/12): EF 40-45%, severe hypokinesis  of the anteroseptal wall, apical inferior wall, inferoseptal wall, and apex.  Hyperkalemia with spironolactone and with > 2.5 mg daily enalapril.  Echo 7/12 with EF 40-45%.  6. Orthostatic hypotension: Syncopal episode in 12/11 likely due to this.  Probably from combination of depressed cardiac output and diabetic autonomic neuropathy.  7. Type 1 insulin-dependent diabetes. History of DKA.  8. Hyperlipidemia.  9. Diabetic retinopathy.  10. Gastroparesis.  11. Diabetic nephropathy  12. Depression.  13. Left  parietal infarction (5/12): small.  Carotid dopplers (7/12) with no significant disease.  Limited echo in 7/12 did not show an LV apical thrombus.  3-week event monitor in 7/12 did not show any runs of atrial fibrillation.   Family History: No FH of Colon Cancer: Family History of Pancreatic Cancer: Father deceased 79 No premature CAD  Social History: Divorced, 1 boy unemployed, lives in [place] Patient has never smoked.  Alcohol Use - no Daily Caffeine Use 1 cup/day Illicit Drug Use - no  ROS: All systems reviewed and negative except as per HPI.    Current Outpatient Prescriptions  Medication Sig Dispense Refill  . ALPRAZolam (XANAX) 0.5 MG tablet Take 0.5 mg by mouth daily as needed.       [name] aspirin EC 81 MG tablet Take 81 mg by mouth at bedtime.       [name] CALCIUM PO Take 500 mg by mouth daily.      . citalopram (CELEXA) 20 MG tablet Take 20 mg by mouth daily.        . clopidogrel (PLAVIX) 75 MG tablet Take 1 tablet (75 mg total) by mouth daily.  30 tablet  5  . digoxin (LANOXIN) 0.125 MG tablet 1/2 every other day      . enalapril (VASOTEC) 2.5 MG tablet Take 1.25 mg by mouth daily.       . ferrous sulfate 325 (65 FE) MG tablet Take 325 mg by mouth 2 (two) times daily.       . insulin aspart (NOVOLOG) 100 UNIT/ML injection Inject 2-3 Units into the skin 3 (three) times daily before meals. Sliding scale      . insulin glargine (LANTUS) 100 UNIT/ML injection Inject 8 Units into the skin as directed.       . Multiple Vitamins-Iron (QC DAILY MULTIVITAMINS/IRON) TABS Take 1 tablet by mouth daily.        . rosuvastatin (CRESTOR) 40 MG tablet Take 40 mg by mouth at bedtime as needed.   30 tablet  11  . Vitamin D, Ergocalciferol, (DRISDOL) 50000 UNITS CAPS Take 50,000 Units by mouth daily.      . carvedilol (COREG) 6.25 MG tablet Take 1 tablet (6.25 mg total) by mouth 2 (two) times daily.  60 tablet  5  . furosemide (LASIX) 40 MG tablet 20 mg alternating with 40mg         BP 96/58   Pulse 71  Ht 5\' 4"  (1.626 m)  Wt 167 lb (75.751 kg)  BMI 28.67 kg/m2 General:  Well developed, well nourished, in no acute distress. Head:  Annular skin lesion on forehead.  Neck:  Neck thick, JVP difficult. No masses, thyromegaly or abnormal cervical nodes. Lungs:  Crackles right base Heart:  Non-displaced PMI, chest non-tender; regular rate and rhythm, S1, S2 without murmurs, rubs or gallops. Carotid upstroke normal, no bruit. Pedals normal pulses. No edema.   Abdomen:  Bowel sounds positive; abdomen soft and non-tender without masses, organomegaly, or hernias noted. No hepatosplenomegaly. Extremities:  No clubbing  or cyanosis. Neurologic:  Alert and oriented x 3. Right arm is weak.  Psych:  Normal affect.  Assessment/Plan:  CHRONIC SYSTOLIC HEART FAILURE  EF 40-45% on last echo in 2012. Off spironolactone as she developed hyperkalemia while taking this medication and enalapril has been cut down also due to hyperkalemia. I will continue her on the current doses of Coreg, digoxin, and enalapril.  Last  digoxin level was ok.  She has lost weight but now feels profound fatigue and possibly increased dyspnea.  She does not have PNA on CXR.  Her BP is soft but this has been her general range chronically.  Exam is difficult for volume. - I will check a BNP and increase Lasix gently to 20 daily alternating with 40 daily.  - needs BMET and digoxin level as well.  - I will get a repeat echo to look for any changes.  Fatigue Making changes as noted above.  Will also get a CBC to assess for significant anemia in the setting of vaginal bleeding.  She is going to see her ob-gyn.  CORONARY ATHEROSCLEROSIS NATIVE CORONARY ARTERY  Stable coronary disease with occluded mid-LAD.  Continue current meds.  CVA (cerebral infarction)   Occurred while on aspirin and Plavix. Some residual right arm weakness. Carotid dopplers showed no significant disease and limited echo to look for LV apical thrombus was  unremarkable. 3-week monitor did not show atrial fibrillation.  HYPERLIPIDEMIA  Continue Crestor.  Will get lipids next appointment.  OSA (obstructive sleep apnea)  Will wait on sleep study until she gets Medicaid.   Marca Ancona 05/04/2012 11:40 AM

## 2012-05-12 ENCOUNTER — Other Ambulatory Visit (INDEPENDENT_AMBULATORY_CARE_PROVIDER_SITE_OTHER): Payer: Self-pay

## 2012-05-12 DIAGNOSIS — I5022 Chronic systolic (congestive) heart failure: Secondary | ICD-10-CM

## 2012-05-12 LAB — BRAIN NATRIURETIC PEPTIDE: Pro B Natriuretic peptide (BNP): 108 pg/mL — ABNORMAL HIGH (ref 0.0–100.0)

## 2012-05-12 LAB — BASIC METABOLIC PANEL
BUN: 18 mg/dL (ref 6–23)
Calcium: 8.5 mg/dL (ref 8.4–10.5)
Creatinine, Ser: 1.3 mg/dL — ABNORMAL HIGH (ref 0.4–1.2)
GFR: 46.63 mL/min — ABNORMAL LOW (ref >60.00)

## 2012-05-16 ENCOUNTER — Other Ambulatory Visit: Payer: Self-pay

## 2012-06-03 ENCOUNTER — Telehealth: Payer: Self-pay | Admitting: Cardiology

## 2012-06-03 NOTE — Telephone Encounter (Signed)
New Problem    Pt would like clarification in regards to her appt and if it is necessary for her to come in at all.

## 2012-06-03 NOTE — Telephone Encounter (Signed)
Advised pt of appt date and time.

## 2012-06-06 ENCOUNTER — Ambulatory Visit: Payer: Self-pay | Admitting: Cardiology

## 2012-07-15 ENCOUNTER — Ambulatory Visit: Payer: Self-pay | Admitting: Cardiology

## 2012-07-16 ENCOUNTER — Ambulatory Visit: Payer: Self-pay | Admitting: Cardiology

## 2012-07-25 ENCOUNTER — Encounter: Payer: Self-pay | Admitting: Cardiology

## 2012-07-25 ENCOUNTER — Ambulatory Visit (INDEPENDENT_AMBULATORY_CARE_PROVIDER_SITE_OTHER): Payer: Self-pay | Admitting: Cardiology

## 2012-07-25 VITALS — BP 114/66 | HR 71 | Ht 64.0 in | Wt 166.0 lb

## 2012-07-25 DIAGNOSIS — I2510 Atherosclerotic heart disease of native coronary artery without angina pectoris: Secondary | ICD-10-CM

## 2012-07-25 DIAGNOSIS — E785 Hyperlipidemia, unspecified: Secondary | ICD-10-CM

## 2012-07-25 DIAGNOSIS — I6350 Cerebral infarction due to unspecified occlusion or stenosis of unspecified cerebral artery: Secondary | ICD-10-CM

## 2012-07-25 DIAGNOSIS — I639 Cerebral infarction, unspecified: Secondary | ICD-10-CM

## 2012-07-25 DIAGNOSIS — I5022 Chronic systolic (congestive) heart failure: Secondary | ICD-10-CM

## 2012-07-25 LAB — BASIC METABOLIC PANEL
BUN: 18 mg/dL (ref 6–23)
CO2: 27 mEq/L (ref 19–32)
Chloride: 104 mEq/L (ref 96–112)
Glucose, Bld: 48 mg/dL — CL (ref 70–99)
Potassium: 3.7 mEq/L (ref 3.5–5.1)

## 2012-07-25 LAB — LIPID PANEL
Cholesterol: 133 mg/dL (ref 0–200)
LDL Cholesterol: 61 mg/dL (ref 0–99)

## 2012-07-25 NOTE — Patient Instructions (Addendum)
Your physician recommends that you have  lab work today--BMET/Lipid.  Your physician has requested that you have an echocardiogram. Echocardiography is a painless test that uses sound waves to create images of your heart. It provides your doctor with information about the size and shape of your heart and how well your heart's chambers and valves are working. This procedure takes approximately one hour. There are no restrictions for this procedure.   Your physician wants you to follow-up in: 3 months with Dr Shirlee Latch. (July 2014). You will receive a reminder letter in the mail two months in advance. If you don't receive a letter, please call our office to schedule the follow-up appointment.

## 2012-07-26 NOTE — Progress Notes (Signed)
Patient ID: [name], female   DOB: [birth year], 52 y.o.   MRN: [id number] PCP: Dr. Evlyn Kanner  52 yo with history of Type I diabetes, CAD, and ischemic cardiomyopathy presents for cardiology followup.  She had a prolonged admission with DKA and systolic CHF in 12/11.  Cardiac enzymes were found to be elevated and left heart cath showed occluded LAD, which appeared chronic at the time of cath.  Echo showed peri-apical akinesis with EF 30%.  Management was complicated by orthostatic hypotension causing syncope.  This was likely due to combination of low cardiac output and diabetic autonomic neuropathy.  This limited titration of cardiac medications initially. Repeat echo (1/12) showed EF 40-45% (improved).  She was readmitted with DKA in 3/12, and cardiac enzymes were noted to be quite elevated with troponin peaking at 24 and CKMB at 69.  Repeat LHC was done, actually showing no significant difference from the 12/11 study.    In 5/12, she noted weakness one day in her right leg and arm.  She had a head MRI done, showing a small acute left parietal infarction.  This stroke occurred while on ASA and Plavix.  She has improved but her left arm still has some weakness. Carotid dopplers showed no significant carotid stenosis.  I did a limited echo to look for LV thrombus, and this was not seen.  3 week event monitor showed no atrial fibrillation.  Last echo in 7/12 showed EF 40-45%.  In the last few months, her cardiac meds have been cut back somewhat due to hyperkalemia.  At last appointment, she was having profound fatigue.  Her mother checked her BP at home, and found systolic BP to be running in the 90s-100s.  They stopped enalapril and she feels better now.  She was actually taking enalapril 2.5 mg bid at the time (I thought she was on a very low dose at 1.25 mg daily).  She also cut Lasix back to 20 mg daily.  She denies exertional dyspnea.  She still fatigues easily but thinks she is doing better.  No chest pain.   She is here alone today. Weight is stable.   Labs (12/11): K 3.7, creatinine 0.88, BNP 612, LDL 29, HDL 44 Labs (1/12): digoxin 1.0, BNP 199, K 4.4, creatinine 0.9 Labs (5/12): digoxin 0.4 Labs (6/12): K 4.2, creatinine 0.99 Labs (7/12): K 6.2, creatinine 1.3 (KCl and spironolactone held).  Labs (7/12), repeat: K 4.8, creatinine 1.2, BNP 173 Labs (11/12): K 4.6, creatinine 1.6 => 1.3, TnI 0.7 Labs (12/12): K 4.9, creatinine 1.5, LDL 76, HDL 55, BNP 103, digoxin 1.0 Labs (3/13): K 4.2, creatinine 1.1, digoxin 1.0 Labs (6/13): LDL 57, HDL 51, digoxin 0.3 Labs (62/95): K 4.4, creatinine 1.4 Labs (1/14): BNP 253 => 108, K 4.4, creatinine 1.3  Allergies (verified):  1)  Reglan 2)  Erythromycin  Past Medical History: 1. Hypertension: However, after MI had orthostatic hypotension.  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 CAD: NSTEMI 12/11.  Cardiac catheterization (12/11) showing LAD totalled in the midportion, diagonal 1 70% stenosed (small 2-mm vessel), circumflex nonobstructive, OM1 40%, OM2 30%, RCA small and no significant disease.  LAD occlusion was calcified and appeared chronic at the time of catheterization so no intervention.  Patient readmitted with DKA in 3/12, troponin found to be 24 and CKMB 69.  Repeat LHC showed no change from 12/11 study.  5. Ischemic cardiomyopathy: Echo (12/11) with EF 30% and periapical akinesis, no significant MR, RV looked  normal.  QRS is not wide on ECG.  Echo (1/12): EF 40-45%, severe hypokinesis of the anteroseptal wall, apical inferior wall, inferoseptal wall, and apex.  Hyperkalemia with spironolactone and with > 2.5 mg daily enalapril.  Echo 7/12 with EF 40-45%.  6. Orthostatic hypotension: Syncopal episode in 12/11 likely due to this.  Probably from combination of depressed cardiac output and diabetic autonomic neuropathy.  7. Type 1 insulin-dependent diabetes. History of DKA.  8. Hyperlipidemia.  9. Diabetic retinopathy.  10.  Gastroparesis.  11. Diabetic nephropathy  12. Depression.  13. 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  Carotid dopplers (7/12) with no significant disease.  Limited echo in 7/12 did not show an LV apical thrombus.  3-week event monitor in 7/12 did not show any runs of atrial fibrillation.   Family History: No FH of Colon Cancer: Family History of Pancreatic Cancer: Father deceased 72 No premature CAD  Social History: Divorced, 1 boy unemployed, lives in [place] Patient has never smoked.  Alcohol Use - no Daily Caffeine Use 1 cup/day Illicit Drug Use - no  ROS: All systems reviewed and negative except as per HPI.    Current Outpatient Prescriptions  Medication Sig Dispense Refill  . ALPRAZolam (XANAX) 0.5 MG tablet Take 0.5 mg by mouth daily as needed.       [name] aspirin EC 81 MG tablet Take 81 mg by mouth at bedtime.       [name] CALCIUM PO Take 500 mg by mouth daily.      . carvedilol (COREG) 6.25 MG tablet Take 1 tablet (6.25 mg total) by mouth 2 (two) times daily.  60 tablet  5  . citalopram (CELEXA) 20 MG tablet Take 20 mg by mouth daily.        . clopidogrel (PLAVIX) 75 MG tablet Take 1 tablet (75 mg total) by mouth daily.  30 tablet  5  . digoxin (LANOXIN) 0.125 MG tablet 1/2 every other day      . ferrous sulfate 325 (65 FE) MG tablet Take 325 mg by mouth 2 (two) times daily.       . furosemide (LASIX) 40 MG tablet 20 mg alternating with 40mg       . insulin aspart (NOVOLOG) 100 UNIT/ML injection Inject 2-3 Units into the skin 3 (three) times daily before meals. Sliding scale      . insulin glargine (LANTUS) 100 UNIT/ML injection Inject 8 Units into the skin as directed.       . Multiple Vitamins-Iron (QC DAILY MULTIVITAMINS/IRON) TABS Take 1 tablet by mouth daily.        . rosuvastatin (CRESTOR) 40 MG tablet Take 40 mg by mouth at bedtime as needed.   30 tablet  11  . Vitamin D, Ergocalciferol, (DRISDOL) 50000 UNITS CAPS Take 50,000 Units by mouth daily.      . enalapril  (VASOTEC) 2.5 MG tablet Take 1.25 mg by mouth daily.        No current facility-administered medications for this visit.    BP 114/66  Pulse 71  Ht 5\' 4"  (1.626 m)  Wt 166 lb (75.297 kg)  BMI 28.48 kg/m2  SpO2 98% General:  Well developed, well nourished, in no acute distress. Neck:  Neck thick, JVP difficult. No masses, thyromegaly or abnormal cervical nodes. Lungs:  Crackles right base Heart:  Non-displaced PMI, chest non-tender; regular rate and rhythm, S1, S2 without murmurs, rubs or gallops. Carotid upstroke normal, no bruit. Pedals normal pulses. No edema.   Abdomen:  Bowel sounds positive; abdomen soft and non-tender without masses, organomegaly, or hernias noted. No hepatosplenomegaly. Extremities:  No clubbing or cyanosis. Neurologic:  Alert and oriented x 3. Right arm is weak.  Psych:  Normal affect.  Assessment/Plan:  CHRONIC SYSTOLIC HEART FAILURE  EF 40-45% on last echo in 2012. Off spironolactone as she developed hyperkalemia while taking this medication and enalapril stopped due to low BP. I will continue her on the current doses of Coreg, digoxin, and Lasix.  She is only taking 20 mg daily of Lasix now and appears euvolemic.  Last digoxin level was ok.   - I will get a repeat echo to assess LV function in 5/14 as apparently she will be getting insurance at that time.  - Check BMET CORONARY ATHEROSCLEROSIS NATIVE CORONARY ARTERY  Stable coronary disease with occluded mid-LAD.  Continue current meds.  CVA (cerebral infarction)   Occurred while on aspirin and Plavix. Some residual right arm weakness. Carotid dopplers showed no significant disease and limited echo to look for LV apical thrombus was unremarkable. 3-week monitor did not show atrial fibrillation.  HYPERLIPIDEMIA  Continue Crestor.  Will get lipids today.  OSA (obstructive sleep apnea)  We talked about sleep study given fatigue and snoring but she wants to hold off.   I had a discussion by phone with her  mother today.  There have been some discrepancies in the medications that Mrs Hitt is taking and what I think she is taking.  Her mother has been concerned about her.  I have asked her mother to either come to appointments with her or send her pill bottles with her.     Marca Ancona 07/26/2012 3:55 PM

## 2012-08-29 ENCOUNTER — Other Ambulatory Visit (HOSPITAL_COMMUNITY): Payer: Self-pay

## 2012-09-02 ENCOUNTER — Encounter (HOSPITAL_COMMUNITY): Payer: Self-pay | Admitting: Cardiology

## 2012-09-02 ENCOUNTER — Other Ambulatory Visit: Payer: Self-pay

## 2012-09-02 ENCOUNTER — Ambulatory Visit (HOSPITAL_COMMUNITY): Payer: Medicare Other | Attending: Cardiology | Admitting: Radiology

## 2012-09-02 DIAGNOSIS — E785 Hyperlipidemia, unspecified: Secondary | ICD-10-CM | POA: Insufficient documentation

## 2012-09-02 DIAGNOSIS — I509 Heart failure, unspecified: Secondary | ICD-10-CM | POA: Insufficient documentation

## 2012-09-02 DIAGNOSIS — I252 Old myocardial infarction: Secondary | ICD-10-CM | POA: Insufficient documentation

## 2012-09-02 DIAGNOSIS — I2589 Other forms of chronic ischemic heart disease: Secondary | ICD-10-CM | POA: Insufficient documentation

## 2012-09-02 DIAGNOSIS — I2510 Atherosclerotic heart disease of native coronary artery without angina pectoris: Secondary | ICD-10-CM | POA: Insufficient documentation

## 2012-09-02 DIAGNOSIS — Z794 Long term (current) use of insulin: Secondary | ICD-10-CM | POA: Insufficient documentation

## 2012-09-02 DIAGNOSIS — I5022 Chronic systolic (congestive) heart failure: Secondary | ICD-10-CM

## 2012-09-02 DIAGNOSIS — E109 Type 1 diabetes mellitus without complications: Secondary | ICD-10-CM | POA: Insufficient documentation

## 2012-09-02 NOTE — Progress Notes (Signed)
Echocardiogram performed.  

## 2012-09-08 ENCOUNTER — Other Ambulatory Visit: Payer: Self-pay | Admitting: *Deleted

## 2012-09-08 DIAGNOSIS — I639 Cerebral infarction, unspecified: Secondary | ICD-10-CM

## 2012-09-08 DIAGNOSIS — I5022 Chronic systolic (congestive) heart failure: Secondary | ICD-10-CM

## 2012-09-08 DIAGNOSIS — I2510 Atherosclerotic heart disease of native coronary artery without angina pectoris: Secondary | ICD-10-CM

## 2012-09-08 MED ORDER — DIGOXIN 125 MCG PO TABS: ORAL_TABLET | ORAL | Status: DC

## 2012-09-08 MED ORDER — CLOPIDOGREL BISULFATE 75 MG PO TABS: 75.0000 mg | ORAL_TABLET | Freq: Every day | ORAL | Status: DC

## 2012-09-08 MED ORDER — CARVEDILOL 6.25 MG PO TABS: 6.2500 mg | ORAL_TABLET | Freq: Two times a day (BID) | ORAL | Status: DC

## 2012-09-08 MED ORDER — FUROSEMIDE 20 MG PO TABS: 20.0000 mg | ORAL_TABLET | Freq: Every day | ORAL | Status: DC

## 2012-09-16 ENCOUNTER — Encounter: Payer: Self-pay | Admitting: Cardiology

## 2012-11-11 ENCOUNTER — Ambulatory Visit: Payer: Medicare Other | Admitting: Cardiology

## 2012-11-14 ENCOUNTER — Ambulatory Visit: Payer: Medicare Other | Admitting: Cardiology

## 2012-12-11 ENCOUNTER — Ambulatory Visit (INDEPENDENT_AMBULATORY_CARE_PROVIDER_SITE_OTHER): Payer: Medicare Other | Admitting: Cardiology

## 2012-12-11 ENCOUNTER — Encounter: Payer: Self-pay | Admitting: Cardiology

## 2012-12-11 VITALS — BP 118/58 | HR 67 | Ht 64.0 in | Wt 167.0 lb

## 2012-12-11 DIAGNOSIS — I5022 Chronic systolic (congestive) heart failure: Secondary | ICD-10-CM

## 2012-12-11 DIAGNOSIS — E785 Hyperlipidemia, unspecified: Secondary | ICD-10-CM

## 2012-12-11 DIAGNOSIS — I2510 Atherosclerotic heart disease of native coronary artery without angina pectoris: Secondary | ICD-10-CM

## 2012-12-11 LAB — BASIC METABOLIC PANEL
BUN: 17 mg/dL (ref 6–23)
CO2: 30 mEq/L (ref 19–32)
Chloride: 108 mEq/L (ref 96–112)
Creatinine, Ser: 1.2 mg/dL (ref 0.4–1.2)

## 2012-12-11 NOTE — Patient Instructions (Addendum)
Your physician recommends that you have lab work today--BMET/BNP/Digoxin level.  Your physician recommends that you schedule a follow-up appointment in: 4 months with Dr Shirlee Latch.  Your physician recommends that you return for a FASTING lipid profile when you see Dr Shirlee Latch in 4 months.

## 2012-12-12 NOTE — Progress Notes (Signed)
Patient ID: [name], female   DOB: [birth year], 52 y.o.   MRN: [id number] PCP: Dr. Evlyn Kanner  52 yo with history of Type I diabetes, CAD, and ischemic cardiomyopathy presents for cardiology followup.  She had a prolonged admission with DKA and systolic CHF in 12/11.  Cardiac enzymes were found to be elevated and left heart cath showed occluded LAD, which appeared chronic at the time of cath.  Echo showed peri-apical akinesis with EF 30%.  Management was complicated by orthostatic hypotension causing syncope.  This was likely due to combination of low cardiac output and diabetic autonomic neuropathy.  This limited titration of cardiac medications initially. Repeat echo (1/12) showed EF 40-45% (improved).  She was readmitted with DKA in 3/12, and cardiac enzymes were noted to be quite elevated with troponin peaking at 24 and CKMB at 69.  Repeat LHC was done, actually showing no significant difference from the 12/11 study.    In 5/12, she noted weakness one day in her right leg and arm.  She had a head MRI done, showing a small acute left parietal infarction.  This stroke occurred while on ASA and Plavix.  She has improved but her left arm still has some weakness. Carotid dopplers showed no significant carotid stenosis.  I did a limited echo to look for LV thrombus, and this was not seen.  3 week event monitor showed no atrial fibrillation.    Last echo in 5/14 showed EF 40% (stable).  We have had to cut back on her cardiac meds due to hyperkalemia and hypotension.  She is now off ACEI completely (was hypotensive with even a low dose).  Currently, doing ok.  Stable dyspnea walking in her driveway.  No dyspnea walking in the house.  No chest pain.  No lightheadedness or syncope.  Stable weight.  Her mother says that she sleeps a lot during the day.  She does not snore or gasp in her sleep.  She cries a lot per her mother.    ECG: NSR, old ASMI  Labs (12/11): K 3.7, creatinine 0.88, BNP 612, LDL 29, HDL 44 Labs  (1/12): digoxin 1.0, BNP 199, K 4.4, creatinine 0.9 Labs (5/12): digoxin 0.4 Labs (6/12): K 4.2, creatinine 0.99 Labs (7/12): K 6.2, creatinine 1.3 (KCl and spironolactone held).  Labs (7/12), repeat: K 4.8, creatinine 1.2, BNP 173 Labs (11/12): K 4.6, creatinine 1.6 => 1.3, TnI 0.7 Labs (12/12): K 4.9, creatinine 1.5, LDL 76, HDL 55, BNP 103, digoxin 1.0 Labs (3/13): K 4.2, creatinine 1.1, digoxin 1.0 Labs (6/13): LDL 57, HDL 51, digoxin 0.3 Labs (40/98): K 4.4, creatinine 1.4 Labs (1/14): BNP 253 => 108, K 4.4, creatinine 1.3 Labs (4/14): K 3.7, creatinine 1.2, LDL 61, HDL 57  Allergies (verified):  1)  Reglan 2)  Erythromycin  Past Medical History: 1. Hypertension: However, after MI had orthostatic hypotension.  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 CAD: NSTEMI 12/11.  Cardiac catheterization (12/11) showing LAD totalled in the midportion, diagonal 1 70% stenosed (small 2-mm vessel), circumflex nonobstructive, OM1 40%, OM2 30%, RCA small and no significant disease.  LAD occlusion was calcified and appeared chronic at the time of catheterization so no intervention.  Patient readmitted with DKA in 3/12, troponin found to be 24 and CKMB 69.  Repeat LHC showed no change from 12/11 study.  5. Ischemic cardiomyopathy: Echo (12/11) with EF 30% and periapical akinesis, no significant MR, RV looked normal.  QRS is not wide on ECG.  Echo (1/12): EF 40-45%, severe hypokinesis of the anteroseptal wall, apical inferior wall, inferoseptal wall, and apex.  Hyperkalemia with spironolactone and with > 2.5 mg daily enalapril.  Echo 7/12 with EF 40-45%.  Echo (5/14): EF 40%, mid-apical anteroseptal hypokinesis, grade II diastolic dysfunction.  6. Orthostatic hypotension: Syncopal episode in 12/11 likely due to this.  Probably from combination of depressed cardiac output and diabetic autonomic neuropathy.  7. Type 1 insulin-dependent diabetes. History of DKA.  8. Hyperlipidemia.  9. Diabetic  retinopathy.  10. Gastroparesis.  11. Diabetic nephropathy  12. Depression.  13. 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  Carotid dopplers (7/12) with no significant disease.  Limited echo in 7/12 did not show an LV apical thrombus.  3-week event monitor in 7/12 did not show any runs of atrial fibrillation.   Family History: No FH of Colon Cancer: Family History of Pancreatic Cancer: Father deceased 33 No premature CAD  Social History: Divorced, 1 boy unemployed, lives in [place] Patient has never smoked.  Alcohol Use - no Daily Caffeine Use 1 cup/day Illicit Drug Use - no  ROS: All systems reviewed and negative except as per HPI.    Current Outpatient Prescriptions  Medication Sig Dispense Refill  . ALPRAZolam (XANAX) 0.5 MG tablet Take 0.5 mg by mouth daily as needed.       [name] aspirin EC 81 MG tablet Take 81 mg by mouth at bedtime.       [name] CALCIUM PO Take 500 mg by mouth daily.      . carvedilol (COREG) 6.25 MG tablet Take 1 tablet (6.25 mg total) by mouth 2 (two) times daily.  60 tablet  6  . citalopram (CELEXA) 20 MG tablet Take 20 mg by mouth daily.        . clopidogrel (PLAVIX) 75 MG tablet Take 1 tablet (75 mg total) by mouth daily.  30 tablet  6  . digoxin (LANOXIN) 0.125 MG tablet 1/2 every other day  15 tablet  6  . ferrous sulfate 325 (65 FE) MG tablet Take 325 mg by mouth 2 (two) times daily.       . furosemide (LASIX) 20 MG tablet Take 1 tablet (20 mg total) by mouth daily.  30 tablet  6  . insulin aspart (NOVOLOG) 100 UNIT/ML injection Inject 2-3 Units into the skin 3 (three) times daily before meals. Sliding scale      . insulin glargine (LANTUS) 100 UNIT/ML injection Inject 8 Units into the skin as directed.       . Multiple Vitamins-Iron (QC DAILY MULTIVITAMINS/IRON) TABS Take 1 tablet by mouth daily.        . rosuvastatin (CRESTOR) 40 MG tablet Take 40 mg by mouth daily.   30 tablet  11  . Vitamin D, Ergocalciferol, (DRISDOL) 50000 UNITS CAPS Take 50,000  Units by mouth every 7 (seven) days.        No current facility-administered medications for this visit.    BP 118/58  Pulse 67  Ht 5\' 4"  (1.626 m)  Wt 75.751 kg (167 lb)  BMI 28.65 kg/m2 General:  Well developed, well nourished, in no acute distress. Neck:  Neck thick, JVP difficult. No masses, thyromegaly or abnormal cervical nodes. Lungs:  Crackles right base Heart:  Non-displaced PMI, chest non-tender; regular rate and rhythm, S1, S2 without murmurs, rubs or gallops. Carotid upstroke normal, no bruit. Pedals normal pulses. No edema.   Abdomen:  Bowel sounds positive; abdomen soft and non-tender without masses, organomegaly, or  hernias noted. No hepatosplenomegaly. Extremities:  No clubbing or cyanosis. Neurologic:  Alert and oriented x 3. Right arm is weak.  Psych:  Normal affect.  Assessment/Plan:  CHRONIC SYSTOLIC HEART FAILURE  EF 40% (stable) on recent echo.  Off spironolactone as she developed hyperkalemia while taking this medication and enalapril stopped due to low BP. I will continue her on the current doses of Coreg, digoxin, and Lasix.  She is only taking 20 mg daily of Lasix now and appears euvolemic.   - Check BMET and digoxin level. CORONARY ATHEROSCLEROSIS NATIVE CORONARY ARTERY  Stable coronary disease with occluded mid-LAD.  Continue current meds.  CVA (cerebral infarction)   Occurred while on aspirin and Plavix. Some residual right arm weakness. Carotid dopplers showed no significant disease and limited echo to look for LV apical thrombus was unremarkable. 3-week monitor did not show atrial fibrillation.  HYPERLIPIDEMIA  Continue Crestor.  Good lipids in 4/14.  Depression Patient sleeps a lot and is often tearful.  I think that her depression could be better controlled.  She will talk to her PCP.     Marca Ancona 12/12/2012 7:02 PM

## 2013-01-29 ENCOUNTER — Telehealth: Payer: Self-pay | Admitting: *Deleted

## 2013-02-14 ENCOUNTER — Inpatient Hospital Stay (HOSPITAL_COMMUNITY)
Admission: EM | Admit: 2013-02-14 | Discharge: 2013-02-17 | DRG: 638 | Disposition: A | Discharge status: Home / Self Care | Payer: Medicare Other | Attending: Endocrinology | Admitting: Endocrinology

## 2013-02-14 ENCOUNTER — Encounter (HOSPITAL_COMMUNITY): Payer: Self-pay | Admitting: Emergency Medicine

## 2013-02-14 ENCOUNTER — Emergency Department (HOSPITAL_COMMUNITY): Payer: Medicare Other

## 2013-02-14 DIAGNOSIS — E1039 Type 1 diabetes mellitus with other diabetic ophthalmic complication: Secondary | ICD-10-CM | POA: Diagnosis present

## 2013-02-14 DIAGNOSIS — Z79899 Other long term (current) drug therapy: Secondary | ICD-10-CM

## 2013-02-14 DIAGNOSIS — E1029 Type 1 diabetes mellitus with other diabetic kidney complication: Secondary | ICD-10-CM | POA: Diagnosis present

## 2013-02-14 DIAGNOSIS — E1110 Type 2 diabetes mellitus with ketoacidosis without coma: Secondary | ICD-10-CM | POA: Diagnosis present

## 2013-02-14 DIAGNOSIS — G909 Disorder of the autonomic nervous system, unspecified: Secondary | ICD-10-CM | POA: Diagnosis present

## 2013-02-14 DIAGNOSIS — F3289 Other specified depressive episodes: Secondary | ICD-10-CM | POA: Diagnosis present

## 2013-02-14 DIAGNOSIS — E1049 Type 1 diabetes mellitus with other diabetic neurological complication: Secondary | ICD-10-CM | POA: Diagnosis present

## 2013-02-14 DIAGNOSIS — E559 Vitamin D deficiency, unspecified: Secondary | ICD-10-CM | POA: Diagnosis present

## 2013-02-14 DIAGNOSIS — R739 Hyperglycemia, unspecified: Secondary | ICD-10-CM

## 2013-02-14 DIAGNOSIS — E1010 Type 1 diabetes mellitus with ketoacidosis without coma: Principal | ICD-10-CM | POA: Diagnosis present

## 2013-02-14 DIAGNOSIS — R5383 Other fatigue: Secondary | ICD-10-CM

## 2013-02-14 DIAGNOSIS — E11359 Type 2 diabetes mellitus with proliferative diabetic retinopathy without macular edema: Secondary | ICD-10-CM | POA: Diagnosis present

## 2013-02-14 DIAGNOSIS — E785 Hyperlipidemia, unspecified: Secondary | ICD-10-CM | POA: Diagnosis present

## 2013-02-14 DIAGNOSIS — I10 Essential (primary) hypertension: Secondary | ICD-10-CM | POA: Diagnosis present

## 2013-02-14 DIAGNOSIS — E1065 Type 1 diabetes mellitus with hyperglycemia: Secondary | ICD-10-CM | POA: Diagnosis present

## 2013-02-14 DIAGNOSIS — I2589 Other forms of chronic ischemic heart disease: Secondary | ICD-10-CM | POA: Diagnosis present

## 2013-02-14 DIAGNOSIS — I255 Ischemic cardiomyopathy: Secondary | ICD-10-CM | POA: Diagnosis present

## 2013-02-14 DIAGNOSIS — I2510 Atherosclerotic heart disease of native coronary artery without angina pectoris: Secondary | ICD-10-CM | POA: Diagnosis present

## 2013-02-14 DIAGNOSIS — I252 Old myocardial infarction: Secondary | ICD-10-CM

## 2013-02-14 DIAGNOSIS — D729 Disorder of white blood cells, unspecified: Secondary | ICD-10-CM

## 2013-02-14 DIAGNOSIS — Z794 Long term (current) use of insulin: Secondary | ICD-10-CM

## 2013-02-14 DIAGNOSIS — E860 Dehydration: Secondary | ICD-10-CM | POA: Diagnosis present

## 2013-02-14 DIAGNOSIS — I69998 Other sequelae following unspecified cerebrovascular disease: Secondary | ICD-10-CM

## 2013-02-14 DIAGNOSIS — E1139 Type 2 diabetes mellitus with other diabetic ophthalmic complication: Secondary | ICD-10-CM | POA: Diagnosis present

## 2013-02-14 DIAGNOSIS — N289 Disorder of kidney and ureter, unspecified: Secondary | ICD-10-CM | POA: Diagnosis present

## 2013-02-14 DIAGNOSIS — Z7902 Long term (current) use of antithrombotics/antiplatelets: Secondary | ICD-10-CM

## 2013-02-14 DIAGNOSIS — I5022 Chronic systolic (congestive) heart failure: Secondary | ICD-10-CM | POA: Diagnosis present

## 2013-02-14 DIAGNOSIS — R4182 Altered mental status, unspecified: Secondary | ICD-10-CM | POA: Diagnosis present

## 2013-02-14 DIAGNOSIS — F329 Major depressive disorder, single episode, unspecified: Secondary | ICD-10-CM | POA: Diagnosis present

## 2013-02-14 DIAGNOSIS — Z7982 Long term (current) use of aspirin: Secondary | ICD-10-CM

## 2013-02-14 DIAGNOSIS — N058 Unspecified nephritic syndrome with other morphologic changes: Secondary | ICD-10-CM | POA: Diagnosis present

## 2013-02-14 LAB — URINALYSIS, ROUTINE W REFLEX MICROSCOPIC
Glucose, UA: >1000 mg/dL — AB
Hgb urine dipstick: NEGATIVE
Ketones, ur: >80 mg/dL — AB
Leukocytes, UA: NEGATIVE
Nitrite: NEGATIVE
Protein, ur: 30 mg/dL — AB
Urobilinogen, UA: 0.2 mg/dL (ref 0.0–1.0)
pH: 5 (ref 5.0–8.0)

## 2013-02-14 LAB — HEPATIC FUNCTION PANEL
ALT: 18 U/L (ref 0–35)
AST: 15 U/L (ref 0–37)
Albumin: 4 g/dL (ref 3.5–5.2)
Bilirubin, Direct: 0.1 mg/dL (ref 0.0–0.3)
Indirect Bilirubin: 0.5 mg/dL (ref 0.3–0.9)
Total Bilirubin: 0.6 mg/dL (ref 0.3–1.2)
Total Protein: 8.3 g/dL (ref 6.0–8.3)

## 2013-02-14 LAB — POCT I-STAT 3, VENOUS BLOOD GAS (G3P V)
Patient temperature: 98.4
pCO2, Ven: 39.9 mmHg — ABNORMAL LOW (ref 45.0–50.0)
pH, Ven: 7.34 — ABNORMAL HIGH (ref 7.250–7.300)
pO2, Ven: 24 mmHg — CL (ref 30.0–45.0)

## 2013-02-14 LAB — URINE MICROSCOPIC-ADD ON

## 2013-02-14 LAB — CBC WITH DIFFERENTIAL/PLATELET
Basophils Absolute: 0 10*3/uL (ref 0.0–0.1)
Basophils Relative: 0 % (ref 0–1)
Eosinophils Absolute: 0 10*3/uL (ref 0.0–0.7)
Hemoglobin: 15.7 g/dL — ABNORMAL HIGH (ref 12.0–15.0)
Lymphs Abs: 2.1 10*3/uL (ref 0.7–4.0)
MCHC: 35.4 g/dL (ref 30.0–36.0)
Monocytes Relative: 6 % (ref 3–12)
Neutro Abs: 12.6 10*3/uL — ABNORMAL HIGH (ref 1.7–7.7)
Neutrophils Relative %: 81 % — ABNORMAL HIGH (ref 43–77)
Platelets: 238 10*3/uL (ref 150–400)
RBC: 5.08 MIL/uL (ref 3.87–5.11)
WBC: 15.7 10*3/uL — ABNORMAL HIGH (ref 4.0–10.5)

## 2013-02-14 LAB — AMMONIA: Ammonia: 20 umol/L (ref 11–60)

## 2013-02-14 LAB — BASIC METABOLIC PANEL
BUN: 49 mg/dL — ABNORMAL HIGH (ref 6–23)
Calcium: 10.7 mg/dL — ABNORMAL HIGH (ref 8.4–10.5)
Chloride: 102 mEq/L (ref 96–112)
GFR calc Af Amer: 38 mL/min — ABNORMAL LOW (ref >90)
GFR calc non Af Amer: 33 mL/min — ABNORMAL LOW (ref >90)
Glucose, Bld: 303 mg/dL — ABNORMAL HIGH (ref 70–99)
Potassium: 4.2 mEq/L (ref 3.5–5.1)
Sodium: 144 mEq/L (ref 135–145)

## 2013-02-14 LAB — RAPID URINE DRUG SCREEN, HOSP PERFORMED
Amphetamines: NOT DETECTED
Barbiturates: NOT DETECTED
Benzodiazepines: POSITIVE — AB
Cocaine: NOT DETECTED
Opiates: NOT DETECTED
Tetrahydrocannabinol: NOT DETECTED

## 2013-02-14 LAB — CG4 I-STAT (LACTIC ACID): Lactic Acid, Venous: 1.64 mmol/L (ref 0.5–2.2)

## 2013-02-14 LAB — SALICYLATE LEVEL: Salicylate Lvl: <2 mg/dL — ABNORMAL LOW (ref 2.8–20.0)

## 2013-02-14 MED ORDER — ALPRAZOLAM 0.5 MG PO TABS
0.5000 mg | ORAL_TABLET | Freq: Two times a day (BID) | ORAL | Status: DC | PRN
  Administered 2013-02-15: 0.5 mg via ORAL
  Filled 2013-02-14: qty 2

## 2013-02-14 MED ORDER — SODIUM CHLORIDE 0.9 % IV SOLN
Freq: Once | INTRAVENOUS | Status: AC
  Administered 2013-02-14: 20:00:00 via INTRAVENOUS

## 2013-02-14 MED ORDER — CITALOPRAM HYDROBROMIDE 20 MG PO TABS
20.0000 mg | ORAL_TABLET | Freq: Every day | ORAL | Status: DC
  Administered 2013-02-15 – 2013-02-17 (×3): 20 mg via ORAL
  Filled 2013-02-14 (×3): qty 1

## 2013-02-14 MED ORDER — ASPIRIN EC 81 MG PO TBEC
81.0000 mg | DELAYED_RELEASE_TABLET | Freq: Every day | ORAL | Status: DC
  Administered 2013-02-15 – 2013-02-16 (×3): 81 mg via ORAL
  Filled 2013-02-14 (×5): qty 1

## 2013-02-14 MED ORDER — CARVEDILOL 6.25 MG PO TABS
6.2500 mg | ORAL_TABLET | Freq: Two times a day (BID) | ORAL | Status: DC
  Administered 2013-02-15 – 2013-02-17 (×6): 6.25 mg via ORAL
  Filled 2013-02-14 (×8): qty 1

## 2013-02-14 MED ORDER — SODIUM CHLORIDE 0.9 % IV BOLUS (SEPSIS)
500.0000 mL | Freq: Once | INTRAVENOUS | Status: AC
  Administered 2013-02-14: 500 mL via INTRAVENOUS

## 2013-02-14 MED ORDER — DEXTROSE-NACL 5-0.45 % IV SOLN: INTRAVENOUS | Status: DC

## 2013-02-14 MED ORDER — CLOPIDOGREL BISULFATE 75 MG PO TABS
75.0000 mg | ORAL_TABLET | Freq: Every day | ORAL | Status: DC
  Administered 2013-02-15 – 2013-02-17 (×3): 75 mg via ORAL
  Filled 2013-02-14 (×4): qty 1

## 2013-02-14 MED ORDER — DEXTROSE-NACL 5-0.45 % IV SOLN
INTRAVENOUS | Status: DC
  Administered 2013-02-15: 05:00:00 via INTRAVENOUS

## 2013-02-14 MED ORDER — SODIUM CHLORIDE 0.9 % IV SOLN: INTRAVENOUS | Status: DC

## 2013-02-14 MED ORDER — INSULIN GLARGINE 100 UNIT/ML ~~LOC~~ SOLN
8.0000 [IU] | Freq: Every day | SUBCUTANEOUS | Status: DC
  Administered 2013-02-15: 8 [IU] via SUBCUTANEOUS
  Filled 2013-02-14 (×2): qty 0.08

## 2013-02-14 MED ORDER — SODIUM CHLORIDE 0.9 % IV SOLN
INTRAVENOUS | Status: DC
  Administered 2013-02-15: via INTRAVENOUS

## 2013-02-14 MED ORDER — SODIUM CHLORIDE 0.9 % IV SOLN
INTRAVENOUS | Status: AC
  Administered 2013-02-15: 3.5 [IU]/h via INTRAVENOUS
  Filled 2013-02-14: qty 1

## 2013-02-14 MED ORDER — NALOXONE HCL 0.4 MG/ML IJ SOLN: 0.4000 mg | Freq: Once | INTRAMUSCULAR | Status: DC

## 2013-02-14 MED ORDER — ATORVASTATIN CALCIUM 40 MG PO TABS
40.0000 mg | ORAL_TABLET | Freq: Every day | ORAL | Status: DC
  Administered 2013-02-15 – 2013-02-16 (×2): 40 mg via ORAL
  Filled 2013-02-14 (×4): qty 1

## 2013-02-14 MED ORDER — VITAMIN D (ERGOCALCIFEROL) 1.25 MG (50000 UNIT) PO CAPS: 50000.0000 [IU] | ORAL_CAPSULE | ORAL | Status: DC

## 2013-02-14 MED ORDER — DEXTROSE 50 % IV SOLN: 25.0000 mL | INTRAVENOUS | Status: DC | PRN

## 2013-02-14 MED ORDER — ENOXAPARIN SODIUM 40 MG/0.4ML ~~LOC~~ SOLN
40.0000 mg | SUBCUTANEOUS | Status: DC
  Administered 2013-02-15 – 2013-02-17 (×3): 40 mg via SUBCUTANEOUS
  Filled 2013-02-14 (×3): qty 0.4

## 2013-02-14 MED ORDER — DIGOXIN 0.0625 MG HALF TABLET
0.0625 mg | ORAL_TABLET | Freq: Every day | ORAL | Status: DC
  Administered 2013-02-15 – 2013-02-17 (×3): 0.0625 mg via ORAL
  Filled 2013-02-14 (×3): qty 1

## 2013-02-14 MED ORDER — POTASSIUM CHLORIDE 10 MEQ/100ML IV SOLN
10.0000 meq | INTRAVENOUS | Status: AC
  Administered 2013-02-15 (×4): 10 meq via INTRAVENOUS
  Filled 2013-02-14 (×4): qty 100

## 2013-02-14 MED ORDER — TAB-A-VITE/IRON PO TABS
1.0000 | ORAL_TABLET | Freq: Every day | ORAL | Status: DC
  Administered 2013-02-15 – 2013-02-17 (×3): 1 via ORAL
  Filled 2013-02-14 (×3): qty 1

## 2013-02-14 NOTE — H&P (Signed)
[name] is an 52 y.o. female.   Chief Complaint:  I feel bad HPI:  The patient is a 52 year old Caucasian woman with multiple medical problems, most notably diabetes mellitus, type I for over 40 years, coronary artery disease, hypertension, hyperlipidemia, previous stroke causing right-sided weakness, who for the past one to 2 days has had a change in her mental status with marked fatigue and sleep much of yesterday, and decreased food and fluid intake. Today she had an episode of nausea and vomiting and tested positive for ketones. Earlier today her capillary blood glucose level was greater than 600. Her mother, who is her primary caretaker continue to administer insulin to try to correct her hyperglycemia. However, since her fatigue and mental sluggishness did not improve, she brought her to the emergency room for evaluation as directed by her primary care physician, Dr. Adrian Prince. Currently, she feels slightly better and is no longer having nausea. She still feels out of sorts with fatigue and slowed thinking. She has not had recent fever, chills, productive cough, chest pain, abdominal pain, dysuria, or frequency. Past Medical History  Diagnosis Date  . Hypertension   . Vitamin D deficiency   . Coronary artery disease     NSTEMI 12/11. Cardiac catheterization (12/11) showing LAD totalled in the midportion, diagonal 70% stenosed (small 2-mm vessel), circumflex nonobstructive, OM1 40%, OM2 30%, RCA samll and no significat disease.  LAD occlusion was calcified and appeared chronic at the time of cathetrization so no intervention.  . Ischemic cardiomyopathy     Echo 12/11 with EF 30% and periapical akinesis, no significant MR. RV  looked normal. QRS is not wide on ECG.  Echo (1/12):  EF 40-45%, severe hypokinesis of the anteroseptal wall, apical inferior wall, inferoseptal wall, and apex.  . Orthostatic hypotension     sycopal episode in 12/11 likely due to this.  Probably from combination of  depressed cardiac out put and diabetic autonomic neuropathy.  . Diabetes mellitus     Insulin dependant Type I   . Hyperlipidemia   . Diabetic nephropathy   . Diabetic retinopathy   . Gastroparesis   . Depression   . Systolic heart failure   . Iron deficiency anemia   . Hx MRSA infection   . Osteopenia   . Esophagitis   . Heart attack   . Stroke May 2012    weakness right side - right hand and right arm the worst     (Not in a hospital admission)  ADDITIONAL HOME MEDICATIONS: No additional home medications  PHYSICIANS INVOLVED IN CARE: Adrian Prince (primary care)  Past Surgical History  Procedure Laterality Date  . Appendectomy  1980  . Tubal ligation  1987  . Cesarean section  1984  . Refractive surgery      x3    Family History  Problem Relation Age of Onset  . Pancreatic cancer Father   . Colon cancer Neg Hx   . Diabetes Maternal Grandfather      Social History:  reports that she has never smoked. She has never used smokeless tobacco. She reports that she does not drink alcohol or use illicit drugs.  Allergies:  Allergies  Allergen Reactions  . Erythromycin     REACTION: GI upset  . Food Nausea Only    Green peas  . Metoclopramide Hcl     REACTION: fatique, depression     ROS: anemia, change in vision, diabetes, Heart disease, high blood pressure and kidney disease  PHYSICAL EXAM: Blood pressure 153/62, pulse 88, temperature 98.7 F (37.1 C), temperature source Rectal, resp. rate 13, height 5\' 4"  (1.626 m), weight 72.576 kg (160 lb), SpO2 98.00%. In general, she is a chronically ill-appearing woman who is fatigued, and otherwise in no apparent distress while lying supine in bed. HEENT exam was significant for dry oral mucosa, neck was supple without jugular venous distention or carotid bruit, chest was clear to auscultation, heart had a regular rate and rhythm and was without significant murmur or gallop, abdomen had normal bowel sounds no  hepatosplenomegaly or tenderness, extremities were without cyanosis, clubbing, or edema and the bilateral pedal pulses were 1+. She was alert and oriented x3, cranial nerves II through XII are normal, she has mild right upper term he weakness. Light touch sensation in her feet is intact. She was able to move all extremities.  Results for orders placed during the hospital encounter of 02/14/13 (from the past 48 hour(s))  AMMONIA     Status: None   Collection Time    02/14/13  6:40 PM      Result Value Range   Ammonia 20  11 - 60 umol/L  CBC WITH DIFFERENTIAL     Status: Abnormal   Collection Time    02/14/13  6:46 PM      Result Value Range   WBC 15.7 (*) 4.0 - 10.5 K/uL   RBC 5.08  3.87 - 5.11 MIL/uL   Hemoglobin 15.7 (*) 12.0 - 15.0 g/dL   HCT 16.1  09.6 - 04.5 %   MCV 87.4  78.0 - 100.0 fL   MCH 30.9  26.0 - 34.0 pg   MCHC 35.4  30.0 - 36.0 g/dL   RDW 40.9  81.1 - 91.4 %   Platelets 238  150 - 400 K/uL   Neutrophils Relative % 81 (*) 43 - 77 %   Neutro Abs 12.6 (*) 1.7 - 7.7 K/uL   Lymphocytes Relative 14  12 - 46 %   Lymphs Abs 2.1  0.7 - 4.0 K/uL   Monocytes Relative 6  3 - 12 %   Monocytes Absolute 0.9  0.1 - 1.0 K/uL   Eosinophils Relative 0  0 - 5 %   Eosinophils Absolute 0.0  0.0 - 0.7 K/uL   Basophils Relative 0  0 - 1 %   Basophils Absolute 0.0  0.0 - 0.1 K/uL  BASIC METABOLIC PANEL     Status: Abnormal   Collection Time    02/14/13  6:46 PM      Result Value Range   Sodium 144  135 - 145 mEq/L   Potassium 4.2  3.5 - 5.1 mEq/L   Chloride 102  96 - 112 mEq/L   CO2 22  19 - 32 mEq/L   Glucose, Bld 303 (*) 70 - 99 mg/dL   BUN 49 (*) 6 - 23 mg/dL   Creatinine, Ser 7.82 (*) 0.50 - 1.10 mg/dL   Calcium 95.6 (*) 8.4 - 10.5 mg/dL   GFR calc non Af Amer 33 (*) >90 mL/min   GFR calc Af Amer 38 (*) >90 mL/min   Comment: (NOTE)     The eGFR has been calculated using the CKD EPI equation.     This calculation has not been validated in all clinical situations.     eGFR's  persistently <90 mL/min signify possible Chronic Kidney     Disease.  HEPATIC FUNCTION PANEL     Status: None   Collection Time  02/14/13  6:46 PM      Result Value Range   Total Protein 8.3  6.0 - 8.3 g/dL   Albumin 4.0  3.5 - 5.2 g/dL   AST 15  0 - 37 U/L   ALT 18  0 - 35 U/L   Alkaline Phosphatase 90  39 - 117 U/L   Total Bilirubin 0.6  0.3 - 1.2 mg/dL   Bilirubin, Direct 0.1  0.0 - 0.3 mg/dL   Indirect Bilirubin 0.5  0.3 - 0.9 mg/dL  ACETAMINOPHEN LEVEL     Status: None   Collection Time    02/14/13  6:46 PM      Result Value Range   Acetaminophen (Tylenol), Serum <15.0  10 - 30 ug/mL   Comment:            THERAPEUTIC CONCENTRATIONS VARY     SIGNIFICANTLY. A RANGE OF 10-30     ug/mL MAY BE AN EFFECTIVE     CONCENTRATION FOR MANY PATIENTS.     HOWEVER, SOME ARE BEST TREATED     AT CONCENTRATIONS OUTSIDE THIS     RANGE.     ACETAMINOPHEN CONCENTRATIONS     >150 ug/mL AT 4 HOURS AFTER     INGESTION AND >50 ug/mL AT 12     HOURS AFTER INGESTION ARE     OFTEN ASSOCIATED WITH TOXIC     REACTIONS.  SALICYLATE LEVEL     Status: Abnormal   Collection Time    02/14/13  6:46 PM      Result Value Range   Salicylate Lvl <2.0 (*) 2.8 - 20.0 mg/dL  TROPONIN I     Status: None   Collection Time    02/14/13  6:46 PM      Result Value Range   Troponin I <0.30  <0.30 ng/mL   Comment:            Due to the release kinetics of cTnI,     a negative result within the first hours     of the onset of symptoms does not rule out     myocardial infarction with certainty.     If myocardial infarction is still suspected,     repeat the test at appropriate intervals.  CG4 I-STAT (LACTIC ACID)     Status: None   Collection Time    02/14/13  6:49 PM      Result Value Range   Lactic Acid, Venous 1.64  0.5 - 2.2 mmol/L  URINE RAPID DRUG SCREEN (HOSP PERFORMED)     Status: Abnormal   Collection Time    02/14/13  7:48 PM      Result Value Range   Opiates NONE DETECTED  NONE DETECTED    Cocaine NONE DETECTED  NONE DETECTED   Benzodiazepines POSITIVE (*) NONE DETECTED   Amphetamines NONE DETECTED  NONE DETECTED   Tetrahydrocannabinol NONE DETECTED  NONE DETECTED   Barbiturates NONE DETECTED  NONE DETECTED   Comment:            DRUG SCREEN FOR MEDICAL PURPOSES     ONLY.  IF CONFIRMATION IS NEEDED     FOR ANY PURPOSE, NOTIFY LAB     WITHIN 5 DAYS.                LOWEST DETECTABLE LIMITS     FOR URINE DRUG SCREEN     Drug Class       Cutoff (ng/mL)     Amphetamine  1000     Barbiturate      200     Benzodiazepine   200     Tricyclics       300     Opiates          300     Cocaine          300     THC              50  URINALYSIS, ROUTINE W REFLEX MICROSCOPIC     Status: Abnormal   Collection Time    02/14/13  7:48 PM      Result Value Range   Color, Urine YELLOW  YELLOW   APPearance CLEAR  CLEAR   Specific Gravity, Urine 1.042 (*) 1.005 - 1.030   pH 5.0  5.0 - 8.0   Glucose, UA >1000 (*) NEGATIVE mg/dL   Hgb urine dipstick NEGATIVE  NEGATIVE   Bilirubin Urine MODERATE (*) NEGATIVE   Ketones, ur >80 (*) NEGATIVE mg/dL   Protein, ur 30 (*) NEGATIVE mg/dL   Urobilinogen, UA 0.2  0.0 - 1.0 mg/dL   Nitrite NEGATIVE  NEGATIVE   Leukocytes, UA NEGATIVE  NEGATIVE  URINE MICROSCOPIC-ADD ON     Status: Abnormal   Collection Time    02/14/13  7:48 PM      Result Value Range   Squamous Epithelial / LPF RARE  RARE   WBC, UA 0-2  <3 WBC/hpf   RBC / HPF 0-2  <3 RBC/hpf   Casts HYALINE CASTS (*) NEGATIVE  POCT I-STAT 3, BLOOD GAS (G3P V)     Status: Abnormal   Collection Time    02/14/13  9:19 PM      Result Value Range   pH, Ven 7.340 (*) 7.250 - 7.300   pCO2, Ven 39.9 (*) 45.0 - 50.0 mmHg   pO2, Ven 24.0 (*) 30.0 - 45.0 mmHg   Bicarbonate 21.6  20.0 - 24.0 mEq/L   TCO2 23  0 - 100 mmol/L   O2 Saturation 39.0     Acid-base deficit 4.0 (*) 0.0 - 2.0 mmol/L   Patient temperature 98.4 F     Sample type VENOUS     Comment MD NOTIFIED, SUGGEST RECOLLECT     Ct  Head Wo Contrast  02/14/2013   CLINICAL DATA:  Headache, hyperglycemia  EXAM: CT HEAD WITHOUT CONTRAST  TECHNIQUE: Contiguous axial images were obtained from the base of the skull through the vertex without intravenous contrast.  COMPARISON:  03/21/2011  FINDINGS: No skull fracture is noted. Paranasal sinuses and mastoid air cells are unremarkable. Moderate cerebral atrophy. No intracranial hemorrhage, mass effect or midline shift. Ventricular size is stable from prior exam. No acute cortical infarction. No mass lesion is noted on this unenhanced scan.  IMPRESSION: No acute intracranial abnormality. Moderate cerebral atrophy.   Electronically Signed   By: Natasha Mead M.D.   On: 02/14/2013 19:43   Dg Chest Port 1 View  02/14/2013   CLINICAL DATA:  Altered mental status.  EXAM: PORTABLE CHEST - 1 VIEW  COMPARISON:  May 02, 2012.  FINDINGS: The heart size and mediastinal contours are within normal limits. Both lungs are clear. Old right rib fracture is again noted.  IMPRESSION: No active disease.   Electronically Signed   By: Roque Lias M.D.   On: 02/14/2013 19:12     Assessment/Plan #1 DKA: She has mild DKA with a low normal total CO2, but an anion gap of  20 and altered mental status. Accordingly, she warrants inpatient management with IV fluids and IV insulin, and close monitoring of electrolytes. #2 Acute Renal Insufficiency: A She is an acute increase in her serum creatinine level likely from dehydration associated with severe hyperglycemia. We'll monitor her renal function as she becomes hydrated. #3 Altered Mental Status: She is sleepier than usual, but currently is oriented x3. We will cautiously administer IV fluids so as not to exacerbate her CNS symptoms. #4 Ischemic Cardiomyopathy: Clinically stable and we will monitor fluid status closely.  Raymonda Pell G 02/14/2013, 10:57 PM

## 2013-02-14 NOTE — ED Notes (Signed)
CBG: 314  

## 2013-02-14 NOTE — ED Provider Notes (Signed)
CSN: [id number]     Arrival date & time 02/14/13  1752 History   First MD Initiated Contact with Patient 02/14/13 1755     Chief Complaint  Patient presents with  . Hyperglycemia   (Consider location/radiation/quality/duration/timing/severity/associated sxs/prior Treatment) HPI  [name] is a 52 y.o. female with a history of insulin-dependent DM, HTN, MI, and CVA who presents to the ED with lethargy (so weak she cannot hold up a spoon)) and elevated blood glucose level. Patient's mother is present who provides additional historical information since patient is lethargic and slow to answer questions. Patient developed lethargy yesterday and feeling 'bad' that has persisted. Mom check BS this AM and it was 600. She gave the patient her insulin and tried to get it to come down throughout today. Mom says this will usually help get her out of the AMS but patient has not improved today. Patient endorses abdominal pain, nausea, and vomiting once this AM. She has not had much to eat or drink today. No fever or chills. Patient denies HA, cough, SOB, chest pain, palpitations, urinary frequency, dsyuria, hematuria, diarrhea, constipation, weakness, visual problems, syncope, or trauma. Patient denies drug or alcohol use. She states that she has not missed any doses of her meds.  Past Medical History  Diagnosis Date  . Hypertension   . Vitamin D deficiency   . Coronary artery disease     NSTEMI 12/11. Cardiac catheterization (12/11) showing LAD totalled in the midportion, diagonal 70% stenosed (small 2-mm vessel), circumflex nonobstructive, OM1 40%, OM2 30%, RCA samll and no significat disease.  LAD occlusion was calcified and appeared chronic at the time of cathetrization so no intervention.  . Ischemic cardiomyopathy     Echo 12/11 with EF 30% and periapical akinesis, no significant MR. RV  looked normal. QRS is not wide on ECG.  Echo (1/12):  EF 40-45%, severe hypokinesis of the anteroseptal wall, apical  inferior wall, inferoseptal wall, and apex.  . Orthostatic hypotension     sycopal episode in 12/11 likely due to this.  Probably from combination of depressed cardiac out put and diabetic autonomic neuropathy.  . Diabetes mellitus     Insulin dependant Type I   . Hyperlipidemia   . Diabetic nephropathy   . Diabetic retinopathy   . Gastroparesis   . Depression   . Systolic heart failure   . Iron deficiency anemia   . Hx MRSA infection   . Osteopenia   . Esophagitis   . Heart attack   . Stroke May 2012    weakness right side - right hand and right arm the worst   Past Surgical History  Procedure Laterality Date  . Appendectomy  1980  . Tubal ligation  1987  . Cesarean section  1984  . Refractive surgery      x3   Family History  Problem Relation Age of Onset  . Pancreatic cancer Father   . Colon cancer Neg Hx   . Diabetes Maternal Grandfather    History  Substance Use Topics  . Smoking status: Never Smoker   . Smokeless tobacco: Never Used  . Alcohol Use: No   OB History   Grav Para Term Preterm Abortions TAB SAB Ect Mult Living                 Review of Systems  10 systems reviewed and found to be negative, except as noted in the HPI    Allergies  Erythromycin; Food; and Metoclopramide hcl  Home Medications   Current Outpatient Rx  Name  Route  Sig  Dispense  Refill  . ALPRAZolam (XANAX) 0.5 MG tablet   Oral   Take 0.5 mg by mouth daily as needed.          [name] aspirin EC 81 MG tablet   Oral   Take 81 mg by mouth at bedtime.          [name] CALCIUM PO   Oral   Take 500 mg by mouth daily.         . carvedilol (COREG) 6.25 MG tablet   Oral   Take 1 tablet (6.25 mg total) by mouth 2 (two) times daily.   60 tablet   6   . citalopram (CELEXA) 20 MG tablet   Oral   Take 20 mg by mouth daily.           . clopidogrel (PLAVIX) 75 MG tablet   Oral   Take 1 tablet (75 mg total) by mouth daily.   30 tablet   6   . digoxin (LANOXIN) 0.125 MG  tablet      1/2 every other day   15 tablet   6   . ferrous sulfate 325 (65 FE) MG tablet   Oral   Take 325 mg by mouth 2 (two) times daily.          . furosemide (LASIX) 20 MG tablet   Oral   Take 1 tablet (20 mg total) by mouth daily.   30 tablet   6   . insulin aspart (NOVOLOG) 100 UNIT/ML injection   Subcutaneous   Inject 2-3 Units into the skin 3 (three) times daily before meals. Sliding scale         . insulin glargine (LANTUS) 100 UNIT/ML injection   Subcutaneous   Inject 8 Units into the skin as directed.          . Multiple Vitamins-Iron (QC DAILY MULTIVITAMINS/IRON) TABS   Oral   Take 1 tablet by mouth daily.           . rosuvastatin (CRESTOR) 40 MG tablet   Oral   Take 40 mg by mouth daily.    30 tablet   11   . Vitamin D, Ergocalciferol, (DRISDOL) 50000 UNITS CAPS   Oral   Take 50,000 Units by mouth every 7 (seven) days. Saturday          BP 119/63  Pulse 90  Temp(Src) 98.7 F (37.1 C) (Rectal)  Resp 16  Ht 5\' 4"  (1.626 m)  Wt 160 lb (72.576 kg)  BMI 27.45 kg/m2  SpO2 98% Physical Exam  Nursing note and vitals reviewed. Constitutional: She is oriented to person, place, and time. She appears well-developed and well-nourished. No distress.  HENT:  Head: Normocephalic.  Very dry MM  Eyes: Conjunctivae and EOM are normal.  Cardiovascular: Normal rate.   Pulmonary/Chest: Effort normal and breath sounds normal. No stridor. No respiratory distress. She has no wheezes. She has no rales. She exhibits no tenderness.  Musculoskeletal: Normal range of motion.  Neurological: She is oriented to person, place, and time.  Is responsive to verbal stimuli, ANO x3, neuro exam is nonfocal.  Psychiatric: She has a normal mood and affect.    ED Course  Procedures (including critical care time) Labs Review Labs Reviewed  CBC WITH DIFFERENTIAL - Abnormal; Notable for the following:    WBC 15.7 (*)    Hemoglobin 15.7 (*)    Neutrophils  Relative % 81  (*)    Neutro Abs 12.6 (*)    All other components within normal limits  BASIC METABOLIC PANEL - Abnormal; Notable for the following:    Glucose, Bld 303 (*)    BUN 49 (*)    Creatinine, Ser 1.71 (*)    Calcium 10.7 (*)    GFR calc non Af Amer 33 (*)    GFR calc Af Amer 38 (*)    All other components within normal limits  SALICYLATE LEVEL - Abnormal; Notable for the following:    Salicylate Lvl <2.0 (*)    All other components within normal limits  HEPATIC FUNCTION PANEL  AMMONIA  ACETAMINOPHEN LEVEL  URINE RAPID DRUG SCREEN (HOSP PERFORMED)  URINALYSIS, ROUTINE W REFLEX MICROSCOPIC  TROPONIN I  CG4 I-STAT (LACTIC ACID)   Imaging Review Ct Head Wo Contrast  02/14/2013   CLINICAL DATA:  Headache, hyperglycemia  EXAM: CT HEAD WITHOUT CONTRAST  TECHNIQUE: Contiguous axial images were obtained from the base of the skull through the vertex without intravenous contrast.  COMPARISON:  03/21/2011  FINDINGS: No skull fracture is noted. Paranasal sinuses and mastoid air cells are unremarkable. Moderate cerebral atrophy. No intracranial hemorrhage, mass effect or midline shift. Ventricular size is stable from prior exam. No acute cortical infarction. No mass lesion is noted on this unenhanced scan.  IMPRESSION: No acute intracranial abnormality. Moderate cerebral atrophy.   Electronically Signed   By: Natasha Mead M.D.   On: 02/14/2013 19:43   Dg Chest Port 1 View  02/14/2013   CLINICAL DATA:  Altered mental status.  EXAM: PORTABLE CHEST - 1 VIEW  COMPARISON:  May 02, 2012.  FINDINGS: The heart size and mediastinal contours are within normal limits. Both lungs are clear. Old right rib fracture is again noted.  IMPRESSION: No active disease.   Electronically Signed   By: Roque Lias M.D.   On: 02/14/2013 19:12    EKG Interpretation   None       MDM   1. Lethargy   2. Neutrophilic leukocytosis      Filed Vitals:   02/14/13 1804 02/14/13 1947  BP: 138/86 119/63  Pulse: 86 90   Temp: 98.4 F (36.9 C) 98.7 F (37.1 C)  TempSrc: Oral Rectal  Resp: 24 16  Height: 5\' 4"  (1.626 m)   Weight: 160 lb (72.576 kg)   SpO2: 99% 98%     [name] is a 52 y.o. female with past medical history significant for insulin-dependent diabetes, CAD, CVA presenting with lethargy. Patient has been lethargic for the last 48 hours. As per mother,  this is normally how she gets with hyperglycemia however the hyperglycemia has resolved and patient has not become more alert. Neuro exam is nonfocal. Patient is following commands and is oriented x3. She is afebrile, she states that she feels weak but has no pain. Blood sugar is normal at 303. She has normal bicarb. She does have a leukocytosis of 15.3 with a neutrophilia. Head CT and chest x-ray are negative. Patient appears clinically dehydrated. Her creatinine is elevated at 1.7, this is new for her and she has not had abnormal creatinines in the past. Patient was given 500 cc bolus and gently hydrated at 100 cc/hour. She has cardiomyopathy and I do not want to volume overload her.  Case signed out to PA Dammen at shift change: Plan is to followup urinalysis and urine drug screen. Plan is to admit her to her primary care  physician Dr. Evlyn Kanner of [facility] for altered mental status even if all results come back within normal limits.  Medications  sodium chloride 0.9 % bolus 500 mL (0 mLs Intravenous Stopped 02/14/13 1950)  0.9 %  sodium chloride infusion ( Intravenous Stopped 02/14/13 1950)    Note: Portions of this report may have been transcribed using voice recognition software. Every effort was made to ensure accuracy; however, inadvertent computerized transcription errors may be present      Wynetta Emery, PA-C 02/15/13 [id number]

## 2013-02-14 NOTE — ED Notes (Signed)
Per patient her BG has been up, meter at home read 606 at some point her mother told her. Denies pain, just feels weak and nauseated. BG by EMS was 238

## 2013-02-14 NOTE — ED Notes (Signed)
CBG 258 

## 2013-02-14 NOTE — ED Provider Notes (Signed)
[name] S 8:00 PM patient discussed in sign out. Patient with history of diabetes with elevated blood sugars at home and 2 days of increased general fatigue and nausea. Patient without much he gained yesterday or today. Sugar at home was 606 and was given insulin by her mother prior to arrival.   On arrival patient had blood sugar of 303 with anion gap of 20 on initial blood tests. Urinalysis is still pending.  No signs of UTI. There are significant ketones in the urine. Given patient's elevated sugar and continued fatigue will discuss the case with Dr. Evlyn Kanner.  Spoke with Dr. Jarold Motto on-call for Dr. Evlyn Kanner. He will plan to see patient and admitted.  Angus Seller, PA-C 02/14/13 2122

## 2013-02-14 NOTE — ED Notes (Signed)
Patient returned from CT

## 2013-02-14 NOTE — ED Notes (Signed)
Patient transported to CT 

## 2013-02-14 NOTE — ED Provider Notes (Signed)
Pt presents with her mother who is her primary caregiver. Pt has AODM, stroke, MI. Her CBG today was 600 and mother gave her insulin however the patient has not come around like she normally does. MOP is unaware of fever. She states normally the patient can dress herself.   PCP Dr Evlyn Kanner.  Pt groaning, (nurses getting ready to do cath UA), moving all her arms and legs well.   Medical screening examination/treatment/procedure(s) were conducted as a shared visit with non-physician practitioner(s) and myself.  I personally evaluated the patient during the encounter.  EKG Interpretation   None       Date: 02/14/2013  Rate: 88  Rhythm: normal sinus rhythm  QRS Axis: normal  Intervals: PR shortened  ST/T Wave abnormalities: nonspecific ST changes  Conduction Disutrbances:none  Narrative Interpretation: Q waves septally  Old EKG Reviewed: unchanged from 02/14/2013    Devoria Albe, MD, Armando Gang   Ward Givens, MD 02/14/13 2002

## 2013-02-14 NOTE — ED Notes (Signed)
Mother arrived; stated she slept all day yesterday and this morning she was difficult to arouse. She checked her BG and it was 606; she has given her insulin all day looking for improvement, but she continued to be lethargic at home. When she wouldn't hold her spoon for supper, she decided to bring her in. Mother says the slowness to answer questions is her normal, but what is not normal is how lethargic, yet restless she is today.

## 2013-02-14 NOTE — ED Provider Notes (Signed)
Medical screening examination/treatment/procedure(s) were performed by non-physician practitioner and as supervising physician I was immediately available for consultation/collaboration.      Shylah Dossantos B. Byrdie Miyazaki, MD 02/14/13 2137 

## 2013-02-14 NOTE — ED Notes (Signed)
Internal Medicine, MD at bedside. 

## 2013-02-15 LAB — BASIC METABOLIC PANEL WITH GFR
BUN: 47 mg/dL — ABNORMAL HIGH (ref 6–23)
BUN: 49 mg/dL — ABNORMAL HIGH (ref 6–23)
BUN: 49 mg/dL — ABNORMAL HIGH (ref 6–23)
CO2: 16 meq/L — ABNORMAL LOW (ref 19–32)
CO2: 17 meq/L — ABNORMAL LOW (ref 19–32)
CO2: 19 meq/L (ref 19–32)
Calcium: 10 mg/dL (ref 8.4–10.5)
Calcium: 10.1 mg/dL (ref 8.4–10.5)
Calcium: 9.9 mg/dL (ref 8.4–10.5)
Chloride: 103 meq/L (ref 96–112)
Chloride: 105 meq/L (ref 96–112)
Chloride: 109 meq/L (ref 96–112)
Creatinine, Ser: 1.45 mg/dL — ABNORMAL HIGH (ref 0.50–1.10)
Creatinine, Ser: 1.57 mg/dL — ABNORMAL HIGH (ref 0.50–1.10)
Creatinine, Ser: 1.58 mg/dL — ABNORMAL HIGH (ref 0.50–1.10)
GFR calc Af Amer: 42 mL/min — ABNORMAL LOW
GFR calc Af Amer: 42 mL/min — ABNORMAL LOW
GFR calc Af Amer: 46 mL/min — ABNORMAL LOW
GFR calc non Af Amer: 36 mL/min — ABNORMAL LOW
GFR calc non Af Amer: 36 mL/min — ABNORMAL LOW
GFR calc non Af Amer: 40 mL/min — ABNORMAL LOW
Glucose, Bld: 307 mg/dL — ABNORMAL HIGH (ref 70–99)
Glucose, Bld: 436 mg/dL — ABNORMAL HIGH (ref 70–99)
Glucose, Bld: 448 mg/dL — ABNORMAL HIGH (ref 70–99)
Potassium: 4.3 meq/L (ref 3.5–5.1)
Potassium: 4.5 meq/L (ref 3.5–5.1)
Potassium: 4.7 meq/L (ref 3.5–5.1)
Sodium: 143 meq/L (ref 135–145)
Sodium: 144 meq/L (ref 135–145)
Sodium: 145 meq/L (ref 135–145)

## 2013-02-15 LAB — BASIC METABOLIC PANEL
BUN: 41 mg/dL — ABNORMAL HIGH (ref 6–23)
CO2: 25 mEq/L (ref 19–32)
Chloride: 110 mEq/L (ref 96–112)
GFR calc Af Amer: 48 mL/min — ABNORMAL LOW (ref >90)
Glucose, Bld: 129 mg/dL — ABNORMAL HIGH (ref 70–99)
Potassium: 4.2 mEq/L (ref 3.5–5.1)
Sodium: 144 mEq/L (ref 135–145)

## 2013-02-15 LAB — GLUCOSE, CAPILLARY
Glucose-Capillary: 378 mg/dL — ABNORMAL HIGH (ref 70–99)
Glucose-Capillary: 288 mg/dL — ABNORMAL HIGH (ref 70–99)
Glucose-Capillary: 191 mg/dL — ABNORMAL HIGH (ref 70–99)
Glucose-Capillary: 179 mg/dL — ABNORMAL HIGH (ref 70–99)
Glucose-Capillary: 157 mg/dL — ABNORMAL HIGH (ref 70–99)
Glucose-Capillary: 120 mg/dL — ABNORMAL HIGH (ref 70–99)
Glucose-Capillary: 125 mg/dL — ABNORMAL HIGH (ref 70–99)
Glucose-Capillary: 378 mg/dL — ABNORMAL HIGH (ref 70–99)

## 2013-02-15 LAB — CBC
HCT: 43.6 % (ref 36.0–46.0)
Hemoglobin: 15.2 g/dL — ABNORMAL HIGH (ref 12.0–15.0)
MCH: 31 pg (ref 26.0–34.0)
MCHC: 34.9 g/dL (ref 30.0–36.0)
MCV: 88.8 fL (ref 78.0–100.0)
Platelets: 210 10*3/uL (ref 150–400)
RBC: 4.91 MIL/uL (ref 3.87–5.11)
RDW: 14 % (ref 11.5–15.5)
WBC: 19.1 10*3/uL — ABNORMAL HIGH (ref 4.0–10.5)

## 2013-02-15 LAB — MRSA PCR SCREENING: MRSA by PCR: NEGATIVE

## 2013-02-15 MED ORDER — INSULIN ASPART 100 UNIT/ML ~~LOC~~ SOLN
0.0000 [IU] | Freq: Every day | SUBCUTANEOUS | Status: DC
  Administered 2013-02-16: 2 [IU] via SUBCUTANEOUS

## 2013-02-15 MED ORDER — INSULIN GLARGINE 100 UNIT/ML ~~LOC~~ SOLN
15.0000 [IU] | Freq: Every morning | SUBCUTANEOUS | Status: DC
  Administered 2013-02-15: 15 [IU] via SUBCUTANEOUS
  Filled 2013-02-15 (×2): qty 0.15

## 2013-02-15 MED ORDER — INSULIN ASPART 100 UNIT/ML ~~LOC~~ SOLN
0.0000 [IU] | Freq: Three times a day (TID) | SUBCUTANEOUS | Status: DC
  Administered 2013-02-15: 15 [IU] via SUBCUTANEOUS
  Administered 2013-02-16: 11 [IU] via SUBCUTANEOUS
  Administered 2013-02-16: 5 [IU] via SUBCUTANEOUS
  Administered 2013-02-16: 15 [IU] via SUBCUTANEOUS
  Administered 2013-02-17: 5 [IU] via SUBCUTANEOUS
  Administered 2013-02-17: 8 [IU] via SUBCUTANEOUS

## 2013-02-15 NOTE — Progress Notes (Signed)
Subjective: Feeling better, but not back to her normal, ate a small amount for breakfast, no dyspnea  Objective: Vital signs in last 24 hours: Temp:  [98.3 F (36.8 C)-100.1 F (37.8 C)] 100.1 F (37.8 C) (10/26 0353) Pulse Rate:  [76-95] 78 (10/26 0938) Resp:  [13-24] 14 (10/26 0739) BP: (118-153)/(57-98) 118/57 mmHg (10/26 0739) SpO2:  [96 %-100 %] 97 % (10/26 0353) Weight:  [68.4 kg (150 lb 12.7 oz)-72.576 kg (160 lb)] 68.4 kg (150 lb 12.7 oz) (10/25 2354) Weight change:    Intake/Output from previous day: 10/25 0701 - 10/26 0700 In: 1031.2 [I.V.:631.2; IV Piggyback:400] Out: -    General appearance: alert, cooperative and no distress Resp: clear to auscultation bilaterally Cardio: regular rate and rhythm GI: soft, non-tender; bowel sounds normal; no masses,  no organomegaly Extremities: extremities normal, atraumatic, no cyanosis or edema  Lab Results:  Recent Labs  02/14/13 0015 02/14/13 1846  WBC 19.1* 15.7*  HGB 15.2* 15.7*  HCT 43.6 44.4  PLT 210 238   BMET  Recent Labs  02/15/13 0145 02/15/13 0350  NA 145 144  K 4.3 4.5  CL 105 109  CO2 16* 19  GLUCOSE 436* 307*  BUN 49* 47*  CREATININE 1.57* 1.45*  CALCIUM 9.9 10.0   CMET CMP     Component Value Date/Time   NA 144 02/15/2013 0350   K 4.5 02/15/2013 0350   CL 109 02/15/2013 0350   CO2 19 02/15/2013 0350   GLUCOSE 307* 02/15/2013 0350   BUN 47* 02/15/2013 0350   CREATININE 1.45* 02/15/2013 0350   CALCIUM 10.0 02/15/2013 0350   PROT 8.3 02/14/2013 1846   ALBUMIN 4.0 02/14/2013 1846   AST 15 02/14/2013 1846   ALT 18 02/14/2013 1846   ALKPHOS 90 02/14/2013 1846   BILITOT 0.6 02/14/2013 1846   GFRNONAA 40* 02/15/2013 0350   GFRAA 46* 02/15/2013 0350    CBG (last 3)   Recent Labs  02/15/13 0558 02/15/13 0710 02/15/13 0825  GLUCAP 191* 179* 157*    INR RESULTS:   Lab Results  Component Value Date   INR 1.09 03/27/2010     Studies/Results: Ct Head Wo Contrast  02/14/2013    CLINICAL DATA:  Headache, hyperglycemia  EXAM: CT HEAD WITHOUT CONTRAST  TECHNIQUE: Contiguous axial images were obtained from the base of the skull through the vertex without intravenous contrast.  COMPARISON:  03/21/2011  FINDINGS: No skull fracture is noted. Paranasal sinuses and mastoid air cells are unremarkable. Moderate cerebral atrophy. No intracranial hemorrhage, mass effect or midline shift. Ventricular size is stable from prior exam. No acute cortical infarction. No mass lesion is noted on this unenhanced scan.  IMPRESSION: No acute intracranial abnormality. Moderate cerebral atrophy.   Electronically Signed   By: Natasha Mead M.D.   On: 02/14/2013 19:43   Dg Chest Port 1 View  02/14/2013   CLINICAL DATA:  Altered mental status.  EXAM: PORTABLE CHEST - 1 VIEW  COMPARISON:  May 02, 2012.  FINDINGS: The heart size and mediastinal contours are within normal limits. Both lungs are clear. Old right rib fracture is again noted.  IMPRESSION: No active disease.   Electronically Signed   By: Roque Lias M.D.   On: 02/14/2013 19:12    Medications: I have reviewed the patient's current medications.  Assessment/Plan: #1 Altered Mental Status: improving with rehydration #2 DKA: slowly improving with IVF and insulin, but still has an anion gap. Will switch off of IV insulin when gap has  normalized. #3 CAD and Ischemic Cardiomyopathy: stable with no signs or sxs of CHF   LOS: 1 day   [name] 02/15/2013, 10:18 AM

## 2013-02-15 NOTE — ED Provider Notes (Signed)
See prior note   Ward Givens, MD 02/15/13 1101

## 2013-02-16 LAB — GLUCOSE, CAPILLARY
Glucose-Capillary: 314 mg/dL — ABNORMAL HIGH (ref 70–99)
Glucose-Capillary: 352 mg/dL — ABNORMAL HIGH (ref 70–99)
Glucose-Capillary: 216 mg/dL — ABNORMAL HIGH (ref 70–99)
Glucose-Capillary: 203 mg/dL — ABNORMAL HIGH (ref 70–99)
Glucose-Capillary: 225 mg/dL — ABNORMAL HIGH (ref 70–99)

## 2013-02-16 LAB — POCT I-STAT, CHEM 8
Chloride: 109 mEq/L (ref 96–112)
Glucose, Bld: 290 mg/dL — ABNORMAL HIGH (ref 70–99)
HCT: 45 % (ref 36.0–46.0)
Hemoglobin: 15.3 g/dL — ABNORMAL HIGH (ref 12.0–15.0)
Potassium: 4.3 mEq/L (ref 3.5–5.1)
Sodium: 146 mEq/L — ABNORMAL HIGH (ref 135–145)
TCO2: 21 mmol/L (ref 0–100)

## 2013-02-16 MED ORDER — INSULIN GLARGINE 100 UNIT/ML ~~LOC~~ SOLN
20.0000 [IU] | Freq: Every morning | SUBCUTANEOUS | Status: DC
  Administered 2013-02-16 – 2013-02-17 (×2): 20 [IU] via SUBCUTANEOUS
  Filled 2013-02-16 (×2): qty 0.2

## 2013-02-16 MED ORDER — SODIUM CHLORIDE 0.9 % IV SOLN
INTRAVENOUS | Status: DC
  Administered 2013-02-16 – 2013-02-17 (×2): via INTRAVENOUS

## 2013-02-16 NOTE — Clinical Documentation Improvement (Signed)
THIS DOCUMENT IS NOT A PERMANENT PART OF THE MEDICAL RECORD  Please update your documentation with the medical record to reflect your response to this query. If you need help knowing how to do this please call [phone].  02/16/13  Dear Dr. Adrian Prince,   In a better effort to capture your patient's severity of illness, reflect appropriate length of stay and utilization of resources, a review of the patient medical record has revealed the following indicators.    Based on your clinical judgment, please clarify and document in a progress note and/or discharge summary the clinical condition associated with the following supporting information:  You may use possible, probable, or suspect with inpatient documentation. possible, probable, suspected diagnoses MUST be documented at the time of discharge   In responding to this query please exercise your independent judgment.  The fact that a query is asked, does not imply that any particular answer is desired or expected.  Possible Clinical Conditions?  _______Acute Renal Failure _______Acute Kidney Injury _______Acute on Chronic Renal Failure _______Other Condition_____________ _______Cannot Clinically Determine     Supporting Information:  Risk Factors:   Signs and Symptoms: per 10/15/ 14 H&P 2 Acute Renal Insufficiency: A She is an acute increase in her serum creatinine level likely from dehydration associated with severe hyperglycemia. We'll monitor her renal function as she becomes hydrated.     Diagnostics:  Lab:  BUN 49 (*) 6 - 23 mg/dL , Creatinine, Ser 0.98 (*) 0.50 - 1.10 mg/dL , GFR calc non Af Amer 33 (*) >90 mL/min GFR calc Af Amer 38 (*) >90 mL/min                     Reviewed:  no additional documentation provided, MD did not answer query.    02/18/13 [facility]  Thank You,  Shelda Pal RN, BSN, CCM  Clinical Documentation Specialist: [phone] Health Information Management [place]

## 2013-02-16 NOTE — Progress Notes (Signed)
CALLED REPORT TO [facility] ON 6 NORTH. QUESTIONS ANSWERED.

## 2013-02-16 NOTE — Progress Notes (Signed)
Subjective: Feels a lot better. Eating well. No N/V. No pain. Feels :"bad to normal" mentally. Still weak. No ambulation yet  Objective: Vital signs in last 24 hours: Temp:  [97.9 F (36.6 C)-98.9 F (37.2 C)] 97.9 F (36.6 C) (10/27 0750) Pulse Rate:  [72-80] 72 (10/27 0750) Resp:  [15-20] 19 (10/27 0750) BP: (96-135)/(42-59) 135/52 mmHg (10/27 0750) SpO2:  [94 %-100 %] 95 % (10/27 0750)  Intake/Output from previous day: 10/26 0701 - 10/27 0700 In: 470 [P.O.:470] Out: 1175 [Urine:1175] Intake/Output this shift:      Lab Results   Recent Labs  02/14/13 0015 02/14/13 1846  WBC 19.1* 15.7*  RBC 4.91 5.08  HGB 15.2* 15.7*  HCT 43.6 44.4  MCV 88.8 87.4  MCH 31.0 30.9  RDW 14.0 13.7  PLT 210 238    Recent Labs  02/15/13 0350 02/15/13 0942  NA 144 144  K 4.5 4.2  CL 109 110  CO2 19 25  GLUCOSE 307* 129*  BUN 47* 41*  CREATININE 1.45* 1.41*  CALCIUM 10.0 9.8    Studies/Results: Ct Head Wo Contrast  02/14/2013   CLINICAL DATA:  Headache, hyperglycemia  EXAM: CT HEAD WITHOUT CONTRAST  TECHNIQUE: Contiguous axial images were obtained from the base of the skull through the vertex without intravenous contrast.  COMPARISON:  03/21/2011  FINDINGS: No skull fracture is noted. Paranasal sinuses and mastoid air cells are unremarkable. Moderate cerebral atrophy. No intracranial hemorrhage, mass effect or midline shift. Ventricular size is stable from prior exam. No acute cortical infarction. No mass lesion is noted on this unenhanced scan.  IMPRESSION: No acute intracranial abnormality. Moderate cerebral atrophy.   Electronically Signed   By: Natasha Mead M.D.   On: 02/14/2013 19:43   Dg Chest Port 1 View  02/14/2013   CLINICAL DATA:  Altered mental status.  EXAM: PORTABLE CHEST - 1 VIEW  COMPARISON:  May 02, 2012.  FINDINGS: The heart size and mediastinal contours are within normal limits. Both lungs are clear. Old right rib fracture is again noted.  IMPRESSION: No active  disease.   Electronically Signed   By: Roque Lias M.D.   On: 02/14/2013 19:12    Scheduled Meds: . aspirin EC  81 mg Oral QHS  . atorvastatin  40 mg Oral q1800  . carvedilol  6.25 mg Oral BID  . citalopram  20 mg Oral Daily  . clopidogrel  75 mg Oral Daily  . digoxin  0.0625 mg Oral Daily  . enoxaparin (LOVENOX) injection  40 mg Subcutaneous Q24H  . insulin aspart  0-15 Units Subcutaneous TID WC  . insulin aspart  0-5 Units Subcutaneous QHS  . insulin glargine  15 Units Subcutaneous q morning - 10a  . multivitamins with iron  1 tablet Oral Daily  . [START ON 02/21/2013] Vitamin D (Ergocalciferol)  50,000 Units Oral Q Sat   Continuous Infusions: . dextrose 5 % and 0.45% NaCl 100 mL/hr at 02/15/13 0457  . dextrose 5 % and 0.45% NaCl     PRN Meds:ALPRAZolam, dextrose  Assessment/Plan:  #1 Altered Mental Status/Prior CVA: improving with rehydration  #2 DKA: slowly improving with IVF and insulin, but still has an anion gap. Will switch off of IV insulin when gap has normalized.  #3 CAD and Ischemic Cardiomyopathy: stable with no signs or sxs of CHF  DM Nephropathy: Doing OK Hypertension: BP reasonable Hyperlipidemia: on Rx    LOS: 2 days   Kieara Schwark ALAN 02/16/2013, 8:28 AM

## 2013-02-16 NOTE — Progress Notes (Signed)
Patient ID: [name], female   DOB: July 19, 1960, 52 y.o.   MRN: [id number]  Subjective:  Feels a lot better. Eating well. No N/V. No pain. Feels :"bad to normal" mentally. Still weak. No ambulation yet  Objective:  Vital signs in last 24 hours:  Temp: [97.9 F (36.6 C)-98.9 F (37.2 C)] 97.9 F (36.6 C) (10/27 0750)  Pulse Rate: [72-80] 72 (10/27 0750)  Resp: [15-20] 19 (10/27 0750)  BP: (96-135)/(42-59) 135/52 mmHg (10/27 0750)  SpO2: [94 %-100 %] 95 % (10/27 0750)  Intake/Output from previous day:  10/26 0701 - 10/27 0700  In: 470 [P.O.:470]  Out: 1175 [Urine:1175]  Intake/Output this shift:  Sitting up in no distress. Face symmetric. Oral membranes moist neck supple lungs clear ht regular with murmur abd soft NT extrems pulses intact no edema. Awake, alert mentating well, clear speech  Lab Results   Recent Labs   02/14/13 0015  02/14/13 1846   WBC  19.1*  15.7*   RBC  4.91  5.08   HGB  15.2*  15.7*   HCT  43.6  44.4   MCV  88.8  87.4   MCH  31.0  30.9   RDW  14.0  13.7   PLT  210  238     Recent Labs   02/15/13 0350  02/15/13 0942   NA  144  144   K  4.5  4.2   CL  109  110   CO2  19  25   GLUCOSE  307*  129*   BUN  47*  41*   CREATININE  1.45*  1.41*   CALCIUM  10.0  9.8    Studies/Results:  Ct Head Wo Contrast  02/14/2013 CLINICAL DATA: Headache, hyperglycemia EXAM: CT HEAD WITHOUT CONTRAST TECHNIQUE: Contiguous axial images were obtained from the base of the skull through the vertex without intravenous contrast. COMPARISON: 03/21/2011 FINDINGS: No skull fracture is noted. Paranasal sinuses and mastoid air cells are unremarkable. Moderate cerebral atrophy. No intracranial hemorrhage, mass effect or midline shift. Ventricular size is stable from prior exam. No acute cortical infarction. No mass lesion is noted on this unenhanced scan. IMPRESSION: No acute intracranial abnormality. Moderate cerebral atrophy. Electronically Signed By: Natasha Mead M.D. On: 02/14/2013  19:43  Dg Chest Port 1 View  02/14/2013 CLINICAL DATA: Altered mental status. EXAM: PORTABLE CHEST - 1 VIEW COMPARISON: May 02, 2012. FINDINGS: The heart size and mediastinal contours are within normal limits. Both lungs are clear. Old right rib fracture is again noted. IMPRESSION: No active disease. Electronically Signed By: Roque Lias M.D. On: 02/14/2013 19:12   Scheduled Meds:  .  aspirin EC  81 mg  Oral  QHS   .  atorvastatin  40 mg  Oral  q1800   .  carvedilol  6.25 mg  Oral  BID   .  citalopram  20 mg  Oral  Daily   .  clopidogrel  75 mg  Oral  Daily   .  digoxin  0.0625 mg  Oral  Daily   .  enoxaparin (LOVENOX) injection  40 mg  Subcutaneous  Q24H   .  insulin aspart  0-15 Units  Subcutaneous  TID WC   .  insulin aspart  0-5 Units  Subcutaneous  QHS   .  insulin glargine  15 Units  Subcutaneous  q morning - 10a   .  multivitamins with iron  1 tablet  Oral  Daily   .  [START  ON 02/21/2013] Vitamin D (Ergocalciferol)  50,000 Units  Oral  Q Sat    Continuous Infusions:  .  dextrose 5 % and 0.45% NaCl  100 mL/hr at 02/15/13 0457   .  dextrose 5 % and 0.45% NaCl     PRN Meds:ALPRAZolam, dextrose  Assessment/Plan:  #1 Altered Mental Status/Prior CVA: improving with rehydration  #2 DKA: slowly improving with IVF and insulin, but still has an anion gap. Will switch off of IV insulin when gap has normalized.  #3 CAD and Ischemic Cardiomyopathy: stable with no signs or sxs of CHF  DM Nephropathy: Doing OK  Hypertension: BP reasonable  Hyperlipidemia: on Rx  LOS: 2 days  Everett Ehrler ALAN  02/16/2013, 8:28 AM

## 2013-02-16 NOTE — Care Management Note (Signed)
    Page 1 of 1   02/16/2013     10:23:58 AM   CARE MANAGEMENT NOTE 02/16/2013  Patient:  [name], [name]   Account Number:  [id number]  Date Initiated:  02/16/2013  Documentation initiated by:  Junius Creamer  Subjective/Objective Assessment:   adm w hyperglycemia     Action/Plan:   lives w husband, pcp dr Laurene Footman   Anticipated DC Date:     Anticipated DC Plan:  HOME/SELF CARE      DC Planning Services  CM consult      Choice offered to / List presented to:             Status of service:   Medicare Important Message given?   (If response is "NO", the following Medicare IM given date fields will be blank) Date Medicare IM given:   Date Additional Medicare IM given:    Discharge Disposition:    Per UR Regulation:  Reviewed for med. necessity/level of care/duration of stay  If discussed at Long Length of Stay Meetings, dates discussed:    Comments:

## 2013-02-16 NOTE — Progress Notes (Signed)
Physical Therapy Treatment Patient Details Name: [name] MRN: [id number] DOB: 1960/10/23 Today's Date: 02/16/2013 Time: 8295-6213 PT Time Calculation (min): 23 min  PT Assessment / Plan / Recommendation  History of Present Illness Pt is a 52 y/o female presenting with altered mental status, admitted for DKA.    PT Comments   This patient demonstrated gross strength of 4/5 to 4+/5 bilaterally, but has balance difficulties when ambulating with the RW. She was able to stand at the counter and gather her things without LOB, but still appeared unsteady. This patient would be appropriate for skilled PT interventions in the acute setting, with HHPT to follow at d/c.   Follow Up Recommendations  Home health PT     Does the patient have the potential to tolerate intense rehabilitation     Barriers to Discharge   Unclear how much assistance pt's mother is able to provide at home    Equipment Recommendations  None recommended by PT    Recommendations for Other Services    Frequency Min 3X/week   Progress towards PT Goals    Plan      Precautions / Restrictions Precautions Precautions: Fall Restrictions Weight Bearing Restrictions: No   Pertinent Vitals/Pain Pt reports no pain throughout session.    Mobility  Bed Mobility Bed Mobility: Not assessed Details for Bed Mobility Assistance: Pt received up in recliner Transfers Transfers: Sit to Stand;Stand to Sit Sit to Stand: 4: Min guard;From chair/3-in-1;With upper extremity assist Stand to Sit: 4: Min guard;To chair/3-in-1;With upper extremity assist Details for Transfer Assistance: VC's for safety awareness and hand placement on seated surface. Ambulation/Gait Ambulation/Gait Assistance: 4: Min guard;4: Min Environmental consultant (Feet): 175 Feet Assistive device: Rolling walker Ambulation/Gait Assistance Details: VC's for sequencing with the walker and safety awareness, as pt frequently took her hands off the walker during  gait training and was somewhat unsteady. Occasionally required min assist to prevent LOB. Gait Pattern: Step-through pattern;Decreased stride length Gait velocity: decreased Stairs: No    Exercises     PT Diagnosis: Difficulty walking  PT Problem List: Decreased strength;Decreased range of motion;Decreased activity tolerance;Decreased balance;Decreased mobility;Decreased knowledge of use of DME;Decreased safety awareness PT Treatment Interventions: DME instruction;Gait training;Stair training;Functional mobility training;Therapeutic exercise;Therapeutic activities;Neuromuscular re-education;Patient/family education   PT Goals (current goals can now be found in the care plan section) Acute Rehab PT Goals Patient Stated Goal: To return home with mother PT Goal Formulation: With patient Time For Goal Achievement: 02/23/13 Potential to Achieve Goals: Good  Visit Information  Last PT Received On: 02/16/13 Assistance Needed: +1 History of Present Illness: Pt is a 52 y/o female presenting with altered mental status, admitted for DKA.     Subjective Data  Patient Stated Goal: To return home with mother   Cognition  Cognition Arousal/Alertness: Awake/alert Behavior During Therapy: WFL for tasks assessed/performed Overall Cognitive Status: Within Functional Limits for tasks assessed    Balance  Balance Balance Assessed: Yes Static Sitting Balance Static Sitting - Balance Support: Feet supported;Bilateral upper extremity supported Static Sitting - Level of Assistance: 5: Stand by assistance Static Sitting - Comment/# of Minutes: 2 Dynamic Standing Balance Dynamic Standing - Balance Support: No upper extremity supported Dynamic Standing - Level of Assistance: 4: Min assist Dynamic Standing - Comments: 30 seconds while pt gathered belongings at counter top without holding the walker  End of Session PT - End of Session Equipment Utilized During Treatment: Gait belt Activity Tolerance:  Patient tolerated treatment well Patient left: in chair;with  call bell/phone within reach Nurse Communication: Mobility status   GP     Ruthann Cancer 02/16/2013, 4:03 PM  Ruthann Cancer, PT, DPT Acute Rehabilitation Services [phone]

## 2013-02-17 DIAGNOSIS — F329 Major depressive disorder, single episode, unspecified: Secondary | ICD-10-CM | POA: Diagnosis present

## 2013-02-17 DIAGNOSIS — F32A Depression, unspecified: Secondary | ICD-10-CM | POA: Diagnosis present

## 2013-02-17 DIAGNOSIS — E1039 Type 1 diabetes mellitus with other diabetic ophthalmic complication: Secondary | ICD-10-CM | POA: Diagnosis present

## 2013-02-17 LAB — CBC
HCT: 41.3 % (ref 36.0–46.0)
Hemoglobin: 14.2 g/dL (ref 12.0–15.0)
MCH: 30.3 pg (ref 26.0–34.0)
MCHC: 34.4 g/dL (ref 30.0–36.0)
MCV: 88.1 fL (ref 78.0–100.0)
RBC: 4.69 MIL/uL (ref 3.87–5.11)

## 2013-02-17 LAB — BASIC METABOLIC PANEL
BUN: 13 mg/dL (ref 6–23)
CO2: 23 mEq/L (ref 19–32)
Chloride: 106 mEq/L (ref 96–112)
GFR calc Af Amer: 64 mL/min — ABNORMAL LOW (ref >90)
Glucose, Bld: 260 mg/dL — ABNORMAL HIGH (ref 70–99)
Potassium: 4.3 mEq/L (ref 3.5–5.1)
Sodium: 140 mEq/L (ref 135–145)

## 2013-02-17 LAB — GLUCOSE, CAPILLARY: Glucose-Capillary: 290 mg/dL — ABNORMAL HIGH (ref 70–99)

## 2013-02-17 MED ORDER — INSULIN GLARGINE 100 UNIT/ML ~~LOC~~ SOLN: 20.0000 [IU] | SUBCUTANEOUS | Status: DC

## 2013-02-17 NOTE — Progress Notes (Signed)
Physical Therapy Treatment Patient Details Name: KEITRA CARUSONE MRN: [id number] DOB: [birth year] Today's Date: 02/17/2013 Time: 4098-1191 PT Time Calculation (min): 27 min  PT Assessment / Plan / Recommendation  History of Present Illness Pt is a 52 y/o female presenting with altered mental status, admitted for DKA.    PT Comments   Patient demonstrates improvements in functional mobility today. Ambulated significantly increased distance with RW, and also performed ambulation without assistive device. Patient tolerated hygiene and counter activities without difficulty and was able to negotiate through various balance tasks without issue today. Discussed expectations for mobility upon discharge and recommended use of RW initially for stability. Patient in agreement. Will continue to see and progress activity as tolerated.  Follow Up Recommendations  Home health PT     Does the patient have the potential to tolerate intense rehabilitation     Barriers to Discharge        Equipment Recommendations  None recommended by PT    Recommendations for Other Services    Frequency Min 3X/week   Progress towards PT Goals Progress towards PT goals: Progressing toward goals  Plan Current plan remains appropriate    Precautions / Restrictions Precautions Precautions: Fall Restrictions Weight Bearing Restrictions: No   Pertinent Vitals/Pain Pt reports no pain or discomfort today    Mobility  Bed Mobility Bed Mobility: Supine to Sit;Sitting - Scoot to Edge of Bed Supine to Sit: 7: Independent Sitting - Scoot to [place] of Bed: 7: Independent Transfers Transfers: Sit to Stand;Stand to Sit Sit to Stand: 7: Independent Stand to Sit: 7: Independent Details for Transfer Assistance: No assist needed Ambulation/Gait Ambulation/Gait Assistance: 5: Supervision Ambulation Distance (Feet): 600 Feet Assistive device: Rolling walker Ambulation/Gait Assistance Details: Improved stability, able to ambulate 60  ft without an assistive device Gait Pattern: Step-through pattern;Decreased stride length Gait velocity: decreased    Exercises     PT Diagnosis:    PT Problem List:   PT Treatment Interventions:     PT Goals (current goals can now be found in the care plan section) Acute Rehab PT Goals Patient Stated Goal: To return home with mother PT Goal Formulation: With patient Time For Goal Achievement: 02/23/13 Potential to Achieve Goals: Good  Visit Information  Last PT Received On: 02/17/13 Assistance Needed: +1 History of Present Illness: Pt is a 52 y/o female presenting with altered mental status, admitted for DKA.     Subjective Data  Subjective: I feel better today Patient Stated Goal: To return home with mother   Cognition  Cognition Arousal/Alertness: Awake/alert Behavior During Therapy: WFL for tasks assessed/performed Overall Cognitive Status: Within Functional Limits for tasks assessed    Balance  Balance Balance Assessed: Yes Static Sitting Balance Static Sitting - Balance Support: Feet supported;Bilateral upper extremity supported Static Sitting - Level of Assistance: 5: Stand by assistance Dynamic Standing Balance Dynamic Standing - Balance Support: No upper extremity supported Dynamic Standing - Level of Assistance: 7: Independent Dynamic Standing - Comments: performing hygiene activities  End of Session PT - End of Session Equipment Utilized During Treatment: Gait belt Activity Tolerance: Patient tolerated treatment well Patient left: in chair;with call bell/phone within reach Nurse Communication: Mobility status   GP     Fabio Asa 02/17/2013, 9:28 AM Charlotte Crumb, PT DPT  [phone]

## 2013-02-17 NOTE — Progress Notes (Signed)
Discharge home. Home discharge instruction given, no question verbalized. 

## 2013-02-17 NOTE — Progress Notes (Signed)
Subjective: Had a good night. No chills or sweats. No pain. BS up in the 200's  Did well with therapy  Objective: Vital signs in last 24 hours: Temp:  [97.7 F (36.5 C)-98.9 F (37.2 C)] 98.1 F (36.7 C) (10/28 0534) Pulse Rate:  [67-79] 67 (10/28 0534) Resp:  [14-20] 18 (10/28 0534) BP: (136-146)/(58-70) 140/65 mmHg (10/28 0534) SpO2:  [97 %-100 %] 100 % (10/28 0534)  Intake/Output from previous day: 10/27 0701 - 10/28 0700 In: 959.2 [P.O.:240; I.V.:719.2] Out: 1000 [Urine:1000] Intake/Output this shift:    Sitting up in no distress. Face symmetric, anicteric, oral membranes moist. Neck supple. Lungs clear no wheeze, no Kussmaul. Ht regular with skips abd soft NT, extrems trace edema. Awake, alert, clear speech , sl right sided weakness at baseline  Lab Results   Recent Labs  02/14/13 1846 02/14/13 1848  WBC 15.7*  --   RBC 5.08  --   HGB 15.7* 15.3*  HCT 44.4 45.0  MCV 87.4  --   MCH 30.9  --   RDW 13.7  --   PLT 238  --     Recent Labs  02/15/13 0350 02/15/13 0942  NA 144 144  K 4.5 4.2  CL 109 110  CO2 19 25  GLUCOSE 307* 129*  BUN 47* 41*  CREATININE 1.45* 1.41*  CALCIUM 10.0 9.8    Studies/Results: No results found.  Scheduled Meds: . aspirin EC  81 mg Oral QHS  . atorvastatin  40 mg Oral q1800  . carvedilol  6.25 mg Oral BID  . citalopram  20 mg Oral Daily  . clopidogrel  75 mg Oral Daily  . digoxin  0.0625 mg Oral Daily  . enoxaparin (LOVENOX) injection  40 mg Subcutaneous Q24H  . insulin aspart  0-15 Units Subcutaneous TID WC  . insulin aspart  0-5 Units Subcutaneous QHS  . insulin glargine  20 Units Subcutaneous q morning - 10a  . multivitamins with iron  1 tablet Oral Daily  . [START ON 02/21/2013] Vitamin D (Ergocalciferol)  50,000 Units Oral Q Sat   Continuous Infusions: . sodium chloride 50 mL/hr at 02/16/13 1518   PRN Meds:ALPRAZolam  Assessment/Plan:  #1 Altered Mental Status/Prior CVA: back to baseline #2 DKA: resolved.  BS's still only fair #3 CAD and Ischemic Cardiomyopathy: stable with no signs or sxs of CHF  DM Nephropathy: Doing OK  Hypertension: BP reasonable  Hyperlipidemia: on Rx Depression,: doing OK    LOS: 3 days   Kamie Korber ALAN 02/17/2013, 8:01 AM

## 2013-02-17 NOTE — Progress Notes (Signed)
Inpatient Diabetes Program Recommendations  AACE/ADA: New Consensus Statement on Inpatient Glycemic Control (2013)  Target Ranges:  Prepandial:   less than 140 mg/dL      Peak postprandial:   less than 180 mg/dL (1-2 hours)      Critically ill patients:  140 - 180 mg/dL   Reason for Visit: Results for KELAIAH, ESCALONA (MRN [id number]) as of 02/17/2013 13:19  Ref. Range 02/16/2013 12:10 02/16/2013 17:34 02/16/2013 21:12 02/17/2013 07:33 02/17/2013 12:09  Glucose-Capillary Latest Range: 70-99 mg/dL 086 (H) 578 (H) 469 (H) 290 (H) 227 (H)   CBG's greater than goal.  May consider adding Novolog meal coverage 3 units tid with meals.  If meal coverage started, consider reducing correction Novolog to sensitive.  Thanks,  Beryl Meager, RN, BC-ADM Inpatient Diabetes Coordinator Pager [phone]

## 2013-02-17 NOTE — Discharge Summary (Signed)
DISCHARGE SUMMARY  [name]  MR#: [id number]  DOB:[birth year]  Date of Admission: 02/14/2013 Date of Discharge: 02/17/2013  Attending Physician:Cherissa Hook ALAN  Patient's NWG:[id number] [name], MD  Consults:  none  Discharge Diagnoses: Principal Problem: DIABETIC KETOACIDOSIS, resolved   Altered mental status, resolved Active Problems:   DIABETIC  RETINOPATHY, proliferative, stable   ISCHEMIC CARDIOMYOPATHY, with no CP   Chronic systolic heart failure, no signficant fluid overload this admission   Dehydration/  Acute renal insufficiency, resolved. 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     aspirin EC 81 MG tablet  Take 81 mg by mouth at bedtime.     CALCIUM PO  Take 500 mg by mouth daily.     carvedilol 6.25 MG tablet  Commonly known as:  COREG  Take 1 tablet (6.25 mg total) by mouth 2 (two) times daily.     citalopram 20 MG tablet  Commonly known as:  CELEXA  Take 20 mg by mouth daily.     clopidogrel 75 MG tablet  Commonly known as:  PLAVIX  Take 1 tablet (75 mg total) by mouth daily.     CRESTOR 40 MG tablet  Generic drug:  rosuvastatin  Take 40 mg by mouth daily.     digoxin 0.125 MG tablet  Commonly known as:  LANOXIN  1/2 every other day     ferrous sulfate 325 (65 FE) MG tablet  Take 325 mg by mouth 2 (two) times daily.     furosemide 20 MG tablet  Commonly known as:  LASIX  Take 1 tablet (20 mg total) by mouth daily.     insulin aspart 100 UNIT/ML injection  Commonly known as:  novoLOG  Inject 2-3 Units into the skin 3 (three) times daily before  meals. Sliding scale     insulin glargine 100 UNIT/ML injection  Commonly known as:  LANTUS  Inject 8 Units into the skin as directed.     QC DAILY MULTIVITAMINS/IRON Tabs  Take 1 tablet by mouth daily.     Vitamin D (Ergocalciferol) 50000 UNITS Caps capsule  Commonly known as:  DRISDOL  Take 50,000 Units by mouth every 7 (seven) days. Saturday        Hospital Procedures: Ct Head Wo Contrast  02/14/2013   CLINICAL DATA:  Headache, hyperglycemia  EXAM: CT HEAD WITHOUT CONTRAST  TECHNIQUE: Contiguous axial images were obtained from the base of the skull through the vertex without intravenous contrast.  COMPARISON:  03/21/2011  FINDINGS: No skull fracture is noted. Paranasal sinuses and mastoid air cells are unremarkable. Moderate cerebral atrophy. No intracranial hemorrhage, mass effect or midline shift. Ventricular size is stable from prior exam. No acute cortical infarction. No mass lesion is noted on this unenhanced scan.  IMPRESSION: No acute intracranial abnormality. Moderate cerebral atrophy.   Electronically Signed   By: Natasha Mead M.D.   On: 02/14/2013 19:43   Dg Chest Port 1 View  02/14/2013   CLINICAL DATA:  Altered mental status.  EXAM: PORTABLE CHEST - 1 VIEW  COMPARISON:  May 02, 2012.  FINDINGS: The heart size  and mediastinal contours are within normal limits. Both lungs are clear. Old right rib fracture is again noted.  IMPRESSION: No active disease.   Electronically Signed   By: Roque Lias M.D.   On: 02/14/2013 19:12    History of Present Illness: Elevated sugars and diminished responsiveness  Hospital Course: 52 YO WF with hx 50 yrs of DM I with eye/kidney/nerve disease, CAD with ischemic CMP. Presented with diminished LOC with DKA. Treated with IV insulin and gentle fluid hydration. DKA resolved rather quickly. Chronically has BS's in the 400+ range but has poor awareness of lows as well. Now has transitioned to a diet and is eating fairly well. Electrolytes have  done OK. No cardiac pain or fluid overload. Was able to walk with therapy some yesterday on several occasions. No current complaints. No f/c/s. BS' fair at best. Modest goals for glycemic control due to severe lows and end organ issues. Depression seems a bit better. Mental status has returned to baseline. Labs today are stable Will D/C home. To resume prior DM mgmt recs  Day of Discharge Exam BP 140/65  Pulse 67  Temp(Src) 98.1 F (36.7 C) (Oral)  Resp 18  Ht 5\' 4"  (1.626 m)  Wt 68.4 kg (150 lb 12.7 oz)  BMI 25.87 kg/m2  SpO2 100%   Physical Exam: Sitting up in no distress. Face symmetric. Oral membranes moist neck supple lungs clear ht regular with murmur abd soft NT extrems pulses intact no edema. Awake, alert mentating well, clear speech , chronic right sided weakness      Discharge Labs:  Recent Labs  02/15/13 0350 02/15/13 0942  NA 144 144  K 4.5 4.2  CL 109 110  CO2 19 25  GLUCOSE 307* 129*  BUN 47* 41*  CREATININE 1.45* 1.41*  CALCIUM 10.0 9.8    Recent Labs  02/14/13 1846  AST 15  ALT 18  ALKPHOS 90  BILITOT 0.6  PROT 8.3  ALBUMIN 4.0    Recent Labs  02/14/13 1846 02/14/13 1848  WBC 15.7*  --   NEUTROABS 12.6*  --   HGB 15.7* 15.3*  HCT 44.4 45.0  MCV 87.4  --   PLT 238  --    Results for [name] (MRN [id number]) as of 02/17/2013 13:31  Ref. Range 02/17/2013 10:25  Sodium Latest Range: 135-145 mEq/L 140  Potassium Latest Range: 3.5-5.1 mEq/L 4.3  Chloride Latest Range: 96-112 mEq/L 106  CO2 Latest Range: 19-32 mEq/L 23  BUN Latest Range: 6-23 mg/dL 13  Creatinine Latest Range: 0.50-1.10 mg/dL 4.09 (H)  Calcium Latest Range: 8.4-10.5 mg/dL 9.1  GFR calc non Af Amer Latest Range: >90 mL/min 55 (L)  GFR calc Af Amer Latest Range: >90 mL/min 64 (L)  Glucose Latest Range: 70-99 mg/dL 811 (H)  WBC Latest Range: 4.0-10.5 K/uL 7.2  RBC Latest Range: 3.87-5.11 MIL/uL 4.69  Hemoglobin Latest Range: 12.0-15.0 g/dL 91.4  HCT Latest Range:  36.0-46.0 % 41.3  MCV Latest Range: 78.0-100.0 fL 88.1  MCH Latest Range: 26.0-34.0 pg 30.3  MCHC Latest Range: 30.0-36.0 g/dL 78.2  RDW Latest Range: 11.5-15.5 % 14.0  Platelets Latest Range: 150-400 K/uL 195    Recent Labs  02/14/13 1846  TROPONINI <0.30      Discharge instructions:      Future Appointments Provider Department Dept Phone   03/23/2013 9:00 AM [facility] [place] [facility] [phone]   03/23/2013 10:45 AM Laurey Morale, MD [facility] [phone]  03/23/2013 2:15 PM Sherrie George, MD [facility] [phone]      Disposition: to home  Follow-up Appts: Follow-up with Dr. Evlyn Kanner at [facility] in 2 weeks.  Call for appointment. Resume prior DM mgmt  Condition on Discharge: improved  Tests Needing Follow-up: none  Signed: Julian Hy 02/17/2013, 9:23 AM '

## 2013-02-18 ENCOUNTER — Ambulatory Visit (INDEPENDENT_AMBULATORY_CARE_PROVIDER_SITE_OTHER): Payer: Self-pay | Admitting: Ophthalmology

## 2013-03-08 ENCOUNTER — Emergency Department (HOSPITAL_COMMUNITY): Payer: Medicare Other

## 2013-03-08 ENCOUNTER — Emergency Department (HOSPITAL_COMMUNITY)
Admission: EM | Admit: 2013-03-08 | Discharge: 2013-03-08 | Disposition: A | Discharge status: Home / Self Care | Payer: Medicare Other | Attending: Emergency Medicine | Admitting: Emergency Medicine

## 2013-03-08 ENCOUNTER — Encounter (HOSPITAL_COMMUNITY): Payer: Self-pay | Admitting: Emergency Medicine

## 2013-03-08 DIAGNOSIS — Z8614 Personal history of Methicillin resistant Staphylococcus aureus infection: Secondary | ICD-10-CM | POA: Insufficient documentation

## 2013-03-08 DIAGNOSIS — E11319 Type 2 diabetes mellitus with unspecified diabetic retinopathy without macular edema: Secondary | ICD-10-CM | POA: Insufficient documentation

## 2013-03-08 DIAGNOSIS — E785 Hyperlipidemia, unspecified: Secondary | ICD-10-CM | POA: Insufficient documentation

## 2013-03-08 DIAGNOSIS — D509 Iron deficiency anemia, unspecified: Secondary | ICD-10-CM | POA: Insufficient documentation

## 2013-03-08 DIAGNOSIS — Z8673 Personal history of transient ischemic attack (TIA), and cerebral infarction without residual deficits: Secondary | ICD-10-CM | POA: Insufficient documentation

## 2013-03-08 DIAGNOSIS — Z794 Long term (current) use of insulin: Secondary | ICD-10-CM | POA: Insufficient documentation

## 2013-03-08 DIAGNOSIS — I252 Old myocardial infarction: Secondary | ICD-10-CM | POA: Insufficient documentation

## 2013-03-08 DIAGNOSIS — I10 Essential (primary) hypertension: Secondary | ICD-10-CM | POA: Insufficient documentation

## 2013-03-08 DIAGNOSIS — N058 Unspecified nephritic syndrome with other morphologic changes: Secondary | ICD-10-CM | POA: Insufficient documentation

## 2013-03-08 DIAGNOSIS — R51 Headache: Secondary | ICD-10-CM | POA: Insufficient documentation

## 2013-03-08 DIAGNOSIS — Z7902 Long term (current) use of antithrombotics/antiplatelets: Secondary | ICD-10-CM | POA: Insufficient documentation

## 2013-03-08 DIAGNOSIS — I2510 Atherosclerotic heart disease of native coronary artery without angina pectoris: Secondary | ICD-10-CM | POA: Insufficient documentation

## 2013-03-08 DIAGNOSIS — Z8719 Personal history of other diseases of the digestive system: Secondary | ICD-10-CM | POA: Insufficient documentation

## 2013-03-08 DIAGNOSIS — M899 Disorder of bone, unspecified: Secondary | ICD-10-CM | POA: Insufficient documentation

## 2013-03-08 DIAGNOSIS — F3289 Other specified depressive episodes: Secondary | ICD-10-CM | POA: Insufficient documentation

## 2013-03-08 DIAGNOSIS — I5020 Unspecified systolic (congestive) heart failure: Secondary | ICD-10-CM | POA: Insufficient documentation

## 2013-03-08 DIAGNOSIS — Z79899 Other long term (current) drug therapy: Secondary | ICD-10-CM | POA: Insufficient documentation

## 2013-03-08 DIAGNOSIS — E1139 Type 2 diabetes mellitus with other diabetic ophthalmic complication: Secondary | ICD-10-CM | POA: Insufficient documentation

## 2013-03-08 DIAGNOSIS — E1129 Type 2 diabetes mellitus with other diabetic kidney complication: Secondary | ICD-10-CM | POA: Insufficient documentation

## 2013-03-08 DIAGNOSIS — Z7982 Long term (current) use of aspirin: Secondary | ICD-10-CM | POA: Insufficient documentation

## 2013-03-08 DIAGNOSIS — Z8669 Personal history of other diseases of the nervous system and sense organs: Secondary | ICD-10-CM | POA: Insufficient documentation

## 2013-03-08 DIAGNOSIS — F329 Major depressive disorder, single episode, unspecified: Secondary | ICD-10-CM | POA: Insufficient documentation

## 2013-03-08 LAB — POCT I-STAT, CHEM 8
BUN: 18 mg/dL (ref 6–23)
Calcium, Ion: 1.21 mmol/L (ref 1.12–1.23)
Chloride: 98 mEq/L (ref 96–112)
Creatinine, Ser: 1.4 mg/dL — ABNORMAL HIGH (ref 0.50–1.10)
Glucose, Bld: 281 mg/dL — ABNORMAL HIGH (ref 70–99)
HCT: 45 % (ref 36.0–46.0)
Potassium: 3.6 mEq/L (ref 3.5–5.1)
TCO2: 31 mmol/L (ref 0–100)

## 2013-03-08 MED ORDER — HYDROCODONE-ACETAMINOPHEN 5-325 MG PO TABS
1.0000 | ORAL_TABLET | Freq: Once | ORAL | Status: AC
  Administered 2013-03-08: 1 via ORAL
  Filled 2013-03-08: qty 1

## 2013-03-08 MED ORDER — PROCHLORPERAZINE EDISYLATE 5 MG/ML IJ SOLN
10.0000 mg | Freq: Once | INTRAMUSCULAR | Status: AC
  Administered 2013-03-08: 10 mg via INTRAVENOUS
  Filled 2013-03-08: qty 2

## 2013-03-08 MED ORDER — DIPHENHYDRAMINE HCL 50 MG/ML IJ SOLN
12.5000 mg | Freq: Once | INTRAMUSCULAR | Status: AC
  Administered 2013-03-08: 12.5 mg via INTRAVENOUS
  Filled 2013-03-08: qty 1

## 2013-03-08 MED ORDER — PROCHLORPERAZINE EDISYLATE 5 MG/ML IJ SOLN: 10.0000 mg | Freq: Four times a day (QID) | INTRAMUSCULAR | Status: DC | PRN

## 2013-03-08 MED ORDER — HYDROCODONE-ACETAMINOPHEN 5-325 MG PO TABS: 1.0000 | ORAL_TABLET | ORAL | Status: DC | PRN

## 2013-03-08 NOTE — ED Provider Notes (Signed)
CSN: [id number]     Arrival date & time 03/08/13  1411 History   First MD Initiated Contact with Patient 03/08/13 1457     Chief Complaint  Patient presents with  . Headache    Patient is a 52 y.o. female presenting with headaches. The history is provided by the patient.  Headache Pain location:  Frontal (right side, over the right eye) Quality:  Sharp Radiates to:  Does not radiate Onset quality:  Sudden Duration: it started about a month ago and last about a minute at a time, today she has had three  Chronicity:  New Context: not exposure to bright light and not straining   Relieved by:  Nothing Worsened by:  Nothing tried Ineffective treatments:  None tried Associated symptoms: no blurred vision, no fever, no focal weakness, no hearing loss, no loss of balance, no photophobia and no syncope   Pt was concerned bc she has a history of stroke.  She called her doctor and was told to come to the ED.  She is not sure what brings them on, they just come and go.  Past Medical History  Diagnosis Date  . Hypertension   . Vitamin D deficiency   . Coronary artery disease     NSTEMI 12/11. Cardiac catheterization (12/11) showing LAD totalled in the midportion, diagonal 70% stenosed (small 2-mm vessel), circumflex nonobstructive, OM1 40%, OM2 30%, RCA samll and no significat disease.  LAD occlusion was calcified and appeared chronic at the time of cathetrization so no intervention.  . Ischemic cardiomyopathy     Echo 12/11 with EF 30% and periapical akinesis, no significant MR. RV  looked normal. QRS is not wide on ECG.  Echo (1/12):  EF 40-45%, severe hypokinesis of the anteroseptal wall, apical inferior wall, inferoseptal wall, and apex.  . Orthostatic hypotension     sycopal episode in 12/11 likely due to this.  Probably from combination of depressed cardiac out put and diabetic autonomic neuropathy.  . Diabetes mellitus     Insulin dependant Type I   . Hyperlipidemia   . Diabetic  nephropathy   . Diabetic retinopathy   . Gastroparesis   . Depression   . Systolic heart failure   . Iron deficiency anemia   . Hx MRSA infection   . Osteopenia   . Esophagitis   . Heart attack   . Stroke May 2012    weakness right side - right hand and right arm the worst   Past Surgical History  Procedure Laterality Date  . Appendectomy  1980  . Tubal ligation  1987  . Cesarean section  1984  . Refractive surgery      x3   Family History  Problem Relation Age of Onset  . Pancreatic cancer Father   . Colon cancer Neg Hx   . Diabetes Maternal Grandfather    History  Substance Use Topics  . Smoking status: Never Smoker   . Smokeless tobacco: Never Used  . Alcohol Use: No   OB History   Grav Para Term Preterm Abortions TAB SAB Ect Mult Living                 Review of Systems  Constitutional: Negative for fever.  HENT: Negative for hearing loss.   Eyes: Negative for blurred vision and photophobia.  Cardiovascular: Negative for syncope.  Neurological: Positive for headaches. Negative for focal weakness and loss of balance.  All other systems reviewed and are negative.    Allergies  Erythromycin; Food; and Metoclopramide hcl  Home Medications   Current Outpatient Rx  Name  Route  Sig  Dispense  Refill  . ALPRAZolam (XANAX) 0.5 MG tablet   Oral   Take 0.5 mg by mouth daily as needed for anxiety.          [name] aspirin EC 81 MG tablet   Oral   Take 81 mg by mouth at bedtime.          [name] CALCIUM PO   Oral   Take 500 mg by mouth daily.         . carvedilol (COREG) 6.25 MG tablet   Oral   Take 1 tablet (6.25 mg total) by mouth 2 (two) times daily.   60 tablet   6   . clopidogrel (PLAVIX) 75 MG tablet   Oral   Take 1 tablet (75 mg total) by mouth daily.   30 tablet   6   . digoxin (LANOXIN) 0.125 MG tablet      1/2 every other day   15 tablet   6   . ferrous sulfate 325 (65 FE) MG tablet   Oral   Take 325 mg by mouth 2 (two) times daily.           . furosemide (LASIX) 40 MG tablet   Oral   Take 40 mg by mouth daily.         . insulin aspart (NOVOLOG) 100 UNIT/ML injection   Subcutaneous   Inject 2-3 Units into the skin 3 (three) times daily before meals. Sliding scale         . insulin glargine (LANTUS) 100 UNIT/ML injection   Subcutaneous   Inject 20 Units into the skin daily.         . Levomilnacipran HCl ER (FETZIMA) 40 MG CP24   Oral   Take 1 tablet by mouth every morning.         . Multiple Vitamins-Iron (QC DAILY MULTIVITAMINS/IRON) TABS   Oral   Take 1 tablet by mouth daily.          . rosuvastatin (CRESTOR) 40 MG tablet   Oral   Take 40 mg by mouth daily.    30 tablet   11   . Vitamin D, Ergocalciferol, (DRISDOL) 50000 UNITS CAPS   Oral   Take 50,000 Units by mouth every 7 (seven) days. Saturday         . HYDROcodone-acetaminophen (NORCO/VICODIN) 5-325 MG per tablet   Oral   Take 1-2 tablets by mouth every 4 (four) hours as needed.   16 tablet   0    BP 136/74  Pulse 80  Temp(Src) 98.1 F (36.7 C) (Oral)  Resp 18  SpO2 97% Physical Exam  Nursing note and vitals reviewed. Constitutional: She is oriented to person, place, and time. She appears well-developed and well-nourished. No distress.  HENT:  Head: Normocephalic and atraumatic.  Right Ear: External ear normal.  Left Ear: External ear normal.  Mouth/Throat: Oropharynx is clear and moist.  No ttp temporal artery  Eyes: Conjunctivae and EOM are normal. Right eye exhibits no discharge. Left eye exhibits no discharge. No scleral icterus.  No ocular ttp, anterior chamber clear  Neck: Neck supple. No tracheal deviation present.  Cardiovascular: Normal rate, regular rhythm and intact distal pulses.   Pulmonary/Chest: Effort normal and breath sounds normal. No stridor. No respiratory distress. She has no wheezes. She has no rales.  Abdominal: Soft. Bowel sounds are normal. She  exhibits no distension. There is no tenderness.  There is no rebound and no guarding.  Musculoskeletal: She exhibits no edema and no tenderness.  Neurological: She is alert and oriented to person, place, and time. No sensory deficit. Cranial nerve deficit: no facial droop , eomi, tongue midline. She exhibits normal muscle tone. She displays no seizure activity.  pronator drift bilateral right upper extrem,weaker grip strength right hand able to hold both legs off bed for 5 seconds, sensation intact in all extremities, no visual field cuts, no left or right sided neglect, normal finger-nose exam bilaterally although performs it slowly, no nystagmus noted   Skin: Skin is warm and dry. No rash noted.  Psychiatric: She has a normal mood and affect.    ED Course  Procedures (including critical care time) Labs Review Labs Reviewed  POCT I-STAT, CHEM 8 - Abnormal; Notable for the following:    Creatinine, Ser 1.40 (*)    Glucose, Bld 281 (*)    Hemoglobin 15.3 (*)    All other components within normal limits   Imaging Review Ct Head Wo Contrast  03/08/2013   CLINICAL DATA:  Persistent headache.  EXAM: CT HEAD WITHOUT CONTRAST  TECHNIQUE: Contiguous axial images were obtained from the base of the skull through the vertex without intravenous contrast.  COMPARISON:  02/14/2013  FINDINGS: No mass lesion. No midline shift. No acute hemorrhage or hematoma. No extra-axial fluid collections. No evidence of acute infarction. There is moderate diffuse atrophy with secondary ventricular prominence, stable. Tiny scattered areas of periventricular white matter lucency consistent with chronic small vessel ischemic disease. Osseous structures are normal.  IMPRESSION: No acute abnormalities. No change since the prior study. Chronic small vessel ischemic changes. Moderate atrophy.   Electronically Signed   By: Geanie Cooley M.D.   On: 03/08/2013 15:41    EKG Interpretation   None       MDM   1. Headache   CT scan normal.  No complaints of eye pain,   Pt  states pain is more forehead.  Doubt ocular etiology.  ? Migraine, ?cluster.  At this time there does not appear to be any evidence of an acute emergency medical condition and the patient appears stable for discharge with appropriate outpatient follow up.    Celene Kras, MD 03/08/13 [phone]

## 2013-03-08 NOTE — ED Notes (Signed)
Pt reports that she has had a headache right in the center of her forehead for the past month almost. Denies any vision changes or n/v. No neuro deficits, sensation intact. Pt A&Ox4.

## 2013-03-08 NOTE — ED Notes (Signed)
MD at bedside. 

## 2013-03-08 NOTE — ED Notes (Signed)
Pt given crackers and diet ginger ale 

## 2013-03-09 NOTE — Telephone Encounter (Signed)
completed

## 2013-03-23 ENCOUNTER — Ambulatory Visit (INDEPENDENT_AMBULATORY_CARE_PROVIDER_SITE_OTHER): Payer: Self-pay | Admitting: Ophthalmology

## 2013-03-23 ENCOUNTER — Other Ambulatory Visit: Payer: Medicare Other

## 2013-03-23 ENCOUNTER — Inpatient Hospital Stay (HOSPITAL_COMMUNITY)
Admission: EM | Admit: 2013-03-23 | Discharge: 2013-03-26 | DRG: 637 | Disposition: A | Discharge status: Home / Self Care | Payer: Medicare Other | Attending: Endocrinology | Admitting: Endocrinology

## 2013-03-23 ENCOUNTER — Encounter (HOSPITAL_COMMUNITY): Payer: Self-pay | Admitting: Emergency Medicine

## 2013-03-23 ENCOUNTER — Ambulatory Visit: Payer: Medicare Other | Admitting: Cardiology

## 2013-03-23 DIAGNOSIS — N058 Unspecified nephritic syndrome with other morphologic changes: Secondary | ICD-10-CM | POA: Diagnosis present

## 2013-03-23 DIAGNOSIS — E1049 Type 1 diabetes mellitus with other diabetic neurological complication: Secondary | ICD-10-CM | POA: Diagnosis present

## 2013-03-23 DIAGNOSIS — I2589 Other forms of chronic ischemic heart disease: Secondary | ICD-10-CM | POA: Diagnosis present

## 2013-03-23 DIAGNOSIS — E876 Hypokalemia: Secondary | ICD-10-CM | POA: Diagnosis present

## 2013-03-23 DIAGNOSIS — F329 Major depressive disorder, single episode, unspecified: Secondary | ICD-10-CM | POA: Diagnosis present

## 2013-03-23 DIAGNOSIS — R29898 Other symptoms and signs involving the musculoskeletal system: Secondary | ICD-10-CM | POA: Diagnosis present

## 2013-03-23 DIAGNOSIS — E11359 Type 2 diabetes mellitus with proliferative diabetic retinopathy without macular edema: Secondary | ICD-10-CM | POA: Diagnosis present

## 2013-03-23 DIAGNOSIS — E1029 Type 1 diabetes mellitus with other diabetic kidney complication: Secondary | ICD-10-CM | POA: Diagnosis present

## 2013-03-23 DIAGNOSIS — E1039 Type 1 diabetes mellitus with other diabetic ophthalmic complication: Secondary | ICD-10-CM | POA: Diagnosis present

## 2013-03-23 DIAGNOSIS — I509 Heart failure, unspecified: Secondary | ICD-10-CM | POA: Diagnosis present

## 2013-03-23 DIAGNOSIS — N179 Acute kidney failure, unspecified: Secondary | ICD-10-CM | POA: Diagnosis present

## 2013-03-23 DIAGNOSIS — E860 Dehydration: Secondary | ICD-10-CM | POA: Diagnosis present

## 2013-03-23 DIAGNOSIS — I639 Cerebral infarction, unspecified: Secondary | ICD-10-CM

## 2013-03-23 DIAGNOSIS — E1110 Type 2 diabetes mellitus with ketoacidosis without coma: Secondary | ICD-10-CM

## 2013-03-23 DIAGNOSIS — I2510 Atherosclerotic heart disease of native coronary artery without angina pectoris: Secondary | ICD-10-CM

## 2013-03-23 DIAGNOSIS — K3184 Gastroparesis: Secondary | ICD-10-CM | POA: Diagnosis present

## 2013-03-23 DIAGNOSIS — I129 Hypertensive chronic kidney disease with stage 1 through stage 4 chronic kidney disease, or unspecified chronic kidney disease: Secondary | ICD-10-CM | POA: Diagnosis present

## 2013-03-23 DIAGNOSIS — Z6826 Body mass index (BMI) 26.0-26.9, adult: Secondary | ICD-10-CM

## 2013-03-23 DIAGNOSIS — Z23 Encounter for immunization: Secondary | ICD-10-CM

## 2013-03-23 DIAGNOSIS — I69998 Other sequelae following unspecified cerebrovascular disease: Secondary | ICD-10-CM

## 2013-03-23 DIAGNOSIS — I2582 Chronic total occlusion of coronary artery: Secondary | ICD-10-CM | POA: Diagnosis present

## 2013-03-23 DIAGNOSIS — E1139 Type 2 diabetes mellitus with other diabetic ophthalmic complication: Secondary | ICD-10-CM | POA: Diagnosis present

## 2013-03-23 DIAGNOSIS — E785 Hyperlipidemia, unspecified: Secondary | ICD-10-CM | POA: Diagnosis present

## 2013-03-23 DIAGNOSIS — N289 Disorder of kidney and ureter, unspecified: Secondary | ICD-10-CM

## 2013-03-23 DIAGNOSIS — F32A Depression, unspecified: Secondary | ICD-10-CM | POA: Diagnosis present

## 2013-03-23 DIAGNOSIS — Z7902 Long term (current) use of antithrombotics/antiplatelets: Secondary | ICD-10-CM

## 2013-03-23 DIAGNOSIS — Z7982 Long term (current) use of aspirin: Secondary | ICD-10-CM

## 2013-03-23 DIAGNOSIS — I5022 Chronic systolic (congestive) heart failure: Secondary | ICD-10-CM

## 2013-03-23 DIAGNOSIS — E441 Mild protein-calorie malnutrition: Secondary | ICD-10-CM | POA: Diagnosis present

## 2013-03-23 DIAGNOSIS — I252 Old myocardial infarction: Secondary | ICD-10-CM

## 2013-03-23 DIAGNOSIS — I10 Essential (primary) hypertension: Secondary | ICD-10-CM | POA: Diagnosis present

## 2013-03-23 DIAGNOSIS — N189 Chronic kidney disease, unspecified: Secondary | ICD-10-CM | POA: Diagnosis present

## 2013-03-23 DIAGNOSIS — I214 Non-ST elevation (NSTEMI) myocardial infarction: Secondary | ICD-10-CM | POA: Diagnosis not present

## 2013-03-23 DIAGNOSIS — M899 Disorder of bone, unspecified: Secondary | ICD-10-CM | POA: Diagnosis present

## 2013-03-23 DIAGNOSIS — R7989 Other specified abnormal findings of blood chemistry: Secondary | ICD-10-CM

## 2013-03-23 DIAGNOSIS — E1010 Type 1 diabetes mellitus with ketoacidosis without coma: Principal | ICD-10-CM | POA: Diagnosis present

## 2013-03-23 DIAGNOSIS — I255 Ischemic cardiomyopathy: Secondary | ICD-10-CM | POA: Diagnosis present

## 2013-03-23 DIAGNOSIS — F3289 Other specified depressive episodes: Secondary | ICD-10-CM | POA: Diagnosis present

## 2013-03-23 DIAGNOSIS — E1065 Type 1 diabetes mellitus with hyperglycemia: Secondary | ICD-10-CM | POA: Diagnosis present

## 2013-03-23 DIAGNOSIS — E559 Vitamin D deficiency, unspecified: Secondary | ICD-10-CM | POA: Diagnosis present

## 2013-03-23 DIAGNOSIS — G909 Disorder of the autonomic nervous system, unspecified: Secondary | ICD-10-CM | POA: Diagnosis present

## 2013-03-23 HISTORY — DX: Type 2 diabetes mellitus with diabetic neuropathy, unspecified: E11.40

## 2013-03-23 HISTORY — DX: Non-ST elevation (NSTEMI) myocardial infarction: I21.4

## 2013-03-23 LAB — BASIC METABOLIC PANEL
BUN: 33 mg/dL — ABNORMAL HIGH (ref 6–23)
BUN: 24 mg/dL — ABNORMAL HIGH (ref 6–23)
CO2: 10 mEq/L — CL (ref 19–32)
CO2: 19 mEq/L (ref 19–32)
CO2: 22 mEq/L (ref 19–32)
Calcium: 8.9 mg/dL (ref 8.4–10.5)
Calcium: 8.7 mg/dL (ref 8.4–10.5)
Chloride: 102 mEq/L (ref 96–112)
Creatinine, Ser: 1.74 mg/dL — ABNORMAL HIGH (ref 0.50–1.10)
Creatinine, Ser: 1.75 mg/dL — ABNORMAL HIGH (ref 0.50–1.10)
GFR calc Af Amer: 38 mL/min — ABNORMAL LOW (ref >90)
GFR calc non Af Amer: 32 mL/min — ABNORMAL LOW (ref >90)
GFR calc non Af Amer: 32 mL/min — ABNORMAL LOW (ref >90)
Glucose, Bld: 944 mg/dL (ref 70–99)
Glucose, Bld: 327 mg/dL — ABNORMAL HIGH (ref 70–99)
Glucose, Bld: 100 mg/dL — ABNORMAL HIGH (ref 70–99)
Potassium: 5 mEq/L (ref 3.5–5.1)
Potassium: 3.4 mEq/L — ABNORMAL LOW (ref 3.5–5.1)
Potassium: 3.9 mEq/L (ref 3.5–5.1)
Sodium: 126 mEq/L — ABNORMAL LOW (ref 135–145)
Sodium: 144 mEq/L (ref 135–145)

## 2013-03-23 LAB — URINE MICROSCOPIC-ADD ON

## 2013-03-23 LAB — CBC
HCT: 38.1 % (ref 36.0–46.0)
Hemoglobin: 14.3 g/dL (ref 12.0–15.0)
Hemoglobin: 12.9 g/dL (ref 12.0–15.0)
MCH: 30 pg (ref 26.0–34.0)
MCH: 29.8 pg (ref 26.0–34.0)
MCHC: 32.3 g/dL (ref 30.0–36.0)
MCHC: 33.9 g/dL (ref 30.0–36.0)
MCV: 93.1 fL (ref 78.0–100.0)
MCV: 88 fL (ref 78.0–100.0)
Platelets: 281 10*3/uL (ref 150–400)
RBC: 4.76 MIL/uL (ref 3.87–5.11)

## 2013-03-23 LAB — POCT I-STAT 3, VENOUS BLOOD GAS (G3P V)
Acid-base deficit: 18 mmol/L — ABNORMAL HIGH (ref 0.0–2.0)
Bicarbonate: 9.7 mEq/L — ABNORMAL LOW (ref 20.0–24.0)
O2 Saturation: 52 %
Patient temperature: 97.4
TCO2: 11 mmol/L (ref 0–100)
pCO2, Ven: 27.6 mmHg — ABNORMAL LOW (ref 45.0–50.0)
pH, Ven: 7.15 — CL (ref 7.250–7.300)
pO2, Ven: 34 mmHg (ref 30.0–45.0)

## 2013-03-23 LAB — URINALYSIS, ROUTINE W REFLEX MICROSCOPIC
Bilirubin Urine: NEGATIVE
Glucose, UA: >1000 mg/dL — AB
Glucose, UA: >1000 mg/dL — AB
Ketones, ur: >80 mg/dL — AB
Ketones, ur: >80 mg/dL — AB
Leukocytes, UA: NEGATIVE
Nitrite: NEGATIVE
Nitrite: NEGATIVE
Protein, ur: 30 mg/dL — AB
Protein, ur: 100 mg/dL — AB
Specific Gravity, Urine: 1.024 (ref 1.005–1.030)
Urobilinogen, UA: 0.2 mg/dL (ref 0.0–1.0)
pH: 5 (ref 5.0–8.0)

## 2013-03-23 LAB — HEMOGLOBIN A1C: Hgb A1c MFr Bld: 13.2 % — ABNORMAL HIGH (ref <5.7)

## 2013-03-23 LAB — KETONES, QUALITATIVE

## 2013-03-23 LAB — GLUCOSE, CAPILLARY
Glucose-Capillary: >600 mg/dL (ref 70–99)
Glucose-Capillary: 430 mg/dL — ABNORMAL HIGH (ref 70–99)
Glucose-Capillary: 249 mg/dL — ABNORMAL HIGH (ref 70–99)
Glucose-Capillary: 103 mg/dL — ABNORMAL HIGH (ref 70–99)
Glucose-Capillary: 94 mg/dL (ref 70–99)
Glucose-Capillary: 91 mg/dL (ref 70–99)
Glucose-Capillary: 223 mg/dL — ABNORMAL HIGH (ref 70–99)

## 2013-03-23 LAB — TROPONIN I: Troponin I: 3.75 ng/mL (ref <0.30)

## 2013-03-23 LAB — MRSA PCR SCREENING: MRSA by PCR: NEGATIVE

## 2013-03-23 MED ORDER — DIGOXIN 125 MCG PO TABS
0.1250 mg | ORAL_TABLET | Freq: Every day | ORAL | Status: DC
Start: 2013-03-23 — End: 2013-03-26
  Administered 2013-03-23 – 2013-03-25 (×3): 0.125 mg via ORAL
  Filled 2013-03-23 (×4): qty 1

## 2013-03-23 MED ORDER — ENOXAPARIN SODIUM 40 MG/0.4ML ~~LOC~~ SOLN
40.0000 mg | SUBCUTANEOUS | Status: DC
  Administered 2013-03-23 – 2013-03-24 (×2): 40 mg via SUBCUTANEOUS
  Filled 2013-03-23 (×3): qty 0.4

## 2013-03-23 MED ORDER — POTASSIUM CHLORIDE CRYS ER 20 MEQ PO TBCR
40.0000 meq | EXTENDED_RELEASE_TABLET | Freq: Two times a day (BID) | ORAL | Status: DC
  Administered 2013-03-23 – 2013-03-24 (×4): 40 meq via ORAL
  Filled 2013-03-23 (×7): qty 2

## 2013-03-23 MED ORDER — DEXTROSE-NACL 5-0.45 % IV SOLN
INTRAVENOUS | Status: DC
  Administered 2013-03-23: 10:00:00 via INTRAVENOUS

## 2013-03-23 MED ORDER — CITALOPRAM HYDROBROMIDE 40 MG PO TABS
40.0000 mg | ORAL_TABLET | Freq: Every day | ORAL | Status: DC
  Administered 2013-03-23 – 2013-03-26 (×4): 40 mg via ORAL
  Filled 2013-03-23 (×4): qty 1

## 2013-03-23 MED ORDER — SODIUM CHLORIDE 0.9 % IV SOLN
1000.0000 mL | INTRAVENOUS | Status: DC
  Administered 2013-03-23: 1000 mL via INTRAVENOUS

## 2013-03-23 MED ORDER — ONDANSETRON HCL 4 MG/2ML IJ SOLN
4.0000 mg | Freq: Once | INTRAMUSCULAR | Status: AC
  Administered 2013-03-23: 4 mg via INTRAVENOUS
  Filled 2013-03-23: qty 2

## 2013-03-23 MED ORDER — SODIUM CHLORIDE 0.9 % IV SOLN: INTRAVENOUS | Status: DC

## 2013-03-23 MED ORDER — QC DAILY MULTIVITAMINS/IRON PO TABS: 1.0000 | ORAL_TABLET | Freq: Every day | ORAL | Status: DC

## 2013-03-23 MED ORDER — INSULIN GLARGINE 100 UNIT/ML ~~LOC~~ SOLN
15.0000 [IU] | SUBCUTANEOUS | Status: DC
  Administered 2013-03-23: 15 [IU] via SUBCUTANEOUS
  Filled 2013-03-23 (×2): qty 0.15

## 2013-03-23 MED ORDER — ALPRAZOLAM 0.5 MG PO TABS: 0.5000 mg | ORAL_TABLET | Freq: Every day | ORAL | Status: DC | PRN

## 2013-03-23 MED ORDER — ASPIRIN EC 81 MG PO TBEC
81.0000 mg | DELAYED_RELEASE_TABLET | Freq: Every day | ORAL | Status: DC
  Administered 2013-03-23 – 2013-03-25 (×3): 81 mg via ORAL
  Filled 2013-03-23 (×6): qty 1

## 2013-03-23 MED ORDER — DEXTROSE 50 % IV SOLN: 25.0000 mL | INTRAVENOUS | Status: DC | PRN

## 2013-03-23 MED ORDER — ATORVASTATIN CALCIUM 40 MG PO TABS
40.0000 mg | ORAL_TABLET | Freq: Every day | ORAL | Status: DC
  Administered 2013-03-23 – 2013-03-25 (×3): 40 mg via ORAL
  Filled 2013-03-23 (×5): qty 1

## 2013-03-23 MED ORDER — SODIUM CHLORIDE 0.9 % IV SOLN
1000.0000 mL | Freq: Once | INTRAVENOUS | Status: AC
  Administered 2013-03-23: 1000 mL via INTRAVENOUS

## 2013-03-23 MED ORDER — INSULIN ASPART 100 UNIT/ML ~~LOC~~ SOLN
0.0000 [IU] | Freq: Every day | SUBCUTANEOUS | Status: DC
  Administered 2013-03-23: 2 [IU] via SUBCUTANEOUS

## 2013-03-23 MED ORDER — SODIUM CHLORIDE 0.9 % IV SOLN
INTRAVENOUS | Status: DC
  Administered 2013-03-23: 04:00:00 via INTRAVENOUS
  Administered 2013-03-23: 1000 mL via INTRAVENOUS

## 2013-03-23 MED ORDER — INSULIN ASPART 100 UNIT/ML ~~LOC~~ SOLN
0.0000 [IU] | Freq: Three times a day (TID) | SUBCUTANEOUS | Status: DC
  Administered 2013-03-24: 2 [IU] via SUBCUTANEOUS
  Administered 2013-03-24: 5 [IU] via SUBCUTANEOUS
  Administered 2013-03-24 – 2013-03-26 (×5): 2 [IU] via SUBCUTANEOUS

## 2013-03-23 MED ORDER — FERROUS SULFATE 325 (65 FE) MG PO TABS
325.0000 mg | ORAL_TABLET | Freq: Two times a day (BID) | ORAL | Status: DC
  Administered 2013-03-23 – 2013-03-24 (×3): 325 mg via ORAL
  Filled 2013-03-23 (×5): qty 1

## 2013-03-23 MED ORDER — ONDANSETRON HCL 4 MG/2ML IJ SOLN: 4.0000 mg | Freq: Three times a day (TID) | INTRAMUSCULAR | Status: AC | PRN

## 2013-03-23 MED ORDER — CLOPIDOGREL BISULFATE 75 MG PO TABS
75.0000 mg | ORAL_TABLET | Freq: Every day | ORAL | Status: DC
  Administered 2013-03-23 – 2013-03-26 (×4): 75 mg via ORAL
  Filled 2013-03-23 (×3): qty 1

## 2013-03-23 MED ORDER — INSULIN ASPART 100 UNIT/ML ~~LOC~~ SOLN
3.0000 [IU] | Freq: Three times a day (TID) | SUBCUTANEOUS | Status: DC
  Administered 2013-03-24 – 2013-03-26 (×8): 3 [IU] via SUBCUTANEOUS

## 2013-03-23 MED ORDER — TAB-A-VITE/IRON PO TABS
1.0000 | ORAL_TABLET | Freq: Every day | ORAL | Status: DC
  Administered 2013-03-23 – 2013-03-26 (×4): 1 via ORAL
  Filled 2013-03-23 (×4): qty 1

## 2013-03-23 MED ORDER — SODIUM CHLORIDE 0.9 % IV SOLN
INTRAVENOUS | Status: DC
  Administered 2013-03-23: 10.8 [IU]/h via INTRAVENOUS
  Administered 2013-03-23: 5.4 [IU]/h via INTRAVENOUS
  Filled 2013-03-23 (×3): qty 1

## 2013-03-23 MED ORDER — PNEUMOCOCCAL VAC POLYVALENT 25 MCG/0.5ML IJ INJ
0.5000 mL | INJECTION | INTRAMUSCULAR | Status: AC
  Administered 2013-03-24: 0.5 mL via INTRAMUSCULAR
  Filled 2013-03-23: qty 0.5

## 2013-03-23 NOTE — Progress Notes (Signed)
Utilization review completed.  

## 2013-03-23 NOTE — Progress Notes (Signed)
CRITICAL VALUE ALERT  Critical value received:  Trop 1.99  Date of notification:  03-23-2013  Time of notification:    Critical value read back:  Nurse who received alert:  Osvaldo Human  MD notified (1st page):  Dr. Evlyn Kanner  Time of first page:  1200  MD notified (2nd page):  Time of second page:  Responding MD:  Dr Evlyn Kanner  Time MD responded:  1205   New orders given, will continue to monitor.

## 2013-03-23 NOTE — Progress Notes (Addendum)
Inpatient Diabetes Program Recommendations  AACE/ADA: New Consensus Statement on Inpatient Glycemic Control (2013)  Target Ranges:  Prepandial:   less than 140 mg/dL      Peak postprandial:   less than 180 mg/dL (1-2 hours)      Critically ill patients:  140 - 180 mg/dL     **Admitted with DKA.  Seen by her PCP/Endo this AM (Dr. Evlyn Kanner).  Currently on IV insulin drip per the GlucoStabilizer.  IVF switched to D5 1/2 NS this AM once CBG was below 250 mg/dl per orders.  **Noted BMET ordered for later this afternoon.  Once anion gap closed and acidosis cleared, patient can be transitioned off IV insulin drip.  RN: Please make sure patient gets her Lantus 1-2 hours before IV insulin drip stopped.   Will follow. Ambrose Finland RN, MSN, CDE Diabetes Coordinator Inpatient Diabetes Program Team Pager: [phone] (8a-10p)

## 2013-03-23 NOTE — ED Provider Notes (Signed)
CSN: [id number]     Arrival date & time 03/23/13  0054 History   First MD Initiated Contact with Patient 03/23/13 0107     Chief Complaint  Patient presents with  . Hyperglycemia    The history is provided by the patient and medical records.   patient reports developing nausea vomiting this evening which checked her blood sugar was 2-read.  She has a history of DKA in the past.  She reports compliance with her medications and reports a normal diabetic diet.  She reports no new medications.  No fevers or chills.  No cough or congestion.  No urinary complaints.  She denies recent diarrheal illness or other sick contacts.  Symptoms are mild to moderate in severity.  Nothing worsens or improves her symptoms  Past Medical History  Diagnosis Date  . Hypertension   . Vitamin D deficiency   . Coronary artery disease     NSTEMI 12/11. Cardiac catheterization (12/11) showing LAD totalled in the midportion, diagonal 70% stenosed (small 2-mm vessel), circumflex nonobstructive, OM1 40%, OM2 30%, RCA samll and no significat disease.  LAD occlusion was calcified and appeared chronic at the time of cathetrization so no intervention.  . Ischemic cardiomyopathy     Echo 12/11 with EF 30% and periapical akinesis, no significant MR. RV  looked normal. QRS is not wide on ECG.  Echo (1/12):  EF 40-45%, severe hypokinesis of the anteroseptal wall, apical inferior wall, inferoseptal wall, and apex.  . Orthostatic hypotension     sycopal episode in 12/11 likely due to this.  Probably from combination of depressed cardiac out put and diabetic autonomic neuropathy.  . Diabetes mellitus     Insulin dependant Type I   . Hyperlipidemia   . Diabetic nephropathy   . Diabetic retinopathy   . Gastroparesis   . Depression   . Systolic heart failure   . Iron deficiency anemia   . Hx MRSA infection   . Osteopenia   . Esophagitis   . Heart attack   . Stroke May 2012    weakness right side - right hand and right arm the  worst   Past Surgical History  Procedure Laterality Date  . Appendectomy  1980  . Tubal ligation  1987  . Cesarean section  1984  . Refractive surgery      x3   Family History  Problem Relation Age of Onset  . Pancreatic cancer Father   . Colon cancer Neg Hx   . Diabetes Maternal Grandfather    History  Substance Use Topics  . Smoking status: Never Smoker   . Smokeless tobacco: Never Used  . Alcohol Use: No   OB History   Grav Para Term Preterm Abortions TAB SAB Ect Mult Living                 Review of Systems  All other systems reviewed and are negative.    Allergies  Erythromycin; Food; and Metoclopramide hcl  Home Medications   Current Outpatient Rx  Name  Route  Sig  Dispense  Refill  . ALPRAZolam (XANAX) 0.5 MG tablet   Oral   Take 0.5 mg by mouth daily as needed for anxiety.          [name] aspirin EC 81 MG tablet   Oral   Take 81 mg by mouth at bedtime.          [name] CALCIUM PO   Oral   Take 500 mg by mouth  daily.         . carvedilol (COREG) 6.25 MG tablet   Oral   Take 1 tablet (6.25 mg total) by mouth 2 (two) times daily.   60 tablet   6   . clopidogrel (PLAVIX) 75 MG tablet   Oral   Take 1 tablet (75 mg total) by mouth daily.   30 tablet   6   . digoxin (LANOXIN) 0.125 MG tablet      1/2 every other day   15 tablet   6   . ferrous sulfate 325 (65 FE) MG tablet   Oral   Take 325 mg by mouth 2 (two) times daily.          . furosemide (LASIX) 40 MG tablet   Oral   Take 40 mg by mouth daily.         [name] HYDROcodone-acetaminophen (NORCO/VICODIN) 5-325 MG per tablet   Oral   Take 1-2 tablets by mouth every 4 (four) hours as needed.   16 tablet   0   . insulin aspart (NOVOLOG) 100 UNIT/ML injection   Subcutaneous   Inject 2-3 Units into the skin 3 (three) times daily before meals. Sliding scale         . insulin glargine (LANTUS) 100 UNIT/ML injection   Subcutaneous   Inject 20 Units into the skin daily.         .  Levomilnacipran HCl ER (FETZIMA) 40 MG CP24   Oral   Take 1 tablet by mouth every morning.         . Multiple Vitamins-Iron (QC DAILY MULTIVITAMINS/IRON) TABS   Oral   Take 1 tablet by mouth daily.          . rosuvastatin (CRESTOR) 40 MG tablet   Oral   Take 40 mg by mouth daily.    30 tablet   11   . Vitamin D, Ergocalciferol, (DRISDOL) 50000 UNITS CAPS   Oral   Take 50,000 Units by mouth every 7 (seven) days. Saturday          BP 103/56  Pulse 97  Temp(Src) 97.4 F (36.3 C) (Oral)  Resp 18  SpO2 99%  LMP 03/13/2011 Physical Exam  Nursing note and vitals reviewed. Constitutional: She is oriented to person, place, and time. She appears well-developed and well-nourished. No distress.  HENT:  Head: Normocephalic and atraumatic.  Eyes: EOM are normal.  Neck: Normal range of motion.  Cardiovascular: Normal rate, regular rhythm and normal heart sounds.   Pulmonary/Chest: Effort normal and breath sounds normal.  Abdominal: Soft. She exhibits no distension. There is no tenderness.  Musculoskeletal: Normal range of motion.  Neurological: She is alert and oriented to person, place, and time.  Skin: Skin is warm and dry.  Psychiatric: She has a normal mood and affect. Judgment normal.    ED Course  Procedures (including critical care time)  CRITICAL CARE Performed by: Lyanne Co Total critical care time: 30 Critical care time was exclusive of separately billable procedures and treating other patients. Critical care was necessary to treat or prevent imminent or life-threatening deterioration. Critical care was time spent personally by me on the following activities: development of treatment plan with patient and/or surrogate as well as nursing, discussions with consultants, evaluation of patient's response to treatment, examination of patient, obtaining history from patient or surrogate, ordering and performing treatments and interventions, ordering and review of  laboratory studies, ordering and review of radiographic studies, pulse oximetry and re-evaluation  of patient's condition.   Labs Review Labs Reviewed  BASIC METABOLIC PANEL - Abnormal; Notable for the following:    Sodium 126 (*)    Chloride 80 (*)    CO2 10 (*)    Glucose, Bld 944 (*)    BUN 33 (*)    Creatinine, Ser 1.74 (*)    GFR calc non Af Amer 32 (*)    GFR calc Af Amer 37 (*)    All other components within normal limits  GLUCOSE, CAPILLARY - Abnormal; Notable for the following:    Glucose-Capillary >600 (*)    All other components within normal limits  GLUCOSE, CAPILLARY - Abnormal; Notable for the following:    Glucose-Capillary >600 (*)    All other components within normal limits  CBC  URINALYSIS, ROUTINE W REFLEX MICROSCOPIC   Imaging Review No results found.  EKG Interpretation   None       MDM   1. DKA (diabetic ketoacidoses)    DKA with anion gap of 36 and blood sugar of 944.  Patient with nausea and vomiting.  It sounds as though her blood sugars are normal a never well controlled with blood sugars consistently in the 400s.  Last admission for DKA was approximately one month ago.  Patient be started on insulin drip after IV fluid bolus at this time.  Patient will need to be admitted to the step down unit.    Lyanne Co, MD 03/23/13 [phone]

## 2013-03-23 NOTE — ED Notes (Signed)
Dr Patria Mane notified of venous blood gas values

## 2013-03-23 NOTE — H&P (Signed)
CARDIOLOGY CONSULT NOTE   Patient ID: BLIA TOTMAN MRN: [id number] DOB/AGE: Jul 21, 1960 52 y.o.  Admit date: 03/23/2013  Primary Physician   Julian Hy, MD Primary Cardiologist   Dr. Shirlee Latch  Reason for Consultation   Positive troponin  HPI:  [name] is a 52 y.o. female with a history of T1DM, diabetic nephropathy/retinopathy, CAD s/p NSTEMI 03/2010 and again in 06/2010 in the setting of DKA, ischemic cardiomyopathy, systolic CHF: EF 40% (ECHO 08/2012) and CVA while on aspirin and plavix (2012). She presented to the ED today with DKA and is being evaluated for elevated troponin. Patient in followed by Dr. Shirlee Latch. Patient had a NSTEMI in 03/2010 during a prolonged admission for DKA. Cardiac catheterization at this time revealed LAD totalled in the midportion, D1 70% stenosed (small 2-mm vessel), circumflex nonobstructive, OM1 40%, OM2 30%, RCA small and no significant disease. LAD occlusion was calcified and appeared chronic at the time of catheterization so no intervention was performed. Patient readmitted with DKA in 06/2010 and troponin found to be 24 and CKMB 69. Repeat LHC showed no change from 12/11 study.  Management was complicated by orthostatic hypotension causing syncope thought to be secondary to the combination of low cardiac output and diabetic autonomic neuropathy.  Her last echo in 5/14 showed EF 40% (stable). Dr. Shirlee Latch had to cut down on her cardiac medications due to hyperkalemia and hypotension. She is now off ACEI completely (was hypotensive with even a low dose).   Ms. Archbold currently denies CP, SOB, palpitations, n/v, abdominal pain, lightheadedness, dizziness, edema, orthopnea, or PND. She is drowsy, but she is responsive.  Past Medical History  Diagnosis Date  . Hypertension   . Vitamin D deficiency   . Coronary artery disease     a. (12/11) NSTEMI, in the setting of DKA, s/p Cath--LAD totalled in the midportion, diag 70% sten (small 2-mm ), LCx nonobst, OM1  40%, OM2 30%, RCA- no dz. chronic calcified LAD --> no intervention.  b. (06/2010) NSTEMI s/p cath showing same findings   . Ischemic cardiomyopathy   . Orthostatic hypotension     sycopal episode in 12/11 likely due to this.  Probably from combination of depressed cardiac out put and diabetic autonomic neuropathy.  . Diabetes mellitus     Insulin dependant Type I   . Hyperlipidemia   . Diabetic nephropathy   . Diabetic retinopathy   . Gastroparesis   . Depression   . Systolic heart failure     a. (08/2012) EF: 40% Mod hypokin in mid-dist-ant-sept myocard--> psuedonml L vent fill pattern, Grd 2 diast dyscfxn  . Iron deficiency anemia   . Hx MRSA infection   . Osteopenia   . Esophagitis   . NSTEMI (non-ST elevated myocardial infarction)     a. (03/2010) NSTEMI during admission for DKA  b.  . Stroke May 2012    a. (2012) residual R hand/arm weakness  b. while on ASA and plavix     Past Surgical History  Procedure Laterality Date  . Appendectomy  1980  . Tubal ligation  1987  . Cesarean section  1984  . Refractive surgery      x3    Allergies  Allergen Reactions  . Erythromycin     REACTION: GI upset  . Food Nausea Only    Green peas  . Metoclopramide Hcl     REACTION: fatique, depression    I have reviewed the patient's current medications . aspirin EC  81 mg Oral QHS  . atorvastatin  40 mg Oral q1800  . citalopram  40 mg Oral Daily  . clopidogrel  75 mg Oral Daily  . digoxin  0.125 mg Oral Daily  . enoxaparin (LOVENOX) injection  40 mg Subcutaneous Q24H  . ferrous sulfate  325 mg Oral BID  . multivitamins with iron  1 tablet Oral Daily  . [START ON 03/24/2013] pneumococcal 23 valent vaccine  0.5 mL Intramuscular Tomorrow-1000  . potassium chloride  40 mEq Oral BID   . sodium chloride 1,000 mL (03/23/13 0302)  . sodium chloride 1,000 mL (03/23/13 0529)  . dextrose 5 % and 0.45% NaCl 50 mL/hr at 03/23/13 1017  . insulin (NOVOLIN-R) infusion 0.3 mL/hr at 03/23/13  1230   ALPRAZolam, dextrose, ondansetron (ZOFRAN) IV  Prior to Admission medications   Medication Sig Start Date End Date Taking? Authorizing Provider  ALPRAZolam Prudy Feeler) 0.5 MG tablet Take 0.5 mg by mouth daily as needed for anxiety.    Yes Historical Provider, MD  aspirin EC 81 MG tablet Take 81 mg by mouth at bedtime.  10/17/10  Yes Laurey Morale, MD  CALCIUM PO Take 500 mg by mouth daily.   Yes Historical Provider, MD  carvedilol (COREG) 6.25 MG tablet Take 1 tablet (6.25 mg total) by mouth 2 (two) times daily. 09/08/12  Yes Laurey Morale, MD  citalopram (CELEXA) 20 MG tablet Take 2 tablets by mouth 2 (two) times daily. 03/21/13  Yes Historical Provider, MD  clopidogrel (PLAVIX) 75 MG tablet Take 1 tablet (75 mg total) by mouth daily. 09/08/12  Yes Laurey Morale, MD  digoxin (LANOXIN) 0.125 MG tablet 1/2 every other day 09/08/12  Yes Laurey Morale, MD  ferrous sulfate 325 (65 FE) MG tablet Take 325 mg by mouth 2 (two) times daily.    Yes Historical Provider, MD  furosemide (LASIX) 40 MG tablet Take 40 mg by mouth daily.   Yes Historical Provider, MD  insulin aspart (NOVOLOG) 100 UNIT/ML injection Inject 2-3 Units into the skin 3 (three) times daily before meals. Sliding scale   Yes Historical Provider, MD  insulin glargine (LANTUS) 100 UNIT/ML injection Inject 20 Units into the skin daily.   Yes Historical Provider, MD  Multiple Vitamins-Iron (QC DAILY MULTIVITAMINS/IRON) TABS Take 1 tablet by mouth daily.    Yes Historical Provider, MD  rosuvastatin (CRESTOR) 40 MG tablet Take 40 mg by mouth daily.  08/17/10  Yes Laurey Morale, MD  Vitamin D, Ergocalciferol, (DRISDOL) 50000 UNITS CAPS Take 50,000 Units by mouth every 7 (seven) days. Saturday   Yes Historical Provider, MD     History   Social History  . Marital Status: Single    Spouse Name: N/A    Number of Children: 1  . Years of Education: N/A   Occupational History  . unemployed    Social History Main Topics  . Smoking  status: Never Smoker   . Smokeless tobacco: Never Used  . Alcohol Use: No  . Drug Use: No  . Sexual Activity: Not on file   Other Topics Concern  . Not on file   Social History Narrative   Divorced-1 son    Family Status  Relation Status Death Age  . Father Deceased     pancreatic cancer   Family History  Problem Relation Age of Onset  . Pancreatic cancer Father   . Colon cancer Neg Hx   . Diabetes Maternal Grandfather      ROS:  Full 14 point review of systems complete and found to be negative unless listed above.  Physical Exam: Blood pressure 111/42, pulse 104, temperature 99 F (37.2 C), temperature source Oral, resp. rate 16, height 5\' 4"  (1.626 m), weight 157 lb 6.5 oz (71.4 kg), last menstrual period 03/13/2011, SpO2 98.00%.  General appearance: pale, lying flat in no distress. Oral membranes moist  Resp: clear with no wheezes, rales or rhonchi, no Kussmaul pattern  Cardio: RRR GI: soft, non-tender; bowel sounds reduced; no masses, no organomegaly  Extremities: 2+ pulses  Lymph nodes: Cervical adenopathy: no cervical lymphadenopathy  Neurologic: a bit withdrawn but alert, interactive with clear fluent speech, minor right sided weakness at baseline. No tremor  Skin warm dry, no rash  Labs:   Lab Results  Component Value Date   WBC 10.7* 03/23/2013   HGB 12.9 03/23/2013   HCT 38.1 03/23/2013   MCV 88.0 03/23/2013   PLT 270 03/23/2013   No results found for this basename: INR,  in the last 72 hours   Recent Labs Lab 03/23/13 0745  NA 143  K 3.4*  CL 102  CO2 19  BUN 29*  CREATININE 1.70*  CALCIUM 8.9  GLUCOSE 327*    Recent Labs  03/23/13 0950  TROPONINI 1.99*   Lab Results  Component Value Date   HGBA1C 12.2* 03/22/2011     Lab Results  Component Value Date   CHOL 133 07/25/2012   HDL 57.00 07/25/2012   LDLCALC 61 07/25/2012   TRIG 76.0 07/25/2012     Echo: 09/03/12  LV EF:  40% ------------------------------------------------------------ History: PMH: Acquired from the patient and from the patient's chart. Coronary artery disease. Ischemic cardiomyopathy, with congestive heart failure, with an ejection fraction of 45% by echocardiography. The dysfunction is primarily systolic. PMH: Myocardial infarction. Risk factors: Diabetes mellitus: type 1 (insulin-deficient). Dyslipidemia. ----------------------------------------------------------- Study Conclusions Left ventricle: The cavity size was normal. Wall thickness was normal. The estimated ejection fraction was 40%. There is moderate hypokinesis of the mid-distalanteroseptal myocardium. Features are consistent with a pseudonormal left ventricular filling pattern, with concomitant abnormal relaxation and increased filling pressure (grade 2 diastolic dysfunction).  Left ventricle: The cavity size was normal. Wall thickness was normal. The estimated ejection fraction was 40%. Regional wall motion abnormalities: There is moderate hypokinesis of the mid-distalanteroseptal myocardium. Features are consistent with a pseudonormal left ventricular filling pattern, with concomitant abnormal relaxation and increased filling pressure (grade 2 diastolic dysfunction). ------------------------------------------------------------ Aortic valve: Structurally normal valve. Cusp separation was normal. Doppler: Transvalvular velocity was within the normal range. There was no stenosis. No regurgitation. ------------------------------------------------------------ Aorta: The aorta was normal, not dilated, and non-diseased. ------------------------------------------------------------ Mitral valve: Structurally normal valve. Leaflet separation was normal. Doppler: Transvalvular velocity was within the normal range. There was no evidence for stenosis. No regurgitation. Peak gradient: 3mm Hg  (D). ------------------------------------------------------------ Left atrium: The atrium was normal in size. ------------------------------------------------------------ Right ventricle: The cavity size was normal. Wall thickness was normal. Systolic function was normal. ------------------------------------------------------------ Pulmonic valve: The valve appears to be grossly normal. Doppler: No significant regurgitation. ------------------------------------------------------------ Tricuspid valve: Structurally normal valve. Leaflet separation was normal. Doppler: Transvalvular velocity was within the normal range. No regurgitation. ----------------------------------------------------------- Right atrium: The atrium was normal in size. ------------------------------------------------------------ Pericardium: The pericardium was normal in appearance. ------------------------------------------------------------ Post procedure conclusions Ascending Aorta: - The aorta was normal, not dilated, and non-diseased.   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 female with a history of T1DM, diabetic nephropathy/retinopathy, CAD s/p NSTEMI 03/2010 and again in 06/2010 in the setting of DKA, ischemic cardiomyopathy, systolic CHF: EF 16% (ECHO 08/2012) and CVA while on aspirin and plavix (2012). She presented to the ED today with DKA and is being evaluated for elevated troponin.   1. NSTEMI-  in the setting of DKA. Has a history of elevated troponin during admissions for DKA (03/2011, and 06/2011). Troponin 24 during 06/2010 admission. Cr. 1.1 at that time.  -- Troponin 1.99, Cr 1.7 (stable), EKG no ST or T wave abnormalities -- Continue to monitor her Cr and troponin -- Continue aspirin, plavix, rosuvastatin, carvedilol  2. Chronic systolic congestive heart failure -- 08/2012 ECHO EF 40% stable -- Euvolemic on exam  -- Continue Lasix, carvedilol, No ACE d/t history of orthostatic hypotension on small doses -- Continue digoxin   3. DKA- significant acidosis and hyperglycemia. BG 900 to 350 or so over 6+ hrs. -- K 5.0 on admission, monitor carefully  -- Continue hydration cautiously in the setting of chronic systolic CHF -- Endocrinology following  4. CKD- diabetic nephropathy -- Cr stable for her at 1.7  5. HTN- a little soft. HR 97. Continue carvedilol  6. HLD- continue statin  7. T1DM- SSI per internal medicine -- Last hg A1c (12.2)  in 2012, will order another    Signed: Thereasa Parkin, PA-C 03/23/2013 3:07 PM   Co-Sign MD  History and all data above reviewed.  Patient examined.  I agree with the findings as above. We are called with an elevated troponin.  However, she has a history of CAD with chronic occlusion.  She has had stable CAD on the last cath and she has no active symptoms suggestive of ACS/USA.  The troponin elevation is mild.The patient exam reveals COR:RRR, no rub  ,  Lungs: Clear  ,  Abd: Positive bowel sounds, no rebound no guarding , Ext Decreased DP/PT  .  All available labs, radiology testing, previous records reviewed. Agree with documented assessment and plan.  Elevated troponin:  At this point I would suspect that this is secondary to her acute illness and known disease.  I do not suspect ACS.  I do not think that further invasive or noninvasive testing is indicated.  I have reviewed her meds and I would continue these.     Fayrene Fearing Javius Sylla  3:11 PM   03/23/2013

## 2013-03-23 NOTE — ED Notes (Addendum)
Pt. reports elevated blood sugar at home this evening ( reads = " high" at home glucometer ) with dry mouth .Respirations unlabored / alert and oriented.

## 2013-03-23 NOTE — H&P (Signed)
PCP:   Julian Hy, MD   Chief Complaint:  High sugars and vomiting  HPI: 52 YO WF with long standing poorly controlled DM type 1, CAD, prior CVA and severe orthostasis. Presented with several hours on nausea and vomiting. Sugar reportedly 162 at supper and over 900 upon arrival. Mom has been giving the pt insulin and reports nearly all sugars 100-160 over the last few weeks. BS of 354 Sat AM but down later in the day. No prodrome. No f/c/s. No urinary sxs. No pulmonary or sinus sxs. No steroids or other new meds.  At the moment, no CP or SOB. No new neurologic issues. No HA. No current nausea. No diarrhea. No abd pain. No joint pain  Review of Systems:  Review of Systems -neg except as above Past Medical History: Past Medical History  Diagnosis Date  . Hypertension   . Vitamin D deficiency   . Coronary artery disease     NSTEMI 12/11. Cardiac catheterization (12/11) showing LAD totalled in the midportion, diagonal 70% stenosed (small 2-mm vessel), circumflex nonobstructive, OM1 40%, OM2 30%, RCA samll and no significat disease.  LAD occlusion was calcified and appeared chronic at the time of cathetrization so no intervention.  . Ischemic cardiomyopathy     Echo 12/11 with EF 30% and periapical akinesis, no significant MR. RV  looked normal. QRS is not wide on ECG.  Echo (1/12):  EF 40-45%, severe hypokinesis of the anteroseptal wall, apical inferior wall, inferoseptal wall, and apex.  . Orthostatic hypotension     sycopal episode in 12/11 likely due to this.  Probably from combination of depressed cardiac out put and diabetic autonomic neuropathy.  . Diabetes mellitus     Insulin dependant Type I   . Hyperlipidemia   . Diabetic nephropathy   . Diabetic retinopathy   . Gastroparesis   . Depression   . Systolic heart failure   . Iron deficiency anemia   . Hx MRSA infection   . Osteopenia   . Esophagitis   . Heart attack   . Stroke May 2012    weakness right side - right  hand and right arm the worst   Past Surgical History  Procedure Laterality Date  . Appendectomy  1980  . Tubal ligation  1987  . Cesarean section  1984  . Refractive surgery      x3    Medications: Prior to Admission medications   Medication Sig Start Date End Date Taking? Authorizing Provider  ALPRAZolam Prudy Feeler) 0.5 MG tablet Take 0.5 mg by mouth daily as needed for anxiety.    Yes Historical Provider, MD  aspirin EC 81 MG tablet Take 81 mg by mouth at bedtime.  10/17/10  Yes Laurey Morale, MD  CALCIUM PO Take 500 mg by mouth daily.   Yes Historical Provider, MD  carvedilol (COREG) 6.25 MG tablet Take 1 tablet (6.25 mg total) by mouth 2 (two) times daily. 09/08/12  Yes Laurey Morale, MD  citalopram (CELEXA) 20 MG tablet Take 2 tablets by mouth 2 (two) times daily. 03/21/13  Yes Historical Provider, MD  clopidogrel (PLAVIX) 75 MG tablet Take 1 tablet (75 mg total) by mouth daily. 09/08/12  Yes Laurey Morale, MD  digoxin (LANOXIN) 0.125 MG tablet 1/2 every other day 09/08/12  Yes Laurey Morale, MD  ferrous sulfate 325 (65 FE) MG tablet Take 325 mg by mouth 2 (two) times daily.    Yes Historical Provider, MD  furosemide (LASIX) 40 MG  tablet Take 40 mg by mouth daily.   Yes Historical Provider, MD  insulin aspart (NOVOLOG) 100 UNIT/ML injection Inject 2-3 Units into the skin 3 (three) times daily before meals. Sliding scale   Yes Historical Provider, MD  insulin glargine (LANTUS) 100 UNIT/ML injection Inject 20 Units into the skin daily.   Yes Historical Provider, MD  Multiple Vitamins-Iron (QC DAILY MULTIVITAMINS/IRON) TABS Take 1 tablet by mouth daily.    Yes Historical Provider, MD  rosuvastatin (CRESTOR) 40 MG tablet Take 40 mg by mouth daily.  08/17/10  Yes Laurey Morale, MD  Vitamin D, Ergocalciferol, (DRISDOL) 50000 UNITS CAPS Take 50,000 Units by mouth every 7 (seven) days. Saturday   Yes Historical Provider, MD    Allergies:   Allergies  Allergen Reactions  . Erythromycin      REACTION: GI upset  . Food Nausea Only    Green peas  . Metoclopramide Hcl     REACTION: fatique, depression    Social History:  reports that she has never smoked. She has never used smokeless tobacco. She reports that she does not drink alcohol or use illicit drugs.  Family History: Family History  Problem Relation Age of Onset  . Pancreatic cancer Father   . Colon cancer Neg Hx   . Diabetes Maternal Grandfather     Physical Exam: Filed Vitals:   03/23/13 0245 03/23/13 0300 03/23/13 0400 03/23/13 0731  BP: 101/45 105/48 102/49 104/40  Pulse: 96 103 104   Temp:   97.3 F (36.3 C) 99.2 F (37.3 C)  TempSrc:   Oral Oral  Resp: 16 20 16    Height:   5\' 4"  (1.626 m)   Weight:   71.4 kg (157 lb 6.5 oz)   SpO2: 98% 97% 98%    General appearance: pale, lying flat in no distress. Sclera anicteric, EOMI without nystagmus. Conjugate gaze, face symmetric. Oral membranes moist  Resp: clear with no wheezes, rales or rhonchi, no Kussmaul pattern Cardio: sl fast, regular in what appears to be sinus tach, murmur present GI: soft, non-tender; bowel sounds reduced; no masses,  no organomegaly Extremities: sl diminished pulses, no edema Lymph nodes: Cervical adenopathy: no cervical lymphadenopathy Neurologic: a bit withdrawn but alert, interactive with clear fluent speech, minor right sided weakness at baseline. No tremor Skin warm dry, no rash  Labs on Admission:   Recent Labs  03/23/13 0113  NA 126*  K 5.0  CL 80*  CO2 10*  GLUCOSE 944*  BUN 33*  CREATININE 1.74*  CALCIUM 9.3   Sugars 600 mg/dL">600 161 WR/UE">454 098 119    Recent Labs  03/23/13 0113  WBC 10.1  HGB 14.3  HCT 44.3  MCV 93.1  PLT 281    Lab Results  Component Value Date   INR 1.09 03/27/2010      Radiological Exams on Admission: Ct Head Wo Contrast  03/08/2013   CLINICAL DATA:  Persistent headache.  EXAM: CT HEAD WITHOUT CONTRAST  TECHNIQUE: Contiguous axial images were obtained from  the base of the skull through the vertex without intravenous contrast.  COMPARISON:  02/14/2013  FINDINGS: No mass lesion. No midline shift. No acute hemorrhage or hematoma. No extra-axial fluid collections. No evidence of acute infarction. There is moderate diffuse atrophy with secondary ventricular prominence, stable. Tiny scattered areas of periventricular white matter lucency consistent with chronic small vessel ischemic disease. Osseous structures are normal.  IMPRESSION: No acute abnormalities. No change since the prior study. Chronic small vessel ischemic changes.  Moderate atrophy.   Electronically Signed   By: Geanie Cooley M.D.   On: 03/08/2013 15:41   Orders placed during the hospital encounter of 02/14/13  . EKG 12-LEAD  . EKG 12-LEAD  . EKG 12-LEAD  . EKG 12-LEAD  . EKG 12-LEAD  . EKG 12-LEAD  . EKG 12-LEAD  . EKG    Assessment/Plan Active Problems:   DKA (diabetic ketoacidoses): significant acidosis and hyperglycemia seem out of proportion with reported timeline of sxs and prior sugars. Has reduced from 900 to 350 or so over 6+ hrs. N/V have resolved. Repeat check of lytes and bicarb due soon. K started at 5.0. Need to watch carefully for fluid overload due to ischemic CMP. No obvious infection as a precipitant.  Continue low dose fluids and IV insulin for now. Check labs again Check one troponin and EKG   HYPERLIPIDEMIA: continue Rx   HYPERTENSION: BP  A bit soft, hold Rx for now   DIABETIC  RETINOPATHY: Proliferative, : stable   Coronary atherosclerosis of native coronary artery: no overt CP   ISCHEMIC CARDIOMYOPATHY:: " in May  "with an ejection fraction of 45%by echocardiography. 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

## 2013-03-23 NOTE — Progress Notes (Signed)
eLink Physician-Brief Progress Note Patient Name: [name] DOB: 05-Jul-1960 MRN: [id number]  Date of Service  03/23/2013   HPI/Events of Note  Best Practice  eICU Interventions  SCDs for DVT propy   Intervention Category Intermediate Interventions: Best-practice therapies (e.g. DVT, beta blocker, etc.)  [name] 03/23/2013, 5:11 AM

## 2013-03-24 DIAGNOSIS — N289 Disorder of kidney and ureter, unspecified: Secondary | ICD-10-CM

## 2013-03-24 DIAGNOSIS — E1110 Type 2 diabetes mellitus with ketoacidosis without coma: Secondary | ICD-10-CM

## 2013-03-24 LAB — GLUCOSE, CAPILLARY
Glucose-Capillary: 259 mg/dL — ABNORMAL HIGH (ref 70–99)
Glucose-Capillary: 184 mg/dL — ABNORMAL HIGH (ref 70–99)
Glucose-Capillary: 170 mg/dL — ABNORMAL HIGH (ref 70–99)

## 2013-03-24 LAB — URINE CULTURE: Colony Count: 50000

## 2013-03-24 LAB — CBC
HCT: 37.1 % (ref 36.0–46.0)
MCH: 30.5 pg (ref 26.0–34.0)
MCHC: 34 g/dL (ref 30.0–36.0)
MCV: 89.8 fL (ref 78.0–100.0)
RBC: 4.13 MIL/uL (ref 3.87–5.11)
RDW: 14.8 % (ref 11.5–15.5)
WBC: 11.9 10*3/uL — ABNORMAL HIGH (ref 4.0–10.5)

## 2013-03-24 LAB — COMPREHENSIVE METABOLIC PANEL
BUN: 22 mg/dL (ref 6–23)
CO2: 21 mEq/L (ref 19–32)
Calcium: 8.7 mg/dL (ref 8.4–10.5)
Chloride: 103 mEq/L (ref 96–112)
Creatinine, Ser: 1.86 mg/dL — ABNORMAL HIGH (ref 0.50–1.10)
GFR calc non Af Amer: 30 mL/min — ABNORMAL LOW (ref >90)
Glucose, Bld: 263 mg/dL — ABNORMAL HIGH (ref 70–99)
Total Bilirubin: 0.3 mg/dL (ref 0.3–1.2)

## 2013-03-24 LAB — TROPONIN I: Troponin I: 2.03 ng/mL (ref <0.30)

## 2013-03-24 MED ORDER — CARVEDILOL 3.125 MG PO TABS
3.1250 mg | ORAL_TABLET | Freq: Two times a day (BID) | ORAL | Status: DC
  Administered 2013-03-24 – 2013-03-25 (×4): 3.125 mg via ORAL
  Filled 2013-03-24 (×8): qty 1

## 2013-03-24 MED ORDER — SODIUM CHLORIDE 0.9 % IV SOLN
INTRAVENOUS | Status: DC
  Administered 2013-03-24 – 2013-03-25 (×3): via INTRAVENOUS

## 2013-03-24 MED ORDER — INSULIN GLARGINE 100 UNIT/ML ~~LOC~~ SOLN
20.0000 [IU] | SUBCUTANEOUS | Status: DC
  Administered 2013-03-24 – 2013-03-25 (×2): 20 [IU] via SUBCUTANEOUS
  Filled 2013-03-24 (×4): qty 0.2

## 2013-03-24 NOTE — Progress Notes (Signed)
Report called to [id number] VSS all meds current to time, belongs packed and sent with patient to new room.

## 2013-03-24 NOTE — Progress Notes (Signed)
Subjective: Had a good night. Sugar dropped once. Now 253. No CP or SOB. No N/V. Eating fair   Objective: Vital signs in last 24 hours: Temp:  [98.3 F (36.8 C)-99 F (37.2 C)] 98.3 F (36.8 C) (12/02 0804) Pulse Rate:  [75-87] 75 (12/02 0408) Resp:  [13-17] 17 (12/02 0408) BP: (98-111)/(42-60) 111/54 mmHg (12/02 0804) SpO2:  [98 %-99 %] 98 % (12/02 0408)  Intake/Output from previous day: 12/01 0701 - 12/02 0700 In: 1370 [P.O.:120; I.V.:1250] Out: 1200 [Urine:1200] Intake/Output this shift: Total I/O In: 50 [I.V.:50] Out: -   Lying flat in no distress. Face sl asymmetric, anicteric, lungs clear. Ht regular  abd soft NT Awake alert mentating well. hemiweakness unchanged.  Lab Results   Recent Labs  03/23/13 0745 03/24/13 0406  WBC 10.7* 11.9*  RBC 4.33 4.13  HGB 12.9 12.6  HCT 38.1 37.1  MCV 88.0 89.8  MCH 29.8 30.5  RDW 13.8 14.8  PLT 270 241    Recent Labs  03/23/13 1530 03/24/13 0406  NA 144 137  K 3.9 4.3  CL 107 103  CO2 22 21  GLUCOSE 100* 263*  BUN 24* 22  CREATININE 1.75* 1.86*  CALCIUM 8.7 8.7  Results for DENAISHA, SWANGO (MRN [id number]) as of 03/24/2013 08:46  Ref. Range 03/23/2013 22:50 03/24/2013 04:00  Troponin I Latest Range: <0.30 ng/mL 1.32 (HH) 2.03 (HH)    Studies/Results: No results found.  Scheduled Meds: . aspirin EC  81 mg Oral QHS  . atorvastatin  40 mg Oral q1800  . carvedilol  3.125 mg Oral BID WC  . citalopram  40 mg Oral Daily  . clopidogrel  75 mg Oral Daily  . digoxin  0.125 mg Oral Daily  . enoxaparin (LOVENOX) injection  40 mg Subcutaneous Q24H  . ferrous sulfate  325 mg Oral BID  . insulin aspart  0-5 Units Subcutaneous QHS  . insulin aspart  0-9 Units Subcutaneous TID WC  . insulin aspart  3 Units Subcutaneous TID WC  . insulin glargine  20 Units Subcutaneous Q24H  . multivitamins with iron  1 tablet Oral Daily  . pneumococcal 23 valent vaccine  0.5 mL Intramuscular Tomorrow-1000  . potassium chloride  40 mEq Oral BID    Continuous Infusions: . sodium chloride 1,000 mL (03/23/13 0302)  . sodium chloride 1,000 mL (03/23/13 0529)  . dextrose 5 % and 0.45% NaCl 50 mL/hr at 03/24/13 0400  . insulin (NOVOLIN-R) infusion 0.3 mL/hr at 03/23/13 1230   PRN Meds:ALPRAZolam, dextrose  Assessment/Plan: DKA: hyperglycemia and acidosis have cleared. Tolerating diet. Increase lantus to usual dose.  Move to tele NSTEMI: Seen by cards, ECHO planned.likely no other intervention DM NEPRHOPATHY/AKI: Cr still up at 1.86, continue low dose fluids ISCHEMIC CMP/CHF: ECHO planned. 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 Will look at ECHO to see if dig is needed HYPOKALMIA: improved  LOS: 1 day   [name] ALAN 03/24/2013, 8:41 AM

## 2013-03-24 NOTE — Progress Notes (Signed)
Patient ID: [name], female   DOB: 1960/12/30, 52 y.o.   MRN: [id number]   SUBJECTIVE: No complaints this morning, blood glucose improved, troponin 2.  She has not had any chest pain or dyspnea.  She felt dehydrated yesterday which triggered evaluation.   [name] aspirin EC  81 mg Oral QHS  . atorvastatin  40 mg Oral q1800  . carvedilol  3.125 mg Oral BID WC  . citalopram  40 mg Oral Daily  . clopidogrel  75 mg Oral Daily  . digoxin  0.125 mg Oral Daily  . enoxaparin (LOVENOX) injection  40 mg Subcutaneous Q24H  . ferrous sulfate  325 mg Oral BID  . insulin aspart  0-5 Units Subcutaneous QHS  . insulin aspart  0-9 Units Subcutaneous TID WC  . insulin aspart  3 Units Subcutaneous TID WC  . insulin glargine  15 Units Subcutaneous Q24H  . multivitamins with iron  1 tablet Oral Daily  . pneumococcal 23 valent vaccine  0.5 mL Intramuscular Tomorrow-1000  . potassium chloride  40 mEq Oral BID     Filed Vitals:   03/23/13 2025 03/23/13 2305 03/24/13 0408 03/24/13 0804  BP: 98/60 111/52 103/46 111/54  Pulse: 87 78 75   Temp: 98.6 F (37 C) 98.5 F (36.9 C) 98.3 F (36.8 C) 98.3 F (36.8 C)  TempSrc: Oral Oral Oral Oral  Resp: 13 14 17    Height:      Weight:      SpO2: 99% 98% 98%     Intake/Output Summary (Last 24 hours) at 03/24/13 0836 Last data filed at 03/24/13 0800  Gross per 24 hour  Intake   1320 ml  Output   1200 ml  Net    120 ml    LABS: Basic Metabolic Panel:  Recent Labs  40/98/11 1530 03/24/13 0406  NA 144 137  K 3.9 4.3  CL 107 103  CO2 22 21  GLUCOSE 100* 263*  BUN 24* 22  CREATININE 1.75* 1.86*  CALCIUM 8.7 8.7   Liver Function Tests:  Recent Labs  03/24/13 0406  AST 18  ALT 8  ALKPHOS 66  BILITOT 0.3  PROT 6.0  ALBUMIN 2.7*   No results found for this basename: LIPASE, AMYLASE,  in the last 72 hours CBC:  Recent Labs  03/23/13 0745 03/24/13 0406  WBC 10.7* 11.9*  HGB 12.9 12.6  HCT 38.1 37.1  MCV 88.0 89.8  PLT 270 241    Cardiac Enzymes:  Recent Labs  03/23/13 1530 03/23/13 2250 03/24/13 0400  TROPONINI 3.75* 1.32* 2.03*   BNP: No components found with this basename: POCBNP,  D-Dimer: No results found for this basename: DDIMER,  in the last 72 hours Hemoglobin A1C:  Recent Labs  03/23/13 1530  HGBA1C 13.2*   Fasting Lipid Panel: No results found for this basename: CHOL, HDL, LDLCALC, TRIG, CHOLHDL, LDLDIRECT,  in the last 72 hours Thyroid Function Tests: No results found for this basename: TSH, T4TOTAL, FREET3, T3FREE, THYROIDAB,  in the last 72 hours Anemia Panel: No results found for this basename: VITAMINB12, FOLATE, FERRITIN, TIBC, IRON, RETICCTPCT,  in the last 72 hours  RADIOLOGY: Ct Head Wo Contrast  03/08/2013   CLINICAL DATA:  Persistent headache.  EXAM: CT HEAD WITHOUT CONTRAST  TECHNIQUE: Contiguous axial images were obtained from the base of the skull through the vertex without intravenous contrast.  COMPARISON:  02/14/2013  FINDINGS: No mass lesion. No midline shift. No acute hemorrhage or hematoma. No extra-axial  fluid collections. No evidence of acute infarction. There is moderate diffuse atrophy with secondary ventricular prominence, stable. Tiny scattered areas of periventricular white matter lucency consistent with chronic small vessel ischemic disease. Osseous structures are normal.  IMPRESSION: No acute abnormalities. No change since the prior study. Chronic small vessel ischemic changes. Moderate atrophy.   Electronically Signed   By: Geanie Cooley M.D.   On: 03/08/2013 15:41    PHYSICAL EXAM General: NAD Neck: No JVD, no thyromegaly or thyroid nodule.  Lungs: Clear to auscultation bilaterally with normal respiratory effort. CV: Nondisplaced PMI.  Heart regular S1/S2, no S3/S4, no murmur.  No peripheral edema.  No carotid bruit.  Normal pedal pulses.  Abdomen: Soft, nontender, no hepatosplenomegaly, no distention.  Neurologic: Alert and oriented x 3.  Psych: Normal  affect. Extremities: No clubbing or cyanosis.   TELEMETRY: Reviewed telemetry pt in NSR  ASSESSMENT AND PLAN: 52 yo with history of type I diabetes, CAD, and ischemic cardiomyopathy presented with DKA.  She was noted to have elevated troponin.  1. Elevated troponin: She has a known chronically occluded mid LAD.  Possible demand ischemia with dehydration and perhaps some hypotension.  No chest pain or dyspnea.  Similar episode in 2012 with DKA but troponin higher at that time.  LHC was done with no new CAD.   - Echo to assess LV function.  - ASA, statin, can restart Coreg at 3.125 mg bid.  - Would not do cardiac cath at this point with no symptoms, mild troponin elevation, and AKI.  Trend troponin to peak.  2. Ischemic cardiomyopathy: EF 40% in the past.  She appears euvolemic.  She has not been on ACEI or spironolactone due to hyperkalemia and hypotension.  As above, can restart low dose Coreg.  Would check digoxin level in the morning.  3. AKI: Creatinine up from baseline, likely due to DKA with dehydration.  Gentle hydration being careful not to overhydrate.  4. Diabetes: Per primary service.   Marca Ancona 03/24/2013 8:42 AM

## 2013-03-25 DIAGNOSIS — I2510 Atherosclerotic heart disease of native coronary artery without angina pectoris: Secondary | ICD-10-CM

## 2013-03-25 LAB — GLUCOSE, CAPILLARY
Glucose-Capillary: 196 mg/dL — ABNORMAL HIGH (ref 70–99)
Glucose-Capillary: 183 mg/dL — ABNORMAL HIGH (ref 70–99)
Glucose-Capillary: 139 mg/dL — ABNORMAL HIGH (ref 70–99)

## 2013-03-25 LAB — BASIC METABOLIC PANEL
CO2: 16 mEq/L — ABNORMAL LOW (ref 19–32)
Calcium: 9 mg/dL (ref 8.4–10.5)
Calcium: 8.9 mg/dL (ref 8.4–10.5)
Chloride: 108 mEq/L (ref 96–112)
Creatinine, Ser: 2.27 mg/dL — ABNORMAL HIGH (ref 0.50–1.10)
GFR calc Af Amer: 32 mL/min — ABNORMAL LOW (ref >90)
GFR calc non Af Amer: 23 mL/min — ABNORMAL LOW (ref >90)
GFR calc non Af Amer: 28 mL/min — ABNORMAL LOW (ref >90)
Potassium: 6.1 mEq/L — ABNORMAL HIGH (ref 3.5–5.1)
Sodium: 141 mEq/L (ref 135–145)
Sodium: 134 mEq/L — ABNORMAL LOW (ref 135–145)

## 2013-03-25 LAB — DIGOXIN LEVEL: Digoxin Level: 0.7 ng/mL — ABNORMAL LOW (ref 0.8–2.0)

## 2013-03-25 MED ORDER — PERFLUTREN LIPID MICROSPHERE
1.0000 mL | INTRAVENOUS | Status: AC | PRN
  Administered 2013-03-25: 2 mL via INTRAVENOUS
  Filled 2013-03-25: qty 10

## 2013-03-25 MED ORDER — POTASSIUM CHLORIDE CRYS ER 20 MEQ PO TBCR
20.0000 meq | EXTENDED_RELEASE_TABLET | Freq: Every day | ORAL | Status: DC
  Administered 2013-03-25 – 2013-03-26 (×2): 20 meq via ORAL
  Filled 2013-03-25: qty 1

## 2013-03-25 MED ORDER — ENOXAPARIN SODIUM 30 MG/0.3ML ~~LOC~~ SOLN
30.0000 mg | SUBCUTANEOUS | Status: DC
  Administered 2013-03-25: 30 mg via SUBCUTANEOUS
  Filled 2013-03-25 (×2): qty 0.3

## 2013-03-25 NOTE — Clinical Documentation Improvement (Signed)
THIS DOCUMENT IS NOT A PERMANENT PART OF THE MEDICAL RECORD  Please update your documentation with the medical record to reflect your response to this query. If you need help knowing how to do this please call [phone].  03/25/13   Dear Dr. Evlyn Kanner,  Per Notes patient has "CKD". Please indicate the stage of CKD to clarify this patient's severity of illness and risk of mortality. Thank you.  Possible Clinical Conditions?  - CKD Stage I -  GFR > OR = 90 - CKD Stage II - GFR 60-80 - CKD Stage III - GFR 30-59 - CKD Stage IV - GFR 15-29 - CKD Stage V - GFR < 15 - ESRD (End Stage Renal Disease) - Other condition (please specify)  Supporting Information: - Risk Factors: DM I uncontrolled with recurrent DKA, DM nephropathy - Creatinine level: 12/1: 1.75, 12/2: 1.86, 12/3:2.27 - BUN: 12/1:24, 12/2: 22, 12/3: 21 - Calculated GFR: 12/3: 23  You may use possible, probable, or suspect with inpatient documentation. possible, probable, suspected diagnoses MUST be documented at the time of discharge  Reviewed:   See d/c summary Thank You,  Beverley Fiedler RN Clinical Documentation Specialist: [phone] Health Information Management [place]

## 2013-03-25 NOTE — Progress Notes (Addendum)
Paged MD on call for Dr. Evlyn Kanner. Alerted him K+ 6.1. Was told to give nightly insulin, this will help bring K+ down. Will continue to monitor patient.   2127 CBG 139, no insulin will be given   Ulice Dash, RN

## 2013-03-25 NOTE — Progress Notes (Signed)
Subjective: Had a fair night but didn't sleep much. No pain, no N/V. No CP. Bowels OK   Objective: Vital signs in last 24 hours: Temp:  [98.2 F (36.8 C)-99.5 F (37.5 C)] 98.2 F (36.8 C) (12/03 0559) Pulse Rate:  [71-85] 71 (12/03 0559) Resp:  [18] 18 (12/02 1156) BP: (103-123)/(58-74) 123/58 mmHg (12/03 0559) SpO2:  [98 %-99 %] 98 % (12/03 0559) Weight:  [70.308 kg (155 lb)-71.006 kg (156 lb 8.6 oz)] 71.006 kg (156 lb 8.6 oz) (12/03 0559)  Intake/Output from previous day: 12/02 0701 - 12/03 0700 In: 1287.5 [I.V.:1287.5] Out: -  Intake/Output this shift: Total I/O In: 240 [P.O.:240] Out: 350 [Urine:350]  Sitting up in no distress. Oral membranes moist. Lungs clear. Ht regular with sys murmur abd soft NT right hand weak, speech clear  Lab Results   Recent Labs  03/23/13 0745 03/24/13 0406  WBC 10.7* 11.9*  RBC 4.33 4.13  HGB 12.9 12.6  HCT 38.1 37.1  MCV 88.0 89.8  MCH 29.8 30.5  RDW 13.8 14.8  PLT 270 241    Recent Labs  03/24/13 0406 03/25/13 0528  NA 137 141  K 4.3 4.6  CL 103 109  CO2 21 23  GLUCOSE 263* 156*  BUN 22 21  CREATININE 1.86* 2.27*  CALCIUM 8.7 9.0    Studies/Results: No results found.  Scheduled Meds: . aspirin EC  81 mg Oral QHS  . atorvastatin  40 mg Oral q1800  . carvedilol  3.125 mg Oral BID WC  . citalopram  40 mg Oral Daily  . clopidogrel  75 mg Oral Daily  . digoxin  0.125 mg Oral Daily  . enoxaparin (LOVENOX) injection  30 mg Subcutaneous Q24H  . insulin aspart  0-5 Units Subcutaneous QHS  . insulin aspart  0-9 Units Subcutaneous TID WC  . insulin aspart  3 Units Subcutaneous TID WC  . insulin glargine  20 Units Subcutaneous Q24H  . multivitamins with iron  1 tablet Oral Daily  . potassium chloride  20 mEq Oral Daily   Continuous Infusions: . sodium chloride 75 mL/hr at 03/25/13 0139   PRN Meds:ALPRAZolam  Assessment/Plan: DKA/DM 1: doing better. FBS good RENAL INSUFFIC/NEPHROPATHY: quite a bit worse today,  increase fluids, consider [place] of kidneys NON STEMI: To have an ECHO HYPERTENSION: BP OK DEPRESSION: seems OK POST CVA: still has some weakness    LOS: 2 days   Venesha Petraitis ALAN 03/25/2013, 9:32 AM

## 2013-03-25 NOTE — Progress Notes (Signed)
Echocardiogram 2D Echocardiogram with Definity has been performed.  [name] 03/25/2013, 11:39 AM

## 2013-03-25 NOTE — Progress Notes (Signed)
Patient ID: [name], female   DOB: 1960-11-24, 52 y.o.   MRN: [id number]   SUBJECTIVE: No chest pain or dyspnea.  TnI peaked at 2.  Blood glucose better but creatinine is up.    [name] aspirin EC  81 mg Oral QHS  . atorvastatin  40 mg Oral q1800  . carvedilol  3.125 mg Oral BID WC  . citalopram  40 mg Oral Daily  . clopidogrel  75 mg Oral Daily  . digoxin  0.125 mg Oral Daily  . enoxaparin (LOVENOX) injection  40 mg Subcutaneous Q24H  . insulin aspart  0-5 Units Subcutaneous QHS  . insulin aspart  0-9 Units Subcutaneous TID WC  . insulin aspart  3 Units Subcutaneous TID WC  . insulin glargine  20 Units Subcutaneous Q24H  . multivitamins with iron  1 tablet Oral Daily  . potassium chloride  20 mEq Oral Daily     Filed Vitals:   03/24/13 1156 03/24/13 1847 03/24/13 2132 03/25/13 0559  BP: 103/70 123/58 121/74 123/58  Pulse: 85 78 73 71  Temp: 98.6 F (37 C)  99.5 F (37.5 C) 98.2 F (36.8 C)  TempSrc:   Oral Oral  Resp: 18     Height: 5\' 4"  (1.626 m)     Weight: 155 lb (70.308 kg)   156 lb 8.6 oz (71.006 kg)  SpO2: 99%  98% 98%    Intake/Output Summary (Last 24 hours) at 03/25/13 0811 Last data filed at 03/25/13 0600  Gross per 24 hour  Intake 1237.5 ml  Output      0 ml  Net 1237.5 ml    LABS: Basic Metabolic Panel:  Recent Labs  40/98/11 0406 03/25/13 0528  NA 137 141  K 4.3 4.6  CL 103 109  CO2 21 23  GLUCOSE 263* 156*  BUN 22 21  CREATININE 1.86* 2.27*  CALCIUM 8.7 9.0   Liver Function Tests:  Recent Labs  03/24/13 0406  AST 18  ALT 8  ALKPHOS 66  BILITOT 0.3  PROT 6.0  ALBUMIN 2.7*   No results found for this basename: LIPASE, AMYLASE,  in the last 72 hours CBC:  Recent Labs  03/23/13 0745 03/24/13 0406  WBC 10.7* 11.9*  HGB 12.9 12.6  HCT 38.1 37.1  MCV 88.0 89.8  PLT 270 241   Cardiac Enzymes:  Recent Labs  03/23/13 2250 03/24/13 0400 03/24/13 1235  TROPONINI 1.32* 2.03* 1.69*   BNP: No components found with this basename:  POCBNP,  D-Dimer: No results found for this basename: DDIMER,  in the last 72 hours Hemoglobin A1C:  Recent Labs  03/23/13 1530  HGBA1C 13.2*   Fasting Lipid Panel: No results found for this basename: CHOL, HDL, LDLCALC, TRIG, CHOLHDL, LDLDIRECT,  in the last 72 hours Thyroid Function Tests: No results found for this basename: TSH, T4TOTAL, FREET3, T3FREE, THYROIDAB,  in the last 72 hours Anemia Panel: No results found for this basename: VITAMINB12, FOLATE, FERRITIN, TIBC, IRON, RETICCTPCT,  in the last 72 hours  RADIOLOGY: Ct Head Wo Contrast  03/08/2013   CLINICAL DATA:  Persistent headache.  EXAM: CT HEAD WITHOUT CONTRAST  TECHNIQUE: Contiguous axial images were obtained from the base of the skull through the vertex without intravenous contrast.  COMPARISON:  02/14/2013  FINDINGS: No mass lesion. No midline shift. No acute hemorrhage or hematoma. No extra-axial fluid collections. No evidence of acute infarction. There is moderate diffuse atrophy with secondary ventricular prominence, stable. Tiny scattered areas of periventricular  white matter lucency consistent with chronic small vessel ischemic disease. Osseous structures are normal.  IMPRESSION: No acute abnormalities. No change since the prior study. Chronic small vessel ischemic changes. Moderate atrophy.   Electronically Signed   By: Geanie Cooley M.D.   On: 03/08/2013 15:41    PHYSICAL EXAM General: NAD Neck: No JVD, no thyromegaly or thyroid nodule.  Lungs: Clear to auscultation bilaterally with normal respiratory effort. CV: Nondisplaced PMI.  Heart regular S1/S2, no S3/S4, no murmur.  No peripheral edema.  No carotid bruit.  Normal pedal pulses.  Abdomen: Soft, nontender, no hepatosplenomegaly, no distention.  Neurologic: Alert and oriented x 3.  Psych: Normal affect. Extremities: No clubbing or cyanosis.   TELEMETRY: Reviewed telemetry pt in NSR  ASSESSMENT AND PLAN: 52 yo with history of type I diabetes, CAD, and  ischemic cardiomyopathy presented with DKA.  She was noted to have elevated troponin.  1. Elevated troponin: She has a known chronically occluded mid LAD.  Possible demand ischemia with dehydration and perhaps some hypotension.  No chest pain or dyspnea.  Similar episode in 2012 with DKA but troponin higher at that time.  LHC was done with no new CAD.  This admission, TnI peaked at 2.  - Echo to assess LV function - awaiting  - ASA, statin, Coreg 3.125 mg bid.  - Would not do cardiac cath at this point with no symptoms, mild troponin elevation, and AKI.   2. Ischemic cardiomyopathy: EF 40% in the past.  She appears euvolemic.  She has not been on ACEI or spironolactone due to hyperkalemia and hypotension.  Back on Coreg.  Digoxin level ok.   3. AKI: Creatinine up from baseline, likely due to DKA with dehydration.  Continue gentle hydration. 4. Diabetes: Per primary service.   Marca Ancona 03/25/2013 8:11 AM

## 2013-03-26 LAB — GLUCOSE, CAPILLARY: Glucose-Capillary: 164 mg/dL — ABNORMAL HIGH (ref 70–99)

## 2013-03-26 LAB — BASIC METABOLIC PANEL
Calcium: 8.7 mg/dL (ref 8.4–10.5)
Creatinine, Ser: 1.79 mg/dL — ABNORMAL HIGH (ref 0.50–1.10)
GFR calc non Af Amer: 31 mL/min — ABNORMAL LOW (ref >90)
Glucose, Bld: 59 mg/dL — ABNORMAL LOW (ref 70–99)
Sodium: 143 mEq/L (ref 135–145)

## 2013-03-26 MED ORDER — DIGOXIN 0.0625 MG HALF TABLET: 0.0625 mg | ORAL_TABLET | ORAL | Status: DC

## 2013-03-26 MED ORDER — CARVEDILOL 6.25 MG PO TABS: 3.1250 mg | ORAL_TABLET | Freq: Two times a day (BID) | ORAL | Status: DC

## 2013-03-26 MED ORDER — ENOXAPARIN SODIUM 40 MG/0.4ML ~~LOC~~ SOLN: 40.0000 mg | SUBCUTANEOUS | Status: DC

## 2013-03-26 MED ORDER — CARVEDILOL 6.25 MG PO TABS
6.2500 mg | ORAL_TABLET | Freq: Two times a day (BID) | ORAL | Status: DC
  Filled 2013-03-26 (×2): qty 1

## 2013-03-26 NOTE — Discharge Summary (Addendum)
DISCHARGE SUMMARY  [name]  MR#: [id number]  DOB:[birth year]  Date of Admission: 03/23/2013 Date of Discharge: 03/26/2013  Attending Physician:Laurinda Carreno [name]  Patient's XLK:[id number] Catherine Diener, MD  Consults:Treatment Team:  Dory Peru [facility], MD    Discharge Diagnoses: Active Problems: DIABETIC KETOACIDOSIS, moderate, resolved   HYPERLIPIDEMIA, stable on medications   HYPERTENSION, now on less Coreg   DIABETIC  RETINOPATHY, proliferative, stable   Coronary atherosclerosis of native coronary artery, with repeat echo this visit   ISCHEMIC CARDIOMYOPATHY   Chronic systolic heart failure, without flare   Dehydration, resolved   Acute renal insufficiency, improved close to her baseline. 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     aspirin EC 81 MG tablet  Take 81 mg by mouth at bedtime.     CALCIUM PO  Take 500 mg by mouth daily.     carvedilol 6.25 MG tablet  Commonly known as:  COREG  Take 0.5 tablets (3.125 mg total) by mouth 2 (two) times daily.     citalopram 20 MG tablet  Commonly known as:  CELEXA  Take 2 tablets by mouth 2 (two) times daily.     clopidogrel 75 MG tablet  Commonly known as:  PLAVIX  Take 1 tablet (75 mg total) by mouth daily.     CRESTOR 40 MG tablet  Generic drug:  rosuvastatin  Take 40 mg by mouth daily.     digoxin 0.125 MG tablet  Commonly known as:  LANOXIN  1/2 every other day     ferrous sulfate  325 (65 FE) MG tablet  Take 325 mg by mouth 2 (two) times daily.     furosemide 40 MG tablet  Commonly known as:  LASIX  Take 40 mg by mouth daily.     insulin aspart 100 UNIT/ML injection  Commonly known as:  novoLOG  Inject 2-3 Units into the skin 3 (three) times daily before meals. Sliding scale     insulin glargine 100 UNIT/ML injection  Commonly known as:  LANTUS  Inject 20 Units into the skin daily.     QC DAILY MULTIVITAMINS/IRON Tabs  Take 1 tablet by mouth daily.     Vitamin D (Ergocalciferol) 50000 UNITS Caps capsule  Commonly known as:  DRISDOL  Take 50,000 Units by mouth every 7 (seven) days. Saturday        Hospital Procedures: Ct Head Wo Contrast  03/08/2013   CLINICAL DATA:  Persistent headache.  EXAM: CT HEAD WITHOUT CONTRAST  TECHNIQUE: Contiguous axial images were obtained from the base of the skull through the vertex without intravenous contrast.  COMPARISON:  02/14/2013  FINDINGS: No mass lesion. No midline shift. No acute hemorrhage or hematoma. No extra-axial fluid collections. No evidence of acute infarction. There is moderate diffuse atrophy with secondary ventricular prominence, stable. Tiny scattered areas of periventricular white matter lucency consistent with chronic small vessel ischemic disease. Osseous structures are normal.  IMPRESSION: No acute abnormalities. No change since the prior study. Chronic small vessel ischemic changes. Moderate  atrophy.   Electronically Signed   By: Geanie Cooley M.D.   On: 03/08/2013 15:41    History of Present Illness: High sugars and vomiting  Hospital Course: This is a 52 year old white female long-standing type 1 diabetes with multiple complications as well as significant cardiac disease including ischemic  Cardiomyopathy. She presented with rapid onset diabetic ketoacidosis with bicarbonate as low as 10. Her sugar was in excess of 900 having been only 168 earlier in the day. She was treated with an IV insulin drip by  the glucose stabilizer protocol. She did well and came down appropriately without hypoglycemia oral pursuit. She did have mild hypokalemia on hospital day 3. Troponin was checked to her cardiac history and found to be elevated. The peak was around 2. She was seen by cardiology an echocardiogram was repeated showing no significant changes. No interventions from a cardiac standpoint are planned. The patient also has some acute renal insufficiency that worsened on hospital day 3. With extra hydration this is improved. She's had no congestive heart failure. Her bowels are working well. She's advanced her diet and doing well. She is ambulated a bit. She's getting her strength back. She has no abdominal pain. I think she is stable to go home. Medications were reviewed. I talked with Dr. Ronelle Nigh is going to reevaluate need for digoxin. There is no evidence of actual interaction between this and her setup and at the present although this is theoretically possible.  Day of Discharge Exam BP 136/63  Pulse 72  Temp(Src) 97.4 F (36.3 C) (Oral)  Resp 18  Ht 5\' 4"  (1.626 m)  Wt 71.006 kg (156 lb 8.6 oz)  BMI 26.86 kg/m2  SpO2 99%  LMP 03/13/2011  Physical Exam: General appearance: Sitting up in no distress face is mildly asymmetric. Oral mucous members are moist. Neck is supple without bruits.  Resp: Clear with no wheezes, rales, or rhonchi. No accessory muscles in use Cardio: Regular with systolic murmur GI: soft, non-tender; bowel sounds normal; no masses,  no organomegaly Extremities: Slightly diminished pulses with no edema Neuro: Right hand is somewhat weak. Speech is clear and fluent. Mood is appropriate. No tremor is present  Discharge Labs:  Recent Labs  03/25/13 1300 03/26/13 0344  NA 134* 143  K 6.1* 4.2  CL 108 111  CO2 16* 23  GLUCOSE 234* 59*  BUN 18 15  CREATININE 1.95* 1.79*  CALCIUM 8.9 8.7  Results for [name], [name] (MRN [id number]) as of 03/26/2013 08:12  Ref. Range 03/24/2013  04:06 03/24/2013 12:35 03/25/2013 05:28  Sodium Latest Range: 135-145 mEq/L 137  141  Potassium Latest Range: 3.5-5.1 mEq/L 4.3  4.6  Chloride Latest Range: 96-112 mEq/L 103  109  CO2 Latest Range: 19-32 mEq/L 21  23  BUN Latest Range: 6-23 mg/dL 22  21  Creatinine Latest Range: 0.50-1.10 mg/dL 1.47 (H)  8.29 (H)  Calcium Latest Range: 8.4-10.5 mg/dL 8.7  9.0  GFR calc non Af Amer Latest Range: >90 mL/min 30 (L)  23 (L)  GFR calc Af Amer Latest Range: >90 mL/min 34 (L)  27 (L)  Glucose Latest Range: 70-99 mg/dL 562 (H)  130 (H)  Alkaline Phosphatase Latest Range: 39-117 U/L 66    Albumin Latest Range: 3.5-5.2 g/dL 2.7 (L)    AST Latest Range: 0-37 U/L 18    ALT Latest Range: 0-35 U/L 8    Total Protein Latest Range: 6.0-8.3 g/dL 6.0    Total Bilirubin Latest Range: 0.3-1.2 mg/dL 0.3  Recent Labs  03/24/13 0406  AST 18  ALT 8  ALKPHOS 66  BILITOT 0.3  PROT 6.0  ALBUMIN 2.7*    Recent Labs  03/24/13 0406  WBC 11.9*  HGB 12.6  HCT 37.1  MCV 89.8  PLT 241  Left ventricle: The cavity size was normal. Wall thickness was normal. Systolic function was severely reduced. The estimated ejection fraction was in the range of 25% to 30%. Akinesis of the mid-distalanteroseptal and apical myocardium.   EKG 12/1: Normal sinus rhythm Nonspecific T wave abnormality Prolonged QT Abnormal ECG since last tracing no significant change   Recent Labs  03/23/13 2250 03/24/13 0400 03/24/13 1235  TROPONINI 1.32* 2.03* 1.69*      Discharge instructions: Return to blood sugar management as before with Lantus and Humalog.   Disposition: To home  Follow-up Appts: Follow-up with Dr. Evlyn Kanner at [facility] in 1 week.  Call for appointment.  Condition on Discharge: Improved  Tests Needing Follow-up: None  Signed: Desarae Placide [name] 03/26/2013, 8:16 AM

## 2013-03-26 NOTE — Progress Notes (Signed)
Patient ID: [name], female   DOB: 03-May-1960, 52 y.o.   MRN: [id number]   SUBJECTIVE: No chest pain or dyspnea.  TnI peaked at 2.  Creatinine better today.   [name] aspirin EC  81 mg Oral QHS  . atorvastatin  40 mg Oral q1800  . carvedilol  3.125 mg Oral BID WC  . citalopram  40 mg Oral Daily  . clopidogrel  75 mg Oral Daily  . digoxin  0.125 mg Oral Daily  . enoxaparin (LOVENOX) injection  30 mg Subcutaneous Q24H  . insulin aspart  0-5 Units Subcutaneous QHS  . insulin aspart  0-9 Units Subcutaneous TID WC  . insulin aspart  3 Units Subcutaneous TID WC  . insulin glargine  20 Units Subcutaneous Q24H  . multivitamins with iron  1 tablet Oral Daily  . potassium chloride  20 mEq Oral Daily     Filed Vitals:   03/25/13 1300 03/25/13 1714 03/25/13 2056 03/26/13 0500  BP: 127/57 120/60 132/76 136/63  Pulse: 72 71 70 72  Temp: 98 F (36.7 C)  98.5 F (36.9 C) 97.4 F (36.3 C)  TempSrc: Oral  Oral Oral  Resp:   18 18  Height:      Weight:      SpO2: 100%  97% 99%    Intake/Output Summary (Last 24 hours) at 03/26/13 0907 Last data filed at 03/25/13 1827  Gross per 24 hour  Intake    360 ml  Output    400 ml  Net    -40 ml    LABS: Basic Metabolic Panel:  Recent Labs  40/98/11 1300 03/26/13 0344  NA 134* 143  K 6.1* 4.2  CL 108 111  CO2 16* 23  GLUCOSE 234* 59*  BUN 18 15  CREATININE 1.95* 1.79*  CALCIUM 8.9 8.7   Liver Function Tests:  Recent Labs  03/24/13 0406  AST 18  ALT 8  ALKPHOS 66  BILITOT 0.3  PROT 6.0  ALBUMIN 2.7*   No results found for this basename: LIPASE, AMYLASE,  in the last 72 hours CBC:  Recent Labs  03/24/13 0406  WBC 11.9*  HGB 12.6  HCT 37.1  MCV 89.8  PLT 241   Cardiac Enzymes:  Recent Labs  03/23/13 2250 03/24/13 0400 03/24/13 1235  TROPONINI 1.32* 2.03* 1.69*   BNP: No components found with this basename: POCBNP,  D-Dimer: No results found for this basename: DDIMER,  in the last 72 hours Hemoglobin  A1C:  Recent Labs  03/23/13 1530  HGBA1C 13.2*   Fasting Lipid Panel: No results found for this basename: CHOL, HDL, LDLCALC, TRIG, CHOLHDL, LDLDIRECT,  in the last 72 hours Thyroid Function Tests: No results found for this basename: TSH, T4TOTAL, FREET3, T3FREE, THYROIDAB,  in the last 72 hours Anemia Panel: No results found for this basename: VITAMINB12, FOLATE, FERRITIN, TIBC, IRON, RETICCTPCT,  in the last 72 hours  RADIOLOGY: Ct Head Wo Contrast  03/08/2013   CLINICAL DATA:  Persistent headache.  EXAM: CT HEAD WITHOUT CONTRAST  TECHNIQUE: Contiguous axial images were obtained from the base of the skull through the vertex without intravenous contrast.  COMPARISON:  02/14/2013  FINDINGS: No mass lesion. No midline shift. No acute hemorrhage or hematoma. No extra-axial fluid collections. No evidence of acute infarction. There is moderate diffuse atrophy with secondary ventricular prominence, stable. Tiny scattered areas of periventricular white matter lucency consistent with chronic small vessel ischemic disease. Osseous structures are normal.  IMPRESSION: No acute abnormalities.  No change since the prior study. Chronic small vessel ischemic changes. Moderate atrophy.   Electronically Signed   By: Geanie Cooley M.D.   On: 03/08/2013 15:41    PHYSICAL EXAM General: NAD Neck: No JVD, no thyromegaly or thyroid nodule.  Lungs: Clear to auscultation bilaterally with normal respiratory effort. CV: Nondisplaced PMI.  Heart regular S1/S2, no S3/S4, no murmur.  No peripheral edema.  No carotid bruit.  Normal pedal pulses.  Abdomen: Soft, nontender, no hepatosplenomegaly, no distention.  Neurologic: Alert and oriented x 3.  Psych: Normal affect. Extremities: No clubbing or cyanosis.   TELEMETRY: Reviewed telemetry pt in [facility]  ASSESSMENT AND PLAN: 52 yo with history of type I diabetes, CAD, and ischemic cardiomyopathy presented with DKA.  She was noted to have elevated troponin.  1. Elevated  troponin: She has a known chronically occluded mid LAD.  Possible demand ischemia with dehydration and perhaps some hypotension.  No chest pain or dyspnea.  Similar episode in 2012 with DKA but troponin higher at that time.  LHC was done with no new CAD.  This admission, TnI peaked at 2.  Echo shows EF 25-30% which is lower than in the past but wall motion abnormality pattern is the same.  - ASA, statin - Can increase Coreg back to home dose 6.25 mg bid.  - Would not do cardiac cath at this point with no symptoms, mild troponin elevation, and AKI.   2. Ischemic cardiomyopathy: EF 25-30% on echo yesterday which is lower than in the past, but wall motion abnormality pattern is the same.  She appears euvolemic.  She has had a hard time tolerating cardiac meds.  She has not been on ACEI or spironolactone due to hyperkalemia and hypotension.  Back on Coreg.  Digoxin level ok.   - Increase Coreg back to 6.25 mg bid.  - Can restart lower dose of Lasix at home, 20 mg daily.  - Would continue digoxin 0.125 mg 1/2 tab every other day.  - At followup, will likely try to carefully get her back on low dose ACEI.  - Will attempt to advance medical management (though not sure she will tolerate much in the way of higher med doses) and repeat echo in 3 months.  If EF remains low, will need ICD.  3. AKI: Creatinine up from baseline, likely due to DKA with dehydration.  Improving with gentle hydration. 4. Diabetes: Per primary service.   Marca Ancona 03/26/2013 9:07 AM

## 2013-03-31 ENCOUNTER — Telehealth: Payer: Self-pay | Admitting: Cardiology

## 2013-03-31 NOTE — Telephone Encounter (Signed)
New Message  Pt called requests a call back to discuss if a follow Up aqppointment is needed dbeing that she was seen in the hospital.. (che

## 2013-03-31 NOTE — Telephone Encounter (Signed)
Missed her recent Dr. Shirlee Latch appointment due to being hospitalized.  Discharged 03/26/13. Has f/up appointment tomorrow with PCP Scheduled appointment with Dr. Shirlee Latch for 05/08/2013, 10:30am. Offered earlier appointment with physician extender, pt wishes to wait for opening with Dr. Shirlee Latch.

## 2013-03-31 NOTE — Telephone Encounter (Signed)
New message   Pt called requests a call back to discuss if a follow Up appointment is needed being that she was seen in the hospital..Please call (

## 2013-04-06 NOTE — Telephone Encounter (Signed)
Pt advised appt re-scheduled for 04/20/13 12:30PM with Dr Shirlee Latch.

## 2013-04-20 ENCOUNTER — Ambulatory Visit (INDEPENDENT_AMBULATORY_CARE_PROVIDER_SITE_OTHER): Payer: Medicare Other | Admitting: Cardiology

## 2013-04-20 ENCOUNTER — Other Ambulatory Visit: Payer: Medicare Other

## 2013-04-20 ENCOUNTER — Ambulatory Visit: Payer: Medicare Other | Admitting: Cardiology

## 2013-04-20 ENCOUNTER — Encounter: Payer: Self-pay | Admitting: Cardiology

## 2013-04-20 VITALS — BP 102/60 | HR 80 | Ht 64.0 in | Wt 163.0 lb

## 2013-04-20 DIAGNOSIS — I639 Cerebral infarction, unspecified: Secondary | ICD-10-CM

## 2013-04-20 DIAGNOSIS — I2510 Atherosclerotic heart disease of native coronary artery without angina pectoris: Secondary | ICD-10-CM

## 2013-04-20 DIAGNOSIS — I5022 Chronic systolic (congestive) heart failure: Secondary | ICD-10-CM

## 2013-04-20 DIAGNOSIS — I6350 Cerebral infarction due to unspecified occlusion or stenosis of unspecified cerebral artery: Secondary | ICD-10-CM

## 2013-04-20 DIAGNOSIS — E785 Hyperlipidemia, unspecified: Secondary | ICD-10-CM

## 2013-04-20 LAB — BASIC METABOLIC PANEL
BUN: 16 mg/dL (ref 6–23)
CO2: 30 mEq/L (ref 19–32)
Calcium: 9.2 mg/dL (ref 8.4–10.5)
GFR: 54.28 mL/min — ABNORMAL LOW (ref >60.00)
Glucose, Bld: 187 mg/dL — ABNORMAL HIGH (ref 70–99)
Potassium: 4.3 mEq/L (ref 3.5–5.1)
Sodium: 143 mEq/L (ref 135–145)

## 2013-04-20 NOTE — Patient Instructions (Addendum)
Your physician recommends that you continue on your current medications as directed. Please refer to the Current Medication list given to you today.  Lab today bmet  Your physician has requested that you have an echocardiogram. Echocardiography is a painless test that uses sound waves to create images of your heart. It provides your doctor with information about the size and shape of your heart and how well your heart's chambers and valves are working. This procedure takes approximately one hour. There are no restrictions for this procedure. To be scheduled March 2015  Your physician recommends that you schedule a follow-up appointment in: March 2015 after echo, in the CHF clinic

## 2013-04-21 ENCOUNTER — Other Ambulatory Visit: Payer: Self-pay

## 2013-04-21 MED ORDER — FUROSEMIDE 40 MG PO TABS: 40.0000 mg | ORAL_TABLET | Freq: Every day | ORAL | Status: DC

## 2013-04-21 NOTE — Progress Notes (Signed)
Patient ID: BARB SHEAR, female   DOB: February 11, 1961, 52 y.o.   MRN: [id number] PCP: Dr. Evlyn Kanner  52 yo with history of Type I diabetes, CAD, and ischemic cardiomyopathy presents for cardiology followup.  She had a prolonged admission with DKA and systolic CHF in 12/11.  Cardiac enzymes were found to be elevated and left heart cath showed occluded LAD, which appeared chronic at the time of cath.  Echo showed peri-apical akinesis with EF 30%.  Management was complicated by orthostatic hypotension causing syncope.  This was likely due to combination of low cardiac output and diabetic autonomic neuropathy.  This limited titration of cardiac medications initially. Repeat echo (1/12) showed EF 40-45% (improved).  She was readmitted with DKA in 3/12, and cardiac enzymes were noted to be quite elevated with troponin peaking at 24 and CKMB at 69.  Repeat LHC was done, actually showing no significant difference from the 12/11 study.    In 5/12, she noted weakness one day in her right leg and arm.  She had a head MRI done, showing a small acute left parietal infarction.  This stroke occurred while on ASA and Plavix.  She has improved but her left arm still has some weakness. Carotid dopplers showed no significant carotid stenosis.  I did a limited echo to look for LV thrombus, and this was not seen.  3 week event monitor showed no atrial fibrillation.    Echo in 5/14 showed EF 40%.  We had to cut back on her cardiac meds due to hyperkalemia and hypotension.  She is now off ACEI completely (was hypotensive with even a low dose).  She was admitted again in 12/14 with DKA and elevated troponin (peak 2).  No chest pain.  Echo in 12/4 showed EF down to 25-30% but wall motion abnormality pattern was the same.  Given elevated creatinine, no chest pain, and very elevated troponin at prior admit with DKA without new coronary disease, I did not cath her.    Currently, she is home again.  She says that her blood glucose is doing better.   No chest pain.  She uses her walker for stability but can walk around in her house and go to church without exertional dyspnea.  No orthopnea or PND.  No falls or lightheadedness.  Weight is down 4 lbs.   Labs (12/11): K 3.7, creatinine 0.88, BNP 612, LDL 29, HDL 44 Labs (1/12): digoxin 1.0, BNP 199, K 4.4, creatinine 0.9 Labs (5/12): digoxin 0.4 Labs (6/12): K 4.2, creatinine 0.99 Labs (7/12): K 6.2, creatinine 1.3 (KCl and spironolactone held).  Labs (7/12), repeat: K 4.8, creatinine 1.2, BNP 173 Labs (11/12): K 4.6, creatinine 1.6 => 1.3, TnI 0.7 Labs (12/12): K 4.9, creatinine 1.5, LDL 76, HDL 55, BNP 103, digoxin 1.0 Labs (3/13): K 4.2, creatinine 1.1, digoxin 1.0 Labs (6/13): LDL 57, HDL 51, digoxin 0.3 Labs (21/30): K 4.4, creatinine 1.4 Labs (1/14): BNP 253 => 108, K 4.4, creatinine 1.3 Labs (4/14): K 3.7, creatinine 1.2, LDL 61, HDL 57 Labs (12/14): K 4.2, creatinine 1.79, digoxin 0.7  Allergies (verified):  1)  Reglan 2)  Erythromycin  Past Medical History: 1. Hypertension: However, after MI had orthostatic hypotension.  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 CAD: NSTEMI 12/11.  Cardiac catheterization (12/11) showing LAD totalled in the midportion, diagonal 1 70% stenosed (small 2-mm vessel), circumflex nonobstructive, OM1 40%, OM2 30%, RCA small and no significant disease.  LAD occlusion was calcified and appeared  chronic at the time of catheterization so no intervention.  Patient readmitted with DKA in 3/12, troponin found to be 24 and CKMB 69.  Repeat LHC showed no change from 12/11 study.  5. Ischemic cardiomyopathy: Echo (12/11) with EF 30% and periapical akinesis, no significant MR, RV looked normal.  QRS is not wide on ECG.  Echo (1/12): EF 40-45%, severe hypokinesis of the anteroseptal wall, apical inferior wall, inferoseptal wall, and apex.  Hyperkalemia with spironolactone and with > 2.5 mg daily enalapril.  Echo 7/12 with EF 40-45%.  Echo (5/14): EF 40%,  mid-apical anteroseptal hypokinesis, grade II diastolic dysfunction. Echo (12/14) with EF 25-30%, periapical akinesis.  6. Orthostatic hypotension: Syncopal episode in 12/11 likely due to this.  Probably from combination of depressed cardiac output and diabetic autonomic neuropathy.  7. Type 1 insulin-dependent diabetes. History of DKA.  8. Hyperlipidemia.  9. Diabetic retinopathy.  10. Gastroparesis.  11. Diabetic nephropathy  12. Depression.  13. 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  Carotid dopplers (7/12) with no significant disease.  Limited echo in 7/12 did not show an LV apical thrombus.  3-week event monitor in 7/12 did not show any runs of atrial fibrillation.   Family History: No FH of Colon Cancer: Family History of Pancreatic Cancer: Father deceased 69 No premature CAD  Social History: Divorced, 1 boy unemployed, lives in [place] Patient has never smoked.  Alcohol Use - no Daily Caffeine Use 1 cup/day Illicit Drug Use - no  ROS: All systems reviewed and negative except as per HPI.    Current Outpatient Prescriptions  Medication Sig Dispense Refill  . ALPRAZolam (XANAX) 0.5 MG tablet Take 0.5 mg by mouth daily as needed for anxiety.       [name] aspirin EC 81 MG tablet Take 81 mg by mouth at bedtime.       [name] CALCIUM PO Take 500 mg by mouth daily.      . carvedilol (COREG) 6.25 MG tablet Take 6.25 mg by mouth 2 (two) times daily.      . citalopram (CELEXA) 20 MG tablet Take 2 tablets by mouth daily.       . clopidogrel (PLAVIX) 75 MG tablet Take 1 tablet (75 mg total) by mouth daily.  30 tablet  6  . digoxin (LANOXIN) 0.125 MG tablet 1/2 every other day  15 tablet  6  . ferrous sulfate 325 (65 FE) MG tablet Take 325 mg by mouth 2 (two) times daily.       . insulin aspart (NOVOLOG) 100 UNIT/ML injection Inject 2-3 Units into the skin 3 (three) times daily before meals. Sliding scale      . Multiple Vitamins-Iron (QC DAILY MULTIVITAMINS/IRON) TABS Take 1 tablet by  mouth daily.       . rosuvastatin (CRESTOR) 40 MG tablet Take 40 mg by mouth daily.   30 tablet  11  . Vitamin D, Ergocalciferol, (DRISDOL) 50000 UNITS CAPS Take 50,000 Units by mouth every 7 (seven) days. Saturday      . furosemide (LASIX) 40 MG tablet Take 1 tablet (40 mg total) by mouth daily.  90 tablet  1  . insulin glargine (LANTUS) 100 UNIT/ML injection Inject into the skin daily. 20 units in the am, 18 units in the pm       No current facility-administered medications for this visit.    BP 102/60  Pulse 80  Ht 5\' 4"  (1.626 m)  Wt 73.936 kg (163 lb)  BMI 27.97 kg/m2  LMP  03/13/2011 General:  Well developed, well nourished, in no acute distress. Neck:  Neck thick, JVP difficult. No masses, thyromegaly or abnormal cervical nodes. Lungs:  Crackles right base Heart:  Non-displaced PMI, chest non-tender; regular rate and rhythm, S1, S2 without murmurs, rubs or gallops. Carotid upstroke normal, no bruit. Pedals normal pulses. No edema.   Abdomen:  Bowel sounds positive; abdomen soft and non-tender without masses, organomegaly, or hernias noted. No hepatosplenomegaly. Extremities:  No clubbing or cyanosis. Neurologic:  Alert and oriented x 3. Right arm is weak.  Psych:  Normal affect.  Assessment/Plan:  CHRONIC SYSTOLIC HEART FAILURE  EF 25-30% on 12/14 echo.  This is lower than the most recent prior, but was in the setting of acute DKA which may have affected her LV function.  The wall motion abnormality pattern was similar to past echoes.  She is off spironolactone as she developed hyperkalemia while taking this medication and enalapril was stopped due to low BP. I will continue her on the current doses of Coreg, digoxin, and Lasix.  I do not think that she will tolerate up-titration of her meds.  She looks euvolemic on current Lasix.  Digoxin level was ok in the hospital.   - Check BMET today. - Will repeat echo in 3/15.  Hopefully, EF will look better when she is not acutely ill.  If  not, will need to talk to her about an ICD.  CAD Known coronary disease with occluded mid-LAD.  She had troponin peaking at 2 during recent admission with DKA.  She did not have chest pain and wall motion abnormality pattern on echo was similar to the past.  Creatinine was elevated.  She had a similar DKA event in 3/12 with very elevated TnI and catheterization showing no changes in her coronary disease.   I elected not to re-cath her.  She denies any chest pain or dyspnea currently. Continue ASA 81, Plavix, and Crestor.  CVA  Occurred while on aspirin and Plavix. Some residual right arm weakness. Carotid dopplers showed no significant disease and limited echo to look for LV apical thrombus was unremarkable. 3-week monitor did not show atrial fibrillation.  HYPERLIPIDEMIA  Continue Crestor.  Good lipids in 4/14.    Marca Ancona 04/21/2013 5:42 PM

## 2013-04-22 ENCOUNTER — Telehealth: Payer: Self-pay | Admitting: Cardiology

## 2013-04-22 MED ORDER — FUROSEMIDE 20 MG PO TABS: 20.0000 mg | ORAL_TABLET | Freq: Every day | ORAL | Status: DC

## 2013-04-22 NOTE — Telephone Encounter (Signed)
Pt's mother called having questions regarding lasix dose. On her last office visit pt's instructions states that pt is to take 40 mg of Lasix , and  pt  is taking only 20 mg daily. After reviewing pt's records, and after speaking with pt's mother, it was clear that it was an  error because mother  gave the wrong dose to the CMA transcribing the medication prior pt  seen by the cardiologist . It is verified that pt is taking 20 mg lasix. Pt's mother aware.

## 2013-04-22 NOTE — Telephone Encounter (Signed)
New message   Has questions about her lasix furosemide

## 2013-04-29 ENCOUNTER — Telehealth: Payer: Self-pay | Admitting: Cardiology

## 2013-04-29 NOTE — Telephone Encounter (Signed)
New Message  2nd call  Pt's mother called requesting a call back from the nurse about the pt's Medication. Pt called earlier for refills// transferred to refills department. Pt's mother decline further details//SR

## 2013-04-29 NOTE — Telephone Encounter (Signed)
Spoke with patient's mother and clarified with Dr Shirlee LatchMcLean pt should be on Plavix 75mg  daily not two times daily.

## 2013-05-08 ENCOUNTER — Ambulatory Visit: Payer: Medicare Other | Admitting: Cardiology

## 2013-05-11 ENCOUNTER — Ambulatory Visit (INDEPENDENT_AMBULATORY_CARE_PROVIDER_SITE_OTHER): Payer: Medicare Other | Admitting: Ophthalmology

## 2013-05-11 DIAGNOSIS — E1165 Type 2 diabetes mellitus with hyperglycemia: Secondary | ICD-10-CM

## 2013-05-11 DIAGNOSIS — H2510 Age-related nuclear cataract, unspecified eye: Secondary | ICD-10-CM

## 2013-05-11 DIAGNOSIS — E1139 Type 2 diabetes mellitus with other diabetic ophthalmic complication: Secondary | ICD-10-CM

## 2013-05-11 DIAGNOSIS — H43819 Vitreous degeneration, unspecified eye: Secondary | ICD-10-CM

## 2013-05-11 DIAGNOSIS — E11359 Type 2 diabetes mellitus with proliferative diabetic retinopathy without macular edema: Secondary | ICD-10-CM

## 2013-05-26 ENCOUNTER — Emergency Department (HOSPITAL_COMMUNITY)
Admission: EM | Admit: 2013-05-26 | Discharge: 2013-05-26 | Disposition: A | Discharge status: Home / Self Care | Payer: Medicare Other | Attending: Emergency Medicine | Admitting: Emergency Medicine

## 2013-05-26 ENCOUNTER — Emergency Department (HOSPITAL_COMMUNITY): Payer: Medicare Other

## 2013-05-26 ENCOUNTER — Encounter (HOSPITAL_COMMUNITY): Payer: Self-pay | Admitting: Emergency Medicine

## 2013-05-26 DIAGNOSIS — Z8739 Personal history of other diseases of the musculoskeletal system and connective tissue: Secondary | ICD-10-CM | POA: Insufficient documentation

## 2013-05-26 DIAGNOSIS — E559 Vitamin D deficiency, unspecified: Secondary | ICD-10-CM | POA: Insufficient documentation

## 2013-05-26 DIAGNOSIS — Z79899 Other long term (current) drug therapy: Secondary | ICD-10-CM | POA: Insufficient documentation

## 2013-05-26 DIAGNOSIS — I69959 Hemiplegia and hemiparesis following unspecified cerebrovascular disease affecting unspecified side: Secondary | ICD-10-CM | POA: Insufficient documentation

## 2013-05-26 DIAGNOSIS — Y9389 Activity, other specified: Secondary | ICD-10-CM | POA: Insufficient documentation

## 2013-05-26 DIAGNOSIS — E11319 Type 2 diabetes mellitus with unspecified diabetic retinopathy without macular edema: Secondary | ICD-10-CM | POA: Insufficient documentation

## 2013-05-26 DIAGNOSIS — E162 Hypoglycemia, unspecified: Secondary | ICD-10-CM

## 2013-05-26 DIAGNOSIS — E1049 Type 1 diabetes mellitus with other diabetic neurological complication: Secondary | ICD-10-CM | POA: Insufficient documentation

## 2013-05-26 DIAGNOSIS — D509 Iron deficiency anemia, unspecified: Secondary | ICD-10-CM | POA: Insufficient documentation

## 2013-05-26 DIAGNOSIS — E1142 Type 2 diabetes mellitus with diabetic polyneuropathy: Secondary | ICD-10-CM | POA: Insufficient documentation

## 2013-05-26 DIAGNOSIS — Z7982 Long term (current) use of aspirin: Secondary | ICD-10-CM | POA: Insufficient documentation

## 2013-05-26 DIAGNOSIS — F329 Major depressive disorder, single episode, unspecified: Secondary | ICD-10-CM | POA: Insufficient documentation

## 2013-05-26 DIAGNOSIS — W06XXXA Fall from bed, initial encounter: Secondary | ICD-10-CM | POA: Insufficient documentation

## 2013-05-26 DIAGNOSIS — I10 Essential (primary) hypertension: Secondary | ICD-10-CM | POA: Insufficient documentation

## 2013-05-26 DIAGNOSIS — Z7902 Long term (current) use of antithrombotics/antiplatelets: Secondary | ICD-10-CM | POA: Insufficient documentation

## 2013-05-26 DIAGNOSIS — Y929 Unspecified place or not applicable: Secondary | ICD-10-CM | POA: Insufficient documentation

## 2013-05-26 DIAGNOSIS — E1039 Type 1 diabetes mellitus with other diabetic ophthalmic complication: Secondary | ICD-10-CM | POA: Insufficient documentation

## 2013-05-26 DIAGNOSIS — I2510 Atherosclerotic heart disease of native coronary artery without angina pectoris: Secondary | ICD-10-CM | POA: Insufficient documentation

## 2013-05-26 DIAGNOSIS — I5020 Unspecified systolic (congestive) heart failure: Secondary | ICD-10-CM | POA: Insufficient documentation

## 2013-05-26 DIAGNOSIS — Z794 Long term (current) use of insulin: Secondary | ICD-10-CM | POA: Insufficient documentation

## 2013-05-26 DIAGNOSIS — Z8719 Personal history of other diseases of the digestive system: Secondary | ICD-10-CM | POA: Insufficient documentation

## 2013-05-26 DIAGNOSIS — E1069 Type 1 diabetes mellitus with other specified complication: Secondary | ICD-10-CM | POA: Insufficient documentation

## 2013-05-26 DIAGNOSIS — E785 Hyperlipidemia, unspecified: Secondary | ICD-10-CM | POA: Insufficient documentation

## 2013-05-26 DIAGNOSIS — F3289 Other specified depressive episodes: Secondary | ICD-10-CM | POA: Insufficient documentation

## 2013-05-26 DIAGNOSIS — I252 Old myocardial infarction: Secondary | ICD-10-CM | POA: Insufficient documentation

## 2013-05-26 DIAGNOSIS — Z8614 Personal history of Methicillin resistant Staphylococcus aureus infection: Secondary | ICD-10-CM | POA: Insufficient documentation

## 2013-05-26 LAB — COMPREHENSIVE METABOLIC PANEL
ALK PHOS: 63 U/L (ref 39–117)
ALT: 14 U/L (ref 0–35)
AST: 17 U/L (ref 0–37)
Albumin: 3.5 g/dL (ref 3.5–5.2)
BILIRUBIN TOTAL: 0.4 mg/dL (ref 0.3–1.2)
BUN: 20 mg/dL (ref 6–23)
CHLORIDE: 104 meq/L (ref 96–112)
CO2: 24 meq/L (ref 19–32)
Calcium: 9 mg/dL (ref 8.4–10.5)
Creatinine, Ser: 0.97 mg/dL (ref 0.50–1.10)
GFR, EST AFRICAN AMERICAN: 75 mL/min — AB (ref >90)
GFR, EST NON AFRICAN AMERICAN: 65 mL/min — AB (ref >90)
GLUCOSE: 247 mg/dL — AB (ref 70–99)
POTASSIUM: 4 meq/L (ref 3.7–5.3)
SODIUM: 142 meq/L (ref 137–147)
Total Protein: 7.4 g/dL (ref 6.0–8.3)

## 2013-05-26 LAB — CBC WITH DIFFERENTIAL/PLATELET
Basophils Absolute: 0 10*3/uL (ref 0.0–0.1)
Basophils Relative: 0 % (ref 0–1)
Eosinophils Absolute: 0.1 10*3/uL (ref 0.0–0.7)
Eosinophils Relative: 1 % (ref 0–5)
HCT: 42.3 % (ref 36.0–46.0)
HEMOGLOBIN: 14.5 g/dL (ref 12.0–15.0)
LYMPHS PCT: 15 % (ref 12–46)
Lymphs Abs: 1.9 10*3/uL (ref 0.7–4.0)
MCH: 31.6 pg (ref 26.0–34.0)
MCHC: 34.3 g/dL (ref 30.0–36.0)
MCV: 92.2 fL (ref 78.0–100.0)
MONOS PCT: 3 % (ref 3–12)
Monocytes Absolute: 0.4 10*3/uL (ref 0.1–1.0)
NEUTROS PCT: 82 % — AB (ref 43–77)
Neutro Abs: 10.4 10*3/uL — ABNORMAL HIGH (ref 1.7–7.7)
PLATELETS: 224 10*3/uL (ref 150–400)
RBC: 4.59 MIL/uL (ref 3.87–5.11)
RDW: 13.1 % (ref 11.5–15.5)
WBC: 12.8 10*3/uL — ABNORMAL HIGH (ref 4.0–10.5)

## 2013-05-26 LAB — URINE MICROSCOPIC-ADD ON

## 2013-05-26 LAB — GLUCOSE, CAPILLARY
Glucose-Capillary: 182 mg/dL — ABNORMAL HIGH (ref 70–99)
Glucose-Capillary: 276 mg/dL — ABNORMAL HIGH (ref 70–99)
Glucose-Capillary: 254 mg/dL — ABNORMAL HIGH (ref 70–99)

## 2013-05-26 LAB — TROPONIN I
Troponin I: <0.3 ng/mL (ref <0.30)
Troponin I: <0.3 ng/mL (ref <0.30)

## 2013-05-26 LAB — URINALYSIS, ROUTINE W REFLEX MICROSCOPIC
BILIRUBIN URINE: NEGATIVE
GLUCOSE, UA: 500 mg/dL — AB
KETONES UR: 15 mg/dL — AB
Nitrite: POSITIVE — AB
PROTEIN: NEGATIVE mg/dL
Specific Gravity, Urine: 1.025 (ref 1.005–1.030)
Urobilinogen, UA: 0.2 mg/dL (ref 0.0–1.0)
pH: 5.5 (ref 5.0–8.0)

## 2013-05-26 LAB — DIGOXIN LEVEL: Digoxin Level: 0.4 ng/mL — ABNORMAL LOW (ref 0.8–2.0)

## 2013-05-26 LAB — PROTIME-INR
INR: 1 (ref 0.00–1.49)
PROTHROMBIN TIME: 13 s (ref 11.6–15.2)

## 2013-05-26 LAB — APTT: aPTT: 31 seconds (ref 24–37)

## 2013-05-26 MED ORDER — SODIUM CHLORIDE 0.9 % IV BOLUS (SEPSIS)
1000.0000 mL | Freq: Once | INTRAVENOUS | Status: AC
  Administered 2013-05-26: 1000 mL via INTRAVENOUS

## 2013-05-26 NOTE — ED Notes (Signed)
Meal tray provided for patient. She reports she ate her sweet potatoes and peaches.

## 2013-05-26 NOTE — Discharge Instructions (Signed)
Please call your doctor for a followup appointment within 24-48 hours. When you talk to your doctor please let them know that you were seen in the emergency department and have them acquire all of your records so that they can discuss the findings with you and formulate a treatment plan to fully care for your new and ongoing problems. Please call and set-up an appointment with primary care provider to be re-assessed within 24 hours - you need to be re-assessed Please closely monitor glucose levels at least 4 times per day Please rest and stay hydrated Please continue to monitor symptoms closely and if symptoms are to worsen or change (fever greater than 101, chills, sweating, nausea, vomiting, diarrhea, stomach pain, headache, confusion, blurred vision, dizziness, numbness, tingling, chest pain, shortness of breath, difficulty breathing) please report back to the ED immediately   Hypoglycemia (Low Blood Sugar) Hypoglycemia is when the glucose (sugar) in your blood is too low. Hypoglycemia can happen for many reasons. It can happen to people with or without diabetes. Hypoglycemia can develop quickly and can be a medical emergency.  CAUSES  Having hypoglycemia does not mean that you will develop diabetes. Different causes include:  Missed or delayed meals or not enough carbohydrates eaten.  Medication overdose. This could be by accident or deliberate. If by accident, your medication may need to be adjusted or changed.  Exercise or increased activity without adjustments in carbohydrates or medications.  A nerve disorder that affects body functions like your heart rate, blood pressure and digestion (autonomic neuropathy).  A condition where the stomach muscles do not function properly (gastroparesis). Therefore, medications may not absorb properly.  The inability to recognize the signs of hypoglycemia (hypoglycemic unawareness).  Absorption of insulin  may be altered.  Alcohol  consumption.  Pregnancy/menstrual cycles/postpartum. This may be due to hormones.  Certain kinds of tumors. This is very rare. SYMPTOMS   Sweating.  Hunger.  Dizziness.  Blurred vision.  Drowsiness.  Weakness.  Headache.  Rapid heart beat.  Shakiness.  Nervousness. DIAGNOSIS  Diagnosis is made by monitoring blood glucose in one or all of the following ways:  Fingerstick blood glucose monitoring.  Laboratory results. TREATMENT  If you think your blood glucose is low:  Check your blood glucose, if possible. If it is less than 70 mg/dl, take one of the following:  3-4 glucose tablets.   cup juice (prefer clear like apple).   cup "regular" soda pop.  1 cup milk.  -1 tube of glucose gel.  5-6 hard candies.  Do not over treat because your blood glucose (sugar) will only go too high.  Wait 15 minutes and recheck your blood glucose. If it is still less than 70 mg/dl (or below your target range), repeat treatment.  Eat a snack if it is more than one hour until your next meal. Sometimes, your blood glucose may go so low that you are unable to treat yourself. You may need someone to help you. You may even pass out or be unable to swallow. This may require you to get an injection of glucagon, which raises the blood glucose. HOME CARE INSTRUCTIONS  Check blood glucose as recommended by your caregiver.  Take medication as prescribed by your caregiver.  Follow your meal plan. Do not skip meals. Eat on time.  If you are going to drink alcohol, drink it only with meals.  Check your blood glucose before driving.  Check your blood glucose before and after exercise. If you exercise longer  or different than usual, be sure to check blood glucose more frequently.  Always carry treatment with you. Glucose tablets are the easiest to carry.  Always wear medical alert jewelry or carry some form of identification that states that you have diabetes. This will alert people  that you have diabetes. If you have hypoglycemia, they will have a better idea on what to do. SEEK MEDICAL CARE IF:   You are having problems keeping your blood sugar at target range.  You are having frequent episodes of hypoglycemia.  You feel you might be having side effects from your medicines.  You have symptoms of an illness that is not improving after 3-4 days.  You notice a change in vision or a new problem with your vision. SEEK IMMEDIATE MEDICAL CARE IF:   You are a family member or friend of a person whose blood glucose goes below 70 mg/dl and is accompanied by:  Confusion.  A change in mental status.  The inability to swallow.  Passing out. Document Released: 04/09/2005 Document Revised: 07/02/2011 Document Reviewed: 08/06/2011 [facility] Patient Information 2014 [place], [place].   Emergency Department Resource Guide 1) Find a Doctor and Pay Out of Pocket Although you won't have to find out who is covered by your insurance plan, it is a good idea to ask around and get recommendations. You will then need to call the office and see if the doctor you have chosen will accept you as a new patient and what types of options they offer for patients who are self-pay. Some doctors offer discounts or will set up payment plans for their patients who do not have insurance, but you will need to ask so you aren't surprised when you get to your appointment.  2) Contact Your Local Health Department Not all health departments have doctors that can see patients for sick visits, but many do, so it is worth a call to see if yours does. If you don't know where your local health department is, you can check in your phone book. The CDC also has a tool to help you locate your state's health department, and many state websites also have listings of all of their local health departments.  3) Find a Walk-in Clinic If your illness is not likely to be very severe or complicated, you may want to try a  walk in clinic. These are popping up all over the country in pharmacies, drugstores, and shopping centers. They're usually staffed by nurse practitioners or physician assistants that have been trained to treat common illnesses and complaints. They're usually fairly quick and inexpensive. However, if you have serious medical issues or chronic medical problems, these are probably not your best option.  No Primary Care Doctor: - Call Health Connect at  [phone] - they can help you locate a primary care doctor that  accepts your insurance, provides certain services, etc. - Physician Referral Service- [phone]  Chronic Pain Problems: Organization         Address  Phone   Notes  [facility]  [phone] Patients need to be referred by their primary care doctor.   Medication Assistance: Organization         Address  Phone   Notes  [facility] Medication [facility] [address] [place] [place], [place] [address] [phone] --Must be a resident of [facility] -- Must have NO insurance coverage whatsoever (no Medicaid/ Medicare, etc.) -- The pt. MUST have a primary care doctor  that directs their care regularly and follows them in the community   Med[facility]ist  [phone]   [employer]  [phone]) [phone]    Agencies that provide inexpensive medical care: Organization         Address  Phone   Notes  [facility]  [phone]   [facility]    [phone]   [facility] [address] [place], [place] [address] [phone]   [facility] [address], [place] [phone]   [facility]anned Parenthood    [phone]) [phone]   [facility]    [phone]) [phone]   [facility] and [facility]  [address], [place] Phone:  [phone], Fax:  [phone]) [phone] Hours of Operation:  9 am - 6 pm, M-F.  Also accepts Medicaid/Medicare and self-pay.  [facility]  [address], [place] Phone: [phone], Fax: [phone]. Hours of Operation:  8:30 am - 5:30 pm, M-F.  Also accepts Medicaid and self-pay.  [facility] [place] [address], [place] Phone: [phone]) [phone]   [facility] [address], [place], [place] [phone], Ext. [phone] Mondays & Thursdays: 7-9 AM.  First 15 patients are seen on a first come, first serve basis.    Medicaid-accepting [facility] Providers:  Organization         Address  Phone   Notes  [facility] [address], [place] [phone] Also accepts self-pay patients.  [facility] [address] [place], [place]  [phone]   [facility] [address], [place] [phone]   [facility] [address] [place] [address], [place] [phone]) [phone]   Renaye RakersVeita Bland [address], [place]   [phone] Only accepts [place] Access [place] patients after they have their name applied to their card.   Self-Pay (no insurance) in [facility]:  Organization         Address  Phone   Notes  Sickle Cell Patients, [facility] [address], [place] [phone]   [facility] [address], [place] [phone]   [facility]  [address], [place] [phone]   [facility]/Dr. Osei-Bonsu  [address] [place] [address], [place] or [phone], [place] [phone] Phone number for both [place] and [place] locations is the same.  [facility] [address], [place] [phone]) [phone]   [facility] [address] [place] [address], [place] or [address] [phone]   [facility] [address], [place] [phone]) [phone], phone; [phone]) [phone], fax Sees  patients 1st and 3rd Saturday of every month.  Must not qualify for public or private insurance (i.e. Medicaid, Medicare, [place] Health Choice, Veterans' Benefits)  Household income should be no more than 200% of the poverty level The clinic cannot treat you if you are pregnant or think you are pregnant  Sexually transmitted diseases are not treated at the clinic.    Dental Care: Organization         Address  Phone  Notes  [facility] [address], [place] [phone] Accepts children  up to age 27 who are enrolled in Medicaid or [place] Health Choice; pregnant women with a Medicaid card; and children who have applied for Medicaid or [place] Health Choice, but were declined, whose parents can pay a reduced fee at time of service.  [facility]  [address], [place] [phone] Accepts children up to age 75 who are enrolled in [place] or [place] Health Choice; pregnant women with a Medicaid card; and children who have applied for Medicaid or [place] Health Choice, but were declined, whose parents can pay a reduced fee at time of service.  [facility]  [address] [place], [place] [phone] Patients are seen by appointment only. Walk-ins are not accepted. [facility] will see patients 91 years of age and older. Monday - Tuesday (8am-5pm) Most Wednesdays (8:30-5pm) $30 per visit, cash only  [facility] Adult Dental Access PROGRAM  [address], [facility] [phone] Patients are seen by appointment only. Walk-ins are not accepted. [facility] will see patients 53 years of age and older. One Wednesday Evening (Monthly: Volunteer Based).  $30 per visit, cash only  [employer]  [phone] for adults; Children under age 45, call Graduate Pediatric Dentistry at [phone]. Children aged 99-14, please call [phone] to request a  pediatric application.  Dental services are provided in all areas of dental care including fillings, crowns and bridges, complete and partial dentures, implants, gum treatment, root canals, and extractions. Preventive care is also provided. Treatment is provided to both adults and children. Patients are selected via a lottery and there is often a waiting list.   [facility] [address] [place]  [phone] [email or web address]   [facility] [address] [place], [place] [phone], Ext. [phone] Second and Fourth Thursday of each month, opens at 6:30 AM; Clinic ends at 9 AM.  Patients are seen on a first-come first-served basis, and a limited number are seen during each clinic.   [facility]  [address] [place], [place] [phone]   Eligibility Requirements You must have lived in [place], [place], or [place] counties for at least the last three months.   You cannot be eligible for state or federal sponsored [place], including [employer], [place], or [employer].   You generally cannot be eligible for healthcare insurance through your employer.    How to apply: Eligibility screenings are held every Tuesday and Wednesday afternoon from 1:00 pm until 4:00 pm. You do not need an appointment for the interview!  [facility] [address], [place], [place] [phone]   [facility] Health Department  [phone]   [facility] Health Department  [phone]   [facility] Health Department  [phone]    Behavioral Health Resources in the Community: Intensive Outpatient Programs Organization         Address  Phone  Notes  [facility] [address], [place], [place] [phone]   [facility] [address], [place], [place] [phone]   [facility] [address], [place], [place]  [phone]   [facility]  Mental Health [address]  [place], [place] [phone] or [phone]   Substance Abuse Resources Organization         Address  Phone  Notes  Al[facility] Drug Services  [phone]   Addiction  [facility]  [phone]   The [place]  3181025031   Floydene Flock  [phone]   Residential & Outpatient Substance Abuse Program  [phone]   Psychological Services Organization         Address  Phone  Notes  [facility] Behavioral Health  [phone]   [facility]  [phone]   [facility] Mental Health [address] [place] [phone] or [phone]    Mobile Crisis Teams Organization         Address  Phone  Notes  [facility], Mobile Crisis Care Unit  [phone]   [facility]  [address] [place], [place] [phone]   Doristine Locks [address] [place] [phone]    Self-Help/Support Groups Organization         Address  Phone             Notes  [facility] [place] - variety of support groups  336- I7437963 Call for more information  Narcotics Anonymous (NA), [facility] [address], [employer] [place]  2 meetings at this location   Statistician         Address  Phone  Notes  [facility] [address],    [place]  [phone]   [facility]  [address], [place] [address], [place], [place] [phone]   [facility] [address] [place], [place] [phone] Admissions: 8am-3pm M-F  [facility] [address]    [place], [place] [phone]   [facility] [address] [place], [place], [place] [phone]   [facility] [address]  [place], [place] [phone]   [facility] - Intensive Outpatient [address], [place], [place] [phone]   [facility] [address] [place].,   [place], [place] [phone] or [phone]   [facility] ([facility]) [address], [place], [place] [phone] Accepts Medicaid  [facility] [address]  [place] [phone] Substance Abuse/Addiction Treatment   [facility] Organization         Address  Phone  Notes  [facility]  [phone]   Angie Fava, PhD [address] [place], [place]   [phone]) [phone] or [phone]   [facility] Behavioral   [address] [place], [place] [phone]   [facility] [address] [place], [place] [phone] Insurance/Medicaid/sponsorship through [facility] [address]                                    [place], [place] [phone] Therapy/tele-psych/case  [facility] [address] [place], [place] [phone]    Dr. Lolly Mustache  [phone]   [facility]  [employer] [facility] Dept. 1) [address] [place] 2) [address] [place] 3)  [address], [place] [phone]  [phone]   [facility] Child Abuse Hotline 307-694-1818 or [phone] (After Hours)

## 2013-05-26 NOTE — ED Notes (Signed)
Bagged lunch given to pt.s mom along with a coke

## 2013-05-26 NOTE — ED Notes (Signed)
Patient cbg was 254.

## 2013-05-26 NOTE — ED Notes (Signed)
Pt. Updated with plan of care.

## 2013-05-26 NOTE — ED Provider Notes (Signed)
CSN: [id number]     Arrival date & time 05/26/13  [id number] History   First MD Initiated Contact with Patient 05/26/13 [phone]     Chief Complaint  Patient presents with  . Fall   (Consider location/radiation/quality/duration/timing/severity/associated sxs/prior Treatment) The history is provided by the patient and a parent.  [name] is a 53 y/o F with PMHx of HTN, HLD, DM, CHF, stroke in May 2012 that left the patient with right sided hemiplagia, CAD with NSTEMI in 03/2010 with cardiac cath performed resulting in LAD totalled in the midportion - 70% stenosis, LCx nonobstruction, OM1 40%, OM2 30%, RCA no obstructin noted, NSTEMI in 06/2010 cardiac cath performed with same findings, brought in by EMS after episode of hypoglycemia. Patient reported that she was getting out of bed and fell. Mother who lives with patient, reported that she heard the patient yell and heard a loud thump on the floor. Mother reported that she found the patient on the floor and when she went to assist her daughter noted that the patient was easily agitated and would not communicate. Mother reported that she took the patient's CBG which was 35. Patient reported that she knew that her sugar was low. Reported that she takes her medication as prescribed. Mother reported that this has happened to the patient in the past. Patient received glucagon 1 mg IM while en route to the ED due to having difficulty in getting a line in the patient. Patient reported that she is feeling better. Denied chest pain, shortness of breath, difficulty breathing, had injury, loss of consciousness, headache, dizziness, blurred vision, sudden loss of vision, abdominal pain, nausea, vomiting, diarrhea, weakness, numbness, tingling, back pain, neck pain. PCP Dr. Evlyn [facility]  Past Medical History  Diagnosis Date  . Hypertension   . Vitamin D deficiency   . Coronary artery disease     a. (12/11) NSTEMI, in the setting of DKA, s/p Cath--LAD totalled in the midportion,  diag 70% sten (small 2-mm ), LCx nonobst, OM1 40%, OM2 30%, RCA- no dz. chronic calcified LAD --> no intervention.  b. (06/2010) NSTEMI s/p cath showing same findings   . Ischemic cardiomyopathy   . Orthostatic hypotension     a. sycopal episode in 12/11   . Diabetes mellitus     a. Insulin dependant Type I   . Hyperlipidemia   . Diabetic nephropathy   . Diabetic retinopathy   . Gastroparesis   . Depression   . Systolic heart failure     a. (08/2012) EF: 40% Mod hypokin in mid-dist-ant-sept myocard--> psuedonml L vent fill pattern, Grd 2 diast dyscfxn  . Iron deficiency anemia   . Hx MRSA infection   . Osteopenia   . Esophagitis   . NSTEMI (non-ST elevated myocardial infarction)     a. (03/2010) NSTEMI during admission for DKA  b.  . Stroke     a. (2012) residual R hand/arm weakness  b. while on ASA and plavix  . Diabetic neuropathy    Past Surgical History  Procedure Laterality Date  . Appendectomy  1980  . Tubal ligation  1987  . Cesarean section  1984  . Refractive surgery      x3   Family History  Problem Relation Age of Onset  . Pancreatic cancer Father   . Colon cancer Neg Hx   . Diabetes Maternal Grandfather    History  Substance Use Topics  . Smoking status: Never Smoker   . Smokeless tobacco: Never Used  .  Alcohol Use: No   OB History   Grav Para Term Preterm Abortions TAB SAB Ect Mult Living                 Review of Systems  Constitutional: Negative for fever and chills.  Respiratory: Negative for chest tightness and shortness of breath.   Cardiovascular: Negative for chest pain.  Gastrointestinal: Negative for nausea, vomiting, abdominal pain, diarrhea and constipation.  Musculoskeletal: Negative for back pain and neck pain.  Neurological: Negative for dizziness, weakness, numbness and headaches.  All other systems reviewed and are negative.    Allergies  Erythromycin; Food; and Metoclopramide hcl  Home Medications   Current Outpatient Rx   Name  Route  Sig  Dispense  Refill  . ALPRAZolam (XANAX) 0.5 MG tablet   Oral   Take 0.5 mg by mouth every morning.          [name] aspirin EC 81 MG tablet   Oral   Take 81 mg by mouth at bedtime.          . Calcium-Magnesium-Vitamin D (CALCIUM 500 PO)   Oral   Take 500 mg by mouth daily with lunch.         . carvedilol (COREG) 6.25 MG tablet   Oral   Take 6.25 mg by mouth 2 (two) times daily.         . citalopram (CELEXA) 20 MG tablet   Oral   Take 20 mg by mouth daily.          . clopidogrel (PLAVIX) 75 MG tablet   Oral   Take 1 tablet (75 mg total) by mouth daily.   30 tablet   6   . digoxin (LANOXIN) 0.125 MG tablet   Oral   Take 0.0625 mg by mouth every other day.         . ferrous sulfate 325 (65 FE) MG tablet   Oral   Take 650 mg by mouth daily with supper.          . furosemide (LASIX) 20 MG tablet   Oral   Take 1 tablet (20 mg total) by mouth daily.   30 tablet   8   . insulin aspart (NOVOLOG) 100 UNIT/ML injection   Subcutaneous   Inject 2-3 Units into the skin 3 (three) times daily before meals. Sliding scale         . insulin glargine (LANTUS) 100 UNIT/ML injection   Subcutaneous   Inject 18-20 Units into the skin 2 (two) times daily. 20u in the am.  18u in the pm.         . Multiple Vitamin (MULTIVITAMIN WITH MINERALS) TABS tablet   Oral   Take 1 tablet by mouth daily with lunch.         . rosuvastatin (CRESTOR) 40 MG tablet   Oral   Take 40 mg by mouth daily with supper.    30 tablet   11   . Vitamin D, Ergocalciferol, (DRISDOL) 50000 UNITS CAPS   Oral   Take 50,000 Units by mouth every 7 (seven) days. Saturday          BP 150/61  Pulse 83  Temp(Src) 98.2 F (36.8 C) (Oral)  Resp 16  SpO2 96%  LMP 03/13/2011 Physical Exam  Nursing note and vitals reviewed. Constitutional: She is oriented to person, place, and time. She appears well-developed and well-nourished. No distress.  HENT:  Head: Normocephalic and  atraumatic.  Mouth/Throat: No oropharyngeal exudate.  Negative facial trauma noted Negative hematomas identified or palpated  Dry mucus membranes  Eyes: Conjunctivae and EOM are normal. Pupils are equal, round, and reactive to light. Right eye exhibits no discharge. Left eye exhibits no discharge.  Negative nystagmus  Visual field grossly intact   Neck: Normal range of motion. Neck supple. No tracheal deviation present.  Pulmonary/Chest: Effort normal and breath sounds normal. No respiratory distress. She has no wheezes. She has no rales. She exhibits no tenderness.  Abdominal: Soft. Bowel sounds are normal. There is no tenderness. There is no guarding.  Musculoskeletal: Normal range of motion. She exhibits no tenderness.  Full ROM to upper and lower extremities without difficulty noted, negative ataxia noted.  Lymphadenopathy:    She has no cervical adenopathy.  Neurological: She is alert and oriented to person, place, and time. No cranial nerve deficit. She exhibits normal muscle tone. Coordination normal.  Cranial nerves III-XII grossly intact Strength 5+/5+ to left upper and bilateral lower extremities bilaterally with resistance applied, equal distribution noted Mild decrease weakness localized to the right upper extremity secondary to stroke that occurred in May of 2012-chronic issue Patient follows commands well Negative slurred speech Negative facial drooping Patient knows who she is, where she is, year  Skin: Skin is warm and dry. No rash noted. She is not diaphoretic. No erythema.  Psychiatric: She has a normal mood and affect. Her behavior is normal. Thought content normal.    ED Course  Procedures (including critical care time)  2:33 PM This provider re-assessed the patient. Patient appears to be a lot better. Reported that she is feeling much better. Patient more alert and interactive. Patient sitting up in chair without difficulty or distress.   Results for orders  placed during the hospital encounter of 05/26/13  GLUCOSE, CAPILLARY      Result Value Range   Glucose-Capillary 182 (*) 70 - 99 mg/dL  CBC WITH DIFFERENTIAL      Result Value Range   WBC 12.8 (*) 4.0 - 10.5 K/uL   RBC 4.59  3.87 - 5.11 MIL/uL   Hemoglobin 14.5  12.0 - 15.0 g/dL   HCT 16.1  09.6 - 04.5 %   MCV 92.2  78.0 - 100.0 fL   MCH 31.6  26.0 - 34.0 pg   MCHC 34.3  30.0 - 36.0 g/dL   RDW 40.9  81.1 - 91.4 %   Platelets 224  150 - 400 K/uL   Neutrophils Relative % 82 (*) 43 - 77 %   Neutro Abs 10.4 (*) 1.7 - 7.7 K/uL   Lymphocytes Relative 15  12 - 46 %   Lymphs Abs 1.9  0.7 - 4.0 K/uL   Monocytes Relative 3  3 - 12 %   Monocytes Absolute 0.4  0.1 - 1.0 K/uL   Eosinophils Relative 1  0 - 5 %   Eosinophils Absolute 0.1  0.0 - 0.7 K/uL   Basophils Relative 0  0 - 1 %   Basophils Absolute 0.0  0.0 - 0.1 K/uL  COMPREHENSIVE METABOLIC PANEL      Result Value Range   Sodium 142  137 - 147 mEq/L   Potassium 4.0  3.7 - 5.3 mEq/L   Chloride 104  96 - 112 mEq/L   CO2 24  19 - 32 mEq/L   Glucose, Bld 247 (*) 70 - 99 mg/dL   BUN 20  6 - 23 mg/dL   Creatinine, Ser 7.82  0.50 - 1.10 mg/dL   Calcium  9.0  8.4 - 10.5 mg/dL   Total Protein 7.4  6.0 - 8.3 g/dL   Albumin 3.5  3.5 - 5.2 g/dL   AST 17  0 - 37 U/L   ALT 14  0 - 35 U/L   Alkaline Phosphatase 63  39 - 117 U/L   Total Bilirubin 0.4  0.3 - 1.2 mg/dL   GFR calc non Af Amer 65 (*) >90 mL/min   GFR calc Af Amer 75 (*) >90 mL/min  PROTIME-INR      Result Value Range   Prothrombin Time 13.0  11.6 - 15.2 seconds   INR 1.00  0.00 - 1.49  APTT      Result Value Range   aPTT 31  24 - 37 seconds  TROPONIN I      Result Value Range   Troponin I <0.30  <0.30 ng/mL  DIGOXIN LEVEL      Result Value Range   Digoxin Level 0.4 (*) 0.8 - 2.0 ng/mL  TROPONIN I      Result Value Range   Troponin I <0.30  <0.30 ng/mL  GLUCOSE, CAPILLARY      Result Value Range   Glucose-Capillary 276 (*) 70 - 99 mg/dL   Comment 1 Documented in  Chart     Comment 2 Notify RN    GLUCOSE, CAPILLARY      Result Value Range   Glucose-Capillary 254 (*) 70 - 99 mg/dL   Comment 1 Documented in Chart     Comment 2 Notify RN     Dg Chest 2 View  05/26/2013   CLINICAL DATA:  Pain post trauma  EXAM: CHEST  2 VIEW  COMPARISON:  February 14, 2013  FINDINGS: There is no edema or consolidation. The heart size and pulmonary vascularity are normal. No pneumothorax. No adenopathy. There is an old healed fracture of the posterior right seventh rib. No acute fracture apparent.  IMPRESSION: Old fracture right seventh rib. No acute fracture. No pneumothorax. Lungs clear.   Electronically Signed   By: Bretta Bang M.D.   On: 05/26/2013 10:13   Ct Head Wo Contrast  05/26/2013   CLINICAL DATA:  Fall due to low blood sugar.  Diabetes.  EXAM: CT HEAD WITHOUT CONTRAST  CT CERVICAL SPINE WITHOUT CONTRAST  TECHNIQUE: Multidetector CT imaging of the head and cervical spine was performed following the standard protocol without intravenous contrast. Multiplanar CT image reconstructions of the cervical spine were also generated.  COMPARISON:  CT HEAD W/O CM dated 03/08/2013; CT HEAD W/O CM dated 02/14/2013  FINDINGS: CT HEAD FINDINGS  Sinuses/Soft tissues: No significant soft tissue swelling. Mucosal thickening of the bilateral maxillary sinuses. Clear mastoid air cells.  Intracranial: Moderate cerebral atrophy. Mild to moderate low density in the periventricular white matter likely related to small vessel disease. Mega cisterna magna which is eccentric left. Age advanced vertebral atherosclerosis bilaterally. No mass lesion, hemorrhage, hydrocephalus, acute infarct, intra-axial, or extra-axial fluid collection.  CT CERVICAL SPINE FINDINGS  Spinal visualization through the bottom of T1. Prevertebral soft tissues are within normal limits. Skull base intact. Maintenance of vertebral body height and alignment. Right-sided facet arthropathy at C3-4 which is age advanced. Facets  are well-aligned.  IMPRESSION: 1.  No acute intracranial abnormality. 2.  Cerebral atrophy and small vessel ischemic change. 3. No acute fracture or subluxation within the cervical spine.   Electronically Signed   By: Jeronimo Greaves M.D.   On: 05/26/2013 10:41   Ct Cervical Spine Wo Contrast  05/26/2013   CLINICAL DATA:  Fall due to low blood sugar.  Diabetes.  EXAM: CT HEAD WITHOUT CONTRAST  CT CERVICAL SPINE WITHOUT CONTRAST  TECHNIQUE: Multidetector CT imaging of the head and cervical spine was performed following the standard protocol without intravenous contrast. Multiplanar CT image reconstructions of the cervical spine were also generated.  COMPARISON:  CT HEAD W/O CM dated 03/08/2013; CT HEAD W/O CM dated 02/14/2013  FINDINGS: CT HEAD FINDINGS  Sinuses/Soft tissues: No significant soft tissue swelling. Mucosal thickening of the bilateral maxillary sinuses. Clear mastoid air cells.  Intracranial: Moderate cerebral atrophy. Mild to moderate low density in the periventricular white matter likely related to small vessel disease. Mega cisterna magna which is eccentric left. Age advanced vertebral atherosclerosis bilaterally. No mass lesion, hemorrhage, hydrocephalus, acute infarct, intra-axial, or extra-axial fluid collection.  CT CERVICAL SPINE FINDINGS  Spinal visualization through the bottom of T1. Prevertebral soft tissues are within normal limits. Skull base intact. Maintenance of vertebral body height and alignment. Right-sided facet arthropathy at C3-4 which is age advanced. Facets are well-aligned.  IMPRESSION: 1.  No acute intracranial abnormality. 2.  Cerebral atrophy and small vessel ischemic change. 3. No acute fracture or subluxation within the cervical spine.   Electronically Signed   By: Jeronimo Greaves M.D.   On: 05/26/2013 10:41   Labs Review Labs Reviewed  GLUCOSE, CAPILLARY - Abnormal; Notable for the following:    Glucose-Capillary 182 (*)    All other components within normal limits  CBC  WITH DIFFERENTIAL - Abnormal; Notable for the following:    WBC 12.8 (*)    Neutrophils Relative % 82 (*)    Neutro Abs 10.4 (*)    All other components within normal limits  COMPREHENSIVE METABOLIC PANEL - Abnormal; Notable for the following:    Glucose, Bld 247 (*)    GFR calc non Af Amer 65 (*)    GFR calc Af Amer 75 (*)    All other components within normal limits  DIGOXIN LEVEL - Abnormal; Notable for the following:    Digoxin Level 0.4 (*)    All other components within normal limits  GLUCOSE, CAPILLARY - Abnormal; Notable for the following:    Glucose-Capillary 276 (*)    All other components within normal limits  GLUCOSE, CAPILLARY - Abnormal; Notable for the following:    Glucose-Capillary 254 (*)    All other components within normal limits  PROTIME-INR  APTT  TROPONIN I  TROPONIN I  URINALYSIS, ROUTINE W REFLEX MICROSCOPIC   Imaging Review Dg Chest 2 View  05/26/2013   CLINICAL DATA:  Pain post trauma  EXAM: CHEST  2 VIEW  COMPARISON:  February 14, 2013  FINDINGS: There is no edema or consolidation. The heart size and pulmonary vascularity are normal. No pneumothorax. No adenopathy. There is an old healed fracture of the posterior right seventh rib. No acute fracture apparent.  IMPRESSION: Old fracture right seventh rib. No acute fracture. No pneumothorax. Lungs clear.   Electronically Signed   By: Bretta Bang M.D.   On: 05/26/2013 10:13   Ct Head Wo Contrast  05/26/2013   CLINICAL DATA:  Fall due to low blood sugar.  Diabetes.  EXAM: CT HEAD WITHOUT CONTRAST  CT CERVICAL SPINE WITHOUT CONTRAST  TECHNIQUE: Multidetector CT imaging of the head and cervical spine was performed following the standard protocol without intravenous contrast. Multiplanar CT image reconstructions of the cervical spine were also generated.  COMPARISON:  CT HEAD W/O CM dated 03/08/2013;  CT HEAD W/O CM dated 02/14/2013  FINDINGS: CT HEAD FINDINGS  Sinuses/Soft tissues: No significant soft tissue  swelling. Mucosal thickening of the bilateral maxillary sinuses. Clear mastoid air cells.  Intracranial: Moderate cerebral atrophy. Mild to moderate low density in the periventricular white matter likely related to small vessel disease. Mega cisterna magna which is eccentric left. Age advanced vertebral atherosclerosis bilaterally. No mass lesion, hemorrhage, hydrocephalus, acute infarct, intra-axial, or extra-axial fluid collection.  CT CERVICAL SPINE FINDINGS  Spinal visualization through the bottom of T1. Prevertebral soft tissues are within normal limits. Skull base intact. Maintenance of vertebral body height and alignment. Right-sided facet arthropathy at C3-4 which is age advanced. Facets are well-aligned.  IMPRESSION: 1.  No acute intracranial abnormality. 2.  Cerebral atrophy and small vessel ischemic change. 3. No acute fracture or subluxation within the cervical spine.   Electronically Signed   By: Jeronimo Greaves M.D.   On: 05/26/2013 10:41   Ct Cervical Spine Wo Contrast  05/26/2013   CLINICAL DATA:  Fall due to low blood sugar.  Diabetes.  EXAM: CT HEAD WITHOUT CONTRAST  CT CERVICAL SPINE WITHOUT CONTRAST  TECHNIQUE: Multidetector CT imaging of the head and cervical spine was performed following the standard protocol without intravenous contrast. Multiplanar CT image reconstructions of the cervical spine were also generated.  COMPARISON:  CT HEAD W/O CM dated 03/08/2013; CT HEAD W/O CM dated 02/14/2013  FINDINGS: CT HEAD FINDINGS  Sinuses/Soft tissues: No significant soft tissue swelling. Mucosal thickening of the bilateral maxillary sinuses. Clear mastoid air cells.  Intracranial: Moderate cerebral atrophy. Mild to moderate low density in the periventricular white matter likely related to small vessel disease. Mega cisterna magna which is eccentric left. Age advanced vertebral atherosclerosis bilaterally. No mass lesion, hemorrhage, hydrocephalus, acute infarct, intra-axial, or extra-axial fluid  collection.  CT CERVICAL SPINE FINDINGS  Spinal visualization through the bottom of T1. Prevertebral soft tissues are within normal limits. Skull base intact. Maintenance of vertebral body height and alignment. Right-sided facet arthropathy at C3-4 which is age advanced. Facets are well-aligned.  IMPRESSION: 1.  No acute intracranial abnormality. 2.  Cerebral atrophy and small vessel ischemic change. 3. No acute fracture or subluxation within the cervical spine.   Electronically Signed   By: Jeronimo Greaves M.D.   On: 05/26/2013 10:41    EKG Interpretation   None      Date: 05/26/2013  Rate: 74  Rhythm: normal sinus rhythm  QRS Axis: right  Intervals: QT prolonged  ST/T Wave abnormalities: nonspecific T wave changes  Conduction Disutrbances:none  Narrative Interpretation:   Old EKG Reviewed: unchanged EKG analyzed reviewed by this provider and attending physician    MDM   1. Hypoglycemia     Medications  sodium chloride 0.9 % bolus 1,000 mL (0 mLs Intravenous Stopped 05/26/13 1433)  sodium chloride 0.9 % bolus 1,000 mL (0 mLs Intravenous Stopped 05/26/13 1543)   Filed Vitals:   05/26/13 1345 05/26/13 1400 05/26/13 1415 05/26/13 1547  BP: 124/63 137/76 128/64 150/61  Pulse: 86 85 82 83  Temp:      TempSrc:      Resp: 16 17 18 16   SpO2: 98% 97% 97% 96%    Patient presenting to emergency department with an episode of hypoglycemia and fall that occurred this morning. Mother reported that she heard a thump on the floor and heard a yell. Reported that she found the patient on the floor, reported that she took the patient's CBG which was 35. EMS  was called. En route to the ED patient received 1 mg of glucagon via IM. Upon arrival to the ED the patient's CBG increase to 158. Alert and oriented. GCS 15. Heart rate and rhythm normal. Lungs clear to auscultation. Radial and DP pulses 2+ bilaterally. Bowel sounds normoactive in all 4 quadrants there is negative tenderness upon palpation to the  abdomen. Negative acute abdomen, negative peritoneal signs. Patient follows commands appropriately. Patient speaks in full sentences without difficulty. Negative respiratory distress. Cranial nerves grossly intact. Strength intact to left upper and bilateral lower extremities with equal distribution. Mildly decreased strength localize the right upper extremity secondary to stroke that occurred in May 2012 - chronic finding. Full range of motion to upper and lower extremities without difficulty or ataxia. Negative focal neurological deficits noted. EKG noted normal sinus rhythm with heart rate of 74 bpm - negative ischemic findings noted. First troponin negative elevation. CBC noted mild elevated WBC of 12.8 with negative leukocytosis noted. CMP noted mild elevated glucose of 247 - anion gap of 14.0 mEq per liter, patient currently not in DKA. BUN and Cr are within normal limits. APTT negative elevation. INR negative elevation. Prothrombin time negative elevation. Digoxin level low, 0.4 - doubt dig toxicity. CT head negative acute intracranial abnormalities noted. CT cervical spine negative for fracture or subluxation. Chest xray noted old seventh right rib fracture with no acute fracture noted - negative acute abnormalities noted. Second troponin negative elevation. CBG 254.  Negative for pneumonia. Negative PTX. Doubt stroke. Doubt digoxin toxicity. Negative DKA episode. Patient presenting with episode of hypoglycemia. Patient doing well. Patient able to tolerate food PO without difficulty. Patient is not in DKA. Glucose levels have increased since being in the ED setting and have been stable - patient has been monitored for at least 5 hours with negative drop in glucose levels, they have been stable. Patient stable, afebrile. Discharged patient. Referred patient to her PCP to be re-assessed within 24 hours. Discussed with patient to rest and stay hydrated. Discussed with patient to continue to monitor glucose  levels closely. Discussed with patient to closely monitor symptoms and if symptoms are to worsen or change to report back to the ED - strict return instructions given.  Patient agreed to plan of care, understood, all questions answered.   05/28/2013 Urine resulted after patient discharged - positive for nitrites with positive trace of leukocytes with many bacteria and WBC of 11-20, positive pyuria - high suspicion of pyelonephritis. Reviewed remaining labs - patient had mild elevation of WBC and neutrophils. Patient was afebrile, negative tachycardia, negative tachypnea, negative drop in blood pressure. Patient appeared well. Patient did not appear septic. 8:23 AM This provider called in prescription of Ciprofloxacin 500 mg BID x 7 days. 8:28 AM This provider spoke with the patient's caregiver, her mother. Mother reported that the patient is doing well and that her glucose levels have been stable, like normal. Reported that patient's sugar was 107 this morning. This provider discussed findings in the urine and concern. Mother denied patient have a fever or decrease in appetite - reported that the patient has been eating well, has been having normal BM and urination - patient at baseline as per mother. This provider highly recommended that patient report back to the ED to be re-assessed and will possible need admission for IV antibiotics. Mother understood and agreed.   Raymon Mutton, PA-C 05/28/13 2047

## 2013-05-26 NOTE — ED Notes (Signed)
Informed pt. That we will need an urine specimen.

## 2013-05-26 NOTE — ED Notes (Signed)
Pt. Rolled out of the bed thsis am, heard by her mother.  No visible injuries. MOther checked her CBG 32. Pt. Was disoriented upon [facility] arrival.  Pheripheral IVs X3, unsuccessful, glucagon 1mg , IM CBg went to 158.  Pt. Became alert and oriented.   Pt. Denies any pain or discomfort.

## 2013-05-26 NOTE — ED Notes (Signed)
Pt. Ambulated to the bathroom, with Alver FisherNikki Kohut, RN.  Gait steady, reported by Lowella BandyNikki, Charity fundraiserN.  Ordered lunch for pt.

## 2013-05-26 NOTE — ED Notes (Signed)
Lo

## 2013-05-26 NOTE — ED Notes (Signed)
Updated pt. And family on plan of care.

## 2013-05-28 ENCOUNTER — Encounter (HOSPITAL_COMMUNITY): Payer: Self-pay | Admitting: Emergency Medicine

## 2013-05-28 ENCOUNTER — Emergency Department (HOSPITAL_COMMUNITY)
Admission: EM | Admit: 2013-05-28 | Discharge: 2013-05-28 | Disposition: A | Discharge status: Home / Self Care | Payer: Medicare Other | Attending: Emergency Medicine | Admitting: Emergency Medicine

## 2013-05-28 DIAGNOSIS — Z8719 Personal history of other diseases of the digestive system: Secondary | ICD-10-CM | POA: Insufficient documentation

## 2013-05-28 DIAGNOSIS — F3289 Other specified depressive episodes: Secondary | ICD-10-CM | POA: Insufficient documentation

## 2013-05-28 DIAGNOSIS — E559 Vitamin D deficiency, unspecified: Secondary | ICD-10-CM | POA: Insufficient documentation

## 2013-05-28 DIAGNOSIS — D509 Iron deficiency anemia, unspecified: Secondary | ICD-10-CM | POA: Insufficient documentation

## 2013-05-28 DIAGNOSIS — I252 Old myocardial infarction: Secondary | ICD-10-CM | POA: Insufficient documentation

## 2013-05-28 DIAGNOSIS — E1049 Type 1 diabetes mellitus with other diabetic neurological complication: Secondary | ICD-10-CM | POA: Insufficient documentation

## 2013-05-28 DIAGNOSIS — R739 Hyperglycemia, unspecified: Secondary | ICD-10-CM

## 2013-05-28 DIAGNOSIS — N390 Urinary tract infection, site not specified: Secondary | ICD-10-CM | POA: Insufficient documentation

## 2013-05-28 DIAGNOSIS — I5020 Unspecified systolic (congestive) heart failure: Secondary | ICD-10-CM | POA: Insufficient documentation

## 2013-05-28 DIAGNOSIS — E1142 Type 2 diabetes mellitus with diabetic polyneuropathy: Secondary | ICD-10-CM | POA: Insufficient documentation

## 2013-05-28 DIAGNOSIS — E785 Hyperlipidemia, unspecified: Secondary | ICD-10-CM | POA: Insufficient documentation

## 2013-05-28 DIAGNOSIS — Z8739 Personal history of other diseases of the musculoskeletal system and connective tissue: Secondary | ICD-10-CM | POA: Insufficient documentation

## 2013-05-28 DIAGNOSIS — Z79899 Other long term (current) drug therapy: Secondary | ICD-10-CM | POA: Insufficient documentation

## 2013-05-28 DIAGNOSIS — I2510 Atherosclerotic heart disease of native coronary artery without angina pectoris: Secondary | ICD-10-CM | POA: Insufficient documentation

## 2013-05-28 DIAGNOSIS — Z8614 Personal history of Methicillin resistant Staphylococcus aureus infection: Secondary | ICD-10-CM | POA: Insufficient documentation

## 2013-05-28 DIAGNOSIS — Z794 Long term (current) use of insulin: Secondary | ICD-10-CM | POA: Insufficient documentation

## 2013-05-28 DIAGNOSIS — I10 Essential (primary) hypertension: Secondary | ICD-10-CM | POA: Insufficient documentation

## 2013-05-28 DIAGNOSIS — Z7902 Long term (current) use of antithrombotics/antiplatelets: Secondary | ICD-10-CM | POA: Insufficient documentation

## 2013-05-28 DIAGNOSIS — Z7982 Long term (current) use of aspirin: Secondary | ICD-10-CM | POA: Insufficient documentation

## 2013-05-28 DIAGNOSIS — Z8673 Personal history of transient ischemic attack (TIA), and cerebral infarction without residual deficits: Secondary | ICD-10-CM | POA: Insufficient documentation

## 2013-05-28 DIAGNOSIS — F329 Major depressive disorder, single episode, unspecified: Secondary | ICD-10-CM | POA: Insufficient documentation

## 2013-05-28 LAB — CBC WITH DIFFERENTIAL/PLATELET
Basophils Absolute: 0 10*3/uL (ref 0.0–0.1)
Basophils Relative: 0 % (ref 0–1)
Eosinophils Absolute: 0.3 10*3/uL (ref 0.0–0.7)
Eosinophils Relative: 3 % (ref 0–5)
HEMATOCRIT: 41.5 % (ref 36.0–46.0)
HEMOGLOBIN: 14.1 g/dL (ref 12.0–15.0)
LYMPHS ABS: 2.3 10*3/uL (ref 0.7–4.0)
LYMPHS PCT: 23 % (ref 12–46)
MCH: 31.8 pg (ref 26.0–34.0)
MCHC: 34 g/dL (ref 30.0–36.0)
MCV: 93.5 fL (ref 78.0–100.0)
MONO ABS: 0.7 10*3/uL (ref 0.1–1.0)
Monocytes Relative: 7 % (ref 3–12)
Neutro Abs: 6.5 10*3/uL (ref 1.7–7.7)
Neutrophils Relative %: 67 % (ref 43–77)
Platelets: 232 10*3/uL (ref 150–400)
RBC: 4.44 MIL/uL (ref 3.87–5.11)
RDW: 13.4 % (ref 11.5–15.5)
WBC: 9.7 10*3/uL (ref 4.0–10.5)

## 2013-05-28 LAB — URINE CULTURE: Colony Count: >=100000

## 2013-05-28 LAB — COMPREHENSIVE METABOLIC PANEL
ALT: 21 U/L (ref 0–35)
AST: 28 U/L (ref 0–37)
Albumin: 3.8 g/dL (ref 3.5–5.2)
Alkaline Phosphatase: 67 U/L (ref 39–117)
BUN: 17 mg/dL (ref 6–23)
CALCIUM: 9.5 mg/dL (ref 8.4–10.5)
CO2: 26 meq/L (ref 19–32)
CREATININE: 1.07 mg/dL (ref 0.50–1.10)
Chloride: 100 mEq/L (ref 96–112)
GFR calc Af Amer: 67 mL/min — ABNORMAL LOW (ref >90)
GFR calc non Af Amer: 58 mL/min — ABNORMAL LOW (ref >90)
GLUCOSE: 449 mg/dL — AB (ref 70–99)
Potassium: 4.8 mEq/L (ref 3.7–5.3)
SODIUM: 141 meq/L (ref 137–147)
Total Bilirubin: 0.5 mg/dL (ref 0.3–1.2)
Total Protein: 7.7 g/dL (ref 6.0–8.3)

## 2013-05-28 LAB — GLUCOSE, CAPILLARY
GLUCOSE-CAPILLARY: 390 mg/dL — AB (ref 70–99)
Glucose-Capillary: 375 mg/dL — ABNORMAL HIGH (ref 70–99)
Glucose-Capillary: 383 mg/dL — ABNORMAL HIGH (ref 70–99)

## 2013-05-28 LAB — CG4 I-STAT (LACTIC ACID): Lactic Acid, Venous: 1.46 mmol/L (ref 0.5–2.2)

## 2013-05-28 MED ORDER — INSULIN ASPART 100 UNIT/ML ~~LOC~~ SOLN: 8.0000 [IU] | Freq: Once | SUBCUTANEOUS | Status: DC

## 2013-05-28 MED ORDER — CEPHALEXIN 500 MG PO CAPS: 500.0000 mg | ORAL_CAPSULE | Freq: Four times a day (QID) | ORAL | Status: DC

## 2013-05-28 MED ORDER — INSULIN ASPART 100 UNIT/ML ~~LOC~~ SOLN
5.0000 [IU] | Freq: Once | SUBCUTANEOUS | Status: AC
  Administered 2013-05-28: 5 [IU] via INTRAVENOUS
  Filled 2013-05-28: qty 1

## 2013-05-28 MED ORDER — CEPHALEXIN 250 MG PO CAPS
500.0000 mg | ORAL_CAPSULE | Freq: Once | ORAL | Status: AC
  Administered 2013-05-28: 500 mg via ORAL
  Filled 2013-05-28: qty 2

## 2013-05-28 NOTE — ED Notes (Signed)
Pt given PO fluids, tolerated well

## 2013-05-28 NOTE — ED Notes (Signed)
Pt reports being seen here two days ago for hypoglycemia and fall. They had sent urine culture and was called today and told to come here for treatment of a bad UTI. Pt denies any urinary symptoms, has no complaints.

## 2013-05-28 NOTE — ED Provider Notes (Signed)
CSN: [id number]     Arrival date & time 05/28/13  1153 History   First MD Initiated Contact with Patient 05/28/13 1459     Chief Complaint  Patient presents with  . Urinary Tract Infection   (Consider location/radiation/quality/duration/timing/severity/associated sxs/prior Treatment) HPI Comments: Patient seen in ED 2 days ago after hypoglycemic episode and fall. Told to return today for positive urinalysis results. Patient states she feels fine. She denies any chest pain, shortness of breath, nausea vomiting. No abdominal pain, fever or back pain. Her sugars have been well controlled at home. She denies any dizziness or lightheadedness. No previous history of UTIs. She denies any urinary symptoms.  The history is provided by the spouse and the patient.    Past Medical History  Diagnosis Date  . Hypertension   . Vitamin D deficiency   . Coronary artery disease     a. (12/11) NSTEMI, in the setting of DKA, s/p Cath--LAD totalled in the midportion, diag 70% sten (small 2-mm ), LCx nonobst, OM1 40%, OM2 30%, RCA- no dz. chronic calcified LAD --> no intervention.  b. (06/2010) NSTEMI s/p cath showing same findings   . Ischemic cardiomyopathy   . Orthostatic hypotension     a. sycopal episode in 12/11   . Diabetes mellitus     a. Insulin dependant Type I   . Hyperlipidemia   . Diabetic nephropathy   . Diabetic retinopathy   . Gastroparesis   . Depression   . Systolic heart failure     a. (08/2012) EF: 40% Mod hypokin in mid-dist-ant-sept myocard--> psuedonml L vent fill pattern, Grd 2 diast dyscfxn  . Iron deficiency anemia   . Hx MRSA infection   . Osteopenia   . Esophagitis   . NSTEMI (non-ST elevated myocardial infarction)     a. (03/2010) NSTEMI during admission for DKA  b.  . Stroke     a. (2012) residual R hand/arm weakness  b. while on ASA and plavix  . Diabetic neuropathy    Past Surgical History  Procedure Laterality Date  . Appendectomy  1980  . Tubal ligation  1987  .  Cesarean section  1984  . Refractive surgery      x3   Family History  Problem Relation Age of Onset  . Pancreatic cancer Father   . Colon cancer Neg Hx   . Diabetes Maternal Grandfather    History  Substance Use Topics  . Smoking status: Never Smoker   . Smokeless tobacco: Never Used  . Alcohol Use: No   OB History   Grav Para Term Preterm Abortions TAB SAB Ect Mult Living                 Review of Systems  Constitutional: Negative for fever, activity change and appetite change.  HENT: Negative for congestion and rhinorrhea.   Respiratory: Negative for cough, chest tightness and shortness of breath.   Cardiovascular: Negative for chest pain.  Gastrointestinal: Negative for nausea and abdominal pain.  Genitourinary: Negative for dysuria, hematuria, vaginal bleeding and vaginal discharge.  Musculoskeletal: Negative for back pain.  Skin: Negative for rash.  Neurological: Negative for dizziness and weakness.  A complete 10 system review of systems was obtained and all systems are negative except as noted in the HPI and PMH.    Allergies  Erythromycin; Food; and Metoclopramide hcl  Home Medications   Current Outpatient Rx  Name  Route  Sig  Dispense  Refill  . ALPRAZolam (XANAX) 0.5 MG  tablet   Oral   Take 0.5 mg by mouth every morning.          [name] aspirin EC 81 MG tablet   Oral   Take 81 mg by mouth at bedtime.          . Calcium-Magnesium-Vitamin D (CALCIUM 500 PO)   Oral   Take 500 mg by mouth daily with lunch.         . carvedilol (COREG) 6.25 MG tablet   Oral   Take 6.25 mg by mouth 2 (two) times daily.         . citalopram (CELEXA) 20 MG tablet   Oral   Take 20 mg by mouth daily.          . clopidogrel (PLAVIX) 75 MG tablet   Oral   Take 1 tablet (75 mg total) by mouth daily.   30 tablet   6   . digoxin (LANOXIN) 0.125 MG tablet   Oral   Take 0.0625 mg by mouth every other day.         . ferrous sulfate 325 (65 FE) MG tablet    Oral   Take 650 mg by mouth daily with supper.          . furosemide (LASIX) 20 MG tablet   Oral   Take 1 tablet (20 mg total) by mouth daily.   30 tablet   8   . insulin glargine (LANTUS) 100 UNIT/ML injection   Subcutaneous   Inject 18-20 Units into the skin 2 (two) times daily. 20u in the am.  18u in the pm.         . insulin lispro (HUMALOG) 100 UNIT/ML injection   Subcutaneous   Inject 2-4 Units into the skin See admin instructions. Sliding scale - patient injects 2-4 units subq as needed depending on meter reading         . Multiple Vitamin (MULTIVITAMIN WITH MINERALS) TABS tablet   Oral   Take 1 tablet by mouth daily with lunch.         . rosuvastatin (CRESTOR) 40 MG tablet   Oral   Take 40 mg by mouth daily with supper.    30 tablet   11   . Vitamin D, Ergocalciferol, (DRISDOL) 50000 UNITS CAPS   Oral   Take 50,000 Units by mouth every 7 (seven) days. Saturday         . cephALEXin (KEFLEX) 500 MG capsule   Oral   Take 1 capsule (500 mg total) by mouth 4 (four) times daily.   40 capsule   0    BP 120/59  Pulse 67  Temp(Src) 97.3 F (36.3 C) (Oral)  Resp 18  SpO2 100%  LMP 03/13/2011 Physical Exam  Constitutional: She is oriented to person, place, and time. She appears well-developed and well-nourished. No distress.  Appears well, smiling, no distress  HENT:  Head: Normocephalic and atraumatic.  Mouth/Throat: Oropharynx is clear and moist. No oropharyngeal exudate.  Eyes: Conjunctivae and EOM are normal. Pupils are equal, round, and reactive to light.  Neck: Normal range of motion. Neck supple.  Cardiovascular: Normal rate, regular rhythm and normal heart sounds.   Pulmonary/Chest: Effort normal and breath sounds normal.  Abdominal: Soft. There is no tenderness. There is no rebound and no guarding.  Musculoskeletal: Normal range of motion. She exhibits no edema and no tenderness.  No CVAT  Neurological: She is alert and oriented to person,  place, and time. No cranial  nerve deficit. She exhibits normal muscle tone. Coordination normal.  RUE weakness at baseline.    ED Course  Procedures (including critical care time) Labs Review Labs Reviewed  GLUCOSE, CAPILLARY - Abnormal; Notable for the following:    Glucose-Capillary 375 (*)    All other components within normal limits  COMPREHENSIVE METABOLIC PANEL - Abnormal; Notable for the following:    Glucose, Bld 449 (*)    GFR calc non Af Amer 58 (*)    GFR calc Af Amer 67 (*)    All other components within normal limits  GLUCOSE, CAPILLARY - Abnormal; Notable for the following:    Glucose-Capillary 383 (*)    All other components within normal limits  GLUCOSE, CAPILLARY - Abnormal; Notable for the following:    Glucose-Capillary 390 (*)    All other components within normal limits  CBC WITH DIFFERENTIAL  CG4 I-STAT (LACTIC ACID)   Imaging Review No results found.  EKG Interpretation   None       MDM   1. Urinary tract infection   2. Hyperglycemia    Patient found to have UTI. Culture grew Escherichia coli that was pansensitive. She denies any symptoms. She has no abdominal pain, back pain, nausea vomiting or fever.  Unclear why patient was instructed to return to the ED. She is able to take antibiotics at home.  Hyperglycemia without evidence of DKA. Anion gap 15.  Patient appears well and nontoxic and is tolerating by mouth in the ED. She has no abdominal pain or back pain. She has no fever tachycardia. She'll be started on by mouth antibiotics for her UTI. Blood sugar has improved at 390. He is anxious to go home. There is no evidence of DKA.   BP 120/59  Pulse 67  Temp(Src) 97.3 F (36.3 C) (Oral)  Resp 18  SpO2 100%  LMP 03/13/2011   Glynn Octave, MD 05/28/13 1753

## 2013-05-28 NOTE — Discharge Instructions (Signed)
Urinary Tract Infection °Urinary tract infections (UTIs) can develop anywhere along your urinary tract. Your urinary tract is your body's drainage system for removing wastes and extra water. Your urinary tract includes two kidneys, two ureters, a bladder, and a urethra. Your kidneys are a pair of bean-shaped organs. Each kidney is about the size of your fist. They are located below your ribs, one on each side of your spine. °CAUSES °Infections are caused by microbes, which are microscopic organisms, including fungi, viruses, and bacteria. These organisms are so small that they can only be seen through a microscope. Bacteria are the microbes that most commonly cause UTIs. °SYMPTOMS  °Symptoms of UTIs may vary by age and gender of the patient and by the location of the infection. Symptoms in young women typically include a frequent and intense urge to urinate and a painful, burning feeling in the bladder or urethra during urination. Older women and men are more likely to be tired, shaky, and weak and have muscle aches and abdominal pain. A fever may mean the infection is in your kidneys. Other symptoms of a kidney infection include pain in your back or sides below the ribs, nausea, and vomiting. °DIAGNOSIS °To diagnose a UTI, your caregiver will ask you about your symptoms. Your caregiver also will ask to provide a urine sample. The urine sample will be tested for bacteria and white blood cells. White blood cells are made by your body to help fight infection. °TREATMENT  °Typically, UTIs can be treated with medication. Because most UTIs are caused by a bacterial infection, they usually can be treated with the use of antibiotics. The choice of antibiotic and length of treatment depend on your symptoms and the type of bacteria causing your infection. °HOME CARE INSTRUCTIONS °· If you were prescribed antibiotics, take them exactly as your caregiver instructs you. Finish the medication even if you feel better after you  have only taken some of the medication. °· Drink enough water and fluids to keep your urine clear or pale yellow. °· Avoid caffeine, tea, and carbonated beverages. They tend to irritate your bladder. °· Empty your bladder often. Avoid holding urine for long periods of time. °· Empty your bladder before and after sexual intercourse. °· After a bowel movement, women should cleanse from front to back. Use each tissue only once. °SEEK MEDICAL CARE IF:  °· You have back pain. °· You develop a fever. °· Your symptoms do not begin to resolve within 3 days. °SEEK IMMEDIATE MEDICAL CARE IF:  °· You have severe back pain or lower abdominal pain. °· You develop chills. °· You have nausea or vomiting. °· You have continued burning or discomfort with urination. °MAKE SURE YOU:  °· Understand these instructions. °· Will watch your condition. °· Will get help right away if you are not doing well or get worse. °Document Released: 01/17/2005 Document Revised: 10/09/2011 Document Reviewed: 05/18/2011 °Exit[employer]® Patient Information ©2014 [employer]. °Hyperglycemia °Hyperglycemia occurs when the glucose (sugar) in your blood is too high. Hyperglycemia can happen for many reasons, but it most often happens to people who do not know they have diabetes or are not managing their diabetes properly.  °CAUSES  °Whether you have diabetes or not, there are other causes of hyperglycemia. Hyperglycemia can occur when you have diabetes, but it can also occur in other situations that you might not be as aware of, such as: °Diabetes °· If you have diabetes and are having problems controlling your blood glucose, hyperglycemia could   occur because of some of the following reasons: °· Not following your meal plan. °· Not taking your diabetes medications or not taking it properly. °· Exercising less or doing less activity than you normally do. °· Being sick. °Pre-diabetes °· This cannot be ignored. Before people develop Type 2 diabetes, they almost  always have "pre-diabetes." This is when your blood glucose levels are higher than normal, but not yet high enough to be diagnosed as diabetes. Research has shown that some long-term damage to the body, especially the heart and circulatory system, may already be occurring during pre-diabetes. If you take action to manage your blood glucose when you have pre-diabetes, you may delay or prevent Type 2 diabetes from developing. °Stress °· If you have diabetes, you may be "diet" controlled or on oral medications or insulin to control your diabetes. However, you may find that your blood glucose is higher than usual in the hospital whether you have diabetes or not. This is often referred to as "stress hyperglycemia." Stress can elevate your blood glucose. This happens because of hormones put out by the body during times of stress. If stress has been the cause of your high blood glucose, it can be followed regularly by your caregiver. That way he/she can make sure your hyperglycemia does not continue to get worse or progress to diabetes. °Steroids °· Steroids are medications that act on the infection fighting system (immune system) to block inflammation or infection. One side effect can be a rise in blood glucose. Most people can produce enough extra insulin to allow for this rise, but for those who cannot, steroids make blood glucose levels go even higher. It is not unusual for steroid treatments to "uncover" diabetes that is developing. It is not always possible to determine if the hyperglycemia will go away after the steroids are stopped. A special blood test called an A1c is sometimes done to determine if your blood glucose was elevated before the steroids were started. °SYMPTOMS °· Thirsty. °· Frequent urination. °· Dry mouth. °· Blurred vision. °· Tired or fatigue. °· Weakness. °· Sleepy. °· Tingling in feet or leg. °DIAGNOSIS  °Diagnosis is made by monitoring blood glucose in one or all of the following ways: °· A1c  test. This is a chemical found in your blood. °· Fingerstick blood glucose monitoring. °· Laboratory results. °TREATMENT  °First, knowing the cause of the hyperglycemia is important before the hyperglycemia can be treated. Treatment may include, but is not be limited to: °· Education. °· Change or adjustment in medications. °· Change or adjustment in meal plan. °· Treatment for an illness, infection, etc. °· More frequent blood glucose monitoring. °· Change in exercise plan. °· Decreasing or stopping steroids. °· Lifestyle changes. °HOME CARE INSTRUCTIONS  °· Test your blood glucose as directed. °· Exercise regularly. Your caregiver will give you instructions about exercise. Pre-diabetes or diabetes which comes on with stress is helped by exercising. °· Eat wholesome, balanced meals. Eat often and at regular, fixed times. Your caregiver or nutritionist will give you a meal plan to guide your sugar intake. °· Being at an ideal weight is important. If needed, losing as little as 10 to 15 pounds may help improve blood glucose levels. °SEEK MEDICAL CARE IF:  °· You have questions about medicine, activity, or diet. °· You continue to have symptoms (problems such as increased thirst, urination, or weight gain). °SEEK IMMEDIATE MEDICAL CARE IF:  °· You are vomiting or have diarrhea. °· Your breath smells fruity. °·   You are breathing faster or slower. °· You are very sleepy or incoherent. °· You have numbness, tingling, or pain in your feet or hands. °· You have chest pain. °· Your symptoms get worse even though you have been following your caregiver's orders. °· If you have any other questions or concerns. °Document Released: 10/03/2000 Document Revised: 07/02/2011 Document Reviewed: 08/06/2011 °ExitCare® Patient Information ©2014 [employer]. ° °

## 2013-05-30 ENCOUNTER — Telehealth (HOSPITAL_COMMUNITY): Payer: Self-pay | Admitting: Emergency Medicine

## 2013-05-30 NOTE — ED Notes (Signed)
Post ED Visit - Positive Culture Follow-up  Culture report reviewed by antimicrobial stewardship pharmacist: []  Wes Dulaney, Pharm.D., BCPS []  Celedonio MiyamotoJeremy Frens, Pharm.D., BCPS []  Georgina PillionElizabeth Martin, [address]Ph[address]D., BCPS [x]  MillertonMinh Pham, [place]arm[place]D., BCPS, AAHIVP []  Estella HuskMichelle Turner, Pharm.D., BCPS, AAHIVP  Positive urine culture Treated with Keflex, organism sensitive to the same and no further patient follow-up is required at this time.  Zeb ComfortHolland, Eusebia Grulke 05/30/2013, 9:53 AM

## 2013-05-31 NOTE — ED Provider Notes (Signed)
Medical screening examination/treatment/procedure(s) were performed by non-physician practitioner and as supervising physician I was immediately available for consultation/collaboration.  EKG Interpretation    Date/Time:  Tuesday May 26 2013 09:19:44 EST Ventricular Rate:  74 PR Interval:  146 QRS Duration: 80 QT Interval:  436 QTC Calculation: 484 R Axis:   103 Text Interpretation:  Sinus rhythm Low voltage with right axis deviation Abnormal lateral Q waves Probable anteroseptal infarct, old ED PHYSICIAN INTERPRETATION AVAILABLE IN [facility] Confirmed by TEST, RECORD ([id number]) on 05/28/2013 7:07:13 AM             Raeford RazorStephen Simone Rodenbeck, MD 05/31/13 2238

## 2013-07-03 ENCOUNTER — Encounter (HOSPITAL_COMMUNITY): Payer: Self-pay

## 2013-07-03 ENCOUNTER — Ambulatory Visit (HOSPITAL_BASED_OUTPATIENT_CLINIC_OR_DEPARTMENT_OTHER): Payer: Medicare Other | Admitting: Radiology

## 2013-07-03 ENCOUNTER — Encounter (HOSPITAL_COMMUNITY): Payer: Self-pay | Admitting: *Deleted

## 2013-07-03 ENCOUNTER — Ambulatory Visit (HOSPITAL_COMMUNITY)
Admission: RE | Admit: 2013-07-03 | Discharge: 2013-07-03 | Disposition: A | Discharge status: Home / Self Care | Payer: Medicare Other | Source: Ambulatory Visit | Attending: Internal Medicine | Admitting: Internal Medicine

## 2013-07-03 VITALS — BP 116/62 | HR 70 | Wt 167.0 lb

## 2013-07-03 DIAGNOSIS — I6350 Cerebral infarction due to unspecified occlusion or stenosis of unspecified cerebral artery: Secondary | ICD-10-CM

## 2013-07-03 DIAGNOSIS — E785 Hyperlipidemia, unspecified: Secondary | ICD-10-CM | POA: Insufficient documentation

## 2013-07-03 DIAGNOSIS — I2510 Atherosclerotic heart disease of native coronary artery without angina pectoris: Secondary | ICD-10-CM

## 2013-07-03 DIAGNOSIS — I5022 Chronic systolic (congestive) heart failure: Secondary | ICD-10-CM | POA: Insufficient documentation

## 2013-07-03 DIAGNOSIS — I639 Cerebral infarction, unspecified: Secondary | ICD-10-CM

## 2013-07-03 LAB — LIPID PANEL
Cholesterol: 138 mg/dL (ref 0–200)
HDL: 52 mg/dL (ref >39)
LDL Cholesterol: 64 mg/dL (ref 0–99)
Total CHOL/HDL Ratio: 2.7 RATIO
Triglycerides: 112 mg/dL (ref <150)
VLDL: 22 mg/dL (ref 0–40)

## 2013-07-03 NOTE — Progress Notes (Signed)
Echocardiogram performed.  

## 2013-07-03 NOTE — Patient Instructions (Signed)
Lab today  We will contact you in 3 months to schedule your next appointment.  

## 2013-07-04 NOTE — Progress Notes (Signed)
Patient ID: [name], female   DOB: July 17, 1960, 53 y.o.   MRN: [id number] PCP: Dr. Evlyn Kanner  53 yo with history of Type I diabetes, CAD, and ischemic cardiomyopathy presents for cardiology followup.  She had a prolonged admission with DKA and systolic CHF in 12/11.  Cardiac enzymes were found to be elevated and left heart cath showed occluded LAD, which appeared chronic at the time of cath.  Echo showed peri-apical akinesis with EF 30%.  Management was complicated by orthostatic hypotension causing syncope.  This was likely due to combination of low cardiac output and diabetic autonomic neuropathy.  This limited titration of cardiac medications initially. Repeat echo (1/12) showed EF 40-45% (improved).  She was readmitted with DKA in 3/12, and cardiac enzymes were noted to be quite elevated with troponin peaking at 24 and CKMB at 69.  Repeat LHC was done, actually showing no significant difference from the 12/11 study.    In 5/12, she noted weakness one day in her right leg and arm.  She had a head MRI done, showing a small acute left parietal infarction.  This stroke occurred while on ASA and Plavix.  She has improved but her left arm still has some weakness. Carotid dopplers showed no significant carotid stenosis.  I did a limited echo to look for LV thrombus, and this was not seen.  3 week event monitor showed no atrial fibrillation.    Echo in 5/14 showed EF 40%.  We had to cut back on her cardiac meds due to hyperkalemia and hypotension.  She is now off ACEI completely (was hypotensive with even a low dose).  She was admitted again in 12/14 with DKA and elevated troponin (peak 2).  No chest pain.  Echo in 12/4 showed EF down to 25-30% but wall motion abnormality pattern was the same.  Given elevated creatinine, no chest pain, and very elevated troponin at prior admit with DKA without new coronary disease, I did not cath her.    Since last visit, she has been doing better.  Blood glucose is under more  control.  She is not very active and uses her walker for stability but denies exertional dyspnea.  No chest pain.  No orthopnea/PND.  No tachypalpitations.  Echo was done today, which I reviewed.  EF 40-45% with anteroseptal and apical severe hypokinesis to akinesis.    Labs (12/11): K 3.7, creatinine 0.88, BNP 612, LDL 29, HDL 44 Labs (1/12): digoxin 1.0, BNP 199, K 4.4, creatinine 0.9 Labs (5/12): digoxin 0.4 Labs (6/12): K 4.2, creatinine 0.99 Labs (7/12): K 6.2, creatinine 1.3 (KCl and spironolactone held).  Labs (7/12), repeat: K 4.8, creatinine 1.2, BNP 173 Labs (11/12): K 4.6, creatinine 1.6 => 1.3, TnI 0.7 Labs (12/12): K 4.9, creatinine 1.5, LDL 76, HDL 55, BNP 103, digoxin 1.0 Labs (3/13): K 4.2, creatinine 1.1, digoxin 1.0 Labs (6/13): LDL 57, HDL 51, digoxin 0.3 Labs (40/98): K 4.4, creatinine 1.4 Labs (1/14): BNP 253 => 108, K 4.4, creatinine 1.3 Labs (4/14): K 3.7, creatinine 1.2, LDL 61, HDL 57 Labs (12/14): K 4.2, creatinine 1.79, digoxin 0.7 Labs (2/15): K 4.8, creatinine 1.07, digoxin 0.4, HCT 41.5  Allergies (verified):  1)  Reglan 2)  Erythromycin  Past Medical History: 1. Hypertension: However, after MI had orthostatic hypotension.  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 CAD: NSTEMI 12/11.  Cardiac catheterization (12/11) showing LAD totalled in the midportion, diagonal 1 70% stenosed (small 2-mm vessel), circumflex nonobstructive, OM1 40%,  OM2 30%, RCA small and no significant disease.  LAD occlusion was calcified and appeared chronic at the time of catheterization so no intervention.  Patient readmitted with DKA in 3/12, troponin found to be 24 and CKMB 69.  Repeat LHC showed no change from 12/11 study.  5. Ischemic cardiomyopathy: Echo (12/11) with EF 30% and periapical akinesis, no significant MR, RV looked normal.  QRS is not wide on ECG.  Echo (1/12): EF 40-45%, severe hypokinesis of the anteroseptal wall, apical inferior wall, inferoseptal wall, and  apex.  Hyperkalemia with spironolactone and with > 2.5 mg daily enalapril.  Echo 7/12 with EF 40-45%.  Echo (5/14): EF 40%, mid-apical anteroseptal hypokinesis, grade II diastolic dysfunction. Echo (12/14) with EF 25-30%, periapical akinesis. Echo (3/15) with EF 40-45%, anteroseptal and apical severe hypokinesis to akinesis.  6. Orthostatic hypotension: Syncopal episode in 12/11 likely due to this.  Probably from combination of depressed cardiac output and diabetic autonomic neuropathy.  7. Type 1 insulin-dependent diabetes. History of DKA.  8. Hyperlipidemia.  9. Diabetic retinopathy.  10. Gastroparesis.  11. Diabetic nephropathy  12. Depression.  13. 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  Carotid dopplers (7/12) with no significant disease.  Limited echo in 7/12 did not show an LV apical thrombus.  3-week event monitor in 7/12 did not show any runs of atrial fibrillation.   Family History: No FH of Colon Cancer: Family History of Pancreatic Cancer: Father deceased [id number] No premature CAD  Social History: Divorced, 1 boy unemployed, lives in [place] Patient has never smoked.  Alcohol Use - no Daily Caffeine Use 1 cup/day Illicit Drug Use - no  ROS: All systems reviewed and negative except as per HPI.    Current Outpatient Prescriptions  Medication Sig Dispense Refill  . ALPRAZolam (XANAX) 0.5 MG tablet Take 0.5 mg by mouth every morning.       [name]. aspirin EC 81 MG tablet Take 81 mg by mouth at bedtime.       . Calcium-Magnesium-Vitamin D (CALCIUM 500 PO) Take 500 mg by mouth daily with lunch.      . carvedilol (COREG) 6.25 MG tablet Take 6.25 mg by mouth 2 (two) times daily.      . citalopram (CELEXA) 20 MG tablet Take 40 mg by mouth daily.       . clopidogrel (PLAVIX) 75 MG tablet Take 1 tablet (75 mg total) by mouth daily.  30 tablet  6  . digoxin (LANOXIN) 0.125 MG tablet Take 0.0625 mg by mouth every other day.      . ferrous sulfate 325 (65 FE) MG tablet Take 650 mg by  mouth daily with supper.       . furosemide (LASIX) 20 MG tablet Take 1 tablet (20 mg total) by mouth daily.  30 tablet  8  . insulin glargine (LANTUS) 100 UNIT/ML injection Inject 18-20 Units into the skin 2 (two) times daily. 20u in the am.  18u in the pm.      . insulin lispro (HUMALOG) 100 UNIT/ML injection Inject 2-4 Units into the skin See admin instructions. Sliding scale - patient injects 2-4 units subq as needed depending on meter reading      . Multiple Vitamin (MULTIVITAMIN WITH MINERALS) TABS tablet Take 1 tablet by mouth daily with lunch.      . rosuvastatin (CRESTOR) 40 MG tablet Take 40 mg by mouth daily with supper.   30 tablet  11  . Vitamin D, Ergocalciferol, (DRISDOL) 50000 UNITS CAPS Take  50,000 Units by mouth every 7 (seven) days. Saturday       No current facility-administered medications for this encounter.    BP 116/62  Pulse 70  Wt 167 lb (75.751 kg)  SpO2 98%  LMP 03/13/2011 General:  Well developed, well nourished, in no acute distress. Neck:  Neck thick, JVP difficult. No masses, thyromegaly or abnormal cervical nodes. Lungs:  Crackles right base Heart:  Non-displaced PMI, chest non-tender; regular rate and rhythm, S1, S2 without murmurs, rubs or gallops. Carotid upstroke normal, no bruit. Pedals normal pulses. No edema.   Abdomen:  Bowel sounds positive; abdomen soft and non-tender without masses, organomegaly, or hernias noted. No hepatosplenomegaly. Extremities:  No clubbing or cyanosis. Neurologic:  Alert and oriented x 3. Right arm is weak.  Psych:  Normal affect.  Assessment/Plan:  CHRONIC SYSTOLIC HEART FAILURE  Ischemic cardiomyopathy.  EF 25-30% on 12/14 echo.  This was lower than the most recent prior, but was in the setting of acute DKA which may have affected her LV function. I reviewed today's echo.  This shows EF 40-45% with anteroseptal and apical wall motion abnormalities.  This is more in line with prior echoes.  She is out of range for ICD  placement.  She is off spironolactone as she developed hyperkalemia while taking this medication and enalapril was stopped due to low BP. I will continue her on the current doses of Coreg, digoxin, and Lasix.  I do not think that she will tolerate up-titration of her meds.  She looks euvolemic on current Lasix.  Digoxin level was ok in 2/15.    CAD Known coronary disease with occluded mid-LAD.  She had troponin peaking at 2 during 12/14 admission with DKA.  She did not have chest pain and wall motion abnormality pattern on echo was similar to the past.  Creatinine was elevated.  She had a similar DKA event in 3/12 with very elevated TnI and catheterization showing no changes in her coronary disease.   I elected not to re-cath her in 12/14.  She denies any chest pain or dyspnea currently. Continue ASA 81, Plavix, and Crestor.  CVA  Occurred while on aspirin and Plavix. Some residual right arm weakness. Carotid dopplers showed no significant disease and limited echo to look for LV apical thrombus was unremarkable. 3-week monitor did not show atrial fibrillation.  HYPERLIPIDEMIA  Continue Crestor.  Check lipids today.   Followup in 3 months.     Marca Ancona 07/04/2013 11:23 AM

## 2013-09-19 ENCOUNTER — Other Ambulatory Visit: Payer: Self-pay | Admitting: Cardiology

## 2013-09-21 ENCOUNTER — Ambulatory Visit (INDEPENDENT_AMBULATORY_CARE_PROVIDER_SITE_OTHER): Payer: Medicare Other | Admitting: Ophthalmology

## 2013-09-23 ENCOUNTER — Encounter: Payer: Self-pay | Admitting: Cardiology

## 2013-09-28 ENCOUNTER — Ambulatory Visit (INDEPENDENT_AMBULATORY_CARE_PROVIDER_SITE_OTHER): Payer: Medicare Other | Admitting: Ophthalmology

## 2013-09-28 DIAGNOSIS — H2510 Age-related nuclear cataract, unspecified eye: Secondary | ICD-10-CM

## 2013-09-28 DIAGNOSIS — E11359 Type 2 diabetes mellitus with proliferative diabetic retinopathy without macular edema: Secondary | ICD-10-CM

## 2013-09-28 DIAGNOSIS — E1039 Type 1 diabetes mellitus with other diabetic ophthalmic complication: Secondary | ICD-10-CM

## 2013-09-28 DIAGNOSIS — E1065 Type 1 diabetes mellitus with hyperglycemia: Secondary | ICD-10-CM

## 2013-09-28 DIAGNOSIS — H43819 Vitreous degeneration, unspecified eye: Secondary | ICD-10-CM

## 2013-10-18 ENCOUNTER — Other Ambulatory Visit: Payer: Self-pay | Admitting: Cardiology

## 2013-11-09 ENCOUNTER — Ambulatory Visit (HOSPITAL_COMMUNITY)
Admission: RE | Admit: 2013-11-09 | Discharge: 2013-11-09 | Disposition: A | Discharge status: Home / Self Care | Payer: Medicare Other | Source: Ambulatory Visit | Attending: Cardiology | Admitting: Cardiology

## 2013-11-09 VITALS — BP 92/76 | HR 70 | Wt 167.4 lb

## 2013-11-09 DIAGNOSIS — F3289 Other specified depressive episodes: Secondary | ICD-10-CM | POA: Insufficient documentation

## 2013-11-09 DIAGNOSIS — D509 Iron deficiency anemia, unspecified: Secondary | ICD-10-CM | POA: Diagnosis not present

## 2013-11-09 DIAGNOSIS — Z7901 Long term (current) use of anticoagulants: Secondary | ICD-10-CM | POA: Insufficient documentation

## 2013-11-09 DIAGNOSIS — Z7982 Long term (current) use of aspirin: Secondary | ICD-10-CM | POA: Insufficient documentation

## 2013-11-09 DIAGNOSIS — Z8673 Personal history of transient ischemic attack (TIA), and cerebral infarction without residual deficits: Secondary | ICD-10-CM | POA: Insufficient documentation

## 2013-11-09 DIAGNOSIS — I2589 Other forms of chronic ischemic heart disease: Secondary | ICD-10-CM | POA: Insufficient documentation

## 2013-11-09 DIAGNOSIS — I509 Heart failure, unspecified: Secondary | ICD-10-CM | POA: Insufficient documentation

## 2013-11-09 DIAGNOSIS — E785 Hyperlipidemia, unspecified: Secondary | ICD-10-CM | POA: Diagnosis not present

## 2013-11-09 DIAGNOSIS — F329 Major depressive disorder, single episode, unspecified: Secondary | ICD-10-CM | POA: Insufficient documentation

## 2013-11-09 DIAGNOSIS — E538 Deficiency of other specified B group vitamins: Secondary | ICD-10-CM | POA: Diagnosis not present

## 2013-11-09 DIAGNOSIS — E11319 Type 2 diabetes mellitus with unspecified diabetic retinopathy without macular edema: Secondary | ICD-10-CM | POA: Diagnosis not present

## 2013-11-09 DIAGNOSIS — I5022 Chronic systolic (congestive) heart failure: Secondary | ICD-10-CM | POA: Insufficient documentation

## 2013-11-09 DIAGNOSIS — Z881 Allergy status to other antibiotic agents status: Secondary | ICD-10-CM | POA: Diagnosis not present

## 2013-11-09 DIAGNOSIS — E1039 Type 1 diabetes mellitus with other diabetic ophthalmic complication: Secondary | ICD-10-CM | POA: Insufficient documentation

## 2013-11-09 DIAGNOSIS — I2510 Atherosclerotic heart disease of native coronary artery without angina pectoris: Secondary | ICD-10-CM | POA: Diagnosis not present

## 2013-11-09 DIAGNOSIS — I951 Orthostatic hypotension: Secondary | ICD-10-CM | POA: Diagnosis not present

## 2013-11-09 LAB — BASIC METABOLIC PANEL
ANION GAP: 14 (ref 5–15)
BUN: 20 mg/dL (ref 6–23)
CO2: 26 mEq/L (ref 19–32)
Calcium: 9.4 mg/dL (ref 8.4–10.5)
Chloride: 97 mEq/L (ref 96–112)
Creatinine, Ser: 1.25 mg/dL — ABNORMAL HIGH (ref 0.50–1.10)
GFR calc non Af Amer: 48 mL/min — ABNORMAL LOW (ref >90)
GFR, EST AFRICAN AMERICAN: 55 mL/min — AB (ref >90)
GLUCOSE: 291 mg/dL — AB (ref 70–99)
POTASSIUM: 4.7 meq/L (ref 3.7–5.3)
SODIUM: 137 meq/L (ref 137–147)

## 2013-11-09 LAB — DIGOXIN LEVEL: DIGOXIN LVL: 0.4 ng/mL — AB (ref 0.8–2.0)

## 2013-11-09 NOTE — Patient Instructions (Signed)
Labs today  We will contact you in 4 months to schedule your next appointment.  

## 2013-11-09 NOTE — Progress Notes (Signed)
Patient ID: [name], female   DOB: [birth year], 53 y.o.   MRN: [id number] PCP: Dr. Evlyn [place]  53 yo with history of Type I diabetes, CAD, and ischemic cardiomyopathy presents for cardiology followup.  She had a prolonged admission with DKA and systolic CHF in 12/11.  Cardiac enzymes were found to be elevated and left heart cath showed occluded LAD, which appeared chronic at the time of cath.  Echo showed peri-apical akinesis with EF 30%.  Management was complicated by orthostatic hypotension causing syncope.  This was likely due to combination of low cardiac output and diabetic autonomic neuropathy.  This limited titration of cardiac medications initially. Repeat echo (1/12) showed EF 40-45% (improved).  She was readmitted with DKA in 3/12, and cardiac enzymes were noted to be quite elevated with troponin peaking at 24 and CKMB at 69.  Repeat LHC was done, actually showing no significant difference from the 12/11 study.    In 5/12, she noted weakness one day in her right leg and arm.  She had a head MRI done, showing a small acute left parietal infarction.  This stroke occurred while on ASA and Plavix.  She has improved but her left arm still has some weakness. Carotid dopplers showed no significant carotid stenosis.  I did a limited echo to look for LV thrombus, and this was not seen.  3 week event monitor showed no atrial fibrillation.    Echo in 5/14 showed EF 40%.  We had to cut back on her cardiac meds due to hyperkalemia and hypotension.  She is now off ACEI completely (was hypotensive with even a low dose).  She was admitted again in 12/14 with DKA and elevated troponin (peak 2).  No chest pain.  Echo in 12/14 showed EF down to 25-30% but wall motion abnormality pattern was the same.  Given elevated creatinine, no chest pain, and very elevated troponin at prior admit with DKA without new coronary disease, I did not cath her.  Repeat echo in 3/15 showed EF 40-45% with anteroseptal and apical severe  hypokinesis to akinesis.    Since last visit, she has been stable.  She is not very active and uses her walker for stability but denies exertional dyspnea.  No chest pain.  No orthopnea/PND.  No tachypalpitations.  No falls or lightheadedness.  Labs (12/11): K 3.7, creatinine 0.88, BNP 612, LDL 29, HDL 44 Labs (1/12): digoxin 1.0, BNP 199, K 4.4, creatinine 0.9 Labs (5/12): digoxin 0.4 Labs (6/12): K 4.2, creatinine 0.99 Labs (7/12): K 6.2, creatinine 1.3 (KCl and spironolactone held).  Labs (7/12), repeat: K 4.8, creatinine 1.2, BNP 173 Labs (11/12): K 4.6, creatinine 1.6 => 1.3, TnI 0.7 Labs (12/12): K 4.9, creatinine 1.5, LDL 76, HDL 55, BNP 103, digoxin 1.0 Labs (3/13): K 4.2, creatinine 1.1, digoxin 1.0 Labs (6/13): LDL 57, HDL 51, digoxin 0.3 Labs (40/9810/13): K 4.4, creatinine 1.4 Labs (1/14): BNP 253 => 108, K 4.4, creatinine 1.3 Labs (4/14): K 3.7, creatinine 1.2, LDL 61, HDL 57 Labs (12/14): K 4.2, creatinine 1.79, digoxin 0.7 Labs (2/15): K 4.8, creatinine 1.07, digoxin 0.4, HCT 41.5, LFTs normal Labs (3/15): LDL 64, HDL 52  Allergies (verified):  1)  Reglan 2)  Erythromycin  Past Medical History: 1. Hypertension: However, after MI had orthostatic hypotension.  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 CAD: NSTEMI 12/11.  Cardiac catheterization (12/11) showing LAD totalled in the midportion, diagonal 1 70% stenosed (small 2-mm vessel), circumflex nonobstructive, OM1 40%,  OM2 30%, RCA small and no significant disease.  LAD occlusion was calcified and appeared chronic at the time of catheterization so no intervention.  Patient readmitted with DKA in 3/12, troponin found to be 24 and CKMB 69.  Repeat LHC showed no change from 12/11 study.  5. Ischemic cardiomyopathy: Echo (12/11) with EF 30% and periapical akinesis, no significant MR, RV looked normal.  QRS is not wide on ECG.  Echo (1/12): EF 40-45%, severe hypokinesis of the anteroseptal wall, apical inferior wall,  inferoseptal wall, and apex.  Hyperkalemia with spironolactone and with > 2.5 mg daily enalapril.  Echo 7/12 with EF 40-45%.  Echo (5/14): EF 40%, mid-apical anteroseptal hypokinesis, grade II diastolic dysfunction. Echo (12/14) with EF 25-30%, periapical akinesis. Echo (3/15) with EF 40-45%, anteroseptal and apical severe hypokinesis to akinesis.  6. Orthostatic hypotension: Syncopal episode in 12/11 likely due to this.  Probably from combination of depressed cardiac output and diabetic autonomic neuropathy.  7. Type 1 insulin-dependent diabetes. History of DKA.  8. Hyperlipidemia.  9. Diabetic retinopathy.  10. Gastroparesis.  11. Diabetic nephropathy  12. Depression.  13. 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  Carotid dopplers (7/12) with no significant disease.  Limited echo in 7/12 did not show an LV apical thrombus.  3-week event monitor in 7/12 did not show any runs of atrial fibrillation.   Family History: No FH of Colon Cancer: Family History of Pancreatic Cancer: Father deceased 55 No premature CAD  Social History: Divorced, 1 boy unemployed, lives in [place] Patient has never smoked.  Alcohol Use - no Daily Caffeine Use 1 cup/day Illicit Drug Use - no  ROS: All systems reviewed and negative except as per HPI.    Current Outpatient Prescriptions  Medication Sig Dispense Refill  . ALPRAZolam (XANAX) 0.5 MG tablet Take 0.5 mg by mouth every morning.       [name] aspirin EC 81 MG tablet Take 81 mg by mouth at bedtime.       . Calcium-Magnesium-Vitamin D (CALCIUM 500 PO) Take 500 mg by mouth daily with lunch.      . carvedilol (COREG) 6.25 MG tablet Take 6.25 mg by mouth 2 (two) times daily.      . citalopram (CELEXA) 20 MG tablet Take 40 mg by mouth daily.       . clopidogrel (PLAVIX) 75 MG tablet Take 1 tablet (75 mg total) by mouth daily.  30 tablet  6  . digoxin (LANOXIN) 0.125 MG tablet Take 0.0625 mg by mouth every other day.      . ferrous sulfate 325 (65 FE) MG  tablet Take 650 mg by mouth daily with supper.       . furosemide (LASIX) 20 MG tablet Take 1 tablet (20 mg total) by mouth daily.  30 tablet  8  . insulin lispro (HUMALOG) 100 UNIT/ML injection Inject 2-4 Units into the skin See admin instructions. Sliding scale - patient injects 2-4 units subq as needed depending on meter reading      . Multiple Vitamin (MULTIVITAMIN WITH MINERALS) TABS tablet Take 1 tablet by mouth daily with lunch.      . rosuvastatin (CRESTOR) 40 MG tablet Take 40 mg by mouth daily with supper.   30 tablet  11  . Vitamin D, Ergocalciferol, (DRISDOL) 50000 UNITS CAPS Take 50,000 Units by mouth every 7 (seven) days. Saturday       No current facility-administered medications for this encounter.    BP 92/76  Pulse 70  Wt 167 lb 6.4 oz (75.932 kg)  SpO2 97%  LMP 03/13/2011 General:  Well developed, well nourished, in no acute distress. Neck:  Neck thick, JVP difficult, suspect not elevated. No masses, thyromegaly or abnormal cervical nodes. Lungs:  Crackles right base Heart:  Non-displaced PMI, chest non-tender; regular rate and rhythm, S1, S2 without murmurs, rubs or gallops. Carotid upstroke normal, no bruit. Pedals normal pulses. No edema.   Abdomen:  Bowel sounds positive; abdomen soft and non-tender without masses, organomegaly, or hernias noted. No hepatosplenomegaly. Extremities:  No clubbing or cyanosis. Neurologic:  Alert and oriented x 3. Right arm is weak.  Psych:  Normal affect.  Assessment/Plan:  CHRONIC SYSTOLIC HEART FAILURE  Ischemic cardiomyopathy.  EF 25-30% on 12/14 echo.  This was lower than the prior, but was in the setting of acute DKA which may have affected her LV function. Followup echo in 3/15 showed EF 40-45% with anteroseptal and apical wall motion abnormalities.  This is more in line with prior echoes.  She is out of range for ICD placement.  She is off spironolactone as she developed hyperkalemia while taking this medication and enalapril was  stopped due to low BP. I will continue her on the current doses of Coreg, digoxin, and Lasix.  I do not think that she will tolerate up-titration of her meds.  She looks euvolemic on current Lasix.  Check BMET and digoxin level today.   CAD Known coronary disease with occluded mid-LAD.  She had troponin peaking at 2 during 12/14 admission with DKA.  She did not have chest pain and wall motion abnormality pattern on echo was similar to the past.  Creatinine was elevated.  She had a similar DKA event in 3/12 with very elevated TnI and catheterization showing no changes in her coronary disease.   I elected not to re-cath her in 12/14.  She denies any chest pain or dyspnea currently. Continue ASA 81, Plavix, and Crestor.  CVA  Occurred while on aspirin and Plavix. Some residual right arm weakness. Carotid dopplers showed no significant disease and limited echo to look for LV apical thrombus was unremarkable. 3-week monitor did not show atrial fibrillation.  HYPERLIPIDEMIA  Continue Crestor.  Recent lipids looked good.    Followup in 4 months.     Marca Ancona 11/09/2013

## 2014-02-03 ENCOUNTER — Ambulatory Visit (INDEPENDENT_AMBULATORY_CARE_PROVIDER_SITE_OTHER): Payer: Medicare Other

## 2014-02-03 DIAGNOSIS — Z23 Encounter for immunization: Secondary | ICD-10-CM

## 2014-03-16 ENCOUNTER — Ambulatory Visit (HOSPITAL_COMMUNITY)
Admission: RE | Admit: 2014-03-16 | Discharge: 2014-03-16 | Disposition: A | Discharge status: Home / Self Care | Payer: Medicare Other | Source: Ambulatory Visit | Attending: Cardiology | Admitting: Cardiology

## 2014-03-16 ENCOUNTER — Encounter (HOSPITAL_COMMUNITY): Payer: Self-pay

## 2014-03-16 VITALS — BP 82/56 | HR 77

## 2014-03-16 DIAGNOSIS — E785 Hyperlipidemia, unspecified: Secondary | ICD-10-CM

## 2014-03-16 DIAGNOSIS — I2510 Atherosclerotic heart disease of native coronary artery without angina pectoris: Secondary | ICD-10-CM | POA: Diagnosis not present

## 2014-03-16 DIAGNOSIS — I5022 Chronic systolic (congestive) heart failure: Secondary | ICD-10-CM | POA: Diagnosis present

## 2014-03-16 DIAGNOSIS — I639 Cerebral infarction, unspecified: Secondary | ICD-10-CM | POA: Insufficient documentation

## 2014-03-16 LAB — CBC
HEMATOCRIT: 45.7 % (ref 36.0–46.0)
Hemoglobin: 14.8 g/dL (ref 12.0–15.0)
MCH: 28.4 pg (ref 26.0–34.0)
MCHC: 32.4 g/dL (ref 30.0–36.0)
MCV: 87.7 fL (ref 78.0–100.0)
PLATELETS: 253 10*3/uL (ref 150–400)
RBC: 5.21 MIL/uL — ABNORMAL HIGH (ref 3.87–5.11)
RDW: 18.1 % — AB (ref 11.5–15.5)
WBC: 6.9 10*3/uL (ref 4.0–10.5)

## 2014-03-16 LAB — BASIC METABOLIC PANEL
ANION GAP: 14 (ref 5–15)
BUN: 17 mg/dL (ref 6–23)
CO2: 26 mEq/L (ref 19–32)
Calcium: 9.8 mg/dL (ref 8.4–10.5)
Chloride: 103 mEq/L (ref 96–112)
Creatinine, Ser: 1.11 mg/dL — ABNORMAL HIGH (ref 0.50–1.10)
GFR, EST AFRICAN AMERICAN: 64 mL/min — AB (ref >90)
GFR, EST NON AFRICAN AMERICAN: 55 mL/min — AB (ref >90)
Glucose, Bld: 125 mg/dL — ABNORMAL HIGH (ref 70–99)
POTASSIUM: 4.5 meq/L (ref 3.7–5.3)
Sodium: 143 mEq/L (ref 137–147)

## 2014-03-16 LAB — DIGOXIN LEVEL: Digoxin Level: 0.5 ng/mL — ABNORMAL LOW (ref 0.8–2.0)

## 2014-03-16 NOTE — Progress Notes (Signed)
Patient ID: [name], female   DOB: 30-Aug-1960, 53 y.o.   MRN: [id number] PCP: Dr. Evlyn KannerSouth  53 yo with history of Type I diabetes, CAD, and ischemic cardiomyopathy presents for cardiology followup.  She had a prolonged admission with DKA and systolic CHF in 12/11.  Cardiac enzymes were found to be elevated and left heart cath showed occluded LAD, which appeared chronic at the time of cath.  Echo showed peri-apical akinesis with EF 30%.  Management was complicated by orthostatic hypotension causing syncope.  This was likely due to combination of low cardiac output and diabetic autonomic neuropathy.  This limited titration of cardiac medications initially. Repeat echo (1/12) showed EF 40-45% (improved).  She was readmitted with DKA in 3/12, and cardiac enzymes were noted to be quite elevated with troponin peaking at 24 and CKMB at 69.  Repeat LHC was done, actually showing no significant difference from the 12/11 study.    In 5/12, she noted weakness one day in her right leg and arm.  She had a head MRI done, showing a small acute left parietal infarction.  This stroke occurred while on ASA and Plavix.  She has improved but her left arm still has some weakness. Carotid dopplers showed no significant carotid stenosis.  I did a limited echo to look for LV thrombus, and this was not seen.  3 week event monitor showed no atrial fibrillation.    Echo in 5/14 showed EF 40%.  We had to cut back on her cardiac meds due to hyperkalemia and hypotension.  She is now off ACEI completely (was hypotensive with even a low dose).  She was admitted again in 12/14 with DKA and elevated troponin (peak 2).  No chest pain.  Echo in 12/14 showed EF down to 25-30% but wall motion abnormality pattern was the same.  Given elevated creatinine, no chest pain, and very elevated troponin at prior admit with DKA without new coronary disease, I did not cath her.  Repeat echo in 3/15 showed EF 40-45% with anteroseptal and apical severe  hypokinesis to akinesis.    Since last visit, she has been stable.  She is not very active and uses her walker for stability but denies exertional dyspnea.  No chest pain.  No orthopnea/PND.  No tachypalpitations.  No falls or lightheadedness though BP is low today. She does get out some to go to church.    Labs (12/11): K 3.7, creatinine 0.88, BNP 612, LDL 29, HDL 44 Labs (1/12): digoxin 1.0, BNP 199, K 4.4, creatinine 0.9 Labs (5/12): digoxin 0.4 Labs (6/12): K 4.2, creatinine 0.99 Labs (7/12): K 6.2, creatinine 1.3 (KCl and spironolactone held).  Labs (7/12), repeat: K 4.8, creatinine 1.2, BNP 173 Labs (11/12): K 4.6, creatinine 1.6 => 1.3, TnI 0.7 Labs (12/12): K 4.9, creatinine 1.5, LDL 76, HDL 55, BNP 103, digoxin 1.0 Labs (3/13): K 4.2, creatinine 1.1, digoxin 1.0 Labs (6/13): LDL 57, HDL 51, digoxin 0.3 Labs (78/2910/13): K 4.4, creatinine 1.4 Labs (1/14): BNP 253 => 108, K 4.4, creatinine 1.3 Labs (4/14): K 3.7, creatinine 1.2, LDL 61, HDL 57 Labs (12/14): K 4.2, creatinine 1.79, digoxin 0.7 Labs (2/15): K 4.8, creatinine 1.07, digoxin 0.4, HCT 41.5, LFTs normal Labs (3/15): LDL 64, HDL 52 Labs (7/15): K 4.7, creatinine 1.25, digoxin 0.4  Allergies (verified):  1)  Reglan 2)  Erythromycin  Past Medical History: 1. Hypertension: However, after MI had orthostatic hypotension.  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  CAD: NSTEMI 12/11.  Cardiac catheterization (12/11) showing LAD totalled in the midportion, diagonal 1 70% stenosed (small 2-mm vessel), circumflex nonobstructive, OM1 40%, OM2 30%, RCA small and no significant disease.  LAD occlusion was calcified and appeared chronic at the time of catheterization so no intervention.  Patient readmitted with DKA in 3/12, troponin found to be 24 and CKMB 69.  Repeat LHC showed no change from 12/11 study.  5. Ischemic cardiomyopathy: Echo (12/11) with EF 30% and periapical akinesis, no significant MR, RV looked normal.  QRS is  not wide on ECG.  Echo (1/12): EF 40-45%, severe hypokinesis of the anteroseptal wall, apical inferior wall, inferoseptal wall, and apex.  Hyperkalemia with spironolactone and with > 2.5 mg daily enalapril.  Echo 7/12 with EF 40-45%.  Echo (5/14): EF 40%, mid-apical anteroseptal hypokinesis, grade II diastolic dysfunction. Echo (12/14) with EF 25-30%, periapical akinesis. Echo (3/15) with EF 40-45%, anteroseptal and apical severe hypokinesis to akinesis.  6. Orthostatic hypotension: Syncopal episode in 12/11 likely due to this.  Probably from combination of depressed cardiac output and diabetic autonomic neuropathy.  7. Type 1 insulin-dependent diabetes. History of DKA.  8. Hyperlipidemia.  9. Diabetic retinopathy.  10. Gastroparesis.  11. Diabetic nephropathy  12. Depression.  13. 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  Carotid dopplers (7/12) with no significant disease.  Limited echo in 7/12 did not show an LV apical thrombus.  3-week event monitor in 7/12 did not show any runs of atrial fibrillation.   Family History: No FH of Colon Cancer: Family History of Pancreatic Cancer: Father deceased 80 No premature CAD  Social History: Divorced, 1 boy unemployed, lives in [place] Patient has never smoked.  Alcohol Use - no Daily Caffeine Use 1 cup/day Illicit Drug Use - no  ROS: All systems reviewed and negative except as per HPI.    Current Outpatient Prescriptions  Medication Sig Dispense Refill  . aspirin EC 81 MG tablet Take 81 mg by mouth at bedtime.     . Calcium-Magnesium-Vitamin D (CALCIUM 500 PO) Take 500 mg by mouth daily with lunch.    . carvedilol (COREG) 6.25 MG tablet Take 6.25 mg by mouth 2 (two) times daily.    . citalopram (CELEXA) 20 MG tablet Take 40 mg by mouth daily.     . clopidogrel (PLAVIX) 75 MG tablet Take 1 tablet (75 mg total) by mouth daily. 30 tablet 6  . digoxin (LANOXIN) 0.125 MG tablet Take 0.0625 mg by mouth every other day.    . ferrous sulfate  325 (65 FE) MG tablet Take 650 mg by mouth daily with supper.     . furosemide (LASIX) 20 MG tablet Take 1 tablet (20 mg total) by mouth daily. 30 tablet 8  . insulin glargine (LANTUS) 100 UNIT/ML injection Inject 18 Units into the skin at bedtime.    . insulin lispro (HUMALOG) 100 UNIT/ML injection Inject 2-4 Units into the skin See admin instructions. Sliding scale - patient injects 2-4 units subq as needed depending on meter reading    . Multiple Vitamin (MULTIVITAMIN WITH MINERALS) TABS tablet Take 1 tablet by mouth daily with lunch.    . rosuvastatin (CRESTOR) 40 MG tablet Take 40 mg by mouth daily with supper.  30 tablet 11  . Vitamin D, Ergocalciferol, (DRISDOL) 50000 UNITS CAPS Take 50,000 Units by mouth every 7 (seven) days. Saturday    . ALPRAZolam (XANAX) 0.5 MG tablet Take 0.5 mg by mouth every morning.  No current facility-administered medications for this encounter.    BP 82/56 mmHg  Pulse 77  SpO2 98%  LMP 03/13/2011 General:  Well developed, well nourished, in no acute distress. Neck:  Neck thick, JVP difficult, suspect not elevated. No masses, thyromegaly or abnormal cervical nodes. Lungs:  Crackles right base Heart:  Non-displaced PMI, chest non-tender; regular rate and rhythm, S1, S2 without murmurs, rubs or gallops. Carotid upstroke normal, no bruit. Pedals normal pulses. No edema.   Abdomen:  Bowel sounds positive; abdomen soft and non-tender without masses, organomegaly, or hernias noted. No hepatosplenomegaly. Extremities:  No clubbing or cyanosis. Neurologic:  Alert and oriented x 3. Right arm is weak.  Psych:  Normal affect.  Assessment/Plan:  CHRONIC SYSTOLIC HEART FAILURE  Ischemic cardiomyopathy.  EF 25-30% on 12/14 echo.  This was lower than the prior, but was in the setting of acute DKA which may have affected her LV function. Followup echo in 3/15 showed EF 40-45% with anteroseptal and apical wall motion abnormalities.  This is more in line with prior  echoes.  She is out of range for ICD placement.  She is off spironolactone as she developed hyperkalemia while taking this medication and enalapril was stopped due to low BP. I will continue her on the current doses of Coreg, digoxin, and Lasix.  I do not think that she will tolerate up-titration of her meds.  BP is low today but she denies lightheadedness.  She looks euvolemic on current Lasix.  Check BMET and digoxin level today.   CAD Known coronary disease with occluded mid-LAD.  She had troponin peaking at 2 during 12/14 admission with DKA.  She did not have chest pain and wall motion abnormality pattern on echo was similar to the past.  Creatinine was elevated.  She had a similar DKA event in 3/12 with very elevated TnI and catheterization showing no changes in her coronary disease.   I elected not to re-cath her in 12/14.  She denies any chest pain or dyspnea currently. Continue ASA 81, Plavix, and Crestor.  Check CBC given Plavix and ASA use.  CVA  Occurred while on aspirin and Plavix. Some residual right arm weakness. Carotid dopplers showed no significant disease and limited echo to look for LV apical thrombus was unremarkable. 3-week monitor did not show atrial fibrillation.  HYPERLIPIDEMIA  Continue Crestor.  Last lipids looked good.   Followup in 6 months.     Marca AnconaDalton Aaronmichael Brumbaugh 03/16/2014

## 2014-03-16 NOTE — Patient Instructions (Signed)
Labs today  We will contact you in 6 months to schedule your next appointment.  

## 2014-04-01 ENCOUNTER — Ambulatory Visit (INDEPENDENT_AMBULATORY_CARE_PROVIDER_SITE_OTHER): Payer: Medicare Other | Admitting: Ophthalmology

## 2014-04-01 DIAGNOSIS — E10319 Type 1 diabetes mellitus with unspecified diabetic retinopathy without macular edema: Secondary | ICD-10-CM

## 2014-04-01 DIAGNOSIS — H26491 Other secondary cataract, right eye: Secondary | ICD-10-CM

## 2014-04-01 DIAGNOSIS — E10359 Type 1 diabetes mellitus with proliferative diabetic retinopathy without macular edema: Secondary | ICD-10-CM

## 2014-04-01 DIAGNOSIS — H2512 Age-related nuclear cataract, left eye: Secondary | ICD-10-CM

## 2014-04-01 DIAGNOSIS — H43813 Vitreous degeneration, bilateral: Secondary | ICD-10-CM

## 2014-04-29 ENCOUNTER — Other Ambulatory Visit (INDEPENDENT_AMBULATORY_CARE_PROVIDER_SITE_OTHER): Payer: Medicare Other | Admitting: Ophthalmology

## 2014-04-29 DIAGNOSIS — E11351 Type 2 diabetes mellitus with proliferative diabetic retinopathy with macular edema: Secondary | ICD-10-CM

## 2014-04-29 DIAGNOSIS — E10311 Type 1 diabetes mellitus with unspecified diabetic retinopathy with macular edema: Secondary | ICD-10-CM

## 2014-04-29 DIAGNOSIS — H2701 Aphakia, right eye: Secondary | ICD-10-CM

## 2014-05-03 LAB — IFOBT (OCCULT BLOOD): IFOBT: POSITIVE

## 2014-05-04 ENCOUNTER — Ambulatory Visit (INDEPENDENT_AMBULATORY_CARE_PROVIDER_SITE_OTHER): Payer: Medicare Other | Admitting: Gastroenterology

## 2014-05-04 ENCOUNTER — Encounter: Payer: Self-pay | Admitting: Gastroenterology

## 2014-05-04 ENCOUNTER — Other Ambulatory Visit (INDEPENDENT_AMBULATORY_CARE_PROVIDER_SITE_OTHER): Payer: Medicare Other

## 2014-05-04 ENCOUNTER — Ambulatory Visit: Payer: Self-pay | Admitting: Internal Medicine

## 2014-05-04 VITALS — BP 100/60 | HR 60 | Ht 64.0 in | Wt 166.0 lb

## 2014-05-04 DIAGNOSIS — R195 Other fecal abnormalities: Secondary | ICD-10-CM

## 2014-05-04 LAB — IBC PANEL
Iron: 100 ug/dL (ref 42–145)
Saturation Ratios: 29.5 % (ref 20.0–50.0)
Transferrin: 241.9 mg/dL (ref 212.0–360.0)

## 2014-05-04 LAB — HEMOGLOBIN: HEMOGLOBIN: 14.7 g/dL (ref 12.0–15.0)

## 2014-05-04 LAB — FERRITIN: FERRITIN: 26.4 ng/mL (ref 10.0–291.0)

## 2014-05-04 NOTE — Patient Instructions (Signed)
Your physician has requested that you go to the basement for the following lab work before leaving today:Hemoglobin, Ferritin, and IBC panel.  Follow the instructions on the Hemoccult cards and mail them back to us when you are [place] or you may take them directly to the lab in the basement of the [facility]. We will call you with the results.   cc: Adrian PrinceStephen South, MD

## 2014-05-04 NOTE — Progress Notes (Signed)
Reviewed and agree.

## 2014-05-04 NOTE — Progress Notes (Signed)
     05/04/2014 [name] [id number] November 25, 1960   History of Present Illness:  This is a pleasant 54 year old female who is previously known to Dr. Juanda ChanceBrodie for EGD and colonoscopy in 08/2008.  At that time the studies were performed for iron deficiency anemia. Colonoscopy was normal and EGD showed some esophagitis in the distal esophagus.GE junction biopsy showed benign mildly inflamed GE junction mucosa and duodenal biopsies were normal/negative for celiac disease.  She presents to our office today with her mother at the request of her PCP, Dr. Evlyn KannerSouth, for evaluation of her recent positive Hemosure test that was performed as routine screening. This study was performed last week. Her last hemoglobin was in November and at that time was normal at 14.8 g with MCV of 87.7. She is on iron supplements and has been on those since at least 2012. The patient and her mother report that her stools are very dark in color, but have been that way since taking iron. They deny seeing any blood or any other GI issues.  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  She is on Plavix.    Current Medications, Allergies, Past Medical History, Past Surgical History, Family History and Social History were reviewed in [facility] electronic medical record.   Physical Exam: BP 100/60 mmHg  Pulse 60  Ht 5\' 4"  (1.626 m)  Wt 166 lb (75.297 kg)  BMI 28.48 kg/m2  LMP 03/13/2011 General: Well developed white female in no acute distress; appears older than stated age. Head: Normocephalic and atraumatic Eyes:  Sclerae anicteric, conjunctiva pink  Ears: Normal auditory acuity Lungs: Clear throughout to  auscultation Heart: Regular rate and rhythm Abdomen: Soft, non-distended.  Normal bowel sounds.  Non-tender. Rectal:  Dark green stool noted on exam glove; heme negative.  Musculoskeletal: Symmetrical with no gross deformities  Extremities: No edema  Neurological: Alert oriented x 4, grossly non-focal Psychological:  Alert and cooperative. Normal mood and affect  Assessment and Recommendations: -Heme positive stools:  One positive Hemosure study.  Hgb normal just 2 months ago.  She has been on iron supplements for several years.  Denies any bleeding or GI complaints.  Negative colonoscopy in 08/2008 and EGD with esophagitis.  Patient has multiple co-morbidities and is at increased risk for sedation and procedures.  I have discussed with Dr. Juanda ChanceBrodie and the plan is to repeat Hgb today along with iron studies.  Will send her home with three hemoccult cards.  If negative and labs remain normal/stable then will just observe/monitor for now.

## 2014-05-14 ENCOUNTER — Other Ambulatory Visit: Payer: Self-pay | Admitting: Cardiology

## 2014-05-19 ENCOUNTER — Other Ambulatory Visit (INDEPENDENT_AMBULATORY_CARE_PROVIDER_SITE_OTHER): Payer: Medicare Other

## 2014-05-19 DIAGNOSIS — R195 Other fecal abnormalities: Secondary | ICD-10-CM

## 2014-05-19 LAB — HEMOCCULT SLIDES (X 3 CARDS)
Fecal Occult Blood: NEGATIVE
OCCULT 1: NEGATIVE
OCCULT 2: NEGATIVE
OCCULT 3: NEGATIVE
OCCULT 4: NEGATIVE
OCCULT 5: NEGATIVE

## 2014-05-22 ENCOUNTER — Other Ambulatory Visit: Payer: Self-pay | Admitting: Cardiology

## 2014-09-01 ENCOUNTER — Ambulatory Visit (INDEPENDENT_AMBULATORY_CARE_PROVIDER_SITE_OTHER): Payer: Medicare Other | Admitting: Ophthalmology

## 2014-09-15 ENCOUNTER — Emergency Department (HOSPITAL_COMMUNITY): Payer: Medicare Other

## 2014-09-15 ENCOUNTER — Emergency Department (HOSPITAL_COMMUNITY)
Admission: EM | Admit: 2014-09-15 | Discharge: 2014-09-15 | Disposition: A | Discharge status: Home / Self Care | Payer: Medicare Other | Attending: Emergency Medicine | Admitting: Emergency Medicine

## 2014-09-15 ENCOUNTER — Encounter (HOSPITAL_COMMUNITY): Payer: Self-pay | Admitting: *Deleted

## 2014-09-15 DIAGNOSIS — F329 Major depressive disorder, single episode, unspecified: Secondary | ICD-10-CM | POA: Diagnosis not present

## 2014-09-15 DIAGNOSIS — E11319 Type 2 diabetes mellitus with unspecified diabetic retinopathy without macular edema: Secondary | ICD-10-CM | POA: Diagnosis not present

## 2014-09-15 DIAGNOSIS — I2510 Atherosclerotic heart disease of native coronary artery without angina pectoris: Secondary | ICD-10-CM | POA: Insufficient documentation

## 2014-09-15 DIAGNOSIS — D649 Anemia, unspecified: Secondary | ICD-10-CM | POA: Diagnosis not present

## 2014-09-15 DIAGNOSIS — Z8719 Personal history of other diseases of the digestive system: Secondary | ICD-10-CM | POA: Diagnosis not present

## 2014-09-15 DIAGNOSIS — I10 Essential (primary) hypertension: Secondary | ICD-10-CM | POA: Insufficient documentation

## 2014-09-15 DIAGNOSIS — E1165 Type 2 diabetes mellitus with hyperglycemia: Secondary | ICD-10-CM | POA: Diagnosis present

## 2014-09-15 DIAGNOSIS — E1140 Type 2 diabetes mellitus with diabetic neuropathy, unspecified: Secondary | ICD-10-CM | POA: Diagnosis not present

## 2014-09-15 DIAGNOSIS — E785 Hyperlipidemia, unspecified: Secondary | ICD-10-CM | POA: Diagnosis not present

## 2014-09-15 DIAGNOSIS — I5020 Unspecified systolic (congestive) heart failure: Secondary | ICD-10-CM | POA: Insufficient documentation

## 2014-09-15 DIAGNOSIS — E860 Dehydration: Secondary | ICD-10-CM | POA: Diagnosis not present

## 2014-09-15 DIAGNOSIS — Z8673 Personal history of transient ischemic attack (TIA), and cerebral infarction without residual deficits: Secondary | ICD-10-CM | POA: Diagnosis not present

## 2014-09-15 DIAGNOSIS — R739 Hyperglycemia, unspecified: Secondary | ICD-10-CM

## 2014-09-15 DIAGNOSIS — I252 Old myocardial infarction: Secondary | ICD-10-CM | POA: Insufficient documentation

## 2014-09-15 DIAGNOSIS — Z8739 Personal history of other diseases of the musculoskeletal system and connective tissue: Secondary | ICD-10-CM | POA: Insufficient documentation

## 2014-09-15 DIAGNOSIS — Z794 Long term (current) use of insulin: Secondary | ICD-10-CM | POA: Diagnosis not present

## 2014-09-15 DIAGNOSIS — E559 Vitamin D deficiency, unspecified: Secondary | ICD-10-CM | POA: Diagnosis not present

## 2014-09-15 DIAGNOSIS — Z8614 Personal history of Methicillin resistant Staphylococcus aureus infection: Secondary | ICD-10-CM | POA: Insufficient documentation

## 2014-09-15 DIAGNOSIS — Z79899 Other long term (current) drug therapy: Secondary | ICD-10-CM | POA: Insufficient documentation

## 2014-09-15 LAB — CBG MONITORING, ED
Glucose-Capillary: 563 mg/dL (ref 65–99)
Glucose-Capillary: 373 mg/dL — ABNORMAL HIGH (ref 65–99)

## 2014-09-15 LAB — URINALYSIS, ROUTINE W REFLEX MICROSCOPIC
BILIRUBIN URINE: NEGATIVE
Glucose, UA: >1000 mg/dL — AB
Ketones, ur: 15 mg/dL — AB
LEUKOCYTES UA: NEGATIVE
Nitrite: NEGATIVE
PH: 5 (ref 5.0–8.0)
Protein, ur: NEGATIVE mg/dL
Specific Gravity, Urine: 1.024 (ref 1.005–1.030)
Urobilinogen, UA: 1 mg/dL (ref 0.0–1.0)

## 2014-09-15 LAB — COMPREHENSIVE METABOLIC PANEL
ALT: 18 U/L (ref 14–54)
AST: 16 U/L (ref 15–41)
Albumin: 4 g/dL (ref 3.5–5.0)
Alkaline Phosphatase: 60 U/L (ref 38–126)
Anion gap: 15 (ref 5–15)
BUN: 30 mg/dL — ABNORMAL HIGH (ref 6–20)
CALCIUM: 9.3 mg/dL (ref 8.9–10.3)
CHLORIDE: 94 mmol/L — AB (ref 101–111)
CO2: 20 mmol/L — ABNORMAL LOW (ref 22–32)
Creatinine, Ser: 1.92 mg/dL — ABNORMAL HIGH (ref 0.44–1.00)
GFR calc non Af Amer: 28 mL/min — ABNORMAL LOW (ref >60)
GFR, EST AFRICAN AMERICAN: 33 mL/min — AB (ref >60)
GLUCOSE: 786 mg/dL — AB (ref 65–99)
Potassium: 4.8 mmol/L (ref 3.5–5.1)
SODIUM: 129 mmol/L — AB (ref 135–145)
TOTAL PROTEIN: 7.7 g/dL (ref 6.5–8.1)
Total Bilirubin: 1.1 mg/dL (ref 0.3–1.2)

## 2014-09-15 LAB — URINE MICROSCOPIC-ADD ON

## 2014-09-15 LAB — CBC
HEMATOCRIT: 43.3 % (ref 36.0–46.0)
Hemoglobin: 14.5 g/dL (ref 12.0–15.0)
MCH: 30.5 pg (ref 26.0–34.0)
MCHC: 33.5 g/dL (ref 30.0–36.0)
MCV: 91 fL (ref 78.0–100.0)
PLATELETS: 195 10*3/uL (ref 150–400)
RBC: 4.76 MIL/uL (ref 3.87–5.11)
RDW: 12.6 % (ref 11.5–15.5)
WBC: 7.3 10*3/uL (ref 4.0–10.5)

## 2014-09-15 LAB — I-STAT TROPONIN, ED: TROPONIN I, POC: 0 ng/mL (ref 0.00–0.08)

## 2014-09-15 MED ORDER — SODIUM CHLORIDE 0.9 % IV BOLUS (SEPSIS)
1000.0000 mL | Freq: Once | INTRAVENOUS | Status: AC
Start: 2014-09-15 — End: 2014-09-15
  Administered 2014-09-15: 1000 mL via INTRAVENOUS

## 2014-09-15 MED ORDER — TERCONAZOLE 0.4 % VA CREA: 1.0000 | TOPICAL_CREAM | Freq: Every day | VAGINAL | Status: DC

## 2014-09-15 MED ORDER — INSULIN ASPART 100 UNIT/ML ~~LOC~~ SOLN
5.0000 [IU] | Freq: Once | SUBCUTANEOUS | Status: AC
  Administered 2014-09-15: 5 [IU] via SUBCUTANEOUS
  Filled 2014-09-15: qty 1

## 2014-09-15 MED ORDER — INSULIN ASPART 100 UNIT/ML ~~LOC~~ SOLN
10.0000 [IU] | Freq: Once | SUBCUTANEOUS | Status: AC
  Administered 2014-09-15: 10 [IU] via INTRAVENOUS
  Filled 2014-09-15: qty 1

## 2014-09-15 MED ORDER — PROMETHAZINE HCL 25 MG/ML IJ SOLN
25.0000 mg | Freq: Once | INTRAMUSCULAR | Status: DC
  Filled 2014-09-15: qty 1

## 2014-09-15 NOTE — ED Notes (Signed)
thye pts bp has been high for 2 days.  No pain anywhere she feels ok otherwise.  Hx stroke and diabetes

## 2014-09-15 NOTE — ED Provider Notes (Signed)
CSN: [id number]     Arrival date & time 09/15/14  1534 History   First MD Initiated Contact with Patient 09/15/14 1806     Chief Complaint  Patient presents with  . Hyperglycemia     (Consider location/radiation/quality/duration/timing/severity/associated sxs/prior Treatment) HPI  [name] is a 54 y.o. female presenting with hyperglycemia the past 2 days. She denies any symptoms. Patient states 2 weeks ago she started Cymbalta and was switched from Lantus to tresiba. No change in humalog. No other medication changes. Patient denies fevers, chills, nausea, vomiting, abdominal pain. No cough or congestion. No urinary symptoms. Last BM 1-2 days ago and normal. No chest pain or shortness of breath. No peripheral edema. No headache, visual changes, weakness.    Past Medical History  Diagnosis Date  . Hypertension   . Vitamin D deficiency   . Coronary artery disease     a. (12/11) NSTEMI, in the setting of DKA, s/p Cath--LAD totalled in the midportion, diag 70% sten (small 2-mm ), LCx nonobst, OM1 40%, OM2 30%, RCA- no dz. chronic calcified LAD --> no intervention.  b. (06/2010) NSTEMI s/p cath showing same findings   . Ischemic cardiomyopathy   . Orthostatic hypotension     a. sycopal episode in 12/11   . Diabetes mellitus     a. Insulin dependant Type I   . Hyperlipidemia   . Diabetic nephropathy   . Diabetic retinopathy   . Gastroparesis   . Depression   . Systolic heart failure     a. (08/2012) EF: 40% Mod hypokin in mid-dist-ant-sept myocard--> psuedonml L vent fill pattern, Grd 2 diast dyscfxn  . Iron deficiency anemia   . Hx MRSA infection   . Osteopenia   . Esophagitis   . NSTEMI (non-ST elevated myocardial infarction)     a. (03/2010) NSTEMI during admission for DKA  b.  . Stroke     a. (2012) residual R hand/arm weakness  b. while on ASA and plavix  . Diabetic neuropathy    Past Surgical History  Procedure Laterality Date  . Appendectomy  1980  . Tubal ligation  1987   . Cesarean section  1984  . Refractive surgery      x3   Family History  Problem Relation Age of Onset  . Pancreatic cancer Father   . Colon cancer Neg Hx   . Diabetes Maternal Grandfather    History  Substance Use Topics  . Smoking status: Never Smoker   . Smokeless tobacco: Never Used  . Alcohol Use: No   OB History    No data available     Review of Systems 10 Systems reviewed and are negative for acute change except as noted in the HPI.    Allergies  Erythromycin; Food; and Metoclopramide hcl  Home Medications   Prior to Admission medications   Medication Sig Start Date End Date Taking? Authorizing Provider  aspirin EC 81 MG tablet Take 81 mg by mouth at bedtime.  10/17/10  Yes Laurey Morale, MD  Calcium-Magnesium-Vitamin D (CALCIUM 500 PO) Take 500 mg by mouth daily with lunch.   Yes Historical Provider, MD  carvedilol (COREG) 6.25 MG tablet Take 6.25 mg by mouth 2 (two) times daily. 03/26/13  Yes Adrian Prince, MD  citalopram (CELEXA) 20 MG tablet Take 20 mg by mouth daily.  03/21/13  Yes Historical Provider, MD  clopidogrel (PLAVIX) 75 MG tablet Take 1 tablet (75 mg total) by mouth daily. 09/08/12  Yes Eliot Ford  Shirlee LatchMcLean, MD  digoxin (LANOXIN) 0.125 MG tablet Take 0.0625 mg by mouth every other day.   Yes Historical Provider, MD  DULoxetine (CYMBALTA) 30 MG capsule Take 30 mg by mouth at bedtime.   Yes Historical Provider, MD  ferrous sulfate 325 (65 FE) MG tablet Take 650 mg by mouth daily with supper.    Yes Historical Provider, MD  furosemide (LASIX) 20 MG tablet TAKE 1 TABLET (20 MG TOTAL) BY MOUTH DAILY. 05/17/14  Yes Dolores Pattyaniel R Bensimhon, MD  Insulin Degludec 100 UNIT/ML SOPN Inject 24 Units into the skin daily.   Yes Historical Provider, MD  insulin lispro (HUMALOG) 100 UNIT/ML injection Inject 2-4 Units into the skin See admin instructions. Sliding scale - patient injects 2-4 units subq as needed depending on meter reading   Yes Historical Provider, MD  LORazepam  (ATIVAN) 0.5 MG tablet Take 0.5-1 mg by mouth daily.   Yes Historical Provider, MD  Multiple Vitamin (MULTIVITAMIN WITH MINERALS) TABS tablet Take 1 tablet by mouth daily with lunch.   Yes Historical Provider, MD  rosuvastatin (CRESTOR) 40 MG tablet Take 40 mg by mouth daily with supper.  08/17/10  Yes Laurey Moralealton S McLean, MD  terconazole (TERAZOL 7) 0.4 % vaginal cream Place 1 applicator vaginally at bedtime. 09/15/14   Oswaldo ConroyVictoria Anessia [place], PA-C  Vitamin D, Ergocalciferol, (DRISDOL) 50000 UNITS CAPS Take 50,000 Units by mouth every 7 (seven) days. Saturday    Historical Provider, MD   BP 130/64 mmHg  Pulse 72  Temp(Src) 98.3 F (36.8 C)  Resp 16  Ht 5\' 4"  (1.626 m)  Wt 160 lb (72.576 kg)  BMI 27.45 kg/m2  SpO2 99%  LMP 03/13/2011 Physical Exam  Constitutional: She appears well-developed and well-nourished. No distress.  HENT:  Head: Normocephalic and atraumatic.  Dry mucous membranes  Eyes: Conjunctivae and EOM are normal. Right eye exhibits no discharge. Left eye exhibits no discharge.  Neck: No JVD present.  Cardiovascular: Normal rate and regular rhythm.   No leg swelling or tenderness.   Pulmonary/Chest: Effort normal and breath sounds normal. No respiratory distress. She has no wheezes.  Abdominal: Soft. She exhibits no distension. There is no tenderness.  Hypoactive bowel sounds. nontender abdomen  Neurological: She is alert. She exhibits normal muscle tone. Coordination normal.  Skin: Skin is warm and dry. She is not diaphoretic.  Nursing note and vitals reviewed.   ED Course  Procedures (including critical care time) Labs Review Labs Reviewed  COMPREHENSIVE METABOLIC PANEL - Abnormal; Notable for the following:    Sodium 129 (*)    Chloride 94 (*)    CO2 20 (*)    Glucose, Bld 786 (*)    BUN 30 (*)    Creatinine, Ser 1.92 (*)    GFR calc non Af Amer 28 (*)    GFR calc Af Amer 33 (*)    All other components within normal limits  URINALYSIS, ROUTINE W REFLEX MICROSCOPIC -  Abnormal; Notable for the following:    Glucose, UA >1000 (*)    Hgb urine dipstick TRACE (*)    Ketones, ur 15 (*)    All other components within normal limits  URINE MICROSCOPIC-ADD ON - Abnormal; Notable for the following:    Squamous Epithelial / LPF FEW (*)    All other components within normal limits  CBG MONITORING, ED - Abnormal; Notable for the following:    Glucose-Capillary 563 (*)    All other components within normal limits  CBG MONITORING, ED - Abnormal;  Notable for the following:    Glucose-Capillary 373 (*)    All other components within normal limits  CBC  I-STAT TROPOININ, ED    Imaging Review Dg Chest 2 View  09/15/2014   CLINICAL DATA:  Hyperglycemia for 2 days. History of hypertension, diabetes and myocardial infarction.  EXAM: CHEST  2 VIEW  COMPARISON:  05/26/2013; 02/14/2013; 05/02/2012  FINDINGS: Grossly unchanged cardiac silhouette and mediastinal contours. No focal parenchymal opacities. No pleural effusion or pneumothorax. No evidence of edema. Unchanged bones including old right-sided seventh posterior lateral rib fracture.  IMPRESSION: No acute cardiopulmonary disease.   Electronically Signed   By: Simonne Come M.D.   On: 09/15/2014 19:48     EKG Interpretation   Date/Time:  Wednesday Sep 15 2014 19:21:55 EDT Ventricular Rate:  78 PR Interval:  138 QRS Duration: 81 QT Interval:  417 QTC Calculation: 475 R Axis:   42 Text Interpretation:  Sinus rhythm Low voltage, precordial leads  Anteroseptal infarct, old ED PHYSICIAN INTERPRETATION AVAILABLE IN [facility] Confirmed by TEST, Record ([id number]) on 09/17/2014 8:05:01 AM       Meds given in ED:  Medications  sodium chloride 0.9 % bolus 1,000 mL (0 mLs Intravenous Stopped 09/15/14 2109)  insulin aspart (novoLOG) injection 10 Units (10 Units Intravenous Given 09/15/14 1922)  sodium chloride 0.9 % bolus 1,000 mL (0 mLs Intravenous Stopped 09/15/14 2237)  insulin aspart (novoLOG) injection 5 Units (5  Units Subcutaneous Given 09/15/14 2054)    Discharge Medication List as of 09/15/2014 10:45 PM        MDM   Final diagnoses:  Hyperglycemia  Dehydration   Pt presenting with hyperglycemia for 2 days. No abdominal pain or symptoms. No infectious symptoms. No symptoms reported. Pt reports compliance with medications. VSS. Initial glucose of 780 which was reduced to 373 prior to discharge. No evidence of DKA. No PNA or UTI. Pt with yeast infection. Discussed monostat use. Pt dehydrated on exam and orthostatic.Pt with electrolyte abnormalities consistent with diuretic use. Elevated creatinine in part from dehydration. Pt to have rechecked by PCP.  Unclear cause of hyperglycemia. Pt to follow up with PCP closely for medical management of DM.  Discussed return precautions with patient. Discussed all results and patient verbalizes understanding and agrees with plan.  Case has been discussed with Dr. Donnald Garre who agrees with the above plan and to discharge.      Oswaldo Conroy, PA-C 09/17/14 1114  [address] [place] 09/17/14 1114  Arby Barrette, MD 09/18/14 [phone]

## 2014-09-15 NOTE — Discharge Instructions (Signed)
Return to the emergency room with worsening of symptoms, new symptoms or with symptoms that are concerning , especially chest pain. Shortness of breath, nausea, vomiting, abdominal pain, headache, visual changes.  Please call your doctor for a followup appointment within 24-48 hours. When you talk to your doctor please let them know that you were seen in the emergency department and have them acquire all of your records so that they can discuss the findings with you and formulate a treatment plan to fully care for your new and ongoing problems.  Read below information and follow recommendations.  Hyperglycemia Hyperglycemia occurs when the glucose (sugar) in your blood is too high. Hyperglycemia can happen for many reasons, but it most often happens to people who do not know they have diabetes or are not managing their diabetes properly.  CAUSES  Whether you have diabetes or not, there are other causes of hyperglycemia. Hyperglycemia can occur when you have diabetes, but it can also occur in other situations that you might not be as aware of, such as: Diabetes  If you have diabetes and are having problems controlling your blood glucose, hyperglycemia could occur because of some of the following reasons:  Not following your meal plan.  Not taking your diabetes medications or not taking it properly.  Exercising less or doing less activity than you normally do.  Being sick. Pre-diabetes  This cannot be ignored. Before people develop Type 2 diabetes, they almost always have "pre-diabetes." This is when your blood glucose levels are higher than normal, but not yet high enough to be diagnosed as diabetes. Research has shown that some long-term damage to the body, especially the heart and circulatory system, may already be occurring during pre-diabetes. If you take action to manage your blood glucose when you have pre-diabetes, you may delay or prevent Type 2 diabetes from developing. Stress  If you  have diabetes, you may be "diet" controlled or on oral medications or insulin to control your diabetes. However, you may find that your blood glucose is higher than usual in the hospital whether you have diabetes or not. This is often referred to as "stress hyperglycemia." Stress can elevate your blood glucose. This happens because of hormones put out by the body during times of stress. If stress has been the cause of your high blood glucose, it can be followed regularly by your caregiver. That way he/she can make sure your hyperglycemia does not continue to get worse or progress to diabetes. Steroids  Steroids are medications that act on the infection fighting system (immune system) to block inflammation or infection. One side effect can be a rise in blood glucose. Most people can produce enough extra insulin to allow for this rise, but for those who cannot, steroids make blood glucose levels go even higher. It is not unusual for steroid treatments to "uncover" diabetes that is developing. It is not always possible to determine if the hyperglycemia will go away after the steroids are stopped. A special blood test called an A1c is sometimes done to determine if your blood glucose was elevated before the steroids were started. SYMPTOMS  Thirsty.  Frequent urination.  Dry mouth.  Blurred vision.  Tired or fatigue.  Weakness.  Sleepy.  Tingling in feet or leg. DIAGNOSIS  Diagnosis is made by monitoring blood glucose in one or all of the following ways:  A1c test. This is a chemical found in your blood.  Fingerstick blood glucose monitoring.  Laboratory results. TREATMENT  First, knowing  the cause of the hyperglycemia is important before the hyperglycemia can be treated. Treatment may include, but is not be limited to:  Education.  Change or adjustment in medications.  Change or adjustment in meal plan.  Treatment for an illness, infection, etc.  More frequent blood glucose  monitoring.  Change in exercise plan.  Decreasing or stopping steroids.  Lifestyle changes. HOME CARE INSTRUCTIONS   Test your blood glucose as directed.  Exercise regularly. Your caregiver will give you instructions about exercise. Pre-diabetes or diabetes which comes on with stress is helped by exercising.  Eat wholesome, balanced meals. Eat often and at regular, fixed times. Your caregiver or nutritionist will give you a meal plan to guide your sugar intake.  Being at an ideal weight is important. If needed, losing as little as 10 to 15 pounds may help improve blood glucose levels. SEEK MEDICAL CARE IF:   You have questions about medicine, activity, or diet.  You continue to have symptoms (problems such as increased thirst, urination, or weight gain). SEEK IMMEDIATE MEDICAL CARE IF:   You are vomiting or have diarrhea.  Your breath smells fruity.  You are breathing faster or slower.  You are very sleepy or incoherent.  You have numbness, tingling, or pain in your feet or hands.  You have chest pain.  Your symptoms get worse even though you have been following your caregiver's orders.  If you have any other questions or concerns. Document Released: 10/03/2000 Document Revised: 07/02/2011 Document Reviewed: 08/06/2011 [facility] Patient Information 2015 [place], [place]. This information is not intended to replace advice given to you by your health care provider. Make sure you discuss any questions you have with your health care provider.

## 2014-09-29 ENCOUNTER — Ambulatory Visit (INDEPENDENT_AMBULATORY_CARE_PROVIDER_SITE_OTHER): Payer: Medicare Other | Admitting: Ophthalmology

## 2014-09-29 DIAGNOSIS — E10359 Type 1 diabetes mellitus with proliferative diabetic retinopathy without macular edema: Secondary | ICD-10-CM | POA: Diagnosis not present

## 2014-09-29 DIAGNOSIS — E10319 Type 1 diabetes mellitus with unspecified diabetic retinopathy without macular edema: Secondary | ICD-10-CM

## 2014-09-29 DIAGNOSIS — H43813 Vitreous degeneration, bilateral: Secondary | ICD-10-CM | POA: Diagnosis not present

## 2014-11-01 ENCOUNTER — Ambulatory Visit (HOSPITAL_COMMUNITY)
Admission: RE | Admit: 2014-11-01 | Discharge: 2014-11-01 | Disposition: A | Discharge status: Home / Self Care | Payer: Medicare Other | Source: Ambulatory Visit | Attending: Cardiology | Admitting: Cardiology

## 2014-11-01 VITALS — BP 104/62 | HR 79 | Wt 160.5 lb

## 2014-11-01 DIAGNOSIS — I5022 Chronic systolic (congestive) heart failure: Secondary | ICD-10-CM | POA: Insufficient documentation

## 2014-11-01 DIAGNOSIS — K3184 Gastroparesis: Secondary | ICD-10-CM | POA: Insufficient documentation

## 2014-11-01 DIAGNOSIS — I255 Ischemic cardiomyopathy: Secondary | ICD-10-CM | POA: Insufficient documentation

## 2014-11-01 DIAGNOSIS — D509 Iron deficiency anemia, unspecified: Secondary | ICD-10-CM | POA: Diagnosis not present

## 2014-11-01 DIAGNOSIS — F329 Major depressive disorder, single episode, unspecified: Secondary | ICD-10-CM | POA: Diagnosis not present

## 2014-11-01 DIAGNOSIS — E1121 Type 2 diabetes mellitus with diabetic nephropathy: Secondary | ICD-10-CM | POA: Diagnosis not present

## 2014-11-01 DIAGNOSIS — Z79899 Other long term (current) drug therapy: Secondary | ICD-10-CM | POA: Insufficient documentation

## 2014-11-01 DIAGNOSIS — E785 Hyperlipidemia, unspecified: Secondary | ICD-10-CM | POA: Insufficient documentation

## 2014-11-01 DIAGNOSIS — E11319 Type 2 diabetes mellitus with unspecified diabetic retinopathy without macular edema: Secondary | ICD-10-CM | POA: Diagnosis not present

## 2014-11-01 DIAGNOSIS — Z7902 Long term (current) use of antithrombotics/antiplatelets: Secondary | ICD-10-CM | POA: Insufficient documentation

## 2014-11-01 DIAGNOSIS — I10 Essential (primary) hypertension: Secondary | ICD-10-CM | POA: Insufficient documentation

## 2014-11-01 DIAGNOSIS — Z7982 Long term (current) use of aspirin: Secondary | ICD-10-CM | POA: Insufficient documentation

## 2014-11-01 DIAGNOSIS — I2510 Atherosclerotic heart disease of native coronary artery without angina pectoris: Secondary | ICD-10-CM | POA: Insufficient documentation

## 2014-11-01 DIAGNOSIS — E559 Vitamin D deficiency, unspecified: Secondary | ICD-10-CM | POA: Insufficient documentation

## 2014-11-01 DIAGNOSIS — Z794 Long term (current) use of insulin: Secondary | ICD-10-CM | POA: Diagnosis not present

## 2014-11-01 LAB — LIPID PANEL
Cholesterol: 119 mg/dL (ref 0–200)
HDL: 43 mg/dL (ref >40)
LDL Cholesterol: 53 mg/dL (ref 0–99)
Total CHOL/HDL Ratio: 2.8 RATIO
Triglycerides: 115 mg/dL (ref <150)
VLDL: 23 mg/dL (ref 0–40)

## 2014-11-01 LAB — BASIC METABOLIC PANEL
Anion gap: 10 (ref 5–15)
BUN: 22 mg/dL — ABNORMAL HIGH (ref 6–20)
CHLORIDE: 101 mmol/L (ref 101–111)
CO2: 28 mmol/L (ref 22–32)
Calcium: 9.7 mg/dL (ref 8.9–10.3)
Creatinine, Ser: 1.34 mg/dL — ABNORMAL HIGH (ref 0.44–1.00)
GFR calc Af Amer: 50 mL/min — ABNORMAL LOW (ref >60)
GFR, EST NON AFRICAN AMERICAN: 43 mL/min — AB (ref >60)
Glucose, Bld: 241 mg/dL — ABNORMAL HIGH (ref 65–99)
POTASSIUM: 4.3 mmol/L (ref 3.5–5.1)
Sodium: 139 mmol/L (ref 135–145)

## 2014-11-01 LAB — DIGOXIN LEVEL

## 2014-11-01 NOTE — Patient Instructions (Signed)
Routine lab work today. Will notify you of abnormal results, otherwise no news is good news!  Follow up 6 months.  Do the following things EVERYDAY: 1) Weigh yourself in the morning before breakfast. Write it down and keep it in a log. 2) Take your medicines as prescribed 3) Eat low salt foods-Limit salt (sodium) to 2000 mg per day.  4) Stay as active as you can everyday 5) Limit all fluids for the day to less than 2 liters  

## 2014-11-01 NOTE — Progress Notes (Signed)
Patient ID: [name], female   DOB: 03/17/1961, 54 y.o.   MRN: [id number] PCP: Dr. Evlyn KannerSouth  54 yo with history of Type I diabetes, CAD, and ischemic cardiomyopathy presents for cardiology followup.  She had a prolonged admission with DKA and systolic CHF in 12/11.  Cardiac enzymes were found to be elevated and left heart cath showed occluded LAD, which appeared chronic at the time of cath.  Echo showed peri-apical akinesis with EF 30%.  Management was complicated by orthostatic hypotension causing syncope.  This was likely due to combination of low cardiac output and diabetic autonomic neuropathy.  This limited titration of cardiac medications initially. Repeat echo (1/12) showed EF 40-45% (improved).  She was readmitted with DKA in 3/12, and cardiac enzymes were noted to be quite elevated with troponin peaking at 24 and CKMB at 69.  Repeat LHC was done, actually showing no significant difference from the 12/11 study.    In 5/12, she noted weakness one day in her right leg and arm.  She had a head MRI done, showing a small acute left parietal infarction.  This stroke occurred while on ASA and Plavix.  She has improved but her left arm still has some weakness. Carotid dopplers showed no significant carotid stenosis.  I did a limited echo to look for LV thrombus, and this was not seen.  3 week event monitor showed no atrial fibrillation.    Echo in 5/14 showed EF 40%.  We had to cut back on her cardiac meds due to hyperkalemia and hypotension.  She is now off ACEI completely (was hypotensive with even a low dose).  She was admitted again in 12/14 with DKA and elevated troponin (peak 2).  No chest pain.  Echo in 12/14 showed EF down to 25-30% but wall motion abnormality pattern was the same.  Given elevated creatinine, no chest pain, and very elevated troponin at prior admit with DKA without new coronary disease, I did not cath her.  Repeat echo in 3/15 showed EF 40-45% with anteroseptal and apical severe  hypokinesis to akinesis.    Since last visit, she has been stable.  She is not very active and uses her walker or a cane for stability but denies exertional dyspnea.  No chest pain.  No orthopnea/PND.  No tachypalpitations.  No falls or lightheadedness though BP is low today. She does get out some to go to church.    Labs (12/11): K 3.7, creatinine 0.88, BNP 612, LDL 29, HDL 44 Labs (1/12): digoxin 1.0, BNP 199, K 4.4, creatinine 0.9 Labs (5/12): digoxin 0.4 Labs (6/12): K 4.2, creatinine 0.99 Labs (7/12): K 6.2, creatinine 1.3 (KCl and spironolactone held).  Labs (7/12), repeat: K 4.8, creatinine 1.2, BNP 173 Labs (11/12): K 4.6, creatinine 1.6 => 1.3, TnI 0.7 Labs (12/12): K 4.9, creatinine 1.5, LDL 76, HDL 55, BNP 103, digoxin 1.0 Labs (3/13): K 4.2, creatinine 1.1, digoxin 1.0 Labs (6/13): LDL 57, HDL 51, digoxin 0.3 Labs (11/6508/13): K 4.4, creatinine 1.4 Labs (1/14): BNP 253 => 108, K 4.4, creatinine 1.3 Labs (4/14): K 3.7, creatinine 1.2, LDL 61, HDL 57 Labs (12/14): K 4.2, creatinine 1.79, digoxin 0.7 Labs (2/15): K 4.8, creatinine 1.07, digoxin 0.4, HCT 41.5, LFTs normal Labs (3/15): LDL 64, HDL 52 Labs (7/15): K 4.7, creatinine 1.25, digoxin 0.4 Labs (11/15): digoxin 0.5 Labs (5/16): K 4, creatinine 1.92, HCT 43  Allergies (verified):  1)  Reglan 2)  Erythromycin  Past Medical History: 1. Hypertension: However, after  MI had orthostatic hypotension.  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 CAD: NSTEMI 12/11.  Cardiac catheterization (12/11) showing LAD totalled in the midportion, diagonal 1 70% stenosed (small 2-mm vessel), circumflex nonobstructive, OM1 40%, OM2 30%, RCA small and no significant disease.  LAD occlusion was calcified and appeared chronic at the time of catheterization so no intervention.  Patient readmitted with DKA in 3/12, troponin found to be 24 and CKMB 69.  Repeat LHC showed no change from 12/11 study.  5. Ischemic cardiomyopathy: Echo (12/11)  with EF 30% and periapical akinesis, no significant MR, RV looked normal.  QRS is not wide on ECG.  Echo (1/12): EF 40-45%, severe hypokinesis of the anteroseptal wall, apical inferior wall, inferoseptal wall, and apex.  Hyperkalemia with spironolactone and with > 2.5 mg daily enalapril.  Echo 7/12 with EF 40-45%.  Echo (5/14): EF 40%, mid-apical anteroseptal hypokinesis, grade II diastolic dysfunction. Echo (12/14) with EF 25-30%, periapical akinesis. Echo (3/15) with EF 40-45%, anteroseptal and apical severe hypokinesis to akinesis.  6. Orthostatic hypotension: Syncopal episode in 12/11 likely due to this.  Probably from combination of depressed cardiac output and diabetic autonomic neuropathy.  7. Type 1 insulin-dependent diabetes. History of DKA.  8. Hyperlipidemia.  9. Diabetic retinopathy.  10. Gastroparesis.  11. Diabetic nephropathy  12. Depression.  13. 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  Carotid dopplers (7/12) with no significant disease.  Limited echo in 7/12 did not show an LV apical thrombus.  3-week event monitor in 7/12 did not show any runs of atrial fibrillation.   Family History: No FH of Colon Cancer: Family History of Pancreatic Cancer: Father deceased 51 No premature CAD  Social History: Divorced, 1 boy unemployed, lives in [place] Patient has never smoked.  Alcohol Use - no Daily Caffeine Use 1 cup/day Illicit Drug Use - no  ROS: All systems reviewed and negative except as per HPI.    Current Outpatient Prescriptions  Medication Sig Dispense Refill  . aspirin EC 81 MG tablet Take 81 mg by mouth at bedtime.     . Calcium-Magnesium-Vitamin D (CALCIUM 500 PO) Take 500 mg by mouth daily with lunch.    . carvedilol (COREG) 6.25 MG tablet Take 6.25 mg by mouth 2 (two) times daily.    . citalopram (CELEXA) 20 MG tablet Take 20 mg by mouth daily.     . clopidogrel (PLAVIX) 75 MG tablet Take 1 tablet (75 mg total) by mouth daily. 30 tablet 6  . digoxin  (LANOXIN) 0.125 MG tablet Take 0.0625 mg by mouth every other day.    . DULoxetine (CYMBALTA) 30 MG capsule Take 30 mg by mouth at bedtime.    . ferrous sulfate 325 (65 FE) MG tablet Take 650 mg by mouth daily with supper.     . furosemide (LASIX) 20 MG tablet TAKE 1 TABLET (20 MG TOTAL) BY MOUTH DAILY. 30 tablet 6  . Insulin Degludec 100 UNIT/ML SOPN Inject 24 Units into the skin daily.    . insulin lispro (HUMALOG) 100 UNIT/ML injection Inject 2-4 Units into the skin See admin instructions. Sliding scale - patient injects 2-4 units subq as needed depending on meter reading    . LORazepam (ATIVAN) 0.5 MG tablet Take 0.5-1 mg by mouth daily.    . Multiple Vitamin (MULTIVITAMIN WITH MINERALS) TABS tablet Take 1 tablet by mouth daily with lunch.    . rosuvastatin (CRESTOR) 40 MG tablet Take 40 mg by mouth daily with supper.  30 tablet 11  . Vitamin D, Ergocalciferol, (DRISDOL) 50000 UNITS CAPS Take 50,000 Units by mouth every 7 (seven) days. Saturday     No current facility-administered medications for this encounter.    BP 104/62 mmHg  Pulse 79  Wt 160 lb 8 oz (72.802 kg)  SpO2 98%  LMP 03/13/2011 General:  Well developed, well nourished, in no acute distress. Neck:  Neck thick, JVP difficult, suspect not elevated. No masses, thyromegaly or abnormal cervical nodes. Lungs:  Crackles right base Heart:  Non-displaced PMI, chest non-tender; regular rate and rhythm, S1, S2 without murmurs, rubs or gallops. Carotid upstroke normal, no bruit. Pedals normal pulses. No edema.   Abdomen:  Bowel sounds positive; abdomen soft and non-tender without masses, organomegaly, or hernias noted. No hepatosplenomegaly. Extremities:  No clubbing or cyanosis. Neurologic:  Alert and oriented x 3. Right arm is weak.  Psych:  Normal affect.  Assessment/Plan:  CHRONIC SYSTOLIC HEART FAILURE  Ischemic cardiomyopathy.  EF 25-30% on 12/14 echo.  This was lower than the prior, but was in the setting of acute DKA  which may have affected her LV function. Followup echo in 3/15 showed EF 40-45% with anteroseptal and apical wall motion abnormalities.  This is more in line with prior echoes.  She is out of range for ICD placement.  She is off spironolactone as she developed hyperkalemia while taking this medication and enalapril was stopped due to low BP. I will continue her on the current doses of Coreg, digoxin, and Lasix.  I do not think that she will tolerate up-titration of her meds.  BP is on the low side today but she denies lightheadedness.  She looks euvolemic on current Lasix.   - Check BMET and digoxin level today.   - I would like to see her more active, would consider using exercise bike.  CAD Known coronary disease with occluded mid-LAD.  She had troponin peaking at 2 during 12/14 admission with DKA.  She did not have chest pain and wall motion abnormality pattern on echo was similar to the past.  Creatinine was elevated.  She had a similar DKA event in 3/12 with very elevated TnI and catheterization showing no changes in her coronary disease.   I elected not to re-cath her in 12/14.  She denies any chest pain or dyspnea currently. Continue ASA 81, Plavix, and Crestor.   CVA  Occurred while on aspirin and Plavix. Some residual right arm weakness. Carotid dopplers showed no significant disease and limited echo to look for LV apical thrombus was unremarkable. 3-week monitor did not show atrial fibrillation.  HYPERLIPIDEMIA  Continue Crestor.  Check lipids today.  Renal Creatinine 1.9 when last checked in 5/16 but she was markedly hyperglycemic.  Repeat BMET today.    Followup in 6 months.     Marca Ancona 11/01/2014

## 2015-02-01 ENCOUNTER — Ambulatory Visit (INDEPENDENT_AMBULATORY_CARE_PROVIDER_SITE_OTHER): Payer: Medicare Other

## 2015-02-01 DIAGNOSIS — Z23 Encounter for immunization: Secondary | ICD-10-CM | POA: Diagnosis not present

## 2015-03-08 ENCOUNTER — Ambulatory Visit: Payer: Medicare Other | Admitting: Cardiology

## 2015-03-31 ENCOUNTER — Ambulatory Visit (INDEPENDENT_AMBULATORY_CARE_PROVIDER_SITE_OTHER): Payer: Medicare Other | Admitting: Ophthalmology

## 2015-03-31 DIAGNOSIS — E10311 Type 1 diabetes mellitus with unspecified diabetic retinopathy with macular edema: Secondary | ICD-10-CM | POA: Diagnosis not present

## 2015-03-31 DIAGNOSIS — H43813 Vitreous degeneration, bilateral: Secondary | ICD-10-CM

## 2015-03-31 DIAGNOSIS — E103593 Type 1 diabetes mellitus with proliferative diabetic retinopathy without macular edema, bilateral: Secondary | ICD-10-CM | POA: Diagnosis not present

## 2015-05-22 ENCOUNTER — Emergency Department (HOSPITAL_COMMUNITY): Payer: Medicare Other

## 2015-05-22 ENCOUNTER — Encounter (HOSPITAL_COMMUNITY): Payer: Self-pay | Admitting: Nurse Practitioner

## 2015-05-22 ENCOUNTER — Inpatient Hospital Stay (HOSPITAL_COMMUNITY)
Admission: EM | Admit: 2015-05-22 | Discharge: 2015-05-25 | DRG: 065 | Disposition: A | Discharge status: Rehabilitation Facility | Payer: Medicare Other | Attending: Internal Medicine | Admitting: Internal Medicine

## 2015-05-22 DIAGNOSIS — E1021 Type 1 diabetes mellitus with diabetic nephropathy: Secondary | ICD-10-CM | POA: Diagnosis present

## 2015-05-22 DIAGNOSIS — E119 Type 2 diabetes mellitus without complications: Secondary | ICD-10-CM | POA: Diagnosis not present

## 2015-05-22 DIAGNOSIS — I2510 Atherosclerotic heart disease of native coronary artery without angina pectoris: Secondary | ICD-10-CM | POA: Diagnosis not present

## 2015-05-22 DIAGNOSIS — I10 Essential (primary) hypertension: Secondary | ICD-10-CM | POA: Insufficient documentation

## 2015-05-22 DIAGNOSIS — Z91018 Allergy to other foods: Secondary | ICD-10-CM

## 2015-05-22 DIAGNOSIS — Z881 Allergy status to other antibiotic agents status: Secondary | ICD-10-CM

## 2015-05-22 DIAGNOSIS — I5022 Chronic systolic (congestive) heart failure: Secondary | ICD-10-CM | POA: Diagnosis present

## 2015-05-22 DIAGNOSIS — E1059 Type 1 diabetes mellitus with other circulatory complications: Secondary | ICD-10-CM | POA: Insufficient documentation

## 2015-05-22 DIAGNOSIS — E1142 Type 2 diabetes mellitus with diabetic polyneuropathy: Secondary | ICD-10-CM | POA: Insufficient documentation

## 2015-05-22 DIAGNOSIS — R531 Weakness: Secondary | ICD-10-CM

## 2015-05-22 DIAGNOSIS — E1042 Type 1 diabetes mellitus with diabetic polyneuropathy: Secondary | ICD-10-CM | POA: Diagnosis present

## 2015-05-22 DIAGNOSIS — Z7982 Long term (current) use of aspirin: Secondary | ICD-10-CM

## 2015-05-22 DIAGNOSIS — R2681 Unsteadiness on feet: Secondary | ICD-10-CM | POA: Diagnosis not present

## 2015-05-22 DIAGNOSIS — Z888 Allergy status to other drugs, medicaments and biological substances status: Secondary | ICD-10-CM

## 2015-05-22 DIAGNOSIS — E785 Hyperlipidemia, unspecified: Secondary | ICD-10-CM | POA: Diagnosis present

## 2015-05-22 DIAGNOSIS — Z66 Do not resuscitate: Secondary | ICD-10-CM | POA: Diagnosis present

## 2015-05-22 DIAGNOSIS — H4923 Sixth [abducent] nerve palsy, bilateral: Secondary | ICD-10-CM | POA: Diagnosis not present

## 2015-05-22 DIAGNOSIS — H532 Diplopia: Secondary | ICD-10-CM | POA: Insufficient documentation

## 2015-05-22 DIAGNOSIS — I252 Old myocardial infarction: Secondary | ICD-10-CM

## 2015-05-22 DIAGNOSIS — Z8614 Personal history of Methicillin resistant Staphylococcus aureus infection: Secondary | ICD-10-CM

## 2015-05-22 DIAGNOSIS — Z8673 Personal history of transient ischemic attack (TIA), and cerebral infarction without residual deficits: Secondary | ICD-10-CM | POA: Insufficient documentation

## 2015-05-22 DIAGNOSIS — Z794 Long term (current) use of insulin: Secondary | ICD-10-CM

## 2015-05-22 DIAGNOSIS — Z79899 Other long term (current) drug therapy: Secondary | ICD-10-CM

## 2015-05-22 DIAGNOSIS — R509 Fever, unspecified: Secondary | ICD-10-CM

## 2015-05-22 DIAGNOSIS — D509 Iron deficiency anemia, unspecified: Secondary | ICD-10-CM | POA: Diagnosis present

## 2015-05-22 DIAGNOSIS — K219 Gastro-esophageal reflux disease without esophagitis: Secondary | ICD-10-CM | POA: Diagnosis present

## 2015-05-22 DIAGNOSIS — I959 Hypotension, unspecified: Secondary | ICD-10-CM | POA: Diagnosis present

## 2015-05-22 DIAGNOSIS — K3184 Gastroparesis: Secondary | ICD-10-CM | POA: Diagnosis present

## 2015-05-22 DIAGNOSIS — I69359 Hemiplegia and hemiparesis following cerebral infarction affecting unspecified side: Secondary | ICD-10-CM | POA: Insufficient documentation

## 2015-05-22 DIAGNOSIS — I255 Ischemic cardiomyopathy: Secondary | ICD-10-CM | POA: Diagnosis present

## 2015-05-22 DIAGNOSIS — F329 Major depressive disorder, single episode, unspecified: Secondary | ICD-10-CM | POA: Diagnosis present

## 2015-05-22 DIAGNOSIS — E1043 Type 1 diabetes mellitus with diabetic autonomic (poly)neuropathy: Secondary | ICD-10-CM | POA: Diagnosis present

## 2015-05-22 DIAGNOSIS — G839 Paralytic syndrome, unspecified: Secondary | ICD-10-CM | POA: Diagnosis present

## 2015-05-22 DIAGNOSIS — I6302 Cerebral infarction due to thrombosis of basilar artery: Secondary | ICD-10-CM | POA: Diagnosis not present

## 2015-05-22 DIAGNOSIS — I69351 Hemiplegia and hemiparesis following cerebral infarction affecting right dominant side: Secondary | ICD-10-CM

## 2015-05-22 DIAGNOSIS — I6930 Unspecified sequelae of cerebral infarction: Secondary | ICD-10-CM | POA: Insufficient documentation

## 2015-05-22 DIAGNOSIS — E10319 Type 1 diabetes mellitus with unspecified diabetic retinopathy without macular edema: Secondary | ICD-10-CM | POA: Diagnosis present

## 2015-05-22 DIAGNOSIS — E875 Hyperkalemia: Secondary | ICD-10-CM | POA: Diagnosis present

## 2015-05-22 DIAGNOSIS — Z7902 Long term (current) use of antithrombotics/antiplatelets: Secondary | ICD-10-CM

## 2015-05-22 DIAGNOSIS — I639 Cerebral infarction, unspecified: Secondary | ICD-10-CM | POA: Diagnosis present

## 2015-05-22 LAB — CBC WITH DIFFERENTIAL/PLATELET
Basophils Absolute: 0 10*3/uL (ref 0.0–0.1)
Basophils Relative: 0 %
Eosinophils Absolute: 0.6 10*3/uL (ref 0.0–0.7)
Eosinophils Relative: 8 %
HEMATOCRIT: 43.3 % (ref 36.0–46.0)
Hemoglobin: 14.3 g/dL (ref 12.0–15.0)
LYMPHS PCT: 24 %
Lymphs Abs: 1.8 10*3/uL (ref 0.7–4.0)
MCH: 30.2 pg (ref 26.0–34.0)
MCHC: 33 g/dL (ref 30.0–36.0)
MCV: 91.5 fL (ref 78.0–100.0)
Monocytes Absolute: 0.4 10*3/uL (ref 0.1–1.0)
Monocytes Relative: 6 %
NEUTROS PCT: 62 %
Neutro Abs: 4.5 10*3/uL (ref 1.7–7.7)
Platelets: 220 10*3/uL (ref 150–400)
RBC: 4.73 MIL/uL (ref 3.87–5.11)
RDW: 12.9 % (ref 11.5–15.5)
WBC: 7.4 10*3/uL (ref 4.0–10.5)

## 2015-05-22 LAB — CBG MONITORING, ED
GLUCOSE-CAPILLARY: 66 mg/dL (ref 65–99)
GLUCOSE-CAPILLARY: 130 mg/dL — AB (ref 65–99)

## 2015-05-22 LAB — BASIC METABOLIC PANEL
Anion gap: 8 (ref 5–15)
BUN: 19 mg/dL (ref 6–20)
CHLORIDE: 108 mmol/L (ref 101–111)
CO2: 29 mmol/L (ref 22–32)
Calcium: 9.3 mg/dL (ref 8.9–10.3)
Creatinine, Ser: 1.13 mg/dL — ABNORMAL HIGH (ref 0.44–1.00)
GFR, EST NON AFRICAN AMERICAN: 53 mL/min — AB (ref >60)
Glucose, Bld: 121 mg/dL — ABNORMAL HIGH (ref 65–99)
POTASSIUM: 4 mmol/L (ref 3.5–5.1)
Sodium: 145 mmol/L (ref 135–145)

## 2015-05-22 LAB — GLUCOSE, CAPILLARY: Glucose-Capillary: 129 mg/dL — ABNORMAL HIGH (ref 65–99)

## 2015-05-22 MED ORDER — FERROUS SULFATE 325 (65 FE) MG PO TABS
650.0000 mg | ORAL_TABLET | Freq: Every day | ORAL | Status: DC
  Administered 2015-05-22 – 2015-05-25 (×4): 650 mg via ORAL
  Filled 2015-05-22 (×4): qty 2

## 2015-05-22 MED ORDER — INSULIN ASPART 100 UNIT/ML ~~LOC~~ SOLN
0.0000 [IU] | Freq: Three times a day (TID) | SUBCUTANEOUS | Status: DC
  Administered 2015-05-24 – 2015-05-25 (×3): 3 [IU] via SUBCUTANEOUS
  Administered 2015-05-25: 11 [IU] via SUBCUTANEOUS
  Administered 2015-05-25: 3 [IU] via SUBCUTANEOUS

## 2015-05-22 MED ORDER — ASPIRIN EC 81 MG PO TBEC
81.0000 mg | DELAYED_RELEASE_TABLET | Freq: Every day | ORAL | Status: DC
  Administered 2015-05-22 – 2015-05-24 (×3): 81 mg via ORAL
  Filled 2015-05-22 (×3): qty 1

## 2015-05-22 MED ORDER — CARVEDILOL 6.25 MG PO TABS
6.2500 mg | ORAL_TABLET | Freq: Two times a day (BID) | ORAL | Status: DC
  Administered 2015-05-22: 6.25 mg via ORAL
  Filled 2015-05-22 (×2): qty 1

## 2015-05-22 MED ORDER — INSULIN ASPART 100 UNIT/ML ~~LOC~~ SOLN: 0.0000 [IU] | Freq: Every day | SUBCUTANEOUS | Status: DC

## 2015-05-22 MED ORDER — DULOXETINE HCL 30 MG PO CPEP
30.0000 mg | ORAL_CAPSULE | Freq: Every day | ORAL | Status: DC
  Administered 2015-05-23 – 2015-05-25 (×3): 30 mg via ORAL
  Filled 2015-05-22 (×3): qty 1

## 2015-05-22 MED ORDER — STROKE: EARLY STAGES OF RECOVERY BOOK
Freq: Once | Status: DC
  Filled 2015-05-22: qty 1

## 2015-05-22 MED ORDER — INSULIN DETEMIR 100 UNIT/ML ~~LOC~~ SOLN
18.0000 [IU] | Freq: Every day | SUBCUTANEOUS | Status: DC
  Administered 2015-05-22 – 2015-05-23 (×2): 18 [IU] via SUBCUTANEOUS
  Filled 2015-05-22 (×2): qty 0.18

## 2015-05-22 MED ORDER — FUROSEMIDE 20 MG PO TABS
20.0000 mg | ORAL_TABLET | Freq: Every day | ORAL | Status: DC
  Administered 2015-05-23 – 2015-05-25 (×2): 20 mg via ORAL
  Filled 2015-05-22 (×2): qty 1

## 2015-05-22 MED ORDER — ROSUVASTATIN CALCIUM 40 MG PO TABS
40.0000 mg | ORAL_TABLET | Freq: Every day | ORAL | Status: DC
  Administered 2015-05-22 – 2015-05-25 (×4): 40 mg via ORAL
  Filled 2015-05-22 (×5): qty 1

## 2015-05-22 MED ORDER — DIGOXIN 125 MCG PO TABS
0.0625 mg | ORAL_TABLET | ORAL | Status: DC
  Administered 2015-05-23 – 2015-05-25 (×3): 0.0625 mg via ORAL
  Filled 2015-05-22 (×3): qty 1

## 2015-05-22 MED ORDER — CITALOPRAM HYDROBROMIDE 20 MG PO TABS
20.0000 mg | ORAL_TABLET | Freq: Every day | ORAL | Status: DC
  Administered 2015-05-23 – 2015-05-25 (×3): 20 mg via ORAL
  Filled 2015-05-22 (×2): qty 1
  Filled 2015-05-22: qty 2

## 2015-05-22 MED ORDER — ENOXAPARIN SODIUM 40 MG/0.4ML ~~LOC~~ SOLN
40.0000 mg | SUBCUTANEOUS | Status: DC
  Administered 2015-05-22 – 2015-05-24 (×3): 40 mg via SUBCUTANEOUS
  Filled 2015-05-22 (×3): qty 0.4

## 2015-05-22 MED ORDER — CLOPIDOGREL BISULFATE 75 MG PO TABS
75.0000 mg | ORAL_TABLET | Freq: Every day | ORAL | Status: DC
  Administered 2015-05-23 – 2015-05-25 (×3): 75 mg via ORAL
  Filled 2015-05-22 (×3): qty 1

## 2015-05-22 NOTE — ED Notes (Signed)
The pt returned from c-t alert no distress mother at the bedside concerned about  The pts glucose.  And about her eating  No swallow screen performed yet.  Mother and pt informed

## 2015-05-22 NOTE — ED Notes (Signed)
Per EMS pt from home c/o generalized weakness starting yesterday, patient had one syncopal episode yesterday with some blurry vision. Patients weakness has progessed today and mother is no longer able, to move her around the house. Patient alert and oriented x4, no neuro defecits noted. Patient is able to stand on her own but needs lots of assistance.   CBG 225,

## 2015-05-22 NOTE — ED Provider Notes (Signed)
CSN: [id number]     Arrival date & time 05/22/15  1339 History   First MD Initiated Contact with Patient 05/22/15 1401     Chief Complaint  Patient presents with  . Weakness     (Consider location/radiation/quality/duration/timing/severity/associated sxs/prior Treatment) Patient is a 55 y.o. female presenting with weakness. The history is provided by the patient.  Weakness This is a new problem. The current episode started less than 1 hour ago. The problem occurs constantly. The problem has not changed since onset.Pertinent negatives include no chest pain, no headaches and no shortness of breath. Nothing aggravates the symptoms. Nothing relieves the symptoms. She has tried nothing for the symptoms. The treatment provided no relief.    55 yo F  With a chief complaint of generalized weakness. This started yesterday associated with some diplopia. Patient states she sees one image on top of the other. Improves when she closes either eye. Seems to be favoring closing her left eye. Denies recent head injury.  The patient has to use a walker secondary to severe stroke that has messed with her balance. Patient denies any cough congestion fevers abdominal pain. She thinks the weakness hasn't significantly changed since yesterday however her mother feels that it has gotten much worse today.  Past Medical History  Diagnosis Date  . Hypertension   . Vitamin D deficiency   . Coronary artery disease     a. (12/11) NSTEMI, in the setting of DKA, s/p Cath--LAD totalled in the midportion, diag 70% sten (small 2-mm ), LCx nonobst, OM1 40%, OM2 30%, RCA- no dz. chronic calcified LAD --> no intervention.  b. (06/2010) NSTEMI s/p cath showing same findings   . Ischemic cardiomyopathy   . Orthostatic hypotension     a. sycopal episode in 12/11   . Diabetes mellitus     a. Insulin dependant Type I   . Hyperlipidemia   . Diabetic nephropathy (HCC)   . Diabetic retinopathy   . Gastroparesis   . Depression   .  Systolic heart failure     a. (08/2012) EF: 40% Mod hypokin in mid-dist-ant-sept myocard--> psuedonml L vent fill pattern, Grd 2 diast dyscfxn  . Iron deficiency anemia   . Hx MRSA infection   . Osteopenia   . Esophagitis   . NSTEMI (non-ST elevated myocardial infarction) (HCC)     a. (03/2010) NSTEMI during admission for DKA  b.  . Stroke [facility])     a. (2012) residual R hand/arm weakness  b. while on ASA and plavix  . Diabetic neuropathy [facility])    Past Surgical History  Procedure Laterality Date  . Appendectomy  1980  . Tubal ligation  1987  . Cesarean section  1984  . Refractive surgery      x3   Family History  Problem Relation Age of Onset  . Pancreatic cancer Father   . Colon cancer Neg Hx   . Diabetes Maternal Grandfather    Social History  Substance Use Topics  . Smoking status: Never Smoker   . Smokeless tobacco: Never Used  . Alcohol Use: No   OB History    No data available     Review of Systems  Constitutional: Negative for fever and chills.  HENT: Negative for congestion and rhinorrhea.   Eyes: Negative for redness and visual disturbance.  Respiratory: Negative for shortness of breath and wheezing.   Cardiovascular: Negative for chest pain and palpitations.  Gastrointestinal: Negative for nausea and vomiting.  Genitourinary: Negative for dysuria  and urgency.  Musculoskeletal: Negative for myalgias and arthralgias.  Skin: Negative for pallor and wound.  Neurological: Positive for weakness. Negative for dizziness and headaches.      Allergies  Erythromycin; Food; and Metoclopramide hcl  Home Medications   Prior to Admission medications   Medication Sig Start Date End Date Taking? Authorizing Provider  aspirin EC 81 MG tablet Take 81 mg by mouth at bedtime.  10/17/10  Yes Laurey Morale, MD  Calcium Carb-Cholecalciferol (CALCIUM 500 +D PO) Take 500 mg by mouth daily with lunch.   Yes Historical Provider, MD  carvedilol (COREG) 6.25 MG tablet Take 6.25  mg by mouth 2 (two) times daily. 03/26/13  Yes Adrian Prince, MD  citalopram (CELEXA) 20 MG tablet Take 20 mg by mouth daily.  03/21/13  Yes Historical Provider, MD  clopidogrel (PLAVIX) 75 MG tablet Take 1 tablet (75 mg total) by mouth daily. 09/08/12  Yes Laurey Morale, MD  digoxin (LANOXIN) 0.125 MG tablet Take 0.0625 mg by mouth every other day.   Yes Historical Provider, MD  DULoxetine (CYMBALTA) 30 MG capsule Take 30 mg by mouth daily.    Yes Historical Provider, MD  ferrous sulfate 325 (65 FE) MG tablet Take 650 mg by mouth daily with supper.    Yes Historical Provider, MD  furosemide (LASIX) 20 MG tablet TAKE 1 TABLET (20 MG TOTAL) BY MOUTH DAILY. 05/17/14  Yes Dolores Patty, MD  Insulin Degludec (TRESIBA FLEXTOUCH) 200 UNIT/ML SOPN Inject 20 Units into the skin daily after breakfast.   Yes Historical Provider, MD  insulin lispro (HUMALOG KWIKPEN) 100 UNIT/ML KiwkPen Inject 1-7 Units into the skin 3 (three) times daily as needed (CBG >150). Per sliding scale: CBG 151-200 1 unit, 201-250 2 units, 251-300 3 units, 301-350 5 units, >350 7 units   Yes Historical Provider, MD  LORazepam (ATIVAN) 0.5 MG tablet Take 0.5 mg by mouth See admin instructions. Take 1 tablet (0.5 mg) by mouth daily at bedtime, may also take 1 tablet (0.5 mg) in the morning as needed for anxiety   Yes Historical Provider, MD  Multiple Vitamin (MULTIVITAMIN WITH MINERALS) TABS tablet Take 1 tablet by mouth daily with lunch.   Yes Historical Provider, MD  rosuvastatin (CRESTOR) 40 MG tablet Take 40 mg by mouth daily with supper.  08/17/10  Yes Laurey Morale, MD  Vitamin D, Ergocalciferol, (DRISDOL) 50000 UNITS CAPS Take 50,000 Units by mouth every 7 (seven) days. Saturday   Yes Historical Provider, MD   BP 127/65 mmHg  Pulse 70  Temp(Src) 98.1 F (36.7 C) (Oral)  Resp 14  Ht 5' 4.5" (1.638 m)  Wt 155 lb (70.308 kg)  BMI 26.20 kg/m2  SpO2 98%  LMP 03/13/2011 Physical Exam  Constitutional: She is oriented to  person, place, and time. She appears well-developed and well-nourished. No distress.  HENT:  Head: Normocephalic and atraumatic.  Eyes: EOM are normal. Pupils are equal, round, and reactive to light.  Neck: Normal range of motion. Neck supple.  Cardiovascular: Normal rate and regular rhythm.  Exam reveals no gallop and no friction rub.   No murmur heard. Pulmonary/Chest: Effort normal. She has no wheezes. She has no rales.  Abdominal: Soft. She exhibits no distension. There is no tenderness. There is no rebound and no guarding.  Musculoskeletal: She exhibits no edema or tenderness.  Neurological: She is alert and oriented to person, place, and time. She has normal strength. No cranial nerve deficit or sensory deficit. Coordination abnormal.  GCS eye subscore is 4. GCS verbal subscore is 5. GCS motor subscore is 6. She displays no Babinski's sign on the right side. She displays no Babinski's sign on the left side.  Reflex Scores:      Tricep reflexes are 2+ on the right side and 2+ on the left side.      Bicep reflexes are 2+ on the right side and 2+ on the left side.      Brachioradialis reflexes are 2+ on the right side and 2+ on the left side.      Patellar reflexes are 2+ on the right side and 2+ on the left side.      Achilles reflexes are 2+ on the right side and 2+ on the left side.  Patient with difficulty with finger to nose on the right side. Having wavering from baseline. Patient with difficulty with quick turn. Having short steps.  Skin: Skin is warm and dry. She is not diaphoretic.  Psychiatric: She has a normal mood and affect. Her behavior is normal.  Nursing note and vitals reviewed.   ED Course  Procedures (including critical care time) Labs Review Labs Reviewed  BASIC METABOLIC PANEL - Abnormal; Notable for the following:    Glucose, Bld 121 (*)    Creatinine, Ser 1.13 (*)    GFR calc non Af Amer 53 (*)    All other components within normal limits  CBG MONITORING, ED -  Abnormal; Notable for the following:    Glucose-Capillary 130 (*)    All other components within normal limits  CBC WITH DIFFERENTIAL/PLATELET  URINALYSIS, ROUTINE W REFLEX MICROSCOPIC (NOT AT [facility])  HEMOGLOBIN A1C  LIPID PANEL  CBC  CREATININE, SERUM  CBG MONITORING, ED    Imaging Review Ct Head Wo Contrast  05/22/2015  CLINICAL DATA:  Double vision at of left eye since yesterday. EXAM: CT HEAD WITHOUT CONTRAST TECHNIQUE: Contiguous axial images were obtained from the base of the skull through the vertex without intravenous contrast. COMPARISON:  05/26/2013. FINDINGS: There is no evidence for acute hemorrhage, hydrocephalus, mass lesion, or abnormal extra-axial fluid collection. No definite CT evidence for acute infarction. Patchy low attenuation in the deep hemispheric and periventricular white matter is nonspecific, but likely reflects chronic microvascular ischemic demyelination. Diffuse loss of parenchymal volume is consistent with atrophy. The visualized paranasal sinuses and mastoid air cells are clear. IMPRESSION: Stable exam. Mild atrophy for age with chronic small vessel white matter ischemic demyelination. No acute intracranial abnormality. Electronically Signed   By: Kennith Center M.D.   On: 05/22/2015 15:25   I have personally reviewed and evaluated these images and lab results as part of my medical decision-making.   EKG Interpretation   Date/Time:  Sunday May 22 2015 14:02:23 EST Ventricular Rate:  69 PR Interval:  123 QRS Duration: 97 QT Interval:  342 QTC Calculation: 366 R Axis:   24 Text Interpretation:  Sinus rhythm Ventricular premature complex Premature  ventricular complexes Otherwise no significant change Confirmed by Kanyla Omeara  MD, Reuel Boom ([id number]) on 05/22/2015 2:09:07 PM      MDM   Final diagnoses:  Weakness  Double vision    55 yo F  With a chief complaint of weakness. Patient also having vertical diplopia. This improves when she closes her left eye.  Wench closer right eyes she says she has no difficulty with vision however thinks that she still has some diplopia. Feel this is likely to be related just to the left eye is only  related to  Vision from that side. However patient also having wavering baseline on finger to nose with the right upper extremity. This is not documented on any of her past physical exams.  Will discuss the case with neurology.  Discussed with Dr. Lavon Paganini, will come to eval patient.   Turned over care to Dr. Jeraldine Loots.   The patients results and plan were reviewed and discussed.   Any x-rays performed were independently reviewed by myself.   Differential diagnosis were considered with the presenting HPI.  Medications  aspirin EC tablet 81 mg (not administered)  carvedilol (COREG) tablet 6.25 mg (not administered)  citalopram (CELEXA) tablet 20 mg (not administered)  clopidogrel (PLAVIX) tablet 75 mg (not administered)  digoxin (LANOXIN) tablet 0.0625 mg (not administered)  DULoxetine (CYMBALTA) DR capsule 30 mg (not administered)  ferrous sulfate tablet 650 mg (not administered)  furosemide (LASIX) tablet 20 mg (not administered)  rosuvastatin (CRESTOR) tablet 40 mg (not administered)  insulin detemir (LEVEMIR) injection 18 Units (not administered)  insulin aspart (novoLOG) injection 0-15 Units (not administered)  insulin aspart (novoLOG) injection 0-5 Units (not administered)   stroke: mapping our early stages of recovery book (not administered)  enoxaparin (LOVENOX) injection 40 mg (not administered)    Filed Vitals:   05/22/15 1501 05/22/15 1730 05/22/15 1800 05/22/15 1830  BP: 136/65 121/70 128/59 127/65  Pulse: 65 71 70 70  Temp:      TempSrc:      Resp: 15 24 13 14   Height:      Weight:      SpO2: 99% 99% 98% 98%    Final diagnoses:  Weakness  Double vision     Melene Plan, DO 05/22/15 1908

## 2015-05-22 NOTE — ED Notes (Addendum)
The pts mother is going home now.  She has a sister here in town heres her number call if needed  [phone]   Name [facility]

## 2015-05-22 NOTE — H&P (Addendum)
[facility] History and Physical  [name]:[id number] DOB: 1960/04/29 DOA: 05/22/2015   PCP: Catherine Hy, MD    Chief Complaint: double vision  HPI: [name] is a 55 y.o. female with past medical history of chronic systolic heart failure, coronary artery disease being medically managed, CVA, diabetes mellitus on insulin and hyperlipidemia who noticed double vision yesterday. She had some trouble with her walker and fell. Her mother thought she may have passed out but the patient clearly states that she remembers everything during the fall and did not pass out. Her vision remains blurry today and she is feeling quite weak. She has had a prior stroke with right-sided weakness and has not noticed any new focal acute weakness or numbness. No dysarthria. No other visual changes.    General: The patient denies anorexia, fever, weight loss Cardiac: Denies chest pain, syncope, palpitations, pedal edema  Respiratory: Denies cough, shortness of breath, wheezing GI: Denies severe indigestion/heartburn, abdominal pain, nausea, vomiting, diarrhea and constipation GU: Denies hematuria, incontinence, dysuria  Musculoskeletal: Denies arthritis  Skin: Denies suspicious skin lesions Neurologic: Denies focal weakness or numbness, change in vision Psychiatry: Denies depression or anxiety. Hematologic: no easy bruising or bleeding  All other systems reviewed and found to be negative.  Past Medical History  Diagnosis Date  . Hypertension   . Vitamin D deficiency   . Coronary artery disease     a. (12/11) NSTEMI, in the setting of DKA, s/p Cath--LAD totalled in the midportion, diag 70% sten (small 2-mm ), LCx nonobst, OM1 40%, OM2 30%, RCA- no dz. chronic calcified LAD --> no intervention.  b. (06/2010) NSTEMI s/p cath showing same findings   . Ischemic cardiomyopathy   . Orthostatic hypotension     a. sycopal episode in 12/11   . Diabetes mellitus     a. Insulin dependant Type I   .  Hyperlipidemia   . Diabetic nephropathy (HCC)   . Diabetic retinopathy   . Gastroparesis   . Depression   . Systolic heart failure     a. (08/2012) EF: 40% Mod hypokin in mid-dist-ant-sept myocard--> psuedonml L vent fill pattern, Grd 2 diast dyscfxn  . Iron deficiency anemia   . Hx MRSA infection   . Osteopenia   . Esophagitis   . NSTEMI (non-ST elevated myocardial infarction) (HCC)     a. (03/2010) NSTEMI during admission for DKA  b.  . Stroke [name])     a. (2012) residual R hand/arm weakness  b. while on ASA and plavix  . Diabetic neuropathy [facility])     Past Surgical History  Procedure Laterality Date  . Appendectomy  1980  . Tubal ligation  1987  . Cesarean section  1984  . Refractive surgery      x3    Social History: Does not smoke or drink Lives at him with her mother and manages ADLs--she walks with a walker    Allergies  Allergen Reactions  . Erythromycin Other (See Comments)    GI upset  . Food Nausea Only    Green peas  . Metoclopramide Hcl Other (See Comments)     fatique, depression    Family history:   Family History  Problem Relation Age of Onset  . Pancreatic cancer Father   . Colon cancer Neg Hx   . Diabetes Maternal Grandfather       Prior to Admission medications   Medication Sig Start Date End Date Taking? Authorizing Provider  aspirin EC 81  MG tablet Take 81 mg by mouth at bedtime.  10/17/10  Yes Laurey Morale, MD  Calcium Carb-Cholecalciferol (CALCIUM 500 +D PO) Take 500 mg by mouth daily with lunch.   Yes Historical Provider, MD  carvedilol (COREG) 6.25 MG tablet Take 6.25 mg by mouth 2 (two) times daily. 03/26/13  Yes Adrian Prince, MD  citalopram (CELEXA) 20 MG tablet Take 20 mg by mouth daily.  03/21/13  Yes Historical Provider, MD  clopidogrel (PLAVIX) 75 MG tablet Take 1 tablet (75 mg total) by mouth daily. 09/08/12  Yes Laurey Morale, MD  digoxin (LANOXIN) 0.125 MG tablet Take 0.0625 mg by mouth every other day.   Yes Historical  Provider, MD  DULoxetine (CYMBALTA) 30 MG capsule Take 30 mg by mouth daily.    Yes Historical Provider, MD  ferrous sulfate 325 (65 FE) MG tablet Take 650 mg by mouth daily with supper.    Yes Historical Provider, MD  furosemide (LASIX) 20 MG tablet TAKE 1 TABLET (20 MG TOTAL) BY MOUTH DAILY. 05/17/14  Yes Dolores Patty, MD  Insulin Degludec (TRESIBA FLEXTOUCH) 200 UNIT/ML SOPN Inject 20 Units into the skin daily after breakfast.   Yes Historical Provider, MD  insulin lispro (HUMALOG KWIKPEN) 100 UNIT/ML KiwkPen Inject 1-7 Units into the skin 3 (three) times daily as needed (CBG >150). Per sliding scale: CBG 151-200 1 unit, 201-250 2 units, 251-300 3 units, 301-350 5 units, >350 7 units   Yes Historical Provider, MD  LORazepam (ATIVAN) 0.5 MG tablet Take 0.5 mg by mouth See admin instructions. Take 1 tablet (0.5 mg) by mouth daily at bedtime, may also take 1 tablet (0.5 mg) in the morning as needed for anxiety   Yes Historical Provider, MD  Multiple Vitamin (MULTIVITAMIN WITH MINERALS) TABS tablet Take 1 tablet by mouth daily with lunch.   Yes Historical Provider, MD  rosuvastatin (CRESTOR) 40 MG tablet Take 40 mg by mouth daily with supper.  08/17/10  Yes Laurey Morale, MD  Vitamin D, Ergocalciferol, (DRISDOL) 50000 UNITS CAPS Take 50,000 Units by mouth every 7 (seven) days. Saturday   Yes Historical Provider, MD     Physical Exam: Filed Vitals:   05/22/15 1455 05/22/15 1501 05/22/15 1730 05/22/15 1800  BP: 155/83 136/65 121/70 128/59  Pulse: 65 65 71 70  Temp:      TempSrc:      Resp: Height:      Weight:      SpO2: 99% 99% 99% 98%     General: Awake alert oriented 3, no acute distress HEENT: Normocephalic and Atraumatic, Mucous membranes pink                PERRLA; EOM intact; No scleral icterus,                 Nares: Patent, Oropharynx: Clear, Fair Dentition                 Neck: FROM, no cervical lymphadenopathy, thyromegaly, carotid bruit or JVD;  Breasts:  deferred CHEST WALL: No tenderness  CHEST: Normal respiration, clear to auscultation bilaterally  HEART: Regular rate and rhythm; no murmurs rubs or gallops  BACK: No kyphosis or scoliosis; no CVA tenderness  GI: Positive Bowel Sounds, soft, non-tender; no masses, no organomegaly Rectal Exam: deferred MSK: No cyanosis, clubbing, or edema Genitalia: not examined  SKIN:  no rash or ulceration  CNS: Alert and Oriented x 4, has double vision when looking over  to the right, CN 2-12 intact  Labs on Admission:  Basic Metabolic Panel:  Recent Labs Lab 05/22/15 1414  NA 145  K 4.0  CL 108  CO2 29  GLUCOSE 121*  BUN 19  CREATININE 1.13*  CALCIUM 9.3   Liver Function Tests: No results for input(s): AST, ALT, ALKPHOS, BILITOT, PROT, ALBUMIN in the last 168 hours. No results for input(s): LIPASE, AMYLASE in the last 168 hours. No results for input(s): AMMONIA in the last 168 hours. CBC:  Recent Labs Lab 05/22/15 1414  WBC 7.4  NEUTROABS 4.5  HGB 14.3  HCT 43.3  MCV 91.5  PLT 220   Cardiac Enzymes: No results for input(s): CKTOTAL, CKMB, CKMBINDEX, TROPONINI in the last 168 hours.  BNP (last 3 results) No results for input(s): BNP in the last 8760 hours.  ProBNP (last 3 results) No results for input(s): PROBNP in the last 8760 hours.  CBG:  Recent Labs Lab 05/22/15 1625 05/22/15 1742  GLUCAP 66 130*    Radiological Exams on Admission: Ct Head Wo Contrast  05/22/2015  CLINICAL DATA:  Double vision at of left eye since yesterday. EXAM: CT HEAD WITHOUT CONTRAST TECHNIQUE: Contiguous axial images were obtained from the base of the skull through the vertex without intravenous contrast. COMPARISON:  05/26/2013. FINDINGS: There is no evidence for acute hemorrhage, hydrocephalus, mass lesion, or abnormal extra-axial fluid collection. No definite CT evidence for acute infarction. Patchy low attenuation in the deep hemispheric and periventricular white matter is nonspecific,  but likely reflects chronic microvascular ischemic demyelination. Diffuse loss of parenchymal volume is consistent with atrophy. The visualized paranasal sinuses and mastoid air cells are clear. IMPRESSION: Stable exam. Mild atrophy for age with chronic small vessel white matter ischemic demyelination. No acute intracranial abnormality. Electronically Signed   By: Kennith [name] M.D.   On: 05/22/2015 15:25    EKG: Independently reviewed. Normal sinus rhythm  Assessment/Plan Principal Problem:   Double vision - CVA (cerebral infarction) Versus sixth nerve palsy per neurology-MRI and CTA recommended- will not order carotid duplex or ECHO unless imaging positive for CVA -Will order PT OT eval as well due to her weakness and falls --Continue aspirin, Plavix and statin  Active Problems:   Hyperlipidemia -Continue statin    Coronary atherosclerosis of native coronary artery -Continue aspirin and Plavix    Chronic systolic heart failure -Last Echo in 2015 revealed EF of 40-45% with grade 2 diastolic dysfunction and severe hypokinesis- --Low-dose furosemide- not on ACE inhibitor or ARB?     DM type 2, goal A1c below 7 -on Tresiba at home- will replace with Levemir- place on ISS as well    Consulted: Neurology  Code Status: DNR Family Communication:    DVT Prophylaxis:Lovenox  Time spent: 50  Giavanna Kang, MD [facility]  If 7PM-7AM, please contact night-coverage [email or web address] 05/22/2015, 6:27 PM

## 2015-05-22 NOTE — ED Notes (Signed)
The pts glucose is low.  Swallow screen  Performed.  She has passed the swallow screen.  Her mother reports that she has not eaten since this am at breakfast... Food given neuro here to see

## 2015-05-22 NOTE — ED Notes (Signed)
Pt still c/o her headache hyperventilating intermittently.  Iv not accessible decadron given im instead

## 2015-05-22 NOTE — Progress Notes (Signed)
Pt arrived to unit alert and oriented. Oriented to unit and room. Call bell at side. Bed alarm activated. Questions answered. Will continue to monitor. Gara Kroner, RN

## 2015-05-22 NOTE — Consult Note (Signed)
Requesting Physician: Dr.  Adela Lank    Reason for consultation: stroke  HPI:                                                                                                                                         [name] is an 55 y.o. female patient who presented to ER with some symptoms of double vision. She fell yesterday. Reports noticing double vision after the fall.. CT of the head negative in ER. History of child onset diabetes, prior stroke with some residual right-sided weakness. Denies any new weakness.    Date last known well:  05/21/2015 Time last known well:  Unknown  tPA Given: No: Outside time window  Stroke Risk Factors - diabetes mellitus, hyperlipidemia and hypertension  Past Medical History: Past Medical History  Diagnosis Date  . Hypertension   . Vitamin D deficiency   . Coronary artery disease     a. (12/11) NSTEMI, in the setting of DKA, s/p Cath--LAD totalled in the midportion, diag 70% sten (small 2-mm ), LCx nonobst, OM1 40%, OM2 30%, RCA- no dz. chronic calcified LAD --> no intervention.  b. (06/2010) NSTEMI s/p cath showing same findings   . Ischemic cardiomyopathy   . Orthostatic hypotension     a. sycopal episode in 12/11   . Diabetes mellitus     a. Insulin dependant Type I   . Hyperlipidemia   . Diabetic nephropathy (HCC)   . Diabetic retinopathy   . Gastroparesis   . Depression   . Systolic heart failure     a. (08/2012) EF: 40% Mod hypokin in mid-dist-ant-sept myocard--> psuedonml L vent fill pattern, Grd 2 diast dyscfxn  . Iron deficiency anemia   . Hx MRSA infection   . Osteopenia   . Esophagitis   . NSTEMI (non-ST elevated myocardial infarction) (HCC)     a. (03/2010) NSTEMI during admission for DKA  b.  . Stroke [facility])     a. (2012) residual R hand/arm weakness  b. while on ASA and plavix  . Diabetic neuropathy [facility])     Past Surgical History  Procedure Laterality Date  . Appendectomy  1980  . Tubal ligation  1987  . Cesarean section   1984  . Refractive surgery      x3    Family History: Family History  Problem Relation Age of Onset  . Pancreatic cancer Father   . Colon cancer Neg Hx   . Diabetes Maternal Grandfather     Social History:   reports that she has never smoked. She has never used smokeless tobacco. She reports that she does not drink alcohol or use illicit drugs.  Allergies:  Allergies  Allergen Reactions  . Erythromycin Other (See Comments)    GI upset  . Food Nausea Only    Green peas  . Metoclopramide Hcl Other (See Comments)     fatique,  depression     Medications:                                                                                                                         Current facility-administered medications:  .   stroke: mapping our early stages of recovery book, , Does not apply, Once, Calvert Cantor, MD .  aspirin EC tablet 81 mg, 81 mg, Oral, QHS, Saima Rizwan, MD .  carvedilol (COREG) tablet 6.25 mg, 6.25 mg, Oral, BID, Calvert Cantor, MD .  citalopram (CELEXA) tablet 20 mg, 20 mg, Oral, Daily, Saima Rizwan, MD .  clopidogrel (PLAVIX) tablet 75 mg, 75 mg, Oral, Daily, Saima Rizwan, MD .  digoxin (LANOXIN) tablet 0.0625 mg, 0.0625 mg, Oral, QODAY, Saima Rizwan, MD .  DULoxetine (CYMBALTA) DR capsule 30 mg, 30 mg, Oral, Daily, Saima Rizwan, MD .  enoxaparin (LOVENOX) injection 40 mg, 40 mg, Subcutaneous, Q24H, Saima Rizwan, MD .  ferrous sulfate tablet 650 mg, 650 mg, Oral, Q supper, Calvert Cantor, MD .  furosemide (LASIX) tablet 20 mg, 20 mg, Oral, Daily, Calvert Cantor, MD .  Melene Muller ON 05/23/2015] insulin aspart (novoLOG) injection 0-15 Units, 0-15 Units, Subcutaneous, TID WC, Saima Rizwan, MD .  insulin aspart (novoLOG) injection 0-5 Units, 0-5 Units, Subcutaneous, QHS, Saima Rizwan, MD .  insulin detemir (LEVEMIR) injection 18 Units, 18 Units, Subcutaneous, Daily, Saima Rizwan, MD .  rosuvastatin (CRESTOR) tablet 40 mg, 40 mg, Oral, Q supper, Calvert Cantor, MD  Current  outpatient prescriptions:  .  aspirin EC 81 MG tablet, Take 81 mg by mouth at bedtime. , Disp: , Rfl:  .  Calcium Carb-Cholecalciferol (CALCIUM 500 +D PO), Take 500 mg by mouth daily with lunch., Disp: , Rfl:  .  carvedilol (COREG) 6.25 MG tablet, Take 6.25 mg by mouth 2 (two) times daily., Disp: , Rfl:  .  citalopram (CELEXA) 20 MG tablet, Take 20 mg by mouth daily. , Disp: , Rfl:  .  clopidogrel (PLAVIX) 75 MG tablet, Take 1 tablet (75 mg total) by mouth daily., Disp: 30 tablet, Rfl: 6 .  digoxin (LANOXIN) 0.125 MG tablet, Take 0.0625 mg by mouth every other day., Disp: , Rfl:  .  DULoxetine (CYMBALTA) 30 MG capsule, Take 30 mg by mouth daily. , Disp: , Rfl:  .  ferrous sulfate 325 (65 FE) MG tablet, Take 650 mg by mouth daily with supper. , Disp: , Rfl:  .  furosemide (LASIX) 20 MG tablet, TAKE 1 TABLET (20 MG TOTAL) BY MOUTH DAILY., Disp: 30 tablet, Rfl: 6 .  Insulin Degludec (TRESIBA FLEXTOUCH) 200 UNIT/ML SOPN, Inject 20 Units into the skin daily after breakfast., Disp: , Rfl:  .  insulin lispro (HUMALOG KWIKPEN) 100 UNIT/ML KiwkPen, Inject 1-7 Units into the skin 3 (three) times daily as needed (CBG >150). Per sliding scale: CBG 151-200 1 unit, 201-250 2 units, 251-300 3 units, 301-350 5 units, >350 7 units, Disp: , Rfl:  .  LORazepam (ATIVAN) 0.5 MG tablet,  Take 0.5 mg by mouth See admin instructions. Take 1 tablet (0.5 mg) by mouth daily at bedtime, may also take 1 tablet (0.5 mg) in the morning as needed for anxiety, Disp: , Rfl:  .  Multiple Vitamin (MULTIVITAMIN WITH MINERALS) TABS tablet, Take 1 tablet by mouth daily with lunch., Disp: , Rfl:  .  rosuvastatin (CRESTOR) 40 MG tablet, Take 40 mg by mouth daily with supper. , Disp: 30 tablet, Rfl: 11 .  Vitamin D, Ergocalciferol, (DRISDOL) 50000 UNITS CAPS, Take 50,000 Units by mouth every 7 (seven) days. Saturday, Disp: , Rfl:    ROS:                                                                                                                                        History obtained from the patient  General ROS: negative for - chills, fatigue, fever, night sweats, weight gain or weight loss Psychological ROS: negative for - behavioral disorder, hallucinations, memory difficulties, mood swings or suicidal ideation Ophthalmic ROS: negative for - blurry vision,  eye pain or loss of vision ENT ROS: negative for - epistaxis, nasal discharge, oral lesions, sore throat, tinnitus or vertigo Allergy and Immunology ROS: negative for - hives or itchy/watery eyes Hematological and Lymphatic ROS: negative for - bleeding problems, bruising or swollen lymph nodes Endocrine ROS: negative for - galactorrhea, hair pattern changes, polydipsia/polyuria or temperature intolerance Respiratory ROS: negative for - cough, hemoptysis, shortness of breath or wheezing Cardiovascular ROS: negative for - chest pain, dyspnea on exertion, edema or irregular heartbeat Gastrointestinal ROS: negative for - abdominal pain, diarrhea, hematemesis, nausea/vomiting or stool incontinence Genito-Urinary ROS: negative for - dysuria, hematuria, incontinence or urinary frequency/urgency Musculoskeletal ROS: negative for - joint swelling or muscular weakness Neurological ROS: as noted in HPI Dermatological ROS: negative for rash and skin lesion changes  Neurologic Examination:                                                                                                      Blood pressure 127/65, pulse 70, temperature 98.1 F (36.7 C), temperature source Oral, resp. rate 14, height 5' 4.5" (1.638 m), weight 70.308 kg (155 lb), last menstrual period 03/13/2011, SpO2 98 %.  Evaluation of higher integrative functions including: Level of alertness: Alert,  Oriented to time, place and person Recent and remote memory - intact   Attention span and concentration  - intact   Speech: fluent, no evidence of dysarthria or aphasia noted.  Test  the following cranial nerves:  2-5, 7-12 grossly intact. Bilateral lateral rectus palsy is noted, worse on the left . Pupils equal and reactive, no ptosis , mild right facial weakness noted, likely upper motor neuron from prior stroke  Motor examination:Mild spastic on the right side, 4/5 with right deltoid , otherwise rest of the muscle groups full 5/5 motor strength in all 4 extremities Examination of sensation : symmetric sensation to pinprick in all 4 extremities and on face, with superimposed length dependent sensory loss from diabetic polyneuropathy  Examination of deep tendon reflexes: 1+, normal and symmetric in all extremities, normal plantars bilaterally Test coordination:  mild right upper extremity appendicular ataxia  with finger nose testing , normal on the left, no  abnormal involuntary movements or tremors noted.  Gait: Deferred   Lab Results: Basic Metabolic Panel:  Recent Labs Lab 05/22/15 1414  NA 145  K 4.0  CL 108  CO2 29  GLUCOSE 121*  BUN 19  CREATININE 1.13*  CALCIUM 9.3    Liver Function Tests: No results for input(s): AST, ALT, ALKPHOS, BILITOT, PROT, ALBUMIN in the last 168 hours. No results for input(s): LIPASE, AMYLASE in the last 168 hours. No results for input(s): AMMONIA in the last 168 hours.  CBC:  Recent Labs Lab 05/22/15 1414  WBC 7.4  NEUTROABS 4.5  HGB 14.3  HCT 43.3  MCV 91.5  PLT 220    Cardiac Enzymes: No results for input(s): CKTOTAL, CKMB, CKMBINDEX, TROPONINI in the last 168 hours.  Lipid Panel: No results for input(s): CHOL, TRIG, HDL, CHOLHDL, VLDL, LDLCALC in the last 168 hours.  CBG:  Recent Labs Lab 05/22/15 1625 05/22/15 1742  GLUCAP 66 130*    Microbiology: Results for orders placed or performed during the hospital encounter of 05/26/13  Urine culture     Status: None   Collection Time: 05/26/13  4:22 PM  Result Value Ref Range Status   Specimen Description URINE, CLEAN CATCH  Final   Special Requests NONE  Final   Culture  Setup  Time   Final    05/26/2013 19:21 Performed at [facility] Count   Final    >=100,000 COLONIES/ML Performed at [facility]   Culture   Final    ESCHERICHIA COLI Performed at [facility]   Report Status 05/28/2013 FINAL  Final   Organism ID, Bacteria ESCHERICHIA COLI  Final      Susceptibility   Escherichia coli - MIC*    AMPICILLIN <=2 SENSITIVE Sensitive     CEFAZOLIN <=4 SENSITIVE Sensitive     CEFTRIAXONE <=1 SENSITIVE Sensitive     CIPROFLOXACIN <=0.25 SENSITIVE Sensitive     GENTAMICIN <=1 SENSITIVE Sensitive     LEVOFLOXACIN <=0.12 SENSITIVE Sensitive     NITROFURANTOIN <=16 SENSITIVE Sensitive     TOBRAMYCIN <=1 SENSITIVE Sensitive     TRIMETH/SULFA <=20 SENSITIVE Sensitive     PIP/TAZO <=4 SENSITIVE Sensitive     * ESCHERICHIA COLI     Imaging: Ct Head Wo Contrast  05/22/2015  CLINICAL DATA:  Double vision at of left eye since yesterday. EXAM: CT HEAD WITHOUT CONTRAST TECHNIQUE: Contiguous axial images were obtained from the base of the skull through the vertex without intravenous contrast. COMPARISON:  05/26/2013. FINDINGS: There is no evidence for acute hemorrhage, hydrocephalus, mass lesion, or abnormal extra-axial fluid collection. No definite CT evidence for acute infarction. Patchy low attenuation in the deep hemispheric and periventricular white matter is nonspecific, but  likely reflects chronic microvascular ischemic demyelination. Diffuse loss of parenchymal volume is consistent with atrophy. The visualized paranasal sinuses and mastoid air cells are clear. IMPRESSION: Stable exam. Mild atrophy for age with chronic small vessel white matter ischemic demyelination. No acute intracranial abnormality. Electronically Signed   By: Kennith Center M.D.   On: 05/22/2015 15:25    Assessment and plan:   [name] is an 55 y.o. female patient who presented To the ER with binocular diplopia, noted by her since a fall yesterday. She does have  clinical evidence of bilateral lateral rectus palsy, worse on the left. The right lateral rectus palsy is minimal. I suspect the left lateral rectus palsy is acute contributing to the binocular diplopia. She also has mild right facial as well as right deltoid weakness which he reports to be chronic since her last stroke in 2012. Recommend further neurodiagnostic workup with a CT angiogram of the head and neck, an MRI of the brain. If these studies are negative for an acute stroke, then the left sixth nerve palsy is most likely secondary to diabetic cranial neuropathy. She needs physical therapy evaluation for falls prevention. Continue her home medications including aspirin, Plavix, statin  Discussed with ER physician, and admitting hospitalist.

## 2015-05-22 NOTE — ED Notes (Signed)
The pt reports that she was seeing double in her lt eye since yesterday  She walks with a walker at home since herf stroke in 2012  That left her with rt sided weakness

## 2015-05-22 NOTE — ED Notes (Signed)
Pt asking  For nausea meds

## 2015-05-23 ENCOUNTER — Observation Stay (HOSPITAL_COMMUNITY): Payer: Medicare Other

## 2015-05-23 ENCOUNTER — Encounter (HOSPITAL_COMMUNITY): Payer: Self-pay | Admitting: Radiology

## 2015-05-23 DIAGNOSIS — I639 Cerebral infarction, unspecified: Secondary | ICD-10-CM | POA: Diagnosis not present

## 2015-05-23 DIAGNOSIS — I6350 Cerebral infarction due to unspecified occlusion or stenosis of unspecified cerebral artery: Secondary | ICD-10-CM | POA: Diagnosis not present

## 2015-05-23 DIAGNOSIS — H532 Diplopia: Secondary | ICD-10-CM | POA: Diagnosis present

## 2015-05-23 DIAGNOSIS — Z8614 Personal history of Methicillin resistant Staphylococcus aureus infection: Secondary | ICD-10-CM | POA: Diagnosis not present

## 2015-05-23 DIAGNOSIS — Z7982 Long term (current) use of aspirin: Secondary | ICD-10-CM | POA: Diagnosis not present

## 2015-05-23 DIAGNOSIS — E1142 Type 2 diabetes mellitus with diabetic polyneuropathy: Secondary | ICD-10-CM | POA: Diagnosis not present

## 2015-05-23 DIAGNOSIS — I63332 Cerebral infarction due to thrombosis of left posterior cerebral artery: Secondary | ICD-10-CM | POA: Diagnosis not present

## 2015-05-23 DIAGNOSIS — I6302 Cerebral infarction due to thrombosis of basilar artery: Secondary | ICD-10-CM | POA: Diagnosis present

## 2015-05-23 DIAGNOSIS — Z66 Do not resuscitate: Secondary | ICD-10-CM | POA: Diagnosis present

## 2015-05-23 DIAGNOSIS — I69351 Hemiplegia and hemiparesis following cerebral infarction affecting right dominant side: Secondary | ICD-10-CM | POA: Diagnosis not present

## 2015-05-23 DIAGNOSIS — I69359 Hemiplegia and hemiparesis following cerebral infarction affecting unspecified side: Secondary | ICD-10-CM | POA: Insufficient documentation

## 2015-05-23 DIAGNOSIS — Z79899 Other long term (current) drug therapy: Secondary | ICD-10-CM | POA: Diagnosis not present

## 2015-05-23 DIAGNOSIS — D509 Iron deficiency anemia, unspecified: Secondary | ICD-10-CM | POA: Diagnosis present

## 2015-05-23 DIAGNOSIS — I5022 Chronic systolic (congestive) heart failure: Secondary | ICD-10-CM | POA: Diagnosis present

## 2015-05-23 DIAGNOSIS — Z7902 Long term (current) use of antithrombotics/antiplatelets: Secondary | ICD-10-CM | POA: Diagnosis not present

## 2015-05-23 DIAGNOSIS — Z794 Long term (current) use of insulin: Secondary | ICD-10-CM | POA: Diagnosis not present

## 2015-05-23 DIAGNOSIS — I6930 Unspecified sequelae of cerebral infarction: Secondary | ICD-10-CM | POA: Insufficient documentation

## 2015-05-23 DIAGNOSIS — E10319 Type 1 diabetes mellitus with unspecified diabetic retinopathy without macular edema: Secondary | ICD-10-CM | POA: Diagnosis present

## 2015-05-23 DIAGNOSIS — Z91018 Allergy to other foods: Secondary | ICD-10-CM | POA: Diagnosis not present

## 2015-05-23 DIAGNOSIS — G8191 Hemiplegia, unspecified affecting right dominant side: Secondary | ICD-10-CM | POA: Diagnosis not present

## 2015-05-23 DIAGNOSIS — E1043 Type 1 diabetes mellitus with diabetic autonomic (poly)neuropathy: Secondary | ICD-10-CM | POA: Diagnosis present

## 2015-05-23 DIAGNOSIS — I6789 Other cerebrovascular disease: Secondary | ICD-10-CM | POA: Diagnosis not present

## 2015-05-23 DIAGNOSIS — F329 Major depressive disorder, single episode, unspecified: Secondary | ICD-10-CM | POA: Diagnosis present

## 2015-05-23 DIAGNOSIS — E1051 Type 1 diabetes mellitus with diabetic peripheral angiopathy without gangrene: Secondary | ICD-10-CM | POA: Diagnosis not present

## 2015-05-23 DIAGNOSIS — E875 Hyperkalemia: Secondary | ICD-10-CM | POA: Diagnosis present

## 2015-05-23 DIAGNOSIS — Z881 Allergy status to other antibiotic agents status: Secondary | ICD-10-CM | POA: Diagnosis not present

## 2015-05-23 DIAGNOSIS — I10 Essential (primary) hypertension: Secondary | ICD-10-CM | POA: Diagnosis present

## 2015-05-23 DIAGNOSIS — I252 Old myocardial infarction: Secondary | ICD-10-CM | POA: Diagnosis not present

## 2015-05-23 DIAGNOSIS — E785 Hyperlipidemia, unspecified: Secondary | ICD-10-CM | POA: Diagnosis present

## 2015-05-23 DIAGNOSIS — E1059 Type 1 diabetes mellitus with other circulatory complications: Secondary | ICD-10-CM | POA: Insufficient documentation

## 2015-05-23 DIAGNOSIS — I959 Hypotension, unspecified: Secondary | ICD-10-CM | POA: Diagnosis present

## 2015-05-23 DIAGNOSIS — G839 Paralytic syndrome, unspecified: Secondary | ICD-10-CM | POA: Diagnosis present

## 2015-05-23 DIAGNOSIS — E1065 Type 1 diabetes mellitus with hyperglycemia: Secondary | ICD-10-CM | POA: Diagnosis not present

## 2015-05-23 DIAGNOSIS — Z888 Allergy status to other drugs, medicaments and biological substances status: Secondary | ICD-10-CM | POA: Diagnosis not present

## 2015-05-23 DIAGNOSIS — E1021 Type 1 diabetes mellitus with diabetic nephropathy: Secondary | ICD-10-CM | POA: Diagnosis present

## 2015-05-23 DIAGNOSIS — E1042 Type 1 diabetes mellitus with diabetic polyneuropathy: Secondary | ICD-10-CM | POA: Diagnosis present

## 2015-05-23 DIAGNOSIS — K3184 Gastroparesis: Secondary | ICD-10-CM | POA: Diagnosis present

## 2015-05-23 DIAGNOSIS — I6330 Cerebral infarction due to thrombosis of unspecified cerebral artery: Secondary | ICD-10-CM | POA: Diagnosis not present

## 2015-05-23 DIAGNOSIS — I2510 Atherosclerotic heart disease of native coronary artery without angina pectoris: Secondary | ICD-10-CM | POA: Diagnosis present

## 2015-05-23 DIAGNOSIS — K219 Gastro-esophageal reflux disease without esophagitis: Secondary | ICD-10-CM | POA: Diagnosis present

## 2015-05-23 DIAGNOSIS — I255 Ischemic cardiomyopathy: Secondary | ICD-10-CM | POA: Diagnosis present

## 2015-05-23 DIAGNOSIS — E119 Type 2 diabetes mellitus without complications: Secondary | ICD-10-CM | POA: Diagnosis not present

## 2015-05-23 LAB — GLUCOSE, CAPILLARY
GLUCOSE-CAPILLARY: 26 mg/dL — AB (ref 65–99)
GLUCOSE-CAPILLARY: 110 mg/dL — AB (ref 65–99)
GLUCOSE-CAPILLARY: 192 mg/dL — AB (ref 65–99)
GLUCOSE-CAPILLARY: 268 mg/dL — AB (ref 65–99)
Glucose-Capillary: 279 mg/dL — ABNORMAL HIGH (ref 65–99)

## 2015-05-23 LAB — CBC
HEMATOCRIT: 45.2 % (ref 36.0–46.0)
HEMOGLOBIN: 15.4 g/dL — AB (ref 12.0–15.0)
MCH: 31 pg (ref 26.0–34.0)
MCHC: 34.1 g/dL (ref 30.0–36.0)
MCV: 91.1 fL (ref 78.0–100.0)
Platelets: 236 10*3/uL (ref 150–400)
RBC: 4.96 MIL/uL (ref 3.87–5.11)
RDW: 13.1 % (ref 11.5–15.5)
WBC: 7.7 10*3/uL (ref 4.0–10.5)

## 2015-05-23 LAB — COMPREHENSIVE METABOLIC PANEL
ALT: 18 U/L (ref 14–54)
ANION GAP: 12 (ref 5–15)
AST: 22 U/L (ref 15–41)
Albumin: 3.5 g/dL (ref 3.5–5.0)
Alkaline Phosphatase: 56 U/L (ref 38–126)
BUN: 18 mg/dL (ref 6–20)
CHLORIDE: 107 mmol/L (ref 101–111)
CO2: 24 mmol/L (ref 22–32)
CREATININE: 1.12 mg/dL — AB (ref 0.44–1.00)
Calcium: 9.7 mg/dL (ref 8.9–10.3)
GFR, EST NON AFRICAN AMERICAN: 54 mL/min — AB (ref >60)
Glucose, Bld: 204 mg/dL — ABNORMAL HIGH (ref 65–99)
POTASSIUM: 4.2 mmol/L (ref 3.5–5.1)
Sodium: 143 mmol/L (ref 135–145)
Total Bilirubin: 0.7 mg/dL (ref 0.3–1.2)
Total Protein: 6.8 g/dL (ref 6.5–8.1)

## 2015-05-23 LAB — LIPID PANEL
CHOL/HDL RATIO: 2.8 ratio
CHOLESTEROL: 126 mg/dL (ref 0–200)
HDL: 45 mg/dL (ref >40)
LDL CALC: 58 mg/dL (ref 0–99)
TRIGLYCERIDES: 115 mg/dL (ref <150)
VLDL: 23 mg/dL (ref 0–40)

## 2015-05-23 LAB — LACTIC ACID, PLASMA
LACTIC ACID, VENOUS: 1.9 mmol/L (ref 0.5–2.0)
LACTIC ACID, VENOUS: 1.4 mmol/L (ref 0.5–2.0)

## 2015-05-23 MED ORDER — INSULIN DETEMIR 100 UNIT/ML ~~LOC~~ SOLN
8.0000 [IU] | Freq: Every day | SUBCUTANEOUS | Status: DC
  Administered 2015-05-24: 8 [IU] via SUBCUTANEOUS
  Filled 2015-05-23: qty 0.08

## 2015-05-23 MED ORDER — GADOBENATE DIMEGLUMINE 529 MG/ML IV SOLN
16.0000 mL | Freq: Once | INTRAVENOUS | Status: AC | PRN
  Administered 2015-05-23: 16 mL via INTRAVENOUS

## 2015-05-23 MED ORDER — IOHEXOL 350 MG/ML SOLN
50.0000 mL | Freq: Once | INTRAVENOUS | Status: AC | PRN
  Administered 2015-05-23: 50 mL via INTRAVENOUS

## 2015-05-23 MED ORDER — CARVEDILOL 6.25 MG PO TABS
6.2500 mg | ORAL_TABLET | Freq: Two times a day (BID) | ORAL | Status: DC
  Administered 2015-05-23 – 2015-05-25 (×3): 6.25 mg via ORAL
  Filled 2015-05-23 (×3): qty 1

## 2015-05-23 MED ORDER — DEXTROSE 50 % IV SOLN
25.0000 mL | Freq: Once | INTRAVENOUS | Status: AC
  Administered 2015-05-23: 25 mL via INTRAVENOUS

## 2015-05-23 NOTE — Progress Notes (Signed)
STROKE TEAM PROGRESS NOTE   HISTORY OF PRESENT ILLNESS [name] is an 55 y.o. female patient who presented to ER with some symptoms of double vision. She fell yesterday. Reports noticing double vision after the fall.. CT of the head negative in ER. History of child onset diabetes, prior stroke with some residual right-sided weakness. Denies any new weakness. she was last known well 05/21/2015, time unknown. Patient was not administered TPA secondary to outside time window. She was admitted for further evaluation and treatment.   SUBJECTIVE (INTERVAL HISTORY) Her therapist is at the bedside.  Overall she feels her condition is stable. She still has binocular diplopia especially looking at distance. Will give eye patch. Pt stated that she has 48 years of hx of DM.    OBJECTIVE Temp:  [97.3 F (36.3 C)-100.6 F (38.1 C)] 97.3 F (36.3 C) (01/30 0657) Pulse Rate:  [60-115] 70 (01/30 0657) Cardiac Rhythm:  [-] Normal sinus rhythm (01/30 0700) Resp:  [13-24] 18 (01/30 0657) BP: (98-155)/(49-91) 124/70 mmHg (01/30 0657) SpO2:  [97 %-100 %] 98 % (01/30 0657) Weight:  [70.308 kg (155 lb)-70.5 kg (155 lb 6.8 oz)] 70.5 kg (155 lb 6.8 oz) (01/29 1950)  CBC:   Recent Labs Lab 05/22/15 1414 05/23/15 0815  WBC 7.4 7.7  NEUTROABS 4.5  --   HGB 14.3 15.4*  HCT 43.3 45.2  MCV 91.5 91.1  PLT 220 236    Basic Metabolic Panel:   Recent Labs Lab 05/22/15 1414 05/23/15 0815  NA 145 143  K 4.0 4.2  CL 108 107  CO2 29 24  GLUCOSE 121* 204*  BUN 19 18  CREATININE 1.13* 1.12*  CALCIUM 9.3 9.7    Lipid Panel:     Component Value Date/Time   CHOL 126 05/23/2015 0500   TRIG 115 05/23/2015 0500   HDL 45 05/23/2015 0500   CHOLHDL 2.8 05/23/2015 0500   VLDL 23 05/23/2015 0500   LDLCALC 58 05/23/2015 0500   HgbA1c:  Lab Results  Component Value Date   HGBA1C 13.2* 03/23/2013   Urine Drug Screen:     Component Value Date/Time   LABOPIA NONE DETECTED 02/14/2013 1948   COCAINSCRNUR  NONE DETECTED 02/14/2013 1948   LABBENZ POSITIVE* 02/14/2013 1948   AMPHETMU NONE DETECTED 02/14/2013 1948   THCU NONE DETECTED 02/14/2013 1948   LABBARB NONE DETECTED 02/14/2013 1948      IMAGING I have personally reviewed the radiological images below and agree with the radiology interpretations.  Ct Head Wo Contrast 05/22/2015  Stable exam. Mild atrophy for age with chronic small vessel white matter ischemic demyelination. No acute intracranial abnormality.   Mr [name] Contrast 05/23/2015  Sequences are motion degraded. Patient was not able to complete post-contrast coronal imaging. Acute small nonhemorrhagic infarcts left paracentral midbrain and posterior left paracentral pons. Remote small infarcts scattered throughout the centrum semiovale/coronal radiata bilaterally. Remote thalamic infarcts. Chronic small vessel disease changes. Global atrophy without hydrocephalus. No intracranial mass or abnormal enhancement noted on motion degraded exam. Partial opacification right mastoid air cells without obstructing lesion of eustachian tube noted. Minimal mucosal thickening ethmoid sinus air cells and inferior maxillary sinuses.   CTA head 05/23/2015 1. No major intracranial arterial occlusion. 2. Mild intracranial atherosclerosis with mild right ICA stenosis.  CUS - pending  TTE - pending  PHYSICAL EXAM  Temp:  [97.3 F (36.3 C)-100.6 F (38.1 C)] 98.4 F (36.9 C) (01/30 1444) Pulse Rate:  [60-115] 73 (01/30 1444) Resp:  [14-18]  15 (01/30 1444) BP: (98-152)/(49-91) 142/62 mmHg (01/30 1444) SpO2:  [97 %-100 %] 99 % (01/30 1444) Weight:  [155 lb 6.8 oz (70.5 kg)] 155 lb 6.8 oz (70.5 kg) (01/29 1950)  General - Well nourished, well developed, in no apparent distress.  Ophthalmologic - Fundi not visualized due to eye movement.  Cardiovascular - Regular rate and rhythm with no murmur.  Mental Status -  Level of arousal and orientation to time, place, and person were  intact. Language including expression, naming, repetition, comprehension was assessed and found intact. Fund of Knowledge was assessed and was intact.  Cranial Nerves II - XII - II - Visual field intact OU. III, IV, VI - Extraocular movements no significant deficit on horizontal or vertical movement bilaterally, however, pt does complain of diplopia on distal looking. V - Facial sensation intact bilaterally. VII - Facial movement intact bilaterally. VIII - Hearing & vestibular intact bilaterally. X - Palate elevates symmetrically. XI - Chin turning & shoulder shrug intact bilaterally. XII - Tongue protrusion intact.  Motor Strength - The patient's strength was normal in all extremities except right UE proximal 5-/5, distal 4/5, with dexterity difficulty and pronator drift on the right.  Bulk was normal and fasciculations were absent.   Motor Tone - Muscle tone was assessed at the neck and appendages and was normal.  Reflexes - The patient's reflexes were 1+ in all extremities and she had no pathological reflexes.  Sensory - Light touch, temperature/pinprick were assessed and were symmetrical.    Coordination - The patient had normal movements in the hands and feet with no ataxia or dysmetria.  Tremor was absent.  Gait and Station - deferred to PT/OT in room   ASSESSMENT/PLAN Ms. [name] is a 55 y.o. 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 She did not receive IV t-PA due to delay in arrival.   Stroke:  left paracentral midbrain and posterior L paracentral pontine infarct secondary to  small vessel disease source.  Resultant  Double vision (new), R UE paresis (old)  MRI   left paracentral midbrain and posterior L paracentral pontine infarct   CTA head  No major stenosis  Carotid Doppler  pending  2D Echo  pending  LDL 58  HgbA1c pending  Lovenox  40 mg sq daily for VTE prophylaxis Diet Heart Room service appropriate?: Yes; Fluid consistency:: Thin  aspirin 81 mg daily and clopidogrel 75 mg daily prior to admission, now on aspirin 81 mg daily and clopidogrel 75 mg daily. Continue dual antiplatelet for stroke and cardiac prevention.  Patient counseled to be compliant with her antithrombotic medications  Ongoing aggressive stroke risk factor management  Therapy recommendations:  pending   Disposition:  pending   Hypertension  Stable Permissive hypertension (OK if <220/120) for 24-48 hours post stroke and then gradually normalized within 5-7 days.  Hyperlipidemia  Home meds:  Crestor 40, resumed in hospital  LDL 58, at goal < 70  Resumed crestor 40  Continue statin at discharge  Diabetes type I Diabetic Neuropathy Diabetic reinopathy  HgbA1c pending , goal < 7.0  One time hypoglycemia at 26, recommended dose adjustment  Followed by Dr. Evlyn Kanner  [facility]  On levemir  Other Stroke Risk Factors  Hx stroke/TIA  08/2010 small L parietal infarct w/ resultant R hand/arm weakness  Coronary artery disease - MI 2011 and 2012  Other Active Problems  Chronic systolic HF  Hospital day # 1  Marvel Plan, MD PhD Stroke Neurology 05/23/2015 6:22 PM   To contact Stroke Continuity provider, please refer to [email or web address]. After hours, contact General Neurology

## 2015-05-23 NOTE — Consult Note (Signed)
Physical Medicine and Rehabilitation Consult Reason for Consult: Nonhemorrhagic infarct left paracentral midbrain and posterior left paracentral pons. Referring Physician: [facility]    HPI: [name] is a 55 y.o. right handed female with history of hypertension, chronic systolic heart failure, CAD with ischemic cardiomyopathy, hyperlipidemia, history of cva, diabetes mellitus and peripheral neuropathy. Patient lives with parent. One level home with ramped entrance. Mother is 64 years old and limited assistance. Patient uses a rolling walker prior to admission. Presented 05/22/2015 with double vision decrease in balance and recent fall while using a walker. MRI of the brain showed acute small nonhemorrhagic infarct left paracentral midbrain and posterior left paracentral pons. Remote small infarcts scattered throughout the centrum semiovale ovale, remote thalamic infarct. Chronic small vessel disease changes. CT angiogram head showed no major intracranial arterial occlusion. Echocardiogram pending. Patient did not receive TPA. Neurology services consulted maintain on aspirin and Plavix for CVA prophylaxis. Subcutaneous Lovenox for DVT prophylaxis. Maintain on regular diet consistency.   Review of Systems  Constitutional: Negative for fever and chills.  HENT: Negative for hearing loss.   Eyes: Positive for double vision. Negative for blurred vision.  Respiratory: Negative for cough and shortness of breath.   Cardiovascular: Negative for chest pain and palpitations.  Gastrointestinal:       GERD  Genitourinary: Negative for dysuria and hematuria.  Musculoskeletal: Positive for myalgias, joint pain and falls.  Skin: Negative for rash.  Neurological: Positive for dizziness. Negative for seizures, loss of consciousness, weakness and headaches.  Psychiatric/Behavioral: Positive for depression.       Anxiety  All other systems reviewed and are negative.  Past Medical History  Diagnosis Date    . Hypertension   . Vitamin D deficiency   . Coronary artery disease     a. (12/11) NSTEMI, in the setting of DKA, s/p Cath--LAD totalled in the midportion, diag 70% sten (small 2-mm ), LCx nonobst, OM1 40%, OM2 30%, RCA- no dz. chronic calcified LAD --> no intervention.  b. (06/2010) NSTEMI s/p cath showing same findings   . Ischemic cardiomyopathy   . Orthostatic hypotension     a. sycopal episode in 12/11   . Diabetes mellitus     a. Insulin dependant Type I   . Hyperlipidemia   . Diabetic nephropathy (HCC)   . Diabetic retinopathy   . Gastroparesis   . Depression   . Systolic heart failure     a. (08/2012) EF: 40% Mod hypokin in mid-dist-ant-sept myocard--> psuedonml L vent fill pattern, Grd 2 diast dyscfxn  . Iron deficiency anemia   . Hx MRSA infection   . Osteopenia   . Esophagitis   . NSTEMI (non-ST elevated myocardial infarction) (HCC)     a. (03/2010) NSTEMI during admission for DKA  b.  . Stroke [facility])     a. (2012) residual R hand/arm weakness  b. while on ASA and plavix  . Diabetic neuropathy [facility])    Past Surgical History  Procedure Laterality Date  . Appendectomy  1980  . Tubal ligation  1987  . Cesarean section  1984  . Refractive surgery      x3   Family History  Problem Relation Age of Onset  . Pancreatic cancer Father   . Colon cancer Neg Hx   . Diabetes Maternal Grandfather    Social History:  reports that she has never smoked. She has never used smokeless tobacco. She reports that she does not drink alcohol or use illicit  drugs. Allergies:  Allergies  Allergen Reactions  . Erythromycin Other (See Comments)    GI upset  . Food Nausea Only    Green peas  . Metoclopramide Hcl Other (See Comments)     fatique, depression   Medications Prior to Admission  Medication Sig Dispense Refill  . aspirin EC 81 MG tablet Take 81 mg by mouth at bedtime.     . Calcium Carb-Cholecalciferol (CALCIUM 500 +D PO) Take 500 mg by mouth daily with lunch.    .  carvedilol (COREG) 6.25 MG tablet Take 6.25 mg by mouth 2 (two) times daily.    . citalopram (CELEXA) 20 MG tablet Take 20 mg by mouth daily.     . clopidogrel (PLAVIX) 75 MG tablet Take 1 tablet (75 mg total) by mouth daily. 30 tablet 6  . digoxin (LANOXIN) 0.125 MG tablet Take 0.0625 mg by mouth every other day.    . DULoxetine (CYMBALTA) 30 MG capsule Take 30 mg by mouth daily.     . ferrous sulfate 325 (65 FE) MG tablet Take 650 mg by mouth daily with supper.     . furosemide (LASIX) 20 MG tablet TAKE 1 TABLET (20 MG TOTAL) BY MOUTH DAILY. 30 tablet 6  . Insulin Degludec (TRESIBA FLEXTOUCH) 200 UNIT/ML SOPN Inject 20 Units into the skin daily after breakfast.    . insulin lispro (HUMALOG KWIKPEN) 100 UNIT/ML KiwkPen Inject 1-7 Units into the skin 3 (three) times daily as needed (CBG >150). Per sliding scale: CBG 151-200 1 unit, 201-250 2 units, 251-300 3 units, 301-350 5 units, >350 7 units    . LORazepam (ATIVAN) 0.5 MG tablet Take 0.5 mg by mouth See admin instructions. Take 1 tablet (0.5 mg) by mouth daily at bedtime, may also take 1 tablet (0.5 mg) in the morning as needed for anxiety    . Multiple Vitamin (MULTIVITAMIN WITH MINERALS) TABS tablet Take 1 tablet by mouth daily with lunch.    . rosuvastatin (CRESTOR) 40 MG tablet Take 40 mg by mouth daily with supper.  30 tablet 11  . Vitamin D, Ergocalciferol, (DRISDOL) 50000 UNITS CAPS Take 50,000 Units by mouth every 7 (seven) days. Saturday      Home: Home Living Family/patient expects to be discharged to:: Private residence Living Arrangements: Parent Available Help at Discharge: Family, Available 24 hours/day Type of Home: House Home Access: Ramped entrance Home Layout: One level Bathroom Shower/Tub: Tub/shower unit, Engineer, building services: Standard Bathroom Accessibility: Yes Home Equipment: Environmental consultant - 2 wheels, Cane - quad, Shower seat Additional Comments: pt's mother is 3 yo, is independent and does the driving, though she  recently had a CABG. Pt reports her mother drives and does all the cooking while she helps with cleaning of home.  Functional History: Prior Function Level of Independence: Independent with assistive device(s) Comments: Pt reports ambulation with quad cane or RW at baseline, shower seat for bathing, R sided weakness Functional Status:  Mobility: Bed Mobility Overal bed mobility: Modified Independent General bed mobility comments: seated in recliner chair upon entering the room Transfers Overall transfer level: Needs assistance Equipment used: Rolling walker (2 wheeled) Transfers: Sit to/from Stand Sit to Stand: Min assist General transfer comment: Min A to stand from recliner chair with min verbal cues for hand placement and technique Ambulation/Gait Ambulation/Gait assistance: Min assist Ambulation Distance (Feet): 100 Feet Assistive device: Rolling walker (2 wheeled) Gait Pattern/deviations: Decreased step length - right General Gait Details: Gauze taped over left eye for session which  corrected diplopia. pt drifts right, vc's needed to correct. Multiple posterior LOB with min A to correct, noted right knee weakness Gait velocity: decreased Gait velocity interpretation: <1.8 ft/sec, indicative of risk for recurrent falls    ADL: ADL Overall ADL's : Needs assistance/impaired Eating/Feeding: Set up Eating/Feeding Details (indicate cue type and reason): opening containers and cutting food Grooming: Oral care, Supervision/safety, Standing Grooming Details (indicate cue type and reason): Pt requiring min cues for initiation and sequencing of tasks. Pt needed min A for standing balance at sink side as she exhibits R lean in standing.  Lower Body Dressing: Min guard, Sitting/lateral leans, Sit to/from stand Lower Body Dressing Details (indicate cue type and reason): socks only this session. Pt seated and able to don/doff B socks with increased time and steady assist for balance.    General ADL Comments: Pt with decreased safety awareness this session. L eye occluded for double vision concerns with ambulation. No c/o nausea with movement. Pt standing at sink for grooming tasks with R lateral lean and min A needed for standing balance with functional task.  Cognition: Cognition Overall Cognitive Status: Impaired/Different from baseline Orientation Level: Oriented X4 Cognition Arousal/Alertness: Awake/alert Behavior During Therapy: WFL for tasks assessed/performed Overall Cognitive Status: Impaired/Different from baseline Area of Impairment: Problem solving, Safety/judgement Safety/Judgement: Decreased awareness of safety Problem Solving: Slow processing, Requires verbal cues, Difficulty sequencing, Decreased initiation General Comments: unsure if processing is baseline  Blood pressure 142/62, pulse 73, temperature 98.4 F (36.9 C), temperature source Oral, resp. rate 15, height  (1.626 m), weight 70.5 kg (155 lb 6.8 oz), last menstrual period 03/13/2011, SpO2 99 %. Physical Exam  Vitals reviewed. Constitutional: She is oriented to person, place, and time. She appears well-developed and well-nourished.  HENT:  Head: Normocephalic and atraumatic.  Eyes: Conjunctivae and EOM are normal.  Pupils round and reactive to light  Neck: Normal range of motion. Neck supple. No thyromegaly present.  Cardiovascular: Normal rate and regular rhythm.   Respiratory: Effort normal and breath sounds normal. No respiratory distress.  GI: Soft. Bowel sounds are normal. She exhibits no distension.  Musculoskeletal: She exhibits no edema or tenderness.  Neurological: She is alert and oriented to person, place, and time.  Makes good eye contact with examiner.  Follows simple commands.  Fair awareness of deficits. Motor:LUE/LLE 5/5 proximal to distal RUE/RLE: 4+/5 proximal to distal Sensation intact to light touch 3+ DTRs RUE/RLE  Skin: Skin is warm and dry.  Psychiatric: She  has a normal mood and affect. Her behavior is normal.    Results for orders placed or performed during the hospital encounter of 05/22/15 (from the past 24 hour(s))  CBG monitoring, ED     Status: None   Collection Time: 05/22/15  4:25 PM  Result Value Ref Range   Glucose-Capillary 66 65 - 99 mg/dL  CBG monitoring, ED     Status: Abnormal   Collection Time: 05/22/15  5:42 PM  Result Value Ref Range   Glucose-Capillary 130 (H) 65 - 99 mg/dL  Glucose, capillary     Status: Abnormal   Collection Time: 05/22/15  8:03 PM  Result Value Ref Range   Glucose-Capillary 129 (H) 65 - 99 mg/dL   Comment 1 Notify RN    Comment 2 Document in Chart   Lipid panel     Status: None   Collection Time: 05/23/15  5:00 AM  Result Value Ref Range   Cholesterol 126 0 - 200 mg/dL   Triglycerides 161 <  150 mg/dL   HDL 45 >29 mg/dL   Total CHOL/HDL Ratio 2.8 RATIO   VLDL 23 0 - 40 mg/dL   LDL Cholesterol 58 0 - 99 mg/dL  Glucose, capillary     Status: Abnormal   Collection Time: 05/23/15  5:22 AM  Result Value Ref Range   Glucose-Capillary 26 (LL) 65 - 99 mg/dL  Glucose, capillary     Status: Abnormal   Collection Time: 05/23/15  5:45 AM  Result Value Ref Range   Glucose-Capillary 110 (H) 65 - 99 mg/dL  CBC     Status: Abnormal   Collection Time: 05/23/15  8:15 AM  Result Value Ref Range   WBC 7.7 4.0 - 10.5 K/uL   RBC 4.96 3.87 - 5.11 MIL/uL   Hemoglobin 15.4 (H) 12.0 - 15.0 g/dL   HCT 56.2 13.0 - 86.5 %   MCV 91.1 78.0 - 100.0 fL   MCH 31.0 26.0 - 34.0 pg   MCHC 34.1 30.0 - 36.0 g/dL   RDW 78.4 69.6 - 29.5 %   Platelets 236 150 - 400 K/uL  Comprehensive metabolic panel     Status: Abnormal   Collection Time: 05/23/15  8:15 AM  Result Value Ref Range   Sodium 143 135 - 145 mmol/L   Potassium 4.2 3.5 - 5.1 mmol/L   Chloride 107 101 - 111 mmol/L   CO2 24 22 - 32 mmol/L   Glucose, Bld 204 (H) 65 - 99 mg/dL   BUN 18 6 - 20 mg/dL   Creatinine, Ser 2.84 (H) 0.44 - 1.00 mg/dL   Calcium 9.7 8.9  - 13.2 mg/dL   Total Protein 6.8 6.5 - 8.1 g/dL   Albumin 3.5 3.5 - 5.0 g/dL   AST 22 15 - 41 U/L   ALT 18 14 - 54 U/L   Alkaline Phosphatase 56 38 - 126 U/L   Total Bilirubin 0.7 0.3 - 1.2 mg/dL   GFR calc non Af Amer 54 (L) >60 mL/min   GFR calc Af Amer >60 >60 mL/min   Anion gap 12 5 - 15  Lactic acid, plasma     Status: None   Collection Time: 05/23/15  8:15 AM  Result Value Ref Range   Lactic Acid, Venous 1.9 0.5 - 2.0 mmol/L  Lactic acid, plasma     Status: None   Collection Time: 05/23/15 11:50 AM  Result Value Ref Range   Lactic Acid, Venous 1.4 0.5 - 2.0 mmol/L  Glucose, capillary     Status: Abnormal   Collection Time: 05/23/15 12:21 PM  Result Value Ref Range   Glucose-Capillary 192 (H) 65 - 99 mg/dL   Comment 1 Notify RN    Comment 2 Document in Chart    Ct Angio Head W/cm &/or Wo Cm  05/23/2015  CLINICAL DATA:  Diplopia. Generalized weakness and blurred vision. Small acute infarcts in the midbrain and pons on brain MRI. EXAM: CT ANGIOGRAPHY HEAD TECHNIQUE: Multidetector CT imaging of the head was performed using the standard protocol during bolus administration of intravenous contrast. Multiplanar CT image reconstructions and MIPs were obtained to evaluate the vascular anatomy. CONTRAST:  50mL OMNIPAQUE IOHEXOL 350 MG/ML SOLN COMPARISON:  Brain MRI earlier today FINDINGS: CTA HEAD Anterior circulation: Internal carotid arteries are patent from skullbase to carotid termini with mild atherosclerotic plaque bilaterally. There is mild right proximal supraclinoid ICA stenosis. ACAs and MCAs are patent without evidence of major branch occlusion or significant stenosis. No intracranial aneurysm is identified. Posterior circulation:  The visualized distal vertebral arteries are patent with the left being dominant. There is mild atherosclerotic irregularity of the left V4 segment without stenosis. AICA and SCA origins are patent. Basilar artery is patent without stenosis. There is a  medium-sized left posterior communicating artery with slight hypoplasia of the left P1 segment. There is also a tiny right posterior communicating artery. PCAs are patent with mild branch vessel irregularity but no significant proximal stenosis. Venous sinuses: Patent. Anatomic variants: None. Delayed phase: No abnormal enhancement. Small acute brainstem infarcts on MRI are not well seen by CT. Chronic small vessel ischemic changes are noted in the cerebral white matter bilaterally. Mildly advanced cerebral atrophy for age. IMPRESSION: 1. No major intracranial arterial occlusion. 2. Mild intracranial atherosclerosis with mild right ICA stenosis. Electronically Signed   By: Sebastian Ache M.D.   On: 05/23/2015 10:23   Dg Chest 2 View  05/23/2015  CLINICAL DATA:  Weakness .  Fever. EXAM: CHEST  2 VIEW COMPARISON:  09/15/2014 . FINDINGS: Mediastinum and hilar structures normal. Lungs are clear. Heart size normal. No pleural effusion or pneumothorax. Old right seventh rib fracture. Chest is stable from prior exam. IMPRESSION: No acute cardiopulmonary disease. Electronically Signed   By: Maisie Fus  Register   On: 05/23/2015 07:39   Ct Head Wo Contrast  05/22/2015  CLINICAL DATA:  Double vision at of left eye since yesterday. EXAM: CT HEAD WITHOUT CONTRAST TECHNIQUE: Contiguous axial images were obtained from the base of the skull through the vertex without intravenous contrast. COMPARISON:  05/26/2013. FINDINGS: There is no evidence for acute hemorrhage, hydrocephalus, mass lesion, or abnormal extra-axial fluid collection. No definite CT evidence for acute infarction. Patchy low attenuation in the deep hemispheric and periventricular white matter is nonspecific, but likely reflects chronic microvascular ischemic demyelination. Diffuse loss of parenchymal volume is consistent with atrophy. The visualized paranasal sinuses and mastoid air cells are clear. IMPRESSION: Stable exam. Mild atrophy for age with chronic small  vessel white matter ischemic demyelination. No acute intracranial abnormality. Electronically Signed   By: Kennith Center M.D.   On: 05/22/2015 15:25   Mr Laqueta Jean XB Contrast  05/23/2015  CLINICAL DATA:  55 year old hypertensive diabetic female with anemia and hyperlipidemia presenting with double vision. Recent fall. Subsequent encounter. EXAM: MRI HEAD WITHOUT AND WITH CONTRAST TECHNIQUE: Multiplanar, multiecho pulse sequences of the brain and surrounding structures were obtained without and with intravenous contrast. CONTRAST:  16mL MULTIHANCE GADOBENATE DIMEGLUMINE 529 MG/ML IV SOLN COMPARISON:  05/22/2015 CT.  08/28/2010 MR. FINDINGS: Sequences are motion degraded. Patient was not able to complete post-contrast coronal imaging. Acute small nonhemorrhagic infarcts left paracentral midbrain (extending to the inferior medial aspect left peri third ventricular region) and posterior left paracentral pons. Remote small infarcts scattered throughout the centrum semi ovale/coronal radiata bilaterally. Remote thalamic infarcts. Chronic small vessel disease changes. Global atrophy without hydrocephalus. Mega cisterna magna incidentally noted. No intracranial mass or abnormal enhancement noted on motion degraded exam. Major intracranial vascular structures are patent. Partial opacification right mastoid air cells without obstructing lesion of eustachian tube noted. Minimal mucosal thickening ethmoid sinus air cells and inferior maxillary sinuses. Minimal exophthalmos.  Appearance of prior lens replacement. Slightly heterogeneous bone marrow may be related to patient's habitus or underlying anemia IMPRESSION: Sequences are motion degraded. Patient was not able to complete post-contrast coronal imaging. Acute small nonhemorrhagic infarcts left paracentral midbrain and posterior left paracentral pons. Remote small infarcts scattered throughout the centrum semiovale/coronal radiata bilaterally. Remote thalamic infarcts.  Chronic small  vessel disease changes. Global atrophy without hydrocephalus. No intracranial mass or abnormal enhancement noted on motion degraded exam. Partial opacification right mastoid air cells without obstructing lesion of eustachian tube noted. Minimal mucosal thickening ethmoid sinus air cells and inferior maxillary sinuses. Electronically Signed   By: Lacy Duverney M.D.   On: 05/23/2015 10:02    Assessment/Plan: Diagnosis:  infarct left paracentral midbrain and posterior left paracentral pons Labs and images independently reviewed.  Records reviewed and summated above. Stroke: Continue secondary stroke prophylaxis and Risk Factor Modification listed below:   Antiplatelet therapy:  Blood Pressure Management:  Continue current medication with prn's with permisive HTN per primary team Statin Agent:   Diabetes management:   Right sided hemiparesis: fit for orthotics to prevent contractures (wrist cock up splint at night) Motor recovery: Fluoxetine  1. Does the need for close, 24 hr/day medical supervision in concert with the patient's rehab needs make it unreasonable for this patient to be served in a less intensive setting? Potentially 2. Co-Morbidities requiring supervision/potential complications: history of cva (with residual deficits), HTN (monitor and provide prns in accordance with increased physical exertion and pain), chronic systolic heart failure (Monitor in accordance with increased physical activity and avoid UE resistance excercises), CAD with ischemic cardiomyopathy (see CHF), hyperlipidemia (cont meds), diabetes mellitus and peripheral neuropathy (Monitor in accordance with exercise and adjust meds as necessary) 3. Due to safety, disease management and patient education, does the patient require 24 hr/day rehab nursing? Potentially 4. Does the patient require coordinated care of a physician, rehab nurse, PT (1-2 hrs/day, 5 days/week), OT (1-2 hrs/day, 5 days/week) and SLP (1-2  hrs/day, 5 days/week) to address physical and functional deficits in the context of the above medical diagnosis(es)? Potentially Addressing deficits in the following areas: balance, endurance, locomotion, strength, transferring, toileting and psychosocial support 5. Can the patient actively participate in an intensive therapy program of at least 3 hrs of therapy per day at least 5 days per week? Yes 6. The potential for patient to make measurable gains while on inpatient rehab is excellent 7. Anticipated functional outcomes upon discharge from inpatient rehab are modified independent and supervision  with PT, modified independent with OT, n/a with SLP. 8. Estimated rehab length of stay to reach the above functional goals is: 7-10 days. 9. Does the patient have adequate social supports and living environment to accommodate these discharge functional goals? Yes 10. Anticipated D/C setting: Home 11. Anticipated post D/C treatments: HH therapy and Home excercise program 12. Overall Rehab/Functional Prognosis: good  RECOMMENDATIONS: This patient's condition is appropriate for continued rehabilitative care in the following setting: CIR once medical workup complete (Echo pending)   Patient has agreed to participate in recommended program. Yes Note that insurance prior authorization may be required for reimbursement for recommended care.  Comment: Rehab Admissions Coordinator to follow up.  Maryla Morrow, MD 05/23/2015

## 2015-05-23 NOTE — Evaluation (Signed)
Physical Therapy Evaluation Patient Details Name: [name] MRN: [id number] DOB: 1961/01/20 Today's Date: 05/23/2015   History of Present Illness  Ms. [name] is a 55 y.o. female with history of chronic systolic heart failure, coronary artery disease being medically managed, CVA, type I diabetes mellitus on insulin and hyperlipidemia presenting with double vision and a fall. She did not receive IV t-PA due to delay in arrival. 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 Pt currently with functional limitations due to the deficits listed below (see PT Problem List). Pt ambulated 100' with RW, multiple LOB with min A to correct as well as right drift. Left eye covered during session which corrected vertical diplopia. Pt should be receiving eye patch soon. Pt will benefit from skilled PT to increase their independence and safety with mobility to allow discharge to the venue listed below.       Follow Up Recommendations CIR    Equipment Recommendations  None recommended by PT    Recommendations for Other Services Rehab consult     Precautions / Restrictions Precautions Precautions: Fall Restrictions Weight Bearing Restrictions: No      Mobility  Bed Mobility Overal bed mobility: Modified Independent             General bed mobility comments: pt gets to EOB on her own with increaed time and use of rails  Transfers Overall transfer level: Needs assistance Equipment used: Rolling walker (2 wheeled) Transfers: Sit to/from Stand Sit to Stand: Mod assist         General transfer comment: mod A for power up, mvmt slow, pt unsteady with inital standing  Ambulation/Gait Ambulation/Gait assistance: Min assist Ambulation Distance (Feet): 100 Feet Assistive device: Rolling walker (2 wheeled) Gait Pattern/deviations: Decreased step length -  right Gait velocity: decreased Gait velocity interpretation: <1.8 ft/sec, indicative of risk for recurrent falls General Gait Details: Gauze taped over left eye for session which corrected diplopia. pt drifts right, vc's needed to correct. Multiple posterior LOB with min A to correct, noted right knee weakness  Stairs            Wheelchair Mobility    Modified Rankin (Stroke Patients Only) Modified Rankin (Stroke Patients Only) Pre-Morbid Rankin Score: Moderate disability Modified Rankin: Moderately severe disability     Balance Overall balance assessment: Needs assistance Sitting-balance support: No upper extremity supported Sitting balance-Leahy Scale: Fair     Standing balance support: Bilateral upper extremity supported Standing balance-Leahy Scale: Poor Standing balance comment: unsafe to stand without UE support                             Pertinent Vitals/Pain Pain Assessment: No/denies pain    Home Living Family/patient expects to be discharged to:: Private residence Living Arrangements: Parent Available Help at Discharge: Family;Available 24 hours/day Type of Home: House Home Access: Ramped entrance     Home Layout: One level Home Equipment: Walker - 2 wheels;Cane - quad;Shower seat Additional Comments: pt's mother is 23 yo, is independent and does the driving, though she recently had a CABG.    Prior Function Level of Independence: Independent with assistive device(s)         Comments: ambukates with quad cane or RW at baseline, has R sided weakness     Hand Dominance  Extremity/Trunk Assessment   Upper Extremity Assessment: Defer to OT evaluation           Lower Extremity Assessment: Generalized weakness;RLE deficits/detail RLE Deficits / Details: knee ext 3+/5, hip flex 3-/5    Cervical / Trunk Assessment: Kyphotic  Communication   Communication: Expressive difficulties  Cognition Arousal/Alertness:  Awake/alert Behavior During Therapy: WFL for tasks assessed/performed Overall Cognitive Status: Impaired/Different from baseline Area of Impairment: Problem solving             Problem Solving: Slow processing;Requires verbal cues General Comments: slow processing noted, may be her baseline    General Comments General comments (skin integrity, edema, etc.): without gauze over left eye, pt squints left eye to self correct. Diplopia also corrected with right eye covered    Exercises        Assessment/Plan    PT Assessment Patient needs continued PT services  PT Diagnosis Difficulty walking;Abnormality of gait;Generalized weakness   PT Problem List Decreased strength;Decreased activity tolerance;Decreased balance;Decreased mobility;Decreased coordination;Decreased cognition;Decreased knowledge of precautions  PT Treatment Interventions DME instruction;Gait training;Functional mobility training;Therapeutic activities;Therapeutic exercise;Balance training;Neuromuscular re-education;Patient/family education;Cognitive remediation   PT Goals (Current goals can be found in the Care Plan section) Acute Rehab PT Goals Patient Stated Goal: stop seeing double PT Goal Formulation: With patient Time For Goal Achievement: 06/06/15 Potential to Achieve Goals: Good    Frequency Min 4X/week   Barriers to discharge Decreased caregiver support mother just had cardiac surgery    Co-evaluation               End of Session Equipment Utilized During Treatment: Gait belt Activity Tolerance: Patient tolerated treatment well Patient left: in chair;with call bell/phone within reach;with chair alarm set Nurse Communication: Mobility status    Functional Assessment Tool Used: clinical judgement' Functional Limitation: Mobility: Walking and moving around Mobility: Walking and Moving Around Current Status (Z6109): At least 20 percent but less than 40 percent impaired, limited or  restricted Mobility: Walking and Moving Around Goal Status [phone]): At least 1 percent but less than 20 percent impaired, limited or restricted    Time: 1253-1330 PT Time Calculation (min) (ACUTE ONLY): 37 min   Charges:   PT Evaluation $PT Eval Moderate Complexity: 1 Procedure PT Treatments $Gait Training: 8-22 mins   PT G Codes:   PT G-Codes **NOT FOR INPATIENT CLASS** Functional Assessment Tool Used: clinical judgement' Functional Limitation: Mobility: Walking and moving around Mobility: Walking and Moving Around Current Status (U9811): At least 20 percent but less than 40 percent impaired, limited or restricted Mobility: Walking and Moving Around Goal Status [phone]): At least 1 percent but less than 20 percent impaired, limited or restricted  Lyanne Co, PT  Acute Rehab Services  [phone]   Lyanne Co 05/23/2015, 2:01 PM

## 2015-05-23 NOTE — Progress Notes (Signed)
Inpatient Diabetes Program Recommendations  AACE/ADA: New Consensus Statement on Inpatient Glycemic Control (2015)  Target Ranges:  Prepandial:   less than 140 mg/dL      Peak postprandial:   less than 180 mg/dL (1-2 hours)      Critically ill patients:  140 - 180 mg/dL   Review of Glycemic Control  Diabetes history: DM 2 Outpatient Diabetes medications: Tresiba 20 units, Humalog 1-7 units TID Current orders for Inpatient glycemic control: Levemir 18 units, Novolog Moderate + HS scale  Inpatient Diabetes Program Recommendations:  Insulin - Basal: Noted patient received 18 units of Levemir last night. Scheduled to get 18 again this am. Patient had hypoglycemia 26 this am. Please reduce basal insulin dose to 10-12 units and adjust time to receive tonight.   Note patient is on tresiba at home. We have noticed that patients tend to hang on to tresiba because it lasts up to 36 hours at least.  Thanks,  Christena Deem RN, MSN, [facility] Inpatient Diabetes Coordinator Team Pager [phone] (8a-5p)

## 2015-05-23 NOTE — Progress Notes (Signed)
Inpatient Rehabilitation  Patient was screened by Maleek Craver for appropriateness for an Inpatient Acute Rehab consult.  At this time we are recommending an Inpatient Rehab consult.  Please order if you are agreeable.    Timoty Bourke, M.A., CCC/SLP Admission Coordinator  [facility]  Cell [phone]  

## 2015-05-23 NOTE — Evaluation (Signed)
Occupational Therapy Evaluation Patient Details Name: [name] MRN: [id number] DOB: [birth year] Today's Date: 05/23/2015    History of Present Illness [name] is a 55 y.o. female with history of chronic systolic heart failure, coronary artery disease being medically managed, CVA, type I diabetes mellitus on insulin and hyperlipidemia presenting with double vision and a fall. She did not receive IV t-PA due to delay in arrival. MRI: left paracentral midbrain and posterior L paracentral pontine infarct secondary to small vessel disease source    Clinical Impression   Patient presenting with decreased I in self care, balance, diplopia, decreased functional mobility/transfers, and safety.Patient reports being Mod I  PTA. Patient currently functioning min - mod overall. Patient will benefit from acute OT to increase overall independence in the areas of ADLs, functional mobility, and safety in order to safely discharge. Pt currently lives with mother who has had recent CABG and is unable to assist when she returns. Gauze over L eye this session which decreased double vision. Patch has been ordered for her and OT discussed when to wear.    Follow Up Recommendations  CIR    Equipment Recommendations  None recommended by OT    Recommendations for Other Services Rehab consult     Precautions / Restrictions Precautions Precautions: Fall Restrictions Weight Bearing Restrictions: No      Mobility Bed Mobility Overal bed mobility: Modified Independent             General bed mobility comments: seated in recliner chair upon entering the room  Transfers Overall transfer level: Needs assistance Equipment used: Rolling walker (2 wheeled) Transfers: Sit to/from Stand Sit to Stand: Min assist         General transfer comment: Min A to stand from recliner chair with min verbal cues for hand placement and technique    Balance Overall balance assessment: Needs  assistance Sitting-balance support: Single extremity supported Sitting balance-Leahy Scale: Fair   Postural control: Right lateral lean Standing balance support: Single extremity supported Standing balance-Leahy Scale: Poor Standing balance comment: use of RW            ADL Overall ADL's : Needs assistance/impaired Eating/Feeding: Set up Eating/Feeding Details (indicate cue type and reason): opening containers and cutting food Grooming: Oral care;Supervision/safety;Standing Grooming Details (indicate cue type and reason): Pt requiring min cues for initiation and sequencing of tasks. Pt needed min A for standing balance at sink side as she exhibits R lean in standing.              Lower Body Dressing: Min guard;Sitting/lateral leans;Sit to/from stand Lower Body Dressing Details (indicate cue type and reason): socks only this session. Pt seated and able to don/doff B socks with increased time and steady assist for balance.                General ADL Comments: Pt with decreased safety awareness this session. L eye occluded for double vision concerns with ambulation. No c/o nausea with movement. Pt standing at sink for grooming tasks with R lateral lean and min A needed for standing balance with functional task.     Vision Vision Assessment?: Yes Eye Alignment: Within Functional Limits Ocular Range of Motion: Within Functional Limits Alignment/Gaze Preference: Within Defined Limits Tracking/Visual Pursuits: Decreased smoothness of vertical tracking Diplopia Assessment: Disappears with one eye closed Depth Perception: Undershoots          Pertinent Vitals/Pain Pain Assessment: No/denies pain  Extremity/Trunk Assessment Upper Extremity Assessment Upper Extremity Assessment: RUE deficits/detail;LUE deficits/detail RUE Deficits / Details: shoulder flexion - 90 degrees. Pt reports , "frozen shoulder" LUE Deficits / Details: shoulder flexion - 90 degrees. Pt  reports , "frozen shoulder"   Lower Extremity Assessment Lower Extremity Assessment: Defer to PT evaluation RLE Deficits / Details: knee ext 3+/5, hip flex 3-/5 RLE Coordination: decreased fine motor;decreased gross motor   Cervical / Trunk Assessment Cervical / Trunk Assessment: Kyphotic   Communication Communication Communication: Expressive difficulties   Cognition Arousal/Alertness: Awake/alert Behavior During Therapy: WFL for tasks assessed/performed Overall Cognitive Status: Impaired/Different from baseline Area of Impairment: Problem solving;Safety/judgement         Safety/Judgement: Decreased awareness of safety   Problem Solving: Slow processing;Requires verbal cues;Difficulty sequencing;Decreased initiation General Comments: unsure if processing is baseline   General Comments               Home Living Family/patient expects to be discharged to:: Private residence Living Arrangements: Parent Available Help at Discharge: Family;Available 24 hours/day Type of Home: House Home Access: Ramped entrance     Home Layout: One level     Bathroom Shower/Tub: Tub/shower unit;Curtain Shower/tub characteristics: Engineer, building services: Standard Bathroom Accessibility: Yes How Accessible: Accessible via walker Home Equipment: Walker - 2 wheels;Cane - quad;Shower seat   Additional Comments: pt's mother is 27 yo, is independent and does the driving, though she recently had a CABG. Pt reports her mother drives and does all the cooking while she helps with cleaning of home.      Prior Functioning/Environment Level of Independence: Independent with assistive device(s)        Comments: Pt reports ambulation with quad cane or RW at baseline, shower seat for bathing, R sided weakness    OT Diagnosis: Generalized weakness;Disturbance of vision   OT Problem List: Impaired vision/perception;Decreased knowledge of use of DME or AE;Decreased activity tolerance;Decreased  safety awareness;Impaired balance (sitting and/or standing)   OT Treatment/Interventions: Self-care/ADL training;Therapeutic exercise;Neuromuscular education;Energy conservation;DME and/or AE instruction;Patient/family education;Visual/perceptual remediation/compensation;Therapeutic activities;Balance training    OT Goals(Current goals can be found in the care plan section) Acute Rehab OT Goals Patient Stated Goal: to see better OT Goal Formulation: With patient Time For Goal Achievement: 06/06/15 Potential to Achieve Goals: Fair ADL Goals Pt Will Perform Grooming: with supervision;sitting Pt Will Perform Upper Body Bathing: with supervision;sitting Pt Will Perform Lower Body Bathing: with supervision;sitting/lateral leans Pt Will Perform Upper Body Dressing: with supervision;sitting Pt Will Perform Lower Body Dressing: with supervision;sitting/lateral leans Pt Will Transfer to Toilet: with supervision;regular height toilet;ambulating Pt Will Perform Toileting - Clothing Manipulation and hygiene: with supervision;sit to/from stand Pt Will Perform Tub/Shower Transfer: with min guard assist;rolling walker;shower seat  OT Frequency: Min 2X/week   Barriers to D/C: Other (comment)  lives with mother who recently had CABG. No other family available to assist at home.           End of Session Equipment Utilized During Treatment: Rolling walker  Activity Tolerance: Patient tolerated treatment well Patient left: in chair;with call bell/phone within reach;with chair alarm set   Time: 4098-1191 OT Time Calculation (min): 25 min Charges:  OT General Charges $OT Visit: 1 Procedure OT Evaluation $OT Eval High Complexity: 1 Procedure OT Treatments $Self Care/Home Management : 8-22 mins G-Codes: OT G-codes **NOT FOR INPATIENT CLASS** Functional Assessment Tool Used: clinical judgment Functional Limitation: Self care Self Care Current Status (Y7829): At least 20 percent but less than 40  percent impaired, limited or restricted  Self Care Goal Status [phone]): At least 1 percent but less than 20 percent impaired, limited or restricted  Lowella Grip, MS, OTR/L 05/23/2015, 2:32 PM

## 2015-05-23 NOTE — Progress Notes (Signed)
Hypoglycemic Event  CBG: 26    Treatment: 25mL D50  Symptoms: Weakness, cold and clammy  Follow-up CBG: Time:0540 CBG Result:110  Possible Reasons for Event: Extra snack needed with bedtime insulin dosage   Comments/MD notified:MD notified    Gara Kroner

## 2015-05-23 NOTE — Care Management Note (Signed)
Case Management Note  Patient Details  Name: [name] MRN: [id number] Date of Birth: [birth year]  Subjective/Objective:  Patient admitted with CVA. She is from home with her parents.                   Action/Plan: Awaiting PT/OT recommendations. CM will continue to follow for discharge needs.   Expected Discharge Date:                  Expected Discharge Plan:     In-House Referral:     Discharge planning Services     Post Acute Care Choice:    Choice offered to:     DME Arranged:    DME Agency:     HH Arranged:    HH Agency:     Status of Service:  In process, will continue to follow  Medicare Important Message Given:    Date Medicare IM Given:    Medicare IM give by:    Date Additional Medicare IM Given:    Additional Medicare Important Message give by:     If discussed at Long Length of Stay Meetings, dates discussed:    Additional Comments:  Kermit Balo, RN 05/23/2015, 11:56 AM

## 2015-05-23 NOTE — Progress Notes (Signed)
PATIENT DETAILS Name: [name] Age: 55 y.o. Sex: female Date of Birth: 07-03-1960 Admit Date: 05/22/2015 Admitting Physician Calvert Cantor, MD ZOX:[id number] [name], MD  Subjective: Awake and alert today. Denies any double vision today.  Assessment/Plan: Principal Problem:  Acute CVA: Presented with diplopia, MRI brain confirms a infarct in the left paracentral midbrain/left paracentral pons. CTA head negative for major stenosis. Await echo/carotid Doppler. A1c pending, LDL 58. Continue both aspirin and Plavix for now, await input from stroke team.   Active Problems: Dyslipidemia: LDL 58, continue Lipitor.  Insulin-dependent diabetes: Significant hypoglycemic event-this morning-decrease Lantus to 8 units, continue SSI for now. Follow A1c and CBGs  Chronic combined systolic and diastolic heart failure: Last EF on 03/15 EF around 40%-45% with a grade 2 diastolic dysfunction. Clinically compensated. Reviewed last outpatient cardiology note-unfortunately has had hyperkalemia with low doses of Ace and spironolactone. Has had issues with orthostatic hypotension. Continue Coreg, Lasix and digoxin. Not on Ace/ARB due to hyperkalemia and Hypotension  History of CAD: Currently without chest pain or shortness of breath. Continue antiplatelets, beta blocker and statin. Follow.  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 Watch closely.  Depression: Appears stable, continue Cymbalta and Celexa  Disposition: Remain inpatient  Antimicrobial agents  See below  Anti-infectives    None      DVT Prophylaxis: Prophylactic Lovenox   Code Status: DNR-reconfirmed with patient and her mother at bedside  Family Communication None at bedside  Procedures:   CONSULTS:  neurology  Time spent 30 minutes-Greater than 50% of this time was spent in counseling, explanation of  diagnosis, planning of further management, and coordination of care.  MEDICATIONS: Scheduled Meds: .  stroke: mapping our early stages of recovery book   Does not apply Once  . aspirin EC  81 mg Oral QHS  . carvedilol  6.25 mg Oral BID  . citalopram  20 mg Oral Daily  . clopidogrel  75 mg Oral Daily  . digoxin  0.0625 mg Oral QODAY  . DULoxetine  30 mg Oral Daily  . enoxaparin (LOVENOX) injection  40 mg Subcutaneous Q24H  . ferrous sulfate  650 mg Oral Q supper  . furosemide  20 mg Oral Daily  . insulin aspart  0-15 Units Subcutaneous TID WC  . insulin aspart  0-5 Units Subcutaneous QHS  . insulin detemir  18 Units Subcutaneous Daily  . rosuvastatin  40 mg Oral Q supper   Continuous Infusions:  PRN Meds:.    PHYSICAL EXAM: Vital signs in last 24 hours: Filed Vitals:   05/23/15 0211 05/23/15 0415 05/23/15 0523 05/23/15 0657  BP: 120/54 152/63 98/49 124/70  Pulse: 71 60 115 70  Temp: 98.1 F (36.7 C) 97.9 F (36.6 C) 100.6 F (38.1 C) 97.3 F (36.3 C)  TempSrc: Oral Oral Oral Oral  Resp: Height:      Weight:      SpO2: 100% 98% 99% 98%    Weight change:  Filed Weights   05/22/15 1401 05/22/15 1950  Weight: 70.308 kg (155 lb) 70.5 kg (155 lb 6.8 oz)   Body mass index is 26.67 kg/(m^2).   Gen Exam: Awake and alert with clear speech.  Neck: Supple, No JVD.   Chest: B/L Clear.   CVS: S1 S2 Regular, no murmurs.  Abdomen: soft, BS +,  non tender, non distended.  Extremities: no edema, lower extremities warm to touch. Neurologic: Non Focal.  Skin: No Rash.   Wounds: N/A.    Intake/Output from previous day: No intake or output data in the 24 hours ending 05/23/15 1130   LAB RESULTS: CBC  Recent Labs Lab 05/22/15 1414 05/23/15 0815  WBC 7.4 7.7  HGB 14.3 15.4*  HCT 43.3 45.2  PLT 220 236  MCV 91.5 91.1  MCH 30.2 31.0  MCHC 33.0 34.1  RDW 12.9 13.1  LYMPHSABS 1.8  --   MONOABS 0.4  --   EOSABS 0.6  --   BASOSABS 0.0  --      Chemistries   Recent Labs Lab 05/22/15 1414 05/23/15 0815  NA 145 143  K 4.0 4.2  CL 108 107  CO2 29 24  GLUCOSE 121* 204*  BUN 19 18  CREATININE 1.13* 1.12*  CALCIUM 9.3 9.7    CBG:  Recent Labs Lab 05/22/15 1625 05/22/15 1742 05/22/15 2003 05/23/15 0522 05/23/15 0545  GLUCAP 66 130* 129* 26* 110*    GFR Estimated Creatinine Clearance: 54 mL/min (by C-G formula based on Cr of 1.12).  Coagulation profile No results for input(s): INR, PROTIME in the last 168 hours.  Cardiac Enzymes No results for input(s): CKMB, TROPONINI, MYOGLOBIN in the last 168 hours.  Invalid input(s): CK  Invalid input(s): POCBNP No results for input(s): DDIMER in the last 72 hours. No results for input(s): HGBA1C in the last 72 hours.  Recent Labs  05/23/15 0500  CHOL 126  HDL 45  LDLCALC 58  TRIG 115  CHOLHDL 2.8   No results for input(s): TSH, T4TOTAL, T3FREE, THYROIDAB in the last 72 hours.  Invalid input(s): FREET3 No results for input(s): VITAMINB12, FOLATE, FERRITIN, TIBC, IRON, RETICCTPCT in the last 72 hours. No results for input(s): LIPASE, AMYLASE in the last 72 hours.  Urine Studies No results for input(s): UHGB, CRYS in the last 72 hours.  Invalid input(s): UACOL, UAPR, USPG, UPH, UTP, UGL, UKET, UBIL, UNIT, UROB, ULEU, UEPI, UWBC, URBC, UBAC, CAST, UCOM, BILUA  MICROBIOLOGY: No results found for this or any previous visit (from the past 240 hour(s)).  RADIOLOGY STUDIES/RESULTS: Ct Angio Head W/cm &/or Wo Cm  05/23/2015  CLINICAL DATA:  Diplopia. Generalized weakness and blurred vision. Small acute infarcts in the midbrain and pons on brain MRI. EXAM: CT ANGIOGRAPHY HEAD TECHNIQUE: Multidetector CT imaging of the head was performed using the standard protocol during bolus administration of intravenous contrast. Multiplanar CT image reconstructions and MIPs were obtained to evaluate the vascular anatomy. CONTRAST:  50mL OMNIPAQUE IOHEXOL 350 MG/ML SOLN  COMPARISON:  Brain MRI earlier today FINDINGS: CTA HEAD Anterior circulation: Internal carotid arteries are patent from skullbase to carotid termini with mild atherosclerotic plaque bilaterally. There is mild right proximal supraclinoid ICA stenosis. ACAs and MCAs are patent without evidence of major branch occlusion or significant stenosis. No intracranial aneurysm is identified. Posterior circulation: The visualized distal vertebral arteries are patent with the left being dominant. There is mild atherosclerotic irregularity of the left V4 segment without stenosis. AICA and SCA origins are patent. Basilar artery is patent without stenosis. There is a medium-sized left posterior communicating artery with slight hypoplasia of the left P1 segment. There is also a tiny right posterior communicating artery. PCAs are patent with mild branch vessel irregularity but no significant proximal stenosis. Venous sinuses: Patent. Anatomic variants: None. Delayed phase: No abnormal enhancement. Small acute brainstem infarcts on MRI are not  well seen by CT. Chronic small vessel ischemic changes are noted in the cerebral white matter bilaterally. Mildly advanced cerebral atrophy for age. IMPRESSION: 1. No major intracranial arterial occlusion. 2. Mild intracranial atherosclerosis with mild right ICA stenosis. Electronically Signed   By: Sebastian Ache M.D.   On: 05/23/2015 10:23   Dg Chest 2 View  05/23/2015  CLINICAL DATA:  Weakness .  Fever. EXAM: CHEST  2 VIEW COMPARISON:  09/15/2014 . FINDINGS: Mediastinum and hilar structures normal. Lungs are clear. Heart size normal. No pleural effusion or pneumothorax. Old right seventh rib fracture. Chest is stable from prior exam. IMPRESSION: No acute cardiopulmonary disease. Electronically Signed   By: Maisie Fus  Register   On: 05/23/2015 07:39   Ct Head Wo Contrast  05/22/2015  CLINICAL DATA:  Double vision at of left eye since yesterday. EXAM: CT HEAD WITHOUT CONTRAST TECHNIQUE:  Contiguous axial images were obtained from the base of the skull through the vertex without intravenous contrast. COMPARISON:  05/26/2013. FINDINGS: There is no evidence for acute hemorrhage, hydrocephalus, mass lesion, or abnormal extra-axial fluid collection. No definite CT evidence for acute infarction. Patchy low attenuation in the deep hemispheric and periventricular white matter is nonspecific, but likely reflects chronic microvascular ischemic demyelination. Diffuse loss of parenchymal volume is consistent with atrophy. The visualized paranasal sinuses and mastoid air cells are clear. IMPRESSION: Stable exam. Mild atrophy for age with chronic small vessel white matter ischemic demyelination. No acute intracranial abnormality. Electronically Signed   By: Kennith Center M.D.   On: 05/22/2015 15:25   Mr Laqueta Jean ZO Contrast  05/23/2015  CLINICAL DATA:  55 year old hypertensive diabetic female with anemia and hyperlipidemia presenting with double vision. Recent fall. Subsequent encounter. EXAM: MRI HEAD WITHOUT AND WITH CONTRAST TECHNIQUE: Multiplanar, multiecho pulse sequences of the brain and surrounding structures were obtained without and with intravenous contrast. CONTRAST:  16mL MULTIHANCE GADOBENATE DIMEGLUMINE 529 MG/ML IV SOLN COMPARISON:  05/22/2015 CT.  08/28/2010 MR. FINDINGS: Sequences are motion degraded. Patient was not able to complete post-contrast coronal imaging. Acute small nonhemorrhagic infarcts left paracentral midbrain (extending to the inferior medial aspect left peri third ventricular region) and posterior left paracentral pons. Remote small infarcts scattered throughout the centrum semi ovale/coronal radiata bilaterally. Remote thalamic infarcts. Chronic small vessel disease changes. Global atrophy without hydrocephalus. Mega cisterna magna incidentally noted. No intracranial mass or abnormal enhancement noted on motion degraded exam. Major intracranial vascular structures are patent.  Partial opacification right mastoid air cells without obstructing lesion of eustachian tube noted. Minimal mucosal thickening ethmoid sinus air cells and inferior maxillary sinuses. Minimal exophthalmos.  Appearance of prior lens replacement. Slightly heterogeneous bone marrow may be related to patient's habitus or underlying anemia IMPRESSION: Sequences are motion degraded. Patient was not able to complete post-contrast coronal imaging. Acute small nonhemorrhagic infarcts left paracentral midbrain and posterior left paracentral pons. Remote small infarcts scattered throughout the centrum semiovale/coronal radiata bilaterally. Remote thalamic infarcts. Chronic small vessel disease changes. Global atrophy without hydrocephalus. No intracranial mass or abnormal enhancement noted on motion degraded exam. Partial opacification right mastoid air cells without obstructing lesion of eustachian tube noted. Minimal mucosal thickening ethmoid sinus air cells and inferior maxillary sinuses. Electronically Signed   By: Lacy Duverney M.D.   On: 05/23/2015 10:02    Jeoffrey Massed, MD  [facility] Pager:[phone]  If 7PM-7AM, please contact night-coverage [email or web address] Password [email or web address] 05/23/2015, 11:30 AM   LOS: 1 day

## 2015-05-24 ENCOUNTER — Ambulatory Visit (HOSPITAL_COMMUNITY): Payer: Medicare Other

## 2015-05-24 DIAGNOSIS — I639 Cerebral infarction, unspecified: Secondary | ICD-10-CM

## 2015-05-24 DIAGNOSIS — I6789 Other cerebrovascular disease: Secondary | ICD-10-CM

## 2015-05-24 DIAGNOSIS — Z8673 Personal history of transient ischemic attack (TIA), and cerebral infarction without residual deficits: Secondary | ICD-10-CM | POA: Insufficient documentation

## 2015-05-24 DIAGNOSIS — I69359 Hemiplegia and hemiparesis following cerebral infarction affecting unspecified side: Secondary | ICD-10-CM

## 2015-05-24 DIAGNOSIS — I10 Essential (primary) hypertension: Secondary | ICD-10-CM

## 2015-05-24 DIAGNOSIS — I69959 Hemiplegia and hemiparesis following unspecified cerebrovascular disease affecting unspecified side: Secondary | ICD-10-CM

## 2015-05-24 DIAGNOSIS — I6330 Cerebral infarction due to thrombosis of unspecified cerebral artery: Secondary | ICD-10-CM

## 2015-05-24 DIAGNOSIS — I6302 Cerebral infarction due to thrombosis of basilar artery: Principal | ICD-10-CM

## 2015-05-24 LAB — GLUCOSE, CAPILLARY
GLUCOSE-CAPILLARY: 154 mg/dL — AB (ref 65–99)
GLUCOSE-CAPILLARY: 177 mg/dL — AB (ref 65–99)
Glucose-Capillary: 84 mg/dL (ref 65–99)
Glucose-Capillary: 206 mg/dL — ABNORMAL HIGH (ref 65–99)

## 2015-05-24 LAB — HEMOGLOBIN A1C
Hgb A1c MFr Bld: 11 % — ABNORMAL HIGH (ref 4.8–5.6)
Mean Plasma Glucose: 269 mg/dL

## 2015-05-24 MED ORDER — INSULIN ASPART 100 UNIT/ML ~~LOC~~ SOLN
4.0000 [IU] | Freq: Three times a day (TID) | SUBCUTANEOUS | Status: DC
  Administered 2015-05-24 – 2015-05-25 (×4): 4 [IU] via SUBCUTANEOUS

## 2015-05-24 MED ORDER — INSULIN DETEMIR 100 UNIT/ML ~~LOC~~ SOLN
12.0000 [IU] | Freq: Every day | SUBCUTANEOUS | Status: DC
  Administered 2015-05-25: 12 [IU] via SUBCUTANEOUS
  Filled 2015-05-24: qty 0.12

## 2015-05-24 NOTE — Progress Notes (Signed)
PATIENT DETAILS Name: [name] Age: 55 y.o. Sex: female Date of Birth: Sep 09, 1960 Admit Date: 05/22/2015 Admitting Physician Calvert Cantor, MD [id number] [name], MD  Subjective: Awake and alert today. Continues to have intermittent double vision.  Assessment/Plan: Principal Problem:  Acute CVA: Presented with diplopia, MRI brain confirms a infarct in the left paracentral midbrain/left paracentral pons. CTA head negative for major stenosis. Await echo/carotid Doppler. A1c 11.0, LDL 58. Continue both aspirin and Plavix for now, await further input from stroke team.   Active Problems: Dyslipidemia: LDL 58, continue Lipitor.  Insulin-dependent diabetes: Had hypoglycemic event on admission, hence Lantus was decreased to 8 units-we will increase Lantus to 12 units today, we will add NovoLog 4 units 3 times a day with meals. A1c 11.0  Chronic combined systolic and diastolic heart failure: Last EF on 03/15 EF around 40%-45% with a grade 2 diastolic dysfunction. Clinically compensated. Reviewed last outpatient cardiology note-unfortunately has had hyperkalemia with low doses of Ace and spironolactone. Has had issues with orthostatic hypotension. Continue Coreg, Lasix and digoxin. Not on Ace/ARB due to hyperkalemia and Hypotension  History of CAD: Currently without chest pain or shortness of breath. Continue antiplatelets, beta blocker and statin. Follow.  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 Watch closely.  Depression: Appears stable, continue Cymbalta and Celexa  Disposition: Remain inpatient-CIR once workup is complete  Antimicrobial agents  See below  Anti-infectives    None      DVT Prophylaxis: Prophylactic Lovenox   Code Status: DNR-reconfirmed with patient and her mother at bedside on 1/30  Family Communication None at  bedside  Procedures: None  CONSULTS:  neurology  Time spent 25 minutes-Greater than 50% of this time was spent in counseling, explanation of diagnosis, planning of further management, and coordination of care.  MEDICATIONS: Scheduled Meds: .  stroke: mapping our early stages of recovery book   Does not apply Once  . aspirin EC  81 mg Oral QHS  . carvedilol  6.25 mg Oral BID  . citalopram  20 mg Oral Daily  . clopidogrel  75 mg Oral Daily  . digoxin  0.0625 mg Oral QODAY  . DULoxetine  30 mg Oral Daily  . enoxaparin (LOVENOX) injection  40 mg Subcutaneous Q24H  . ferrous sulfate  650 mg Oral Q supper  . furosemide  20 mg Oral Daily  . insulin aspart  0-15 Units Subcutaneous TID WC  . insulin detemir  8 Units Subcutaneous Daily  . rosuvastatin  40 mg Oral Q supper   Continuous Infusions:  PRN Meds:.    PHYSICAL EXAM: Vital signs in last 24 hours: Filed Vitals:   05/23/15 2051 05/24/15 0145 05/24/15 0656 05/24/15 1013  BP: 125/59 129/63 124/66 106/63  Pulse: 74 79 77 72  Temp: 98.6 F (37 C) 98.2 F (36.8 C) 98.2 F (36.8 C) 98.1 F (36.7 C)  TempSrc: Oral Oral Oral Oral  Resp: Height:      Weight:      SpO2: 97% 98% 99% 100%    Weight change:  Filed Weights   05/22/15 1401 05/22/15 1950  Weight: 70.308 kg (155 lb) 70.5 kg (155 lb 6.8 oz)   Body mass index is 26.67 kg/(m^2).   Gen Exam: Awake and alert with clear speech.  Neck: Supple, No JVD.  Chest: B/L Clear.   CVS: S1 S2 Regular, no murmurs.  Abdomen: soft, BS +, non tender, non distended.  Extremities: no edema, lower extremities warm to touch. Neurologic: Non Focal.  Skin: No Rash.   Wounds: N/A.    Intake/Output from previous day: No intake or output data in the 24 hours ending 05/24/15 1146   LAB RESULTS: CBC  Recent Labs Lab 05/22/15 1414 05/23/15 0815  WBC 7.4 7.7  HGB 14.3 15.4*  HCT 43.3 45.2  PLT 220 236  MCV 91.5 91.1  MCH 30.2 31.0  MCHC 33.0 34.1  RDW 12.9  13.1  LYMPHSABS 1.8  --   MONOABS 0.4  --   EOSABS 0.6  --   BASOSABS 0.0  --     Chemistries   Recent Labs Lab 05/22/15 1414 05/23/15 0815  NA 145 143  K 4.0 4.2  CL 108 107  CO2 29 24  GLUCOSE 121* 204*  BUN 19 18  CREATININE 1.13* 1.12*  CALCIUM 9.3 9.7    CBG:  Recent Labs Lab 05/23/15 0545 05/23/15 1221 05/23/15 1635 05/23/15 2210 05/24/15 0655  GLUCAP 110* 192* 279* 268* 154*    GFR Estimated Creatinine Clearance: 54 mL/min (by C-G formula based on Cr of 1.12).  Coagulation profile No results for input(s): INR, PROTIME in the last 168 hours.  Cardiac Enzymes No results for input(s): CKMB, TROPONINI, MYOGLOBIN in the last 168 hours.  Invalid input(s): CK  Invalid input(s): POCBNP No results for input(s): DDIMER in the last 72 hours.  Recent Labs  05/23/15 0510  HGBA1C 11.0*    Recent Labs  05/23/15 0500  CHOL 126  HDL 45  LDLCALC 58  TRIG 115  CHOLHDL 2.8   No results for input(s): TSH, T4TOTAL, T3FREE, THYROIDAB in the last 72 hours.  Invalid input(s): FREET3 No results for input(s): VITAMINB12, FOLATE, FERRITIN, TIBC, IRON, RETICCTPCT in the last 72 hours. No results for input(s): LIPASE, AMYLASE in the last 72 hours.  Urine Studies No results for input(s): UHGB, CRYS in the last 72 hours.  Invalid input(s): UACOL, UAPR, USPG, UPH, UTP, UGL, UKET, UBIL, UNIT, UROB, ULEU, UEPI, UWBC, URBC, UBAC, CAST, UCOM, BILUA  MICROBIOLOGY: Recent Results (from the past 240 hour(s))  Culture, blood (routine x 2)     Status: None (Preliminary result)   Collection Time: 05/23/15  8:15 AM  Result Value Ref Range Status   Specimen Description BLOOD LEFT HAND  Final   Special Requests BOTTLES DRAWN AEROBIC ONLY 2CC  Final   Culture NO GROWTH 1 DAY  Final   Report Status PENDING  Incomplete  Culture, blood (routine x 2)     Status: None (Preliminary result)   Collection Time: 05/23/15  8:23 AM  Result Value Ref Range Status   Specimen  Description BLOOD RIGHT HAND  Final   Special Requests BOTTLES DRAWN AEROBIC ONLY 2CC  Final   Culture NO GROWTH 1 DAY  Final   Report Status PENDING  Incomplete    RADIOLOGY STUDIES/RESULTS: Ct Angio Head W/cm &/or Wo Cm  05/23/2015  CLINICAL DATA:  Diplopia. Generalized weakness and blurred vision. Small acute infarcts in the midbrain and pons on brain MRI. EXAM: CT ANGIOGRAPHY HEAD TECHNIQUE: Multidetector CT imaging of the head was performed using the standard protocol during bolus administration of intravenous contrast. Multiplanar CT image reconstructions and MIPs were obtained to evaluate the vascular anatomy. CONTRAST:  50mL OMNIPAQUE IOHEXOL 350 MG/ML SOLN COMPARISON:  Brain MRI earlier today FINDINGS: CTA  HEAD Anterior circulation: Internal carotid arteries are patent from skullbase to carotid termini with mild atherosclerotic plaque bilaterally. There is mild right proximal supraclinoid ICA stenosis. ACAs and MCAs are patent without evidence of major branch occlusion or significant stenosis. No intracranial aneurysm is identified. Posterior circulation: The visualized distal vertebral arteries are patent with the left being dominant. There is mild atherosclerotic irregularity of the left V4 segment without stenosis. AICA and SCA origins are patent. Basilar artery is patent without stenosis. There is a medium-sized left posterior communicating artery with slight hypoplasia of the left P1 segment. There is also a tiny right posterior communicating artery. PCAs are patent with mild branch vessel irregularity but no significant proximal stenosis. Venous sinuses: Patent. Anatomic variants: None. Delayed phase: No abnormal enhancement. Small acute brainstem infarcts on MRI are not well seen by CT. Chronic small vessel ischemic changes are noted in the cerebral white matter bilaterally. Mildly advanced cerebral atrophy for age. IMPRESSION: 1. No major intracranial arterial occlusion. 2. Mild intracranial  atherosclerosis with mild right ICA stenosis. Electronically Signed   By: Sebastian Ache M.D.   On: 05/23/2015 10:23   Dg Chest 2 View  05/23/2015  CLINICAL DATA:  Weakness .  Fever. EXAM: CHEST  2 VIEW COMPARISON:  09/15/2014 . FINDINGS: Mediastinum and hilar structures normal. Lungs are clear. Heart size normal. No pleural effusion or pneumothorax. Old right seventh rib fracture. Chest is stable from prior exam. IMPRESSION: No acute cardiopulmonary disease. Electronically Signed   By: Maisie Fus  Register   On: 05/23/2015 07:39   Ct Head Wo Contrast  05/22/2015  CLINICAL DATA:  Double vision at of left eye since yesterday. EXAM: CT HEAD WITHOUT CONTRAST TECHNIQUE: Contiguous axial images were obtained from the base of the skull through the vertex without intravenous contrast. COMPARISON:  05/26/2013. FINDINGS: There is no evidence for acute hemorrhage, hydrocephalus, mass lesion, or abnormal extra-axial fluid collection. No definite CT evidence for acute infarction. Patchy low attenuation in the deep hemispheric and periventricular white matter is nonspecific, but likely reflects chronic microvascular ischemic demyelination. Diffuse loss of parenchymal volume is consistent with atrophy. The visualized paranasal sinuses and mastoid air cells are clear. IMPRESSION: Stable exam. Mild atrophy for age with chronic small vessel white matter ischemic demyelination. No acute intracranial abnormality. Electronically Signed   By: Kennith Center M.D.   On: 05/22/2015 15:25   Mr Laqueta Jean WU Contrast  05/23/2015  CLINICAL DATA:  55 year old hypertensive diabetic female with anemia and hyperlipidemia presenting with double vision. Recent fall. Subsequent encounter. EXAM: MRI HEAD WITHOUT AND WITH CONTRAST TECHNIQUE: Multiplanar, multiecho pulse sequences of the brain and surrounding structures were obtained without and with intravenous contrast. CONTRAST:  16mL MULTIHANCE GADOBENATE DIMEGLUMINE 529 MG/ML IV SOLN COMPARISON:   05/22/2015 CT.  08/28/2010 MR. FINDINGS: Sequences are motion degraded. Patient was not able to complete post-contrast coronal imaging. Acute small nonhemorrhagic infarcts left paracentral midbrain (extending to the inferior medial aspect left peri third ventricular region) and posterior left paracentral pons. Remote small infarcts scattered throughout the centrum semi ovale/coronal radiata bilaterally. Remote thalamic infarcts. Chronic small vessel disease changes. Global atrophy without hydrocephalus. Mega cisterna magna incidentally noted. No intracranial mass or abnormal enhancement noted on motion degraded exam. Major intracranial vascular structures are patent. Partial opacification right mastoid air cells without obstructing lesion of eustachian tube noted. Minimal mucosal thickening ethmoid sinus air cells and inferior maxillary sinuses. Minimal exophthalmos.  Appearance of prior lens replacement. Slightly heterogeneous bone marrow may be related to  patient's habitus or underlying anemia IMPRESSION: Sequences are motion degraded. Patient was not able to complete post-contrast coronal imaging. Acute small nonhemorrhagic infarcts left paracentral midbrain and posterior left paracentral pons. Remote small infarcts scattered throughout the centrum semiovale/coronal radiata bilaterally. Remote thalamic infarcts. Chronic small vessel disease changes. Global atrophy without hydrocephalus. No intracranial mass or abnormal enhancement noted on motion degraded exam. Partial opacification right mastoid air cells without obstructing lesion of eustachian tube noted. Minimal mucosal thickening ethmoid sinus air cells and inferior maxillary sinuses. Electronically Signed   By: Lacy Duverney M.D.   On: 05/23/2015 10:02    Jeoffrey Massed, MD  [facility] Pager:[phone]  If 7PM-7AM, please contact night-coverage [email or web address] Password [email or web address] 05/24/2015, 11:46 AM   LOS: 2 days

## 2015-05-24 NOTE — Evaluation (Signed)
Speech Language Pathology Evaluation Patient Details Name: [name] MRN: [id number] DOB: 03-19-1961 Today's Date: 05/24/2015 Time:  -     Problem List:  Patient Active Problem List   Diagnosis Date Noted  . Diplopia   . 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 DM type 2 with diabetic peripheral neuropathy (HCC)   . History of CVA with residual deficit   . Benign essential HTN   . Hemiparesis affecting dominant side as late effect of stroke (HCC)   . DM type 2, goal A1c below 7 05/22/2015  . Heme + stool 05/04/2014  . DKA (diabetic ketoacidoses) (HCC) 03/23/2013  . Elevated troponin 03/23/2013  . Type I (juvenile type) diabetes mellitus with ophthalmic manifestations, uncontrolled 02/17/2013  . Depression 02/17/2013  . Altered mental status 02/14/2013    Class: Acute  . Diabetic ketoacidosis (HCC) 02/14/2013    Class: Acute  . Hypercalcemia 02/14/2013    Class: Acute  . Dehydration 02/14/2013    Class: Acute  . Acute renal insufficiency 02/14/2013    Class: Acute  . Fatigue 05/04/2012  . OSA (obstructive sleep apnea) 07/03/2011  . UTI (lower urinary tract infection) 03/21/2011  . Preseptal cellulitis 11/21/2010  . CVA (cerebral infarction) 10/17/2010  . FATIGUE 07/10/2010  . PERIMENOPAUSAL STATUS 07/10/2010  . DIABETES MELLITUS, TYPE I 04/25/2010  . DIABETIC  RETINOPATHY 04/25/2010  . Coronary atherosclerosis of native coronary artery 04/25/2010  . ISCHEMIC CARDIOMYOPATHY 04/25/2010  . Chronic systolic heart failure (HCC) 04/25/2010  . ORTHOSTATIC HYPOTENSION 04/25/2010  . Hyperlipidemia 08/30/2008  . ANEMIA-IRON DEFICIENCY 08/30/2008  . HYPERTENSION 08/30/2008   Past Medical History:  Past Medical History  Diagnosis Date  . Hypertension   . Vitamin D deficiency   . Coronary artery disease     a. (12/11) NSTEMI, in the setting of DKA, s/p Cath--LAD totalled in the midportion, diag 70% sten (small 2-mm ), LCx nonobst, OM1 40%, OM2 30%, RCA-  no dz. chronic calcified LAD --> no intervention.  b. (06/2010) NSTEMI s/p cath showing same findings   . Ischemic cardiomyopathy   . Orthostatic hypotension     a. sycopal episode in 12/11   . Diabetes mellitus     a. Insulin dependant Type I   . Hyperlipidemia   . Diabetic nephropathy (HCC)   . Diabetic retinopathy   . Gastroparesis   . Depression   . Systolic heart failure     a. (08/2012) EF: 40% Mod hypokin in mid-dist-ant-sept myocard--> psuedonml L vent fill pattern, Grd 2 diast dyscfxn  . Iron deficiency anemia   . Hx MRSA infection   . Osteopenia   . Esophagitis   . NSTEMI (non-ST elevated myocardial infarction) (HCC)     a. (03/2010) NSTEMI during admission for DKA  b.  . Stroke [facility])     a. (2012) residual R hand/arm weakness  b. while on ASA and plavix  . Diabetic neuropathy [facility])    Past Surgical History:  Past Surgical History  Procedure Laterality Date  . Appendectomy  1980  . Tubal ligation  1987  . Cesarean section  1984  . Refractive surgery      x3   HPI:  55 y.o. female with history of systolic CHF, CAD being medically managed, CVA 2012 with right UE residual deficit, type I DM on insulin and HLD presenting with double vision and a fall. She did not receive IV t-PA due to delay in arrival. MRI: left paracentral midbrain and posterior  L paracentral pontine infarct    Assessment / Plan / Recommendation Clinical Impression  Pt reports baseline deficits in memory s/p prior CVA; per assessment today, she performed well in areas of mental calculations, verbal problem solving, and divergent word generation.  She had mild deficits in working memory.  Language is intact; speech is clear.  No acute SLP needs identified- will sign off; pt may benefit from further evaluation of memory at CIR level.      SLP Assessment   (Pt declines acute SLP f/u.)                     SLP Evaluation Prior Functioning  Cognitive/Linguistic Baseline: Information not  available Type of Home: House Available Help at Discharge: Family;Available 24 hours/day   Cognition  Overall Cognitive Status: No family/caregiver present to determine baseline cognitive functioning Arousal/Alertness: Awake/alert Orientation Level: Oriented X4 Attention: Selective Selective Attention: Appears intact Memory: Impaired Memory Impairment: Retrieval deficit (mild deficits that pt describes as baseline) Awareness: Appears intact Problem Solving: Appears intact    Comprehension  Auditory Comprehension Overall Auditory Comprehension: Appears within functional limits for tasks assessed Visual Recognition/Discrimination Discrimination: Within Function Limits Reading Comprehension Reading Status: Not tested    Expression Expression Primary Mode of Expression: Verbal Verbal Expression Overall Verbal Expression: Appears within functional limits for tasks assessed Written Expression Dominant Hand: Right Written Expression: Not tested   Oral / Motor  Oral Motor/Sensory Function Overall Oral Motor/Sensory Function: Mild impairment Facial ROM: Reduced right;Suspected CN VII (facial) dysfunction Facial Symmetry: Abnormal symmetry right;Suspected CN VII (facial) dysfunction Motor Speech Overall Motor Speech: Appears within functional limits for tasks assessed   GO                   Ericberto Padget L. Samson [place], [place] CCC/SLP Pager [phone]  Blenda Mounts Laurice 05/24/2015, 11:22 AM

## 2015-05-24 NOTE — Progress Notes (Signed)
*  PRELIMINARY RESULTS* Vascular Ultrasound Carotid Duplex (Doppler) has been completed.   There is no obvious evidence of hemodynamically significant internal carotid artery stenosis bilaterally. Vertebral arteries are patent with antegrade flow.  05/24/2015 1:35 PM Gertie Fey, RVT, RDCS, RDMS

## 2015-05-24 NOTE — Progress Notes (Signed)
Inpatient Rehabilitation  I met with the patient at the bedside to discuss the recommendation of CIR for her post acute rehab. I provided pt. With informational booklets and answered her questions.  She stated that her mom can provide 24 hour supervision once Dc'd from CIR.  I have initiated insurance authorization for possible admission tomorrow pending approval, bed availability and medical readiness.  Please call if questions.  [place] Admissions Coordinator Cell [phone] Office [phone]

## 2015-05-24 NOTE — H&P (Signed)
Physical Medicine and Rehabilitation Admission H&P    Chief Complaint  Patient presents with  . Weakness  : HPI: [name] is a 55 y.o. 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 Patient lives with parent. One level home with ramped entrance. Mother is 48 years old and limited assistance. Patient uses a rolling walker prior to admission. Presented 05/22/2015 with double vision decrease in balance and recent fall while using a walker. MRI of the brain showed acute small nonhemorrhagic infarct left paracentral midbrain and posterior left paracentral pons. Remote small infarcts scattered throughout the centrum semiovale ovale, remote thalamic infarct. Chronic small vessel disease changes. CT angiogram head showed no major intracranial arterial occlusion. Echocardiogram with ejection fraction of 40%. Systolic function moderately reduced. No cardiac source of emboli identified.. Patient did not receive TPA. Neurology services consulted maintain on aspirin and Plavix for CVA prophylaxis. Subcutaneous Lovenox for DVT prophylaxis. Maintain on regular diet consistency. Physical and occupational therapy evaluations completed with recommendations of physical medicine rehabilitation consult.  ROS Constitutional: Negative for fever and chills.  HENT: Negative for hearing loss.  Eyes: Positive for double vision. Negative for blurred vision.  Respiratory: Negative for cough and shortness of breath.  Cardiovascular: Negative for chest pain and palpitations.  Gastrointestinal:   GERD  Genitourinary: Negative for dysuria and hematuria.  Musculoskeletal: Positive for myalgias, joint pain and falls.  Skin: Negative for rash.  Neurological: Positive for dizziness. Negative for seizures, loss of consciousness, weakness  and headaches.  Psychiatric/Behavioral: Positive for depression.   Anxiety  All other systems reviewed and are negative   Past Medical History  Diagnosis Date  . Hypertension   . Vitamin D deficiency   . Coronary artery disease     a. (12/11) NSTEMI, in the setting of DKA, s/p Cath--LAD totalled in the midportion, diag 70% sten (small 2-mm ), LCx nonobst, OM1 40%, OM2 30%, RCA- no dz. chronic calcified LAD --> no intervention.  b. (06/2010) NSTEMI s/p cath showing same findings   . Ischemic cardiomyopathy   . Orthostatic hypotension     a. sycopal episode in 12/11   . Diabetes mellitus     a. Insulin dependant Type I   . Hyperlipidemia   . Diabetic nephropathy ([place])   . Diabetic retinopathy   . Gastroparesis   . Depression   . Systolic heart failure     a. (08/2012) EF: 40% Mod hypokin in mid-dist-ant-sept myocard--> psuedonml L vent fill pattern, Grd 2 diast dyscfxn  . Iron deficiency anemia   . Hx MRSA infection   . Osteopenia   . Esophagitis   . NSTEMI (non-ST elevated myocardial infarction) ([place])     a. (03/2010) NSTEMI during admission for DKA  b.  . Stroke [facility])     a. (2012) residual R hand/arm weakness  b. while on ASA and plavix  . Diabetic neuropathy [facility])    Past Surgical History  Procedure Laterality Date  . Appendectomy  1980  . Tubal ligation  1987  . Cesarean section  1984  . Refractive surgery      x3   Family History  Problem Relation Age of Onset  . Pancreatic cancer Father   . Colon cancer Neg Hx   . Diabetes Maternal Grandfather    Social History:  reports that she has never smoked. She has never used  smokeless tobacco. She reports that she does not drink alcohol or use illicit drugs. Allergies:  Allergies  Allergen Reactions  . Erythromycin Other (See Comments)    GI upset  . Food Nausea Only    Green peas  . Metoclopramide Hcl Other (See Comments)     fatique, depression   Medications Prior to Admission  Medication Sig Dispense  Refill  . aspirin EC 81 MG tablet Take 81 mg by mouth at bedtime.     . Calcium Carb-Cholecalciferol (CALCIUM 500 +D PO) Take 500 mg by mouth daily with lunch.    . carvedilol (COREG) 6.25 MG tablet Take 6.25 mg by mouth 2 (two) times daily.    . citalopram (CELEXA) 20 MG tablet Take 20 mg by mouth daily.     . clopidogrel (PLAVIX) 75 MG tablet Take 1 tablet (75 mg total) by mouth daily. 30 tablet 6  . digoxin (LANOXIN) 0.125 MG tablet Take 0.0625 mg by mouth every other day.    . DULoxetine (CYMBALTA) 30 MG capsule Take 30 mg by mouth daily.     . ferrous sulfate 325 (65 FE) MG tablet Take 650 mg by mouth daily with supper.     . furosemide (LASIX) 20 MG tablet TAKE 1 TABLET (20 MG TOTAL) BY MOUTH DAILY. 30 tablet 6  . Insulin Degludec (TRESIBA FLEXTOUCH) 200 UNIT/ML SOPN Inject 20 Units into the skin daily after breakfast.    . insulin lispro (HUMALOG KWIKPEN) 100 UNIT/ML KiwkPen Inject 1-7 Units into the skin 3 (three) times daily as needed (CBG >150). Per sliding scale: CBG 151-200 1 unit, 201-250 2 units, 251-300 3 units, 301-350 5 units, >350 7 units    . LORazepam (ATIVAN) 0.5 MG tablet Take 0.5 mg by mouth See admin instructions. Take 1 tablet (0.5 mg) by mouth daily at bedtime, may also take 1 tablet (0.5 mg) in the morning as needed for anxiety    . Multiple Vitamin (MULTIVITAMIN WITH MINERALS) TABS tablet Take 1 tablet by mouth daily with lunch.    . rosuvastatin (CRESTOR) 40 MG tablet Take 40 mg by mouth daily with supper.  30 tablet 11  . Vitamin D, Ergocalciferol, (DRISDOL) 50000 UNITS CAPS Take 50,000 Units by mouth every 7 (seven) days. Saturday      Home: Home Living Family/patient expects to be discharged to:: Private residence Living Arrangements: Parent Available Help at Discharge: Family, Available 24 hours/day Type of Home: House Home Access: [place]: One level Bathroom Shower/Tub: Tub/shower unit, Architectural technologist: Standard Bathroom  Accessibility: Yes Home Equipment: Environmental consultant - 2 wheels, Cane - quad, Shower seat Additional Comments: pt's mother is 18 yo, is independent and does the driving, though she recently had a CABG. Pt reports her mother drives and does all the cooking while she helps with cleaning of home.   Functional History: Prior Function Level of Independence: Independent with assistive device(s) Comments: Pt reports ambulation with quad cane or RW at baseline, shower seat for bathing, R sided weakness  Functional Status:  Mobility: Bed Mobility Overal bed mobility: Modified Independent General bed mobility comments: seated in recliner chair upon entering the room Transfers Overall transfer level: Needs assistance Equipment used: Rolling walker (2 wheeled) Transfers: Sit to/from Stand Sit to Stand: Min assist General transfer comment: Min A to stand from recliner chair with min verbal cues for hand placement and technique Ambulation/Gait Ambulation/Gait assistance: Min assist Ambulation Distance (Feet): 100 Feet Assistive device: Rolling walker (2 wheeled) Gait Pattern/deviations: Decreased  step length - right General Gait Details: Gauze taped over left eye for session which corrected diplopia. pt drifts right, vc's needed to correct. Multiple posterior LOB with min A to correct, noted right knee weakness Gait velocity: decreased Gait velocity interpretation: <1.8 ft/sec, indicative of risk for recurrent falls    ADL: ADL Overall ADL's : Needs assistance/impaired Eating/Feeding: Set up Eating/Feeding Details (indicate cue type and reason): opening containers and cutting food Grooming: Oral care, Supervision/safety, Standing Grooming Details (indicate cue type and reason): Pt requiring min cues for initiation and sequencing of tasks. Pt needed min A for standing balance at sink side as she exhibits R lean in standing.  Lower Body Dressing: Min guard, Sitting/lateral leans, Sit to/from stand Lower  Body Dressing Details (indicate cue type and reason): socks only this session. Pt seated and able to don/doff B socks with increased time and steady assist for balance.  General ADL Comments: Pt with decreased safety awareness this session. L eye occluded for double vision concerns with ambulation. No c/o nausea with movement. Pt standing at sink for grooming tasks with R lateral lean and min A needed for standing balance with functional task.  Cognition: Cognition Overall Cognitive Status: Impaired/Different from baseline Orientation Level: Oriented X4 Cognition Arousal/Alertness: Awake/alert Behavior During Therapy: WFL for tasks assessed/performed Overall Cognitive Status: Impaired/Different from baseline Area of Impairment: Problem solving, Safety/judgement Safety/Judgement: Decreased awareness of safety Problem Solving: Slow processing, Requires verbal cues, Difficulty sequencing, Decreased initiation General Comments: unsure if processing is baseline  Physical Exam: Blood pressure 124/66, pulse 77, temperature 98.2 F (36.8 C), temperature source Oral, resp. rate 16, height 5' 4"  (1.626 m), weight 70.5 kg (155 lb 6.8 oz), last menstrual period 03/13/2011, SpO2 99 %. Physical Exam Constitutional: She is oriented to person, place, and time. She appears well-developed and well-nourished.  HENT:  Head: Normocephalic and atraumatic.  Eyes: Conjunctivae and EOM are normal.  Pupils round and reactive to light  Neck: Normal range of motion. Neck supple. No thyromegaly present.  Cardiovascular: Normal rate and regular rhythm.  Respiratory: Effort normal and breath sounds normal. No respiratory distress.  GI: Soft. Bowel sounds are normal. She exhibits no distension.  Musculoskeletal: She exhibits no edema or tenderness.  Neurological: She is alert and oriented to person, place, and time.  Makes good eye contact with examiner.  Follows simple commands.  Fair awareness of  deficits. Motor:LUE/LLE 5/5 proximal to distal RUE/RLE: 4+/5 proximal to distal Sensation intact to light touch Skin: Skin is warm and dry.  Psychiatric: She has a normal mood and affect. Her behavior is normal  Results for orders placed or performed during the hospital encounter of 05/22/15 (from the past 48 hour(s))  CBC with Differential     Status: None   Collection Time: 05/22/15  2:14 PM  Result Value Ref Range   WBC 7.4 4.0 - 10.5 K/uL   RBC 4.73 3.87 - 5.11 MIL/uL   Hemoglobin 14.3 12.0 - 15.0 g/dL   HCT 43.3 36.0 - 46.0 %   MCV 91.5 78.0 - 100.0 fL   MCH 30.2 26.0 - 34.0 pg   MCHC 33.0 30.0 - 36.0 g/dL   RDW 12.9 11.5 - 15.5 %   Platelets 220 150 - 400 K/uL   Neutrophils Relative % 62 %   Neutro Abs 4.5 1.7 - 7.7 K/uL   Lymphocytes Relative 24 %   Lymphs Abs 1.8 0.7 - 4.0 K/uL   Monocytes Relative 6 %   Monocytes Absolute 0.4  0.1 - 1.0 K/uL   Eosinophils Relative 8 %   Eosinophils Absolute 0.6 0.0 - 0.7 K/uL   Basophils Relative 0 %   Basophils Absolute 0.0 0.0 - 0.1 K/uL  Basic metabolic panel     Status: Abnormal   Collection Time: 05/22/15  2:14 PM  Result Value Ref Range   Sodium 145 135 - 145 mmol/L   Potassium 4.0 3.5 - 5.1 mmol/L   Chloride 108 101 - 111 mmol/L   CO2 29 22 - 32 mmol/L   Glucose, Bld 121 (H) 65 - 99 mg/dL   BUN 19 6 - 20 mg/dL   Creatinine, Ser 1.13 (H) 0.44 - 1.00 mg/dL   Calcium 9.3 8.9 - 10.3 mg/dL   GFR calc non Af Amer 53 (L) >60 mL/min   GFR calc Af Amer >60 >60 mL/min    Comment: (NOTE) The eGFR has been calculated using the CKD EPI equation. This calculation has not been validated in all clinical situations. eGFR's persistently <60 mL/min signify possible Chronic Kidney Disease.    Anion gap 8 5 - 15  CBG monitoring, ED     Status: None   Collection Time: 05/22/15  4:25 PM  Result Value Ref Range   Glucose-Capillary 66 65 - 99 mg/dL  CBG monitoring, ED     Status: Abnormal   Collection Time: 05/22/15  5:42 PM  Result  Value Ref Range   Glucose-Capillary 130 (H) 65 - 99 mg/dL  Glucose, capillary     Status: Abnormal   Collection Time: 05/22/15  8:03 PM  Result Value Ref Range   Glucose-Capillary 129 (H) 65 - 99 mg/dL   Comment 1 Notify RN    Comment 2 Document in Chart   Lipid panel     Status: None   Collection Time: 05/23/15  5:00 AM  Result Value Ref Range   Cholesterol 126 0 - 200 mg/dL   Triglycerides 115 <150 mg/dL   HDL 45 >40 mg/dL   Total CHOL/HDL Ratio 2.8 RATIO   VLDL 23 0 - 40 mg/dL   LDL Cholesterol 58 0 - 99 mg/dL    Comment:        Total Cholesterol/HDL:CHD Risk Coronary Heart Disease Risk Table                     Men   Women  1/2 Average Risk   3.4   3.3  Average Risk       5.0   4.4  2 X Average Risk   9.6   7.1  3 X Average Risk  23.4   11.0        Use the calculated Patient Ratio above and the CHD Risk Table to determine the patient's CHD Risk.        ATP III CLASSIFICATION (LDL):  <100     mg/dL   Optimal  100-129  mg/dL   Near or Above                    Optimal  130-159  mg/dL   Borderline  160-189  mg/dL   High  >190     mg/dL   Very High   Hemoglobin A1c     Status: Abnormal   Collection Time: 05/23/15  5:10 AM  Result Value Ref Range   Hgb A1c MFr Bld 11.0 (H) 4.8 - 5.6 %    Comment: (NOTE)         Pre-diabetes: 5.7 -  6.4         Diabetes: >6.4         Glycemic control for adults with diabetes: <7.0    Mean Plasma Glucose 269 mg/dL    Comment: (NOTE) Performed At: [facility] [place], [place] [id number] Lindon Romp MD KX:[id number]   Glucose, capillary     Status: Abnormal   Collection Time: 05/23/15  5:22 AM  Result Value Ref Range   Glucose-Capillary 26 (LL) 65 - 99 mg/dL  Glucose, capillary     Status: Abnormal   Collection Time: 05/23/15  5:45 AM  Result Value Ref Range   Glucose-Capillary 110 (H) 65 - 99 mg/dL  CBC     Status: Abnormal   Collection Time: 05/23/15  8:15 AM  Result Value Ref Range   WBC 7.7 4.0  - 10.5 K/uL   RBC 4.96 3.87 - 5.11 MIL/uL   Hemoglobin 15.4 (H) 12.0 - 15.0 g/dL   HCT 45.2 36.0 - 46.0 %   MCV 91.1 78.0 - 100.0 fL   MCH 31.0 26.0 - 34.0 pg   MCHC 34.1 30.0 - 36.0 g/dL   RDW 13.1 11.5 - 15.5 %   Platelets 236 150 - 400 K/uL  Comprehensive metabolic panel     Status: Abnormal   Collection Time: 05/23/15  8:15 AM  Result Value Ref Range   Sodium 143 135 - 145 mmol/L   Potassium 4.2 3.5 - 5.1 mmol/L   Chloride 107 101 - 111 mmol/L   CO2 24 22 - 32 mmol/L   Glucose, Bld 204 (H) 65 - 99 mg/dL   BUN 18 6 - 20 mg/dL   Creatinine, Ser 1.12 (H) 0.44 - 1.00 mg/dL   Calcium 9.7 8.9 - 10.3 mg/dL   Total Protein 6.8 6.5 - 8.1 g/dL   Albumin 3.5 3.5 - 5.0 g/dL   AST 22 15 - 41 U/L   ALT 18 14 - 54 U/L   Alkaline Phosphatase 56 38 - 126 U/L   Total Bilirubin 0.7 0.3 - 1.2 mg/dL   GFR calc non Af Amer 54 (L) >60 mL/min   GFR calc Af Amer >60 >60 mL/min    Comment: (NOTE) The eGFR has been calculated using the CKD EPI equation. This calculation has not been validated in all clinical situations. eGFR's persistently <60 mL/min signify possible Chronic Kidney Disease.    Anion gap 12 5 - 15  Lactic acid, plasma     Status: None   Collection Time: 05/23/15  8:15 AM  Result Value Ref Range   Lactic Acid, Venous 1.9 0.5 - 2.0 mmol/L  Lactic acid, plasma     Status: None   Collection Time: 05/23/15 11:50 AM  Result Value Ref Range   Lactic Acid, Venous 1.4 0.5 - 2.0 mmol/L  Glucose, capillary     Status: Abnormal   Collection Time: 05/23/15 12:21 PM  Result Value Ref Range   Glucose-Capillary 192 (H) 65 - 99 mg/dL   Comment 1 Notify RN    Comment 2 Document in Chart   Glucose, capillary     Status: Abnormal   Collection Time: 05/23/15  4:35 PM  Result Value Ref Range   Glucose-Capillary 279 (H) 65 - 99 mg/dL   Comment 1 Notify RN    Comment 2 Document in Chart   Glucose, capillary     Status: Abnormal   Collection Time: 05/23/15 10:10 PM  Result Value Ref Range    Glucose-Capillary 268 (  H) 65 - 99 mg/dL   Comment 1 Notify RN    Comment 2 Document in Chart    Ct Angio Head W/cm &/or Wo Cm  05/23/2015  CLINICAL DATA:  Diplopia. Generalized weakness and blurred vision. Small acute infarcts in the midbrain and pons on brain MRI. EXAM: CT ANGIOGRAPHY HEAD TECHNIQUE: Multidetector CT imaging of the head was performed using the standard protocol during bolus administration of intravenous contrast. Multiplanar CT image reconstructions and MIPs were obtained to evaluate the vascular anatomy. CONTRAST:  69m OMNIPAQUE IOHEXOL 350 MG/ML SOLN COMPARISON:  Brain MRI earlier today FINDINGS: CTA HEAD Anterior circulation: Internal carotid arteries are patent from skullbase to carotid termini with mild atherosclerotic plaque bilaterally. There is mild right proximal supraclinoid ICA stenosis. ACAs and MCAs are patent without evidence of major branch occlusion or significant stenosis. No intracranial aneurysm is identified. Posterior circulation: The visualized distal vertebral arteries are patent with the left being dominant. There is mild atherosclerotic irregularity of the left V4 segment without stenosis. AICA and SCA origins are patent. Basilar artery is patent without stenosis. There is a medium-sized left posterior communicating artery with slight hypoplasia of the left P1 segment. There is also a tiny right posterior communicating artery. PCAs are patent with mild branch vessel irregularity but no significant proximal stenosis. Venous sinuses: Patent. Anatomic variants: None. Delayed phase: No abnormal enhancement. Small acute brainstem infarcts on MRI are not well seen by CT. Chronic small vessel ischemic changes are noted in the cerebral white matter bilaterally. Mildly advanced cerebral atrophy for age. IMPRESSION: 1. No major intracranial arterial occlusion. 2. Mild intracranial atherosclerosis with mild right ICA stenosis. Electronically Signed   By: ALogan BoresM.D.   On:  05/23/2015 10:23   Dg Chest 2 View  05/23/2015  CLINICAL DATA:  Weakness .  Fever. EXAM: CHEST  2 VIEW COMPARISON:  09/15/2014 . FINDINGS: Mediastinum and hilar structures normal. Lungs are clear. Heart size normal. No pleural effusion or pneumothorax. Old right seventh rib fracture. Chest is stable from prior exam. IMPRESSION: No acute cardiopulmonary disease. Electronically Signed   By: TMarcello Moores Register   On: 05/23/2015 07:39   Ct Head Wo Contrast  05/22/2015  CLINICAL DATA:  Double vision at of left eye since yesterday. EXAM: CT HEAD WITHOUT CONTRAST TECHNIQUE: Contiguous axial images were obtained from the base of the skull through the vertex without intravenous contrast. COMPARISON:  05/26/2013. FINDINGS: There is no evidence for acute hemorrhage, hydrocephalus, mass lesion, or abnormal extra-axial fluid collection. No definite CT evidence for acute infarction. Patchy low attenuation in the deep hemispheric and periventricular white matter is nonspecific, but likely reflects chronic microvascular ischemic demyelination. Diffuse loss of parenchymal volume is consistent with atrophy. The visualized paranasal sinuses and mastoid air cells are clear. IMPRESSION: Stable exam. Mild atrophy for age with chronic small vessel white matter ischemic demyelination. No acute intracranial abnormality. Electronically Signed   By: EMisty StanleyM.D.   On: 05/22/2015 15:25   Mr BJeri CosWORContrast  05/23/2015  CLINICAL DATA:  55year old hypertensive diabetic female with anemia and hyperlipidemia presenting with double vision. Recent fall. Subsequent encounter. EXAM: MRI HEAD WITHOUT AND WITH CONTRAST TECHNIQUE: Multiplanar, multiecho pulse sequences of the brain and surrounding structures were obtained without and with intravenous contrast. CONTRAST:  113mMULTIHANCE GADOBENATE DIMEGLUMINE 529 MG/ML IV SOLN COMPARISON:  05/22/2015 CT.  08/28/2010 MR. FINDINGS: Sequences are motion degraded. Patient was not able to  complete post-contrast coronal imaging. Acute small nonhemorrhagic infarcts  left paracentral midbrain (extending to the inferior medial aspect left peri third ventricular region) and posterior left paracentral pons. Remote small infarcts scattered throughout the centrum semi ovale/coronal radiata bilaterally. Remote thalamic infarcts. Chronic small vessel disease changes. Global atrophy without hydrocephalus. Mega cisterna magna incidentally noted. No intracranial mass or abnormal enhancement noted on motion degraded exam. Major intracranial vascular structures are patent. Partial opacification right mastoid air cells without obstructing lesion of eustachian tube noted. Minimal mucosal thickening ethmoid sinus air cells and inferior maxillary sinuses. Minimal exophthalmos.  Appearance of prior lens replacement. Slightly heterogeneous bone marrow may be related to patient's habitus or underlying anemia IMPRESSION: Sequences are motion degraded. Patient was not able to complete post-contrast coronal imaging. Acute small nonhemorrhagic infarcts left paracentral midbrain and posterior left paracentral pons. Remote small infarcts scattered throughout the centrum semiovale/coronal radiata bilaterally. Remote thalamic infarcts. Chronic small vessel disease changes. Global atrophy without hydrocephalus. No intracranial mass or abnormal enhancement noted on motion degraded exam. Partial opacification right mastoid air cells without obstructing lesion of eustachian tube noted. Minimal mucosal thickening ethmoid sinus air cells and inferior maxillary sinuses. Electronically Signed   By: Genia Del M.D.   On: 05/23/2015 10:02    Medical Problem List and Plan: 1.  Diplopia, gait deficits secondary to left paracentral-midbrain and posterior left paracentral pontine infarct 2.  DVT Prophylaxis/Anticoagulation: Subcutaneous Lovenox. Monitor platelet counts and any signs of bleeding 3. Pain Management: Cymbalta 30 mg  daily 4. Mood: Celexa 20 mg daily. Provide emotional support 5. Neuropsych: This patient is capable of making decisions on her own behalf. 6. Skin/Wound Care: Routine skin checks 7. Fluids/Electrolytes/Nutrition: Routine I&O with follow up labs 8. Hypertension. Coreg 6.25 mg twice a day, Lanoxin 0.0625 mg every other day. Monitor with increased mobility 9. Chronic systolic congestive heart failure with ischemic cardiomyopathy/NSTEMI. Lasix 20 mg daily. Monitor for any signs of fluid overload 10. Diabetes mellitus and peripheral neuropathy. Hemoglobin A1c 11. Levemir 12 units daily. Check blood sugars before meals and at bedtime. Diabetic teaching 11. Iron deficiency anemia. Ferrous sulfate 650 mg daily. 12. Hyperlipidemia. Crestor  Post Admission Physician Evaluation: 1. Functional deficits secondary to left paracentral-midbrain and posterior left paracentral pontine infarct. 2. Patient is admitted to receive collaborative, interdisciplinary care between the physiatrist, rehab nursing staff, and therapy team. 3. Patient's level of medical complexity and substantial therapy needs in context of that medical necessity cannot be provided at a lesser intensity of care such as a SNF. 4. Patient has experienced substantial functional loss from his/her baseline which was documented above under the "Functional History" and "Functional Status" headings.  Judging by the patient's diagnosis, physical exam, and functional history, the patient has potential for functional progress which will result in measurable gains while on inpatient rehab.  These gains will be of substantial and practical use upon discharge  in facilitating mobility and self-care at the household level. 5. Physiatrist will provide 24 hour management of medical needs as well as oversight of the therapy plan/treatment and provide guidance as appropriate regarding the interaction of the two. 6. 24 hour rehab nursing will assist with safety,  disease management and patient education and help integrate therapy concepts, techniques,education, etc. 7. PT will assess and treat for/with: Lower extremity strength, range of motion, stamina, balance, functional mobility, safety, adaptive techniques and equipment, coping skills, pain control, stroke education.   Goals are: Mod I/Supervision. 8. OT will assess and treat for/with: ADL's, functional mobility, safety, upper extremity strength, adaptive techniques and equipment,  ego support, and community reintegration.   Goals are: Mod I. Therapy may proceed with showering this patient. 9. Case Management and Social Worker will assess and treat for psychological issues and discharge planning. 10. Team conference will be held weekly to assess progress toward goals and to determine barriers to discharge. 11. Patient will receive at least 3 hours of therapy per day at least 5 days per week. 12. ELOS: 7-10 days.       13. Prognosis:  good  Delice Lesch, MD 05/24/2015

## 2015-05-24 NOTE — Progress Notes (Signed)
Physical Therapy Treatment Patient Details Name: [name] MRN: [id number] DOB: 28-Feb-1961 Today's Date: 05/24/2015    History of Present Illness [name] is a 55 y.o. female with history of chronic systolic heart failure, coronary artery disease being medically managed, CVA, type I diabetes mellitus on insulin and hyperlipidemia presenting with double vision and a fall. She did not receive IV t-PA due to delay in arrival. MRI: left paracentral midbrain and posterior L paracentral pontine infarct secondary to small vessel disease source     PT Comments    Patient eager to work with therapy. Used eye patch throughout session due to vertical diplopia. Her sense of midline continues to be shifted to the right (resulting in right lean and resisting weight shifts to her left).   Follow Up Recommendations  CIR     Equipment Recommendations  None recommended by PT    Recommendations for Other Services       Precautions / Restrictions Precautions Precautions: Fall Restrictions Weight Bearing Restrictions: No    Mobility  Bed Mobility Overal bed mobility: Needs Assistance Bed Mobility: Supine to Sit     Supine to sit: Supervision        Transfers Overall transfer level: Needs assistance Equipment used: Rolling walker (2 wheeled);None Transfers: Sit to/from Stand Sit to Stand: Mod assist         General transfer comment: pt with inadequate forward lean/weight shift and requires incr assist to prevent falling back down into sitting; repeated for exercise x 8  Ambulation/Gait Ambulation/Gait assistance: Min assist;Mod assist Ambulation Distance (Feet): 50 Feet (standing rest, 35) Assistive device: Rolling walker (2 wheeled);1 person hand held assist Gait Pattern/deviations: Step-through pattern;Decreased stride length;Decreased weight shift to left;Drifts right/left;Wide base of support Gait velocity: decreased   General Gait Details: Using eye patch, began with RW  and pt with running into objects on her right; tends to walk behind walker; pt reports she normally uses a cane and tried HHA on her right; pt with incr Rt lean required up to mod assist to recover    Stairs            Wheelchair Mobility    Modified Rankin (Stroke Patients Only) Modified Rankin (Stroke Patients Only) Pre-Morbid Rankin Score: Moderate disability Modified Rankin: Moderately severe disability     Balance     Sitting balance-Leahy Scale: Fair     Standing balance support: No upper extremity supported Standing balance-Leahy Scale: Poor Standing balance comment: posterior and Rt lean; worked on standing balance and wt-shift to midline after each sit to stand with pt remaining leaning to her Rt; when assisted to midline, pt feels she is falling to her left                    Cognition Arousal/Alertness: Awake/alert Behavior During Therapy: WFL for tasks assessed/performed Overall Cognitive Status: No family/caregiver present to determine baseline cognitive functioning Area of Impairment: Problem solving;Memory     Memory: Decreased short-term memory (pt reports problem at baseline) Following Commands:  (follows commands inconsistently) Safety/Judgement: Decreased awareness of safety   Problem Solving: Slow processing;Requires verbal cues General Comments: slow processing noted, may be her baseline    Exercises General Exercises - Lower Extremity Ankle Circles/Pumps: AROM;Both;10 reps Long Arc Quad: AROM;Both;10 reps Mini-Sqauts: AAROM;Both;10 reps (assist for balance) Other Exercises Other Exercises: sit to stand x 8 working on weight shift to midline; first five pt used armrests; remaining three pt placed hands on her thihgs  and needed incr assist for "lift off" from seat of chair, anterior wt-shift (both up to stand and down to sit)    General Comments        Pertinent Vitals/Pain Pain Assessment: No/denies pain    Home Living      Available Help at Discharge: Family;Available 24 hours/day Type of Home: House              Prior Function            PT Goals (current goals can now be found in the care plan section) Acute Rehab PT Goals Patient Stated Goal: stop seeing double Time For Goal Achievement: 06/06/15 Progress towards PT goals: Progressing toward goals    Frequency  Min 4X/week    PT Plan Current plan remains appropriate    Co-evaluation             End of Session Equipment Utilized During Treatment: Gait belt Activity Tolerance: Patient tolerated treatment well Patient left: in chair;with call bell/phone within reach;with chair alarm set     Time: 4098-1191 PT Time Calculation (min) (ACUTE ONLY): 22 min  Charges:  $Gait Training: 8-22 mins                    G Codes:      Jakeim Sedore 05/29/2015, 11:49 AM Pager [phone]

## 2015-05-24 NOTE — Progress Notes (Addendum)
Occupational Therapy Treatment Patient Details Name: [name] MRN: [id number] DOB: [birth year] Today's Date: 05/24/2015    History of present illness Ms. [name] is a 55 y.o. female with history of chronic systolic heart failure, coronary artery disease being medically managed, CVA, type I diabetes mellitus on insulin and hyperlipidemia presenting with double vision and a fall. She did not receive IV t-PA due to delay in arrival. MRI: left paracentral midbrain and posterior L paracentral pontine infarct secondary to small vessel disease source    OT comments  Pt progressing. OT will continue to follow acutely. Continue to recommend CIR for rehab.  Follow Up Recommendations  CIR    Equipment Recommendations  None recommended by OT    Recommendations for Other Services Rehab consult    Precautions / Restrictions Precautions Precautions: Fall Restrictions Weight Bearing Restrictions: No       Mobility Bed Mobility Overal bed mobility: Needs Assistance Bed Mobility: Supine to Sit     Supine to sit: Supervision        Transfers Overall transfer level: Needs assistance Equipment used: Rolling walker (2 wheeled) Transfers: Sit to/from Stand Sit to Stand: Min assist         General transfer comment: Min guard-Min assist for sit to stand transfer. Cues given for hand placement/technique.    Balance      used RW for ambulation. Performed functional tasks while standing with RW in front, with supervision-Min guard assist.                             ADL Overall ADL's : Needs assistance/impaired     Grooming: Wash/dry face;Sitting;Applying deodorant;Min guard;Standing;Oral care (Set up/Supervision-Min guard assist)   Upper Body Bathing: Set up;Supervision/ safety;Sitting       Upper Body Dressing : Minimal assistance;Sitting   Lower Body Dressing: Set up;Sitting/lateral leans Lower Body Dressing Details (indicate cue type and reason): donned/doffed  socks Toilet Transfer: Minimal assistance;Ambulation;RW (Min guard-Min assist for sit to stand from chair/bed; Min guard for ambulation with RW)            Functional mobility during ADLs: Min guard;Rolling walker General ADL Comments: pt leaning to right at sink while standing-cues given to try to correct posture. Pt donned eye patch when in bed prior to getting up.      Vision                     Perception     Praxis      Cognition  Awake/Alert Behavior During Therapy: WFL for tasks assessed/performed Overall Cognitive Status: No family/caregiver present to determine baseline cognitive functioning Area of Impairment: Following commands;Memory;Safety/judgement     Memory: Decreased short-term memory  Following Commands:  (follows commands inconsistently) Safety/Judgement: Decreased awareness of safety     General Comments: cues given for hand placement for transfer.    Extremity/Trunk Assessment               Exercises     Shoulder Instructions       General Comments      Pertinent Vitals/ Pain       Pain Assessment: No/denies pain  Home Living                                          Prior Functioning/Environment  Frequency Min 2X/week     Progress Toward Goals  OT Goals(current goals can now be found in the care plan section)  Progress towards OT goals: Progressing toward goals  Acute Rehab OT Goals Patient Stated Goal: not stated OT Goal Formulation: With patient Time For Goal Achievement: 06/06/15 Potential to Achieve Goals: Fair ADL Goals Pt Will Perform Grooming: with supervision;sitting Pt Will Perform Upper Body Bathing: with supervision;sitting Pt Will Perform Lower Body Bathing: with supervision;sitting/lateral leans Pt Will Perform Upper Body Dressing: with supervision;sitting Pt Will Perform Lower Body Dressing: with supervision;sitting/lateral leans Pt Will Transfer to Toilet: with  supervision;regular height toilet;ambulating Pt Will Perform Toileting - Clothing Manipulation and hygiene: with supervision;sit to/from stand Pt Will Perform Tub/Shower Transfer: with min guard assist;rolling walker;shower seat  Plan Discharge plan remains appropriate    Co-evaluation                 End of Session Equipment Utilized During Treatment: Gait belt;Rolling walker;Other (comment) (eye patch)   Activity Tolerance Patient tolerated treatment well   Patient Left in chair;with chair alarm set;with call bell/phone within reach   Nurse Communication          Time: 1610-9604 OT Time Calculation (min): 17 min  Charges: OT General Charges $OT Visit: 1 Procedure OT Treatments $Self Care/Home Management : 8-22 mins   Earlie Raveling OTR/L [phone] 05/24/2015, 10:15 AM

## 2015-05-24 NOTE — Progress Notes (Signed)
  Echocardiogram 2D Echocardiogram has been performed.  [name] 05/24/2015, 2:08 PM

## 2015-05-24 NOTE — Progress Notes (Signed)
Inpatient Diabetes Program Recommendations  AACE/ADA: New Consensus Statement on Inpatient Glycemic Control (2015)  Target Ranges:  Prepandial:   less than 140 mg/dL      Peak postprandial:   less than 180 mg/dL (1-2 hours)      Critically ill patients:  140 - 180 mg/dL   Results for MENNIE, SPILLER (MRN [id number]) as of 05/24/2015 08:25  Ref. Range 05/23/2015 05:22 05/23/2015 05:45 05/23/2015 12:21 05/23/2015 16:35 05/23/2015 22:10 05/24/2015 06:55  Glucose-Capillary Latest Ref Range: 65-99 mg/dL 26 (LL) 629 (H) 528 (H) 279 (H) 268 (H) 154 (H)   Review of Glycemic Control  Diabetes history: DM 2 Outpatient Diabetes medications: Tresiba 20 units, Humalog 1-7 units TID Current orders for Inpatient glycemic control: Levemir 8 units, Novolog Moderate + HS scale  Inpatient Diabetes Program Recommendations: Insulin - Basal: Patient's home dose of Evaristo Bury should be out of her system now, Consider increasing Levemir back up to 18 units Daily.  Glucose increased to 200's around meal times yesterday, may need to add meal coverage, Novolog 3 units TID.  Thanks,  Christena Deem RN, MSN, [facility] Inpatient Diabetes Coordinator Team Pager [phone] (8a-5p)

## 2015-05-24 NOTE — Progress Notes (Addendum)
STROKE TEAM PROGRESS NOTE   SUBJECTIVE (INTERVAL HISTORY) Patient sitting up in the chair, working with SLP. She does not feel her vision is improved. She likes her patch. It helps.    OBJECTIVE Temp:  [98.1 F (36.7 C)-98.6 F (37 C)] 98.1 F (36.7 C) (01/31 1013) Pulse Rate:  [72-79] 72 (01/31 1013) Cardiac Rhythm:  [-] Normal sinus rhythm (01/31 0724) Resp:  [15-17] 17 (01/31 1013) BP: (106-142)/(59-66) 106/63 mmHg (01/31 1013) SpO2:  [97 %-100 %] 100 % (01/31 1013)  CBC:   Recent Labs Lab 05/22/15 1414 05/23/15 0815  WBC 7.4 7.7  NEUTROABS 4.5  --   HGB 14.3 15.4*  HCT 43.3 45.2  MCV 91.5 91.1  PLT 220 236    Basic Metabolic Panel:   Recent Labs Lab 05/22/15 1414 05/23/15 0815  NA 145 143  K 4.0 4.2  CL 108 107  CO2 29 24  GLUCOSE 121* 204*  BUN 19 18  CREATININE 1.13* 1.12*  CALCIUM 9.3 9.7    Lipid Panel:     Component Value Date/Time   CHOL 126 05/23/2015 0500   TRIG 115 05/23/2015 0500   HDL 45 05/23/2015 0500   CHOLHDL 2.8 05/23/2015 0500   VLDL 23 05/23/2015 0500   LDLCALC 58 05/23/2015 0500   HgbA1c:  Lab Results  Component Value Date   HGBA1C 11.0* 05/23/2015   Urine Drug Screen:     Component Value Date/Time   LABOPIA NONE DETECTED 02/14/2013 1948   COCAINSCRNUR NONE DETECTED 02/14/2013 1948   LABBENZ POSITIVE* 02/14/2013 1948   AMPHETMU NONE DETECTED 02/14/2013 1948   THCU NONE DETECTED 02/14/2013 1948   LABBARB NONE DETECTED 02/14/2013 1948      IMAGING  Ct Head Wo Contrast 05/22/2015  Stable exam. Mild atrophy for age with chronic small vessel white matter ischemic demyelination. No acute intracranial abnormality.   Mr [name] Contrast 05/23/2015  Sequences are motion degraded. Patient was not able to complete post-contrast coronal imaging. Acute small nonhemorrhagic infarcts left paracentral midbrain and posterior left paracentral pons. Remote small infarcts scattered throughout the centrum semiovale/coronal radiata  bilaterally. Remote thalamic infarcts. Chronic small vessel disease changes. Global atrophy without hydrocephalus. No intracranial mass or abnormal enhancement noted on motion degraded exam. Partial opacification right mastoid air cells without obstructing lesion of eustachian tube noted. Minimal mucosal thickening ethmoid sinus air cells and inferior maxillary sinuses.   CTA head 05/23/2015 1. No major intracranial arterial occlusion. 2. Mild intracranial atherosclerosis with mild right ICA stenosis.  CUS - There is no obvious evidence of hemodynamically significant internal carotid artery stenosis bilaterally. Vertebral arteries are patent with antegrade flow.  TTE - - Left ventricle: The cavity size was normal. Wall thickness was normal. Systolic function was moderately reduced. The estimated ejection fraction was in the range of 35% to 40%. There is akinesis of the anteroseptal and apical myocardium. There was no evidence of elevated ventricular filling pressure by Doppler parameters. Impressions: - No cardiac source of emboli was indentified. Compared to the prior study, there has been no significant interval change.   PHYSICAL EXAM General - Well nourished, well developed, in no apparent distress.  Ophthalmologic - Fundi not visualized due to eye movement.  Cardiovascular - Regular rate and rhythm with no murmur.  Mental Status -  Level of arousal and orientation to time, place, and person were intact. Language including expression, naming, repetition, comprehension was assessed and found intact. Fund of Knowledge was assessed and was intact.  Cranial  Nerves II - XII - II - Visual field intact OU. III, IV, VI - Extraocular movements no significant deficit on horizontal or vertical movement bilaterally, however, pt does complain of diplopia on distal looking and left gaze. V - Facial sensation intact bilaterally. VII - Facial movement intact bilaterally. VIII -  Hearing & vestibular intact bilaterally. X - Palate elevates symmetrically. XI - Chin turning & shoulder shrug intact bilaterally. XII - Tongue protrusion intact.  Motor Strength - The patient's strength was normal in all extremities except right UE proximal 5-/5, distal 4/5, with dexterity difficulty and pronator drift on the right.  Bulk was normal and fasciculations were absent.   Motor Tone - Muscle tone was assessed at the neck and appendages and was normal.  Reflexes - The patient's reflexes were 1+ in all extremities and she had no pathological reflexes.  Sensory - Light touch, temperature/pinprick were assessed and were symmetrical.    Coordination - The patient had normal movements in the hands and feet with no ataxia or dysmetria.  Tremor was absent.  Gait and Station - deferred to PT/OT in room   ASSESSMENT/PLAN Ms. [name] is a 55 y.o. 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 She did not receive IV t-PA due to delay in arrival.   Stroke:  left paracentral midbrain and posterior L paracentral pontine infarct secondary to  small vessel disease source.  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 Continue dual antiplatelet for stroke and cardiac prevention.  Patient counseled to be compliant with her antithrombotic medications  Ongoing aggressive stroke risk factor management  Therapy  recommendations:  CIR  Disposition:  pending   Hypertension  Stable Permissive hypertension (OK if <220/120) for 24-48 hours post stroke and then gradually normalized within 5-7 days.  Hyperlipidemia  Home meds:  Crestor 40, resumed in hospital  LDL 58, at goal < 70  Resumed crestor 40  Continue statin at discharge  Diabetes type I Diabetic Neuropathy Diabetic reinopathy  HgbA1c 11.0, goal < 7.0  DM not in good control  One time hypoglycemia at 26, recommended dose adjustment  Followed by Dr. Evlyn Kanner  [facility]  Diabetes RN following - recommended increase in levemir to 18u daily as well as meal coverage Novolog 3 u tid  Cardiomyopathy, likely ischemic  EF 35-40%  No significant change from prior (although EF 40-45% in 06/2013)  Pt has follow up with cardiology  Needs close follow up with cardiology  Other Stroke Risk Factors  Hx stroke/TIA  08/2010 small L parietal infarct w/ resultant R hand/arm weakness  Coronary artery disease - MI 2011 and 2012  Other Active Problems  Chronic systolic HF  Hospital day # 2  Neurology will sign off. Please call with questions. Pt will follow up with Darrol Angel NP at [facility] in about 1 month. Thanks for the consult.  Marvel Plan,  MD PhD Stroke Neurology 05/24/2015 5:28 PM   To contact Stroke Continuity provider, please refer to [email or web address]. After hours, contact General Neurology

## 2015-05-25 ENCOUNTER — Inpatient Hospital Stay (HOSPITAL_COMMUNITY)
Admission: AD | Admit: 2015-05-25 | Discharge: 2015-06-04 | DRG: 064 | Disposition: A | Discharge status: Home Health | Payer: Medicare Other | Source: Intra-hospital | Attending: Physical Medicine & Rehabilitation | Admitting: Physical Medicine & Rehabilitation

## 2015-05-25 DIAGNOSIS — H532 Diplopia: Secondary | ICD-10-CM | POA: Diagnosis present

## 2015-05-25 DIAGNOSIS — E785 Hyperlipidemia, unspecified: Secondary | ICD-10-CM | POA: Insufficient documentation

## 2015-05-25 DIAGNOSIS — R2689 Other abnormalities of gait and mobility: Secondary | ICD-10-CM | POA: Diagnosis present

## 2015-05-25 DIAGNOSIS — E1065 Type 1 diabetes mellitus with hyperglycemia: Secondary | ICD-10-CM | POA: Diagnosis not present

## 2015-05-25 DIAGNOSIS — G8191 Hemiplegia, unspecified affecting right dominant side: Secondary | ICD-10-CM | POA: Diagnosis not present

## 2015-05-25 DIAGNOSIS — I69931 Monoplegia of upper limb following unspecified cerebrovascular disease affecting right dominant side: Secondary | ICD-10-CM

## 2015-05-25 DIAGNOSIS — E11649 Type 2 diabetes mellitus with hypoglycemia without coma: Secondary | ICD-10-CM | POA: Diagnosis not present

## 2015-05-25 DIAGNOSIS — E119 Type 2 diabetes mellitus without complications: Secondary | ICD-10-CM | POA: Insufficient documentation

## 2015-05-25 DIAGNOSIS — I2510 Atherosclerotic heart disease of native coronary artery without angina pectoris: Secondary | ICD-10-CM | POA: Diagnosis not present

## 2015-05-25 DIAGNOSIS — E1142 Type 2 diabetes mellitus with diabetic polyneuropathy: Secondary | ICD-10-CM

## 2015-05-25 DIAGNOSIS — I6350 Cerebral infarction due to unspecified occlusion or stenosis of unspecified cerebral artery: Secondary | ICD-10-CM

## 2015-05-25 DIAGNOSIS — I10 Essential (primary) hypertension: Secondary | ICD-10-CM | POA: Diagnosis present

## 2015-05-25 DIAGNOSIS — E1051 Type 1 diabetes mellitus with diabetic peripheral angiopathy without gangrene: Secondary | ICD-10-CM | POA: Diagnosis not present

## 2015-05-25 DIAGNOSIS — I214 Non-ST elevation (NSTEMI) myocardial infarction: Secondary | ICD-10-CM | POA: Diagnosis present

## 2015-05-25 DIAGNOSIS — I255 Ischemic cardiomyopathy: Secondary | ICD-10-CM | POA: Diagnosis present

## 2015-05-25 DIAGNOSIS — I5022 Chronic systolic (congestive) heart failure: Secondary | ICD-10-CM | POA: Diagnosis present

## 2015-05-25 DIAGNOSIS — I639 Cerebral infarction, unspecified: Principal | ICD-10-CM | POA: Diagnosis present

## 2015-05-25 DIAGNOSIS — D509 Iron deficiency anemia, unspecified: Secondary | ICD-10-CM | POA: Diagnosis present

## 2015-05-25 DIAGNOSIS — F329 Major depressive disorder, single episode, unspecified: Secondary | ICD-10-CM

## 2015-05-25 LAB — CBC
HCT: 42.5 % (ref 36.0–46.0)
Hemoglobin: 14.5 g/dL (ref 12.0–15.0)
MCH: 30.9 pg (ref 26.0–34.0)
MCHC: 34.1 g/dL (ref 30.0–36.0)
MCV: 90.6 fL (ref 78.0–100.0)
PLATELETS: 211 10*3/uL (ref 150–400)
RBC: 4.69 MIL/uL (ref 3.87–5.11)
RDW: 12.7 % (ref 11.5–15.5)
WBC: 6.5 10*3/uL (ref 4.0–10.5)

## 2015-05-25 LAB — GLUCOSE, CAPILLARY
GLUCOSE-CAPILLARY: 174 mg/dL — AB (ref 65–99)
GLUCOSE-CAPILLARY: 292 mg/dL — AB (ref 65–99)
GLUCOSE-CAPILLARY: 399 mg/dL — AB (ref 65–99)
Glucose-Capillary: 333 mg/dL — ABNORMAL HIGH (ref 65–99)
Glucose-Capillary: 367 mg/dL — ABNORMAL HIGH (ref 65–99)

## 2015-05-25 LAB — CREATININE, SERUM
CREATININE: 1.3 mg/dL — AB (ref 0.44–1.00)
GFR, EST AFRICAN AMERICAN: 52 mL/min — AB (ref >60)
GFR, EST NON AFRICAN AMERICAN: 45 mL/min — AB (ref >60)

## 2015-05-25 MED ORDER — CLOPIDOGREL BISULFATE 75 MG PO TABS
75.0000 mg | ORAL_TABLET | Freq: Every day | ORAL | Status: DC
  Administered 2015-05-26 – 2015-06-04 (×10): 75 mg via ORAL
  Filled 2015-05-25 (×10): qty 1

## 2015-05-25 MED ORDER — ONDANSETRON HCL 4 MG/2ML IJ SOLN: 4.0000 mg | Freq: Four times a day (QID) | INTRAMUSCULAR | Status: DC | PRN

## 2015-05-25 MED ORDER — ASPIRIN EC 81 MG PO TBEC
81.0000 mg | DELAYED_RELEASE_TABLET | Freq: Every day | ORAL | Status: DC
  Administered 2015-05-25 – 2015-06-03 (×10): 81 mg via ORAL
  Filled 2015-05-25 (×11): qty 1

## 2015-05-25 MED ORDER — DULOXETINE HCL 30 MG PO CPEP
30.0000 mg | ORAL_CAPSULE | Freq: Every day | ORAL | Status: DC
  Administered 2015-05-26 – 2015-06-04 (×10): 30 mg via ORAL
  Filled 2015-05-25 (×10): qty 1

## 2015-05-25 MED ORDER — INSULIN ASPART 100 UNIT/ML ~~LOC~~ SOLN: 4.0000 [IU] | Freq: Three times a day (TID) | SUBCUTANEOUS | Status: DC

## 2015-05-25 MED ORDER — INSULIN ASPART 100 UNIT/ML ~~LOC~~ SOLN
0.0000 [IU] | Freq: Three times a day (TID) | SUBCUTANEOUS | Status: DC
  Administered 2015-05-26: 3 [IU] via SUBCUTANEOUS
  Administered 2015-05-26 – 2015-05-27 (×2): 5 [IU] via SUBCUTANEOUS
  Administered 2015-05-28: 11 [IU] via SUBCUTANEOUS
  Administered 2015-05-28: 5 [IU] via SUBCUTANEOUS

## 2015-05-25 MED ORDER — DIGOXIN 125 MCG PO TABS
0.0625 mg | ORAL_TABLET | ORAL | Status: DC
  Administered 2015-05-28 – 2015-06-03 (×4): 0.0625 mg via ORAL
  Filled 2015-05-25 (×4): qty 1

## 2015-05-25 MED ORDER — CARVEDILOL 6.25 MG PO TABS
6.2500 mg | ORAL_TABLET | Freq: Two times a day (BID) | ORAL | Status: DC
  Administered 2015-05-25 – 2015-06-04 (×19): 6.25 mg via ORAL
  Filled 2015-05-25 (×21): qty 1

## 2015-05-25 MED ORDER — ENOXAPARIN SODIUM 40 MG/0.4ML ~~LOC~~ SOLN
40.0000 mg | SUBCUTANEOUS | Status: DC
  Administered 2015-05-26 – 2015-06-03 (×9): 40 mg via SUBCUTANEOUS
  Filled 2015-05-25 (×9): qty 0.4

## 2015-05-25 MED ORDER — ENOXAPARIN SODIUM 40 MG/0.4ML ~~LOC~~ SOLN: 40.0000 mg | SUBCUTANEOUS | Status: DC

## 2015-05-25 MED ORDER — CITALOPRAM HYDROBROMIDE 20 MG PO TABS
20.0000 mg | ORAL_TABLET | Freq: Every day | ORAL | Status: DC
  Administered 2015-05-26 – 2015-06-04 (×10): 20 mg via ORAL
  Filled 2015-05-25 (×10): qty 1

## 2015-05-25 MED ORDER — ONDANSETRON HCL 4 MG PO TABS: 4.0000 mg | ORAL_TABLET | Freq: Four times a day (QID) | ORAL | Status: DC | PRN

## 2015-05-25 MED ORDER — ROSUVASTATIN CALCIUM 40 MG PO TABS
40.0000 mg | ORAL_TABLET | Freq: Every day | ORAL | Status: DC
  Administered 2015-05-26 – 2015-06-03 (×9): 40 mg via ORAL
  Filled 2015-05-25 (×9): qty 1

## 2015-05-25 MED ORDER — SORBITOL 70 % SOLN: 30.0000 mL | Freq: Every day | Status: DC | PRN

## 2015-05-25 MED ORDER — ACETAMINOPHEN 325 MG PO TABS: 325.0000 mg | ORAL_TABLET | ORAL | Status: DC | PRN

## 2015-05-25 MED ORDER — FERROUS SULFATE 325 (65 FE) MG PO TABS
650.0000 mg | ORAL_TABLET | Freq: Every day | ORAL | Status: DC
  Administered 2015-05-26 – 2015-06-03 (×9): 650 mg via ORAL
  Filled 2015-05-25 (×11): qty 2

## 2015-05-25 MED ORDER — INSULIN DETEMIR 100 UNIT/ML ~~LOC~~ SOLN
12.0000 [IU] | Freq: Every day | SUBCUTANEOUS | Status: DC
  Filled 2015-05-25: qty 0.12

## 2015-05-25 MED ORDER — INSULIN DETEMIR 100 UNIT/ML ~~LOC~~ SOLN: 12.0000 [IU] | Freq: Every day | SUBCUTANEOUS | Status: DC

## 2015-05-25 MED ORDER — FUROSEMIDE 20 MG PO TABS
20.0000 mg | ORAL_TABLET | Freq: Every day | ORAL | Status: DC
  Administered 2015-05-26 – 2015-06-04 (×10): 20 mg via ORAL
  Filled 2015-05-25 (×10): qty 1

## 2015-05-25 NOTE — Progress Notes (Signed)
Inpatient Rehabilitation  I have received insurance approval to admit pt. to IP Rehab.  I have discussed with Dr. Jerral Ralph and he has given permission for pt. to admit to CIR today.  I have updated Fabio Neighbors, RNCM, Derenda Fennel, CSW as well .  Pt. And her mother are agreeable.  Lebron Conners, RN is aware of the plan.  I will make all necessary arrangements.    Weldon Picking PT Inpatient Rehab Admissions Coordinator Cell [phone] Office [phone]

## 2015-05-25 NOTE — Discharge Summary (Signed)
PATIENT DETAILS Name: [name] Age: 55 y.o. Sex: female Date of Birth: 1960/10/11 MRN: [id number]. Admitting Physician: Calvert Cantor, MD NWG:[id number] ALAN, MD  Admit Date: 05/22/2015 Discharge date: 05/25/2015  Recommendations for Outpatient Follow-up:  1. Ensure follow-up with neurology and cardiology on discharge. 2. Repeat electrolytes periodically. 3. Note-change in medication-Lantus decreased to 12 units.  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 Hypertension   . Vitamin D deficiency   . Coronary artery disease     a. (12/11) NSTEMI, in the setting of DKA, s/p Cath--LAD totalled in the midportion, diag 70% sten (small 2-mm ), LCx nonobst, OM1 40%, OM2 30%, RCA- no dz. chronic calcified LAD --> no intervention.  b. (06/2010) NSTEMI s/p cath showing same findings   . Ischemic cardiomyopathy   . Orthostatic hypotension     a. sycopal episode in 12/11   . Diabetes mellitus     a. Insulin dependant Type I   . Hyperlipidemia   . Diabetic nephropathy (HCC)   . Diabetic retinopathy   . Gastroparesis   . Depression   . Systolic heart failure     a. (08/2012) EF: 40% Mod hypokin in mid-dist-ant-sept myocard--> psuedonml L vent fill pattern, Grd 2 diast dyscfxn  . Iron deficiency anemia   . Hx MRSA infection   . Osteopenia   . Esophagitis   . NSTEMI (non-ST elevated myocardial  infarction) (HCC)     a. (03/2010) NSTEMI during admission for DKA  b.  . Stroke [facility])     a. (2012) residual R hand/arm weakness  b. while on ASA and plavix  . Diabetic neuropathy (HCC)     DISCHARGE MEDICATIONS: Current Discharge Medication List    START taking these medications   Details  insulin aspart (NOVOLOG) 100 UNIT/ML injection Inject 4 Units into the skin 3 (three) times daily with meals.    insulin detemir (LEVEMIR) 100 UNIT/ML injection Inject 0.12 mLs (12 Units total) into the skin daily.      CONTINUE these medications which have NOT CHANGED   Details  aspirin EC 81 MG tablet Take 81 mg by mouth at bedtime.    Associated Diagnoses: Coronary atherosclerosis of native coronary artery; Chronic systolic heart failure (HCC)    Calcium Carb-Cholecalciferol (CALCIUM 500 +D PO) Take 500 mg by mouth daily with lunch.    carvedilol (COREG) 6.25 MG tablet Take 6.25 mg by mouth 2 (two) times daily.    citalopram (CELEXA) 20 MG tablet Take 20 mg by mouth daily.     clopidogrel (PLAVIX) 75 MG tablet Take 1 tablet (75 mg total) by mouth daily. Qty: 30 tablet, Refills: 6    digoxin (LANOXIN) 0.125 MG tablet Take 0.0625 mg by mouth every other day.    DULoxetine (CYMBALTA) 30 MG capsule Take 30 mg by mouth daily.     ferrous sulfate 325 (  65 FE) MG tablet Take 650 mg by mouth daily with supper.     furosemide (LASIX) 20 MG tablet TAKE 1 TABLET (20 MG TOTAL) BY MOUTH DAILY. Qty: 30 tablet, Refills: 6    LORazepam (ATIVAN) 0.5 MG tablet Take 0.5 mg by mouth See admin instructions. Take 1 tablet (0.5 mg) by mouth daily at bedtime, may also take 1 tablet (0.5 mg) in the morning as needed for anxiety    Multiple Vitamin (MULTIVITAMIN WITH MINERALS) TABS tablet Take 1 tablet by mouth daily with lunch.    rosuvastatin (CRESTOR) 40 MG tablet Take 40 mg by mouth daily with supper.  Qty: 30 tablet, Refills: 11    Vitamin D, Ergocalciferol, (DRISDOL) 50000 UNITS CAPS Take 50,000  Units by mouth every 7 (seven) days. Saturday      STOP taking these medications     Insulin Degludec (TRESIBA FLEXTOUCH) 200 UNIT/ML SOPN      insulin lispro (HUMALOG KWIKPEN) 100 UNIT/ML KiwkPen         ALLERGIES:   Allergies  Allergen Reactions  . Erythromycin Other (See Comments)    GI upset  . Food Nausea Only    Green peas  . Metoclopramide Hcl Other (See Comments)     fatique, depression    BRIEF HPI:  See H&P, Labs, Consult and Test reports for all details in brief, patient was admitted for evaluation of diplopia.  CONSULTATIONS:   neurology and rehabilitation medicine  PERTINENT RADIOLOGIC STUDIES: Ct Angio Head W/cm &/or Wo Cm  05/23/2015  CLINICAL DATA:  Diplopia. Generalized weakness and blurred vision. Small acute infarcts in the midbrain and pons on brain MRI. EXAM: CT ANGIOGRAPHY HEAD TECHNIQUE: Multidetector CT imaging of the head was performed using the standard protocol during bolus administration of intravenous contrast. Multiplanar CT image reconstructions and MIPs were obtained to evaluate the vascular anatomy. CONTRAST:  50mL OMNIPAQUE IOHEXOL 350 MG/ML SOLN COMPARISON:  Brain MRI earlier today FINDINGS: CTA HEAD Anterior circulation: Internal carotid arteries are patent from skullbase to carotid termini with mild atherosclerotic plaque bilaterally. There is mild right proximal supraclinoid ICA stenosis. ACAs and MCAs are patent without evidence of major branch occlusion or significant stenosis. No intracranial aneurysm is identified. Posterior circulation: The visualized distal vertebral arteries are patent with the left being dominant. There is mild atherosclerotic irregularity of the left V4 segment without stenosis. AICA and SCA origins are patent. Basilar artery is patent without stenosis. There is a medium-sized left posterior communicating artery with slight hypoplasia of the left P1 segment. There is also a tiny right posterior communicating artery. PCAs  are patent with mild branch vessel irregularity but no significant proximal stenosis. Venous sinuses: Patent. Anatomic variants: None. Delayed phase: No abnormal enhancement. Small acute brainstem infarcts on MRI are not well seen by CT. Chronic small vessel ischemic changes are noted in the cerebral white matter bilaterally. Mildly advanced cerebral atrophy for age. IMPRESSION: 1. No major intracranial arterial occlusion. 2. Mild intracranial atherosclerosis with mild right ICA stenosis. Electronically Signed   By: Sebastian Ache M.D.   On: 05/23/2015 10:23   Dg Chest 2 View  05/23/2015  CLINICAL DATA:  Weakness .  Fever. EXAM: CHEST  2 VIEW COMPARISON:  09/15/2014 . FINDINGS: Mediastinum and hilar structures normal. Lungs are clear. Heart size normal. No pleural effusion or pneumothorax. Old right seventh rib fracture. Chest is stable from prior exam. IMPRESSION: No acute cardiopulmonary disease. Electronically Signed   By: Maisie Fus  Register   On: 05/23/2015  07:39   Ct Head Wo Contrast  05/22/2015  CLINICAL DATA:  Double vision at of left eye since yesterday. EXAM: CT HEAD WITHOUT CONTRAST TECHNIQUE: Contiguous axial images were obtained from the base of the skull through the vertex without intravenous contrast. COMPARISON:  05/26/2013. FINDINGS: There is no evidence for acute hemorrhage, hydrocephalus, mass lesion, or abnormal extra-axial fluid collection. No definite CT evidence for acute infarction. Patchy low attenuation in the deep hemispheric and periventricular white matter is nonspecific, but likely reflects chronic microvascular ischemic demyelination. Diffuse loss of parenchymal volume is consistent with atrophy. The visualized paranasal sinuses and mastoid air cells are clear. IMPRESSION: Stable exam. Mild atrophy for age with chronic small vessel white matter ischemic demyelination. No acute intracranial abnormality. Electronically Signed   By: Kennith Center M.D.   On: 05/22/2015 15:25   Mr Laqueta Jean ZO Contrast  05/23/2015  CLINICAL DATA:  55 year old hypertensive diabetic female with anemia and hyperlipidemia presenting with double vision. Recent fall. Subsequent encounter. EXAM: MRI HEAD WITHOUT AND WITH CONTRAST TECHNIQUE: Multiplanar, multiecho pulse sequences of the brain and surrounding structures were obtained without and with intravenous contrast. CONTRAST:  16mL MULTIHANCE GADOBENATE DIMEGLUMINE 529 MG/ML IV SOLN COMPARISON:  05/22/2015 CT.  08/28/2010 MR. FINDINGS: Sequences are motion degraded. Patient was not able to complete post-contrast coronal imaging. Acute small nonhemorrhagic infarcts left paracentral midbrain (extending to the inferior medial aspect left peri third ventricular region) and posterior left paracentral pons. Remote small infarcts scattered throughout the centrum semi ovale/coronal radiata bilaterally. Remote thalamic infarcts. Chronic small vessel disease changes. Global atrophy without hydrocephalus. Mega cisterna magna incidentally noted. No intracranial mass or abnormal enhancement noted on motion degraded exam. Major intracranial vascular structures are patent. Partial opacification right mastoid air cells without obstructing lesion of eustachian tube noted. Minimal mucosal thickening ethmoid sinus air cells and inferior maxillary sinuses. Minimal exophthalmos.  Appearance of prior lens replacement. Slightly heterogeneous bone marrow may be related to patient's habitus or underlying anemia IMPRESSION: Sequences are motion degraded. Patient was not able to complete post-contrast coronal imaging. Acute small nonhemorrhagic infarcts left paracentral midbrain and posterior left paracentral pons. Remote small infarcts scattered throughout the centrum semiovale/coronal radiata bilaterally. Remote thalamic infarcts. Chronic small vessel disease changes. Global atrophy without hydrocephalus. No intracranial mass or abnormal enhancement noted on motion degraded exam. Partial  opacification right mastoid air cells without obstructing lesion of eustachian tube noted. Minimal mucosal thickening ethmoid sinus air cells and inferior maxillary sinuses. Electronically Signed   By: Lacy Duverney M.D.   On: 05/23/2015 10:02     PERTINENT LAB RESULTS: CBC:  Recent Labs  05/22/15 1414 05/23/15 0815  WBC 7.4 7.7  HGB 14.3 15.4*  HCT 43.3 45.2  PLT 220 236   CMET CMP     Component Value Date/Time   NA 143 05/23/2015 0815   K 4.2 05/23/2015 0815   CL 107 05/23/2015 0815   CO2 24 05/23/2015 0815   GLUCOSE 204* 05/23/2015 0815   BUN 18 05/23/2015 0815   CREATININE 1.12* 05/23/2015 0815   CALCIUM 9.7 05/23/2015 0815   PROT 6.8 05/23/2015 0815   ALBUMIN 3.5 05/23/2015 0815   AST 22 05/23/2015 0815   ALT 18 05/23/2015 0815   ALKPHOS 56 05/23/2015 0815   BILITOT 0.7 05/23/2015 0815   GFRNONAA 54* 05/23/2015 0815   GFRAA >60 05/23/2015 0815    GFR Estimated Creatinine Clearance: 54 mL/min (by C-G formula based on Cr of 1.12). No results for input(s): LIPASE,  AMYLASE in the last 72 hours. No results for input(s): CKTOTAL, CKMB, CKMBINDEX, TROPONINI in the last 72 hours. Invalid input(s): POCBNP No results for input(s): DDIMER in the last 72 hours.  Recent Labs  05/23/15 0510  HGBA1C 11.0*    Recent Labs  05/23/15 0500  CHOL 126  HDL 45  LDLCALC 58  TRIG 115  CHOLHDL 2.8   No results for input(s): TSH, T4TOTAL, T3FREE, THYROIDAB in the last 72 hours.  Invalid input(s): FREET3 No results for input(s): VITAMINB12, FOLATE, FERRITIN, TIBC, IRON, RETICCTPCT in the last 72 hours. Coags: No results for input(s): INR in the last 72 hours.  Invalid input(s): PT Microbiology: Recent Results (from the past 240 hour(s))  Culture, blood (routine x 2)     Status: None (Preliminary result)   Collection Time: 05/23/15  8:15 AM  Result Value Ref Range Status   Specimen Description BLOOD LEFT HAND  Final   Special Requests BOTTLES DRAWN AEROBIC ONLY 2CC   Final   Culture NO GROWTH 1 DAY  Final   Report Status PENDING  Incomplete  Culture, blood (routine x 2)     Status: None (Preliminary result)   Collection Time: 05/23/15  8:23 AM  Result Value Ref Range Status   Specimen Description BLOOD RIGHT HAND  Final   Special Requests BOTTLES DRAWN AEROBIC ONLY 2CC  Final   Culture NO GROWTH 1 DAY  Final   Report Status PENDING  Incomplete     BRIEF HOSPITAL COURSE:  Acute CVA: Presented with diplopia, MRI brain confirms a infarct in the left paracentral midbrain/left paracentral pons. CTA head negative for major stenosis. Carotid Doppler did not show any major stenosis, 2-D echo showed EF around 35-to 40%.  A1c 11.0, LDL 58. Continue both aspirin and Plavix. Evaluated by therapy services-recommendations are to discharge to CIR. Nonfocal exam, but continues to have very minimal diplopia.   Active Problems: Dyslipidemia: LDL 58, continue Lipitor.  Insulin-dependent diabetes: Had hypoglycemic event on admission, hence Lantus was decreased to 20 units. CBGs remained stable. Continue with current regimen. Note  A1c 11.0  Chronic combined systolic and diastolic heart failure:  EF 35-40%. Clinically compensated. Reviewed last outpatient cardiology note-unfortunately has had hyperkalemia with low doses of Ace and spironolactone. Has had issues with orthostatic hypotension. Continue Coreg, Lasix and digoxin. Not on Ace/ARB due to hyperkalemia and Hypotension. Please ensure follow-up with cardiology on discharge.  History of CAD: Currently without chest pain or shortness of breath. Continue antiplatelets, beta blocker and statin. Follow.  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 Watch closely.  Depression: Appears stable, continue Cymbalta and Celexa  TODAY-DAY OF DISCHARGE:  Subjective:   [name] today has no headache,no chest abdominal pain,no new  weakness tingling or numbness, feels much better wants to go home today.  Objective:   Blood pressure 132/75, pulse 81, temperature 98.6 F (37 C), temperature source Oral, resp. rate 20, height 5\' 4"  (1.626 m), weight 70.5 kg (155 lb 6.8 oz), last menstrual period 03/13/2011, SpO2 99 %. No intake or output data in the 24 hours ending 05/25/15 1025 Filed Weights   05/22/15 1401 05/22/15 1950  Weight: 70.308 kg (155 lb) 70.5 kg (155 lb 6.8 oz)    Exam Awake Alert, Oriented *3, No new F.N deficits, Normal affect [place].AT,PERRAL Supple Neck,No JVD, No cervical lymphadenopathy appriciated.  Symmetrical Chest wall movement, Good air movement bilaterally, CTAB RRR,No Gallops,Rubs  or new Murmurs, No Parasternal Heave +ve B.Sounds, Abd Soft, Non tender, No organomegaly appriciated, No rebound -guarding or rigidity. No Cyanosis, Clubbing or edema, No new Rash or bruise  DISCHARGE CONDITION: Stable  DISPOSITION: CIR  DISCHARGE INSTRUCTIONS:    Activity:  As tolerated   Get Medicines reviewed and adjusted: Please take all your medications with you for your next visit with your Primary MD  Please request your Primary MD to go over all hospital tests and procedure/radiological results at the follow up, please ask your Primary MD to get all Hospital records sent to his/her office.  If you experience worsening of your admission symptoms, develop shortness of breath, life threatening emergency, suicidal or homicidal thoughts you must seek medical attention immediately by calling 911 or calling your MD immediately  if symptoms less severe.  You must read complete instructions/literature along with all the possible adverse reactions/side effects for all the Medicines you take and that have been prescribed to you. Take any new Medicines after you have completely understood and accpet all the possible adverse reactions/side effects.   Do not drive when taking Pain medications.   Do not take more  than prescribed Pain, Sleep and Anxiety Medications  Special Instructions: If you have smoked or chewed Tobacco  in the last 2 yrs please stop smoking, stop any regular Alcohol  and or any Recreational drug use.  Wear Seat belts while driving.  Please note  You were cared for by a hospitalist during your hospital stay. Once you are discharged, your primary care physician will handle any further medical issues. Please note that NO REFILLS for any discharge medications will be authorized once you are discharged, as it is imperative that you return to your primary care physician (or establish a relationship with a primary care physician if you do not have one) for your aftercare needs so that they can reassess your need for medications and monitor your lab values.   Diet recommendation: Diabetic Diet Heart Healthy diet  Discharge Instructions    Ambulatory referral to Neurology    Complete by:  As directed   Follow up with carolyn Daphine Deutscher NP at [facility] in one month. thanks     Diet - low sodium heart healthy    Complete by:  As directed      Increase activity slowly    Complete by:  As directed            Follow-up Information    Follow up with Nilda Riggs, NP. Schedule an appointment as soon as possible for a visit in 1 month.   Specialty:  Family Medicine   Why:  stroke clinic   Contact information:   [address] [place] [address] [phone]       Follow up with Julian Hy, MD. Schedule an appointment as soon as possible for a visit in 2 weeks.   Specialty:  Endocrinology   Contact information:   [address] [place] [address] [phone]       Follow up with Marca Ancona, MD. Schedule an appointment as soon as possible for a visit in 2 weeks.   Specialty:  Cardiology   Contact information:   [address] [place] [place] [address] [phone]      Total Time spent on discharge equals  45  minutes.  SignedJeoffrey Massed 05/25/2015 10:25 AM

## 2015-05-25 NOTE — Progress Notes (Signed)
Patient ID: [name], female   DOB: 07-19-1960, 55 y.o.   MRN: [id number] Patient arrived via wheelchair to [phone] with belongings. Oriented to room, rehab process, fall prevention plan, rehab safety plan, rehab schedule, heath resource notebook, and nurse call system. Patient resting in bed with no c/o pain. Continue with plan of care.

## 2015-05-25 NOTE — Progress Notes (Signed)
[name] Rehab Admission Coordinator Signed Physical Medicine and Rehabilitation PMR Pre-admission 05/25/2015 3:41 PM  Related encounter: ED to Hosp-Admission (Discharged) from 05/22/2015 in [facility]    Expand All Collapse All   PMR Admission Coordinator Pre-Admission Assessment  Patient: [name] is an 55 y.o., female MRN: [id number] DOB: 1960/05/30 Height: 5\' 4"  (162.6 cm) Weight: 70.5 kg (155 lb 6.8 oz)  Insurance Information HMO: yes PPO: PCP: IPA: 80/20: OTHER:  PRIMARY: [employer] Medicare Policy#: [id number] Subscriber: self CM Name: Gweneth Dimitri with Tresa Endo Crim([phone]) to follow up with EMR access Phone#: [phone] Fax#: [phone] Pre-Cert#: [id number] Employer: disabled Benefits: Phone #: [phone] Name: Tobie Lords. Date: 04/24/15 Deduct: $0 Out of Pocket Max: $4900 Life Max: none CIR: $345 days 1-6, $0 days 6 and beyond SNF: $0 days 1-20; $160 per day, 21-51; $0 days 52-100 Outpatient: $40 copay Co-Pay:  Home Health: 100% Co-Pay:  DME: 80%/20% Co-Pay:  Providers: [place] SECONDARY: Policy#: Subscriber:  CM Name: Phone#: Fax#:  Pre-Cert#: Employer:  Benefits: Phone #: Name:  Eff. Date: Deduct: Out of Pocket Max: Life Max:  CIR: SNF:  Outpatient: Co-Pay:  Home Health: Co-Pay:  DME: Co-Pay:   Medicaid Application Date: Case Manager:  Disability Application Date: Case Worker:   Emergency Contact Information Contact Information    Name Relation Home Work Mobile   Reece Levy Mother [phone]       Current Medical History  Patient Admitting Diagnosis:  infarct left paracentral midbrain and posterior left paracentral pons History of Present Illness: [name] is a 55 y.o. 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 Patient lives with parent. One level home with ramped entrance. Mother is 27 years old and limited assistance. Patient uses a rolling walker prior to admission. Presented 05/22/2015 with double vision decrease in balance and recent fall while using a walker. MRI of the brain showed acute small nonhemorrhagic infarct left paracentral midbrain and posterior left paracentral pons. Remote small infarcts scattered throughout the centrum semiovale ovale, remote thalamic infarct. Chronic small vessel disease changes. CT angiogram head showed no major intracranial arterial occlusion. Echocardiogram with ejection fraction of 40%. Systolic function moderately reduced. No cardiac source of emboli identified.. Patient did not receive TPA. Neurology services consulted maintain on aspirin and Plavix for CVA prophylaxis. Subcutaneous Lovenox for DVT prophylaxis. Maintain on regular diet consistency. Physical and occupational therapy evaluations completed with recommendations of physical medicine rehabilitation consult. Total: 2 NIH    Past Medical History  Past Medical History  Diagnosis Date  . Hypertension   . Vitamin D deficiency   . Coronary artery disease     a. (12/11) NSTEMI, in the setting of DKA, s/p Cath--LAD totalled in the midportion, diag 70% sten (small 2-mm ), LCx nonobst, OM1 40%, OM2 30%, RCA- no dz. chronic calcified LAD --> no intervention. b. (06/2010) NSTEMI s/p cath showing same findings   . Ischemic cardiomyopathy   . Orthostatic hypotension     a. sycopal episode in 12/11   . Diabetes mellitus     a. Insulin dependant  Type I   . Hyperlipidemia   . Diabetic nephropathy (HCC)   . Diabetic retinopathy   . Gastroparesis   . Depression   . Systolic heart failure     a. (08/2012) EF: 40% Mod hypokin in mid-dist-ant-sept myocard--> psuedonml L  vent fill pattern, Grd 2 diast dyscfxn  . Iron deficiency anemia   . Hx MRSA infection   . Osteopenia   . Esophagitis   . NSTEMI (non-ST elevated myocardial infarction) (HCC)     a. (03/2010) NSTEMI during admission for DKA b.  . Stroke [facility])     a. (2012) residual R hand/arm weakness b. while on ASA and plavix  . Diabetic neuropathy (HCC)     Family History  family history includes Diabetes in her maternal grandfather; Pancreatic cancer in her father. There is no history of Colon cancer.  Prior Rehab/Hospitalizations:  Has the patient had major surgery during 100 days prior to admission? No  Current Medications   Current facility-administered medications:  . stroke: mapping our early stages of recovery book, , Does not apply, Once, Calvert Cantor, MD . aspirin EC tablet 81 mg, 81 mg, Oral, QHS, Calvert Cantor, MD, 81 mg at 05/24/15 2130 . carvedilol (COREG) tablet 6.25 mg, 6.25 mg, Oral, BID, Maretta Bees, MD, 6.25 mg at 05/25/15 1133 . citalopram (CELEXA) tablet 20 mg, 20 mg, Oral, Daily, Calvert Cantor, MD, 20 mg at 05/25/15 1133 . clopidogrel (PLAVIX) tablet 75 mg, 75 mg, Oral, Daily, Calvert Cantor, MD, 75 mg at 05/25/15 1131 . digoxin (LANOXIN) tablet 0.0625 mg, 0.0625 mg, Oral, QODAY, Calvert Cantor, MD, 0.0625 mg at 05/25/15 1132 . DULoxetine (CYMBALTA) DR capsule 30 mg, 30 mg, Oral, Daily, Calvert Cantor, MD, 30 mg at 05/25/15 1132 . enoxaparin (LOVENOX) injection 40 mg, 40 mg, Subcutaneous, Q24H, Calvert Cantor, MD, 40 mg at 05/24/15 1947 . ferrous sulfate tablet 650 mg, 650 mg, Oral, Q supper, Calvert Cantor, MD, 650 mg at 05/24/15 1825 . furosemide (LASIX) tablet 20 mg, 20 mg, Oral, Daily, Calvert Cantor, MD, 20 mg at 05/25/15 1132 . insulin aspart (novoLOG) injection 0-15 Units, 0-15 Units, Subcutaneous, TID WC, Calvert Cantor, MD, 3 Units at 05/25/15 1135 . insulin aspart (novoLOG) injection 4 Units, 4 Units, Subcutaneous, TID WC, Maretta Bees, MD, 4 Units at 05/25/15 1200 . insulin detemir (LEVEMIR) injection 12 Units, 12 Units, Subcutaneous, Daily, Maretta Bees, MD, 12 Units at 05/25/15 1000 . rosuvastatin (CRESTOR) tablet 40 mg, 40 mg, Oral, Q supper, Calvert Cantor, MD, 40 mg at 05/24/15 1825  Patients Current Diet: Diet heart healthy/carb modified Room service appropriate?: Yes; Fluid consistency:: Thin Diet - low sodium heart healthy  Precautions / Restrictions Precautions Precautions: Fall Restrictions Weight Bearing Restrictions: No   Has the patient had 2 or more falls or a fall with injury in the past year?Yes One fall with bruising of leg  Prior Activity Level Limited Community (1-2x/wk): Pt's mom reports pt. goes out of house 2-3 times per week. Pt. goes to church with her sister, goes out to eat once a week with a friend and will ride with her mom to get groceries or to the pharmacy  Home Assistive Devices / Equipment Home Assistive Devices/Equipment: Medical laboratory scientific officer (specify quad or straight), Environmental consultant (specify type) Home Equipment: Environmental consultant - 2 wheels, [employer] - quad, Information systems manager  Prior Device Use: Indicate devices/aids used by the patient prior to current illness, exacerbation or injury? small based quaad cane and RW  Prior Functional Level Prior Function Level of Independence: Independent with assistive device(s) Comments: Uses cane indoors, RW when out of home  Self Care: Did the patient need help bathing, dressing, using the toilet or eating? Needed some help with dressing upper body due to frozen shoulders  Indoor Mobility: Did the patient  need assistance with walking from room to room (with or without device)? Independent  Stairs: Did the patient need  assistance with internal or external stairs (with or without device)? N/a, used ramp PTA  Functional Cognition: Did the patient need help planning regular tasks such as shopping or remembering to take medications? Dependent; mom managed pt's pill box  Current Functional Level Cognition  Arousal/Alertness: Awake/alert Overall Cognitive Status: History of cognitive impairments - at baseline Orientation Level: Oriented X4 Following Commands: (follows commands inconsistently) Safety/Judgement: Decreased awareness of safety General Comments: slow processing noted, may be her baseline Attention: Selective Selective Attention: Appears intact Memory: Impaired Memory Impairment: Retrieval deficit (mild deficits that pt describes as baseline) Awareness: Appears intact Problem Solving: Appears intact   Extremity Assessment (includes Sensation/Coordination)  Upper Extremity Assessment: RUE deficits/detail, LUE deficits/detail RUE Deficits / Details: shoulder flexion - 90 degrees. Pt reports , "frozen shoulder" LUE Deficits / Details: shoulder flexion - 90 degrees. Pt reports , "frozen shoulder"  Lower Extremity Assessment: Defer to PT evaluation RLE Deficits / Details: knee ext 3+/5, hip flex 3-/5 RLE Coordination: decreased fine motor, decreased gross motor    ADLs  Overall ADL's : Needs assistance/impaired Eating/Feeding: Set up Eating/Feeding Details (indicate cue type and reason): opening containers and cutting food Grooming: Wash/dry face, Sitting, Applying deodorant, Min guard, Standing, Oral care (Set up/Supervision-Min guard assist) Grooming Details (indicate cue type and reason): Pt requiring min cues for initiation and sequencing of tasks. Pt needed min A for standing balance at sink side as she exhibits R lean in standing.  Upper Body Bathing: Set up, Supervision/ safety, Sitting Upper Body Dressing : Minimal assistance, Sitting Lower Body Dressing: Set up,  Sitting/lateral leans Lower Body Dressing Details (indicate cue type and reason): donned/doffed socks Toilet Transfer: Minimal assistance, Ambulation, RW (Min guard-Min assist for sit to stand from chair/bed; Min **) Functional mobility during ADLs: Min guard, Rolling walker General ADL Comments: pt leaning to right at sink while standing-cues given to try to correct posture.    Mobility  Overal bed mobility: Needs Assistance Bed Mobility: Supine to Sit Supine to sit: Supervision General bed mobility comments: seated in recliner chair upon entering the room    Transfers  Overall transfer level: Needs assistance Equipment used: Rolling walker (2 wheeled), None Transfers: Sit to/from Stand Sit to Stand: Mod assist General transfer comment: pt with inadequate forward lean/weight shift and requires incr assist to prevent falling back down into sitting; repeated for exercise x 8    Ambulation / Gait / Stairs / Wheelchair Mobility  Ambulation/Gait Ambulation/Gait assistance: Min assist, Mod assist Ambulation Distance (Feet): 50 Feet (standing rest, 35) Assistive device: Rolling walker (2 wheeled), 1 person hand held assist Gait Pattern/deviations: Step-through pattern, Decreased stride length, Decreased weight shift to left, Drifts right/left, Wide base of support General Gait Details: Using eye patch, began with RW and pt with running into objects on her right; tends to walk behind walker; pt reports she normally uses a cane and tried HHA on her right; pt with incr Rt lean required up to mod assist to recover  Gait velocity: decreased Gait velocity interpretation: <1.8 ft/sec, indicative of risk for recurrent falls    Posture / Balance Balance Overall balance assessment: Needs assistance Sitting-balance support: Single extremity supported Sitting balance-Leahy Scale: Fair Postural control: Right lateral lean Standing balance support: No upper extremity supported Standing  balance-Leahy Scale: Poor Standing balance comment: posterior and Rt lean; worked on standing balance and wt-shift to midline after  each sit to stand with pt remaining leaning to her Rt; when assisted to midline, pt feels she is falling to her left    Special needs/care consideration BiPAP/CPAP no CPM no Continuous Drip IV no  Dialysis no  Life Vest no Oxygen no Special Bed no Trach Size no Wound Vac (area) no Location no Skin WDL  Bowel mgmt: Last BM 05/22/15, continent with BSC and bathroom with assist Bladder mgmt Continent, BSC with assist Diabetic mgmt A1c 11.0     Previous Home Environment Living Arrangements: Parent Available Help at Discharge: Family, Available 24 hours/day Type of Home: House Home Layout: One level Home Access: Ramped entrance Bathroom Shower/Tub: Tub/shower unit, Engineer, building services: Handicapped height Bathroom Accessibility: Yes How Accessible: Accessible via walker Home Care Services: No Additional Comments: pt's mother is 57 yo, is independent and does the driving, though she recently had a CABG. Pt reports her mother drives and does all the cooking while she helps with cleaning of home.  Discharge Living Setting Plans for Discharge Living Setting: Patient's home Type of Home at Discharge: House Discharge Home Layout: One level Discharge Home Access: Ramped entrance Discharge Bathroom Shower/Tub: Tub/shower unit Discharge Bathroom Toilet: Handicapped height Discharge Bathroom Accessibility: Yes How Accessible: Accessible via walker Does the patient have any problems obtaining your medications?: No  Social/Family/Support Systems Patient Roles: Parent (pt. has one adult son) Anticipated Caregiver: pt's mother Fidel Levy Anticipated Caregiver's Contact Information: [phone], house phone only as no cell service at pt's home Ability/Limitations of Caregiver: pt's mom is  able to provide 24 hour supervision but no physical assist due to recent CABG Caregiver Availability: 24/7 Discharge Plan Discussed with Primary Caregiver: Yes Is Caregiver In Agreement with Plan?: Yes Does Caregiver/Family have Issues with Lodging/Transportation while Pt is in Rehab?: No   Goals/Additional Needs Patient/Family Goal for Rehab: mod I and supervision with PT; mod I with OT; n/a SLP Expected length of stay: 7-10 days Cultural Considerations: "[employer]"  Dietary Needs: heart healthy, carb modified, thin liquids Equipment Needs: TBD Special Service Needs: pt. has premorbid short term memory issues per pt's mom Pt/Family Agrees to Admission and willing to participate: Yes Program Orientation Provided & Reviewed with Pt/Caregiver Including Roles & Responsibilities: Yes   Decrease burden of Care through IP rehab admission: no   Possible need for SNF placement upon discharge: not expected   Patient Condition: This patient's condition remains as documented in the consult dated 05/23/15 , in which the Rehabilitation Physician determined and documented that the patient's condition is appropriate for intensive rehabilitative care in an inpatient rehabilitation facility. Will admit to inpatient rehab today.  Preadmission Screen Completed By: [name], 05/25/2015 4:15 PM ______________________________________________________________________  Discussed status with Dr. Allena Katz on 05/25/15 at 1557 at and received telephone approval for admission today.  Admission Coordinator: [name], time 8295 /Date 05/25/15          Cosigned by: Ankit Karis Juba, MD at 05/25/2015 4:17 PM  Revision History     Date/Time User Provider Type Action   05/25/2015 4:17 PM Ankit Karis Juba, MD Physician Cosign   05/25/2015 4:15 PM [name] Rehab Admission Coordinator Sign

## 2015-05-25 NOTE — Progress Notes (Signed)
Ankit Karis Juba, MD Physician Signed Physical Medicine and Rehabilitation Consult Note 05/23/2015 3:25 PM  Related encounter: ED to Hosp-Admission (Discharged) from 05/22/2015 in [facility] 5 [facility]TR[facility] NEURO SURGICAL    Expand All Collapse All        Physical Medicine and Rehabilitation Consult Reason for Consult: Nonhemorrhagic infarct left paracentral midbrain and posterior left paracentral pons. Referring Physician: [facility]    HPI: Kalilah RAIMA GEATHERS is a 55 y.o. right handed female with history of hypertension, chronic systolic heart failure, CAD with ischemic cardiomyopathy, hyperlipidemia, history of cva, diabetes mellitus and peripheral neuropathy. Patient lives with parent. One level home with ramped entrance. Mother is 61 years old and limited assistance. Patient uses a rolling walker prior to admission. Presented 05/22/2015 with double vision decrease in balance and recent fall while using a walker. MRI of the brain showed acute small nonhemorrhagic infarct left paracentral midbrain and posterior left paracentral pons. Remote small infarcts scattered throughout the centrum semiovale ovale, remote thalamic infarct. Chronic small vessel disease changes. CT angiogram head showed no major intracranial arterial occlusion. Echocardiogram pending. Patient did not receive TPA. Neurology services consulted maintain on aspirin and Plavix for CVA prophylaxis. Subcutaneous Lovenox for DVT prophylaxis. Maintain on regular diet consistency.   Review of Systems  Constitutional: Negative for fever and chills.  HENT: Negative for hearing loss.  Eyes: Positive for double vision. Negative for blurred vision.  Respiratory: Negative for cough and shortness of breath.  Cardiovascular: Negative for chest pain and palpitations.  Gastrointestinal:   GERD  Genitourinary: Negative for dysuria and hematuria.  Musculoskeletal: Positive for myalgias, joint pain and falls.  Skin: Negative for rash.   Neurological: Positive for dizziness. Negative for seizures, loss of consciousness, weakness and headaches.  Psychiatric/Behavioral: Positive for depression.   Anxiety  All other systems reviewed and are negative.  Past Medical History  Diagnosis Date  . Hypertension   . Vitamin D deficiency   . Coronary artery disease     a. (12/11) NSTEMI, in the setting of DKA, s/p Cath--LAD totalled in the midportion, diag 70% sten (small 2-mm ), LCx nonobst, OM1 40%, OM2 30%, RCA- no dz. chronic calcified LAD --> no intervention. b. (06/2010) NSTEMI s/p cath showing same findings   . Ischemic cardiomyopathy   . Orthostatic hypotension     a. sycopal episode in 12/11   . Diabetes mellitus     a. Insulin dependant Type I   . Hyperlipidemia   . Diabetic nephropathy (HCC)   . Diabetic retinopathy   . Gastroparesis   . Depression   . Systolic heart failure     a. (08/2012) EF: 40% Mod hypokin in mid-dist-ant-sept myocard--> psuedonml L vent fill pattern, Grd 2 diast dyscfxn  . Iron deficiency anemia   . Hx MRSA infection   . Osteopenia   . Esophagitis   . NSTEMI (non-ST elevated myocardial infarction) (HCC)     a. (03/2010) NSTEMI during admission for DKA b.  . Stroke [facility])     a. (2012) residual R hand/arm weakness b. while on ASA and plavix  . Diabetic neuropathy [facility])    Past Surgical History  Procedure Laterality Date  . Appendectomy  1980  . Tubal ligation  1987  . Cesarean section  1984  . Refractive surgery      x3   Family History  Problem Relation Age of Onset  . Pancreatic cancer Father   . Colon cancer Neg Hx   . Diabetes Maternal Grandfather  Social History:  reports that she has never smoked. She has never used smokeless tobacco. She reports that she does not drink alcohol or use illicit drugs. Allergies:  Allergies  Allergen Reactions  .  Erythromycin Other (See Comments)    GI upset  . Food Nausea Only    Green peas  . Metoclopramide Hcl Other (See Comments)    fatique, depression   Medications Prior to Admission  Medication Sig Dispense Refill  . aspirin EC 81 MG tablet Take 81 mg by mouth at bedtime.     . Calcium Carb-Cholecalciferol (CALCIUM 500 +D PO) Take 500 mg by mouth daily with lunch.    . carvedilol (COREG) 6.25 MG tablet Take 6.25 mg by mouth 2 (two) times daily.    . citalopram (CELEXA) 20 MG tablet Take 20 mg by mouth daily.     . clopidogrel (PLAVIX) 75 MG tablet Take 1 tablet (75 mg total) by mouth daily. 30 tablet 6  . digoxin (LANOXIN) 0.125 MG tablet Take 0.0625 mg by mouth every other day.    . DULoxetine (CYMBALTA) 30 MG capsule Take 30 mg by mouth daily.     . ferrous sulfate 325 (65 FE) MG tablet Take 650 mg by mouth daily with supper.     . furosemide (LASIX) 20 MG tablet TAKE 1 TABLET (20 MG TOTAL) BY MOUTH DAILY. 30 tablet 6  . Insulin Degludec (TRESIBA FLEXTOUCH) 200 UNIT/ML SOPN Inject 20 Units into the skin daily after breakfast.    . insulin lispro (HUMALOG KWIKPEN) 100 UNIT/ML KiwkPen Inject 1-7 Units into the skin 3 (three) times daily as needed (CBG >150). Per sliding scale: CBG 151-200 1 unit, 201-250 2 units, 251-300 3 units, 301-350 5 units, >350 7 units    . LORazepam (ATIVAN) 0.5 MG tablet Take 0.5 mg by mouth See admin instructions. Take 1 tablet (0.5 mg) by mouth daily at bedtime, may also take 1 tablet (0.5 mg) in the morning as needed for anxiety    . Multiple Vitamin (MULTIVITAMIN WITH MINERALS) TABS tablet Take 1 tablet by mouth daily with lunch.    . rosuvastatin (CRESTOR) 40 MG tablet Take 40 mg by mouth daily with supper.  30 tablet 11  . Vitamin D, Ergocalciferol, (DRISDOL) 50000 UNITS CAPS Take 50,000 Units by mouth every 7 (seven) days. Saturday      Home: Home  Living Family/patient expects to be discharged to:: Private residence Living Arrangements: Parent Available Help at Discharge: Family, Available 24 hours/day Type of Home: House Home Access: Ramped entrance Home Layout: One level Bathroom Shower/Tub: Tub/shower unit, Engineer, building services: Standard Bathroom Accessibility: Yes Home Equipment: Environmental consultant - 2 wheels, Cane - quad, Shower seat Additional Comments: pt's mother is 66 yo, is independent and does the driving, though she recently had a CABG. Pt reports her mother drives and does all the cooking while she helps with cleaning of home.  Functional History: Prior Function Level of Independence: Independent with assistive device(s) Comments: Pt reports ambulation with quad cane or RW at baseline, shower seat for bathing, R sided weakness Functional Status:  Mobility: Bed Mobility Overal bed mobility: Modified Independent General bed mobility comments: seated in recliner chair upon entering the room Transfers Overall transfer level: Needs assistance Equipment used: Rolling walker (2 wheeled) Transfers: Sit to/from Stand Sit to Stand: Min assist General transfer comment: Min A to stand from recliner chair with min verbal cues for hand placement and technique Ambulation/Gait Ambulation/Gait assistance: Min assist Ambulation Distance (Feet): 100  Feet Assistive device: Rolling walker (2 wheeled) Gait Pattern/deviations: Decreased step length - right General Gait Details: Gauze taped over left eye for session which corrected diplopia. pt drifts right, vc's needed to correct. Multiple posterior LOB with min A to correct, noted right knee weakness Gait velocity: decreased Gait velocity interpretation: <1.8 ft/sec, indicative of risk for recurrent falls    ADL: ADL Overall ADL's : Needs assistance/impaired Eating/Feeding: Set up Eating/Feeding Details (indicate cue type and reason): opening containers and cutting food Grooming:  Oral care, Supervision/safety, Standing Grooming Details (indicate cue type and reason): Pt requiring min cues for initiation and sequencing of tasks. Pt needed min A for standing balance at sink side as she exhibits R lean in standing.  Lower Body Dressing: Min guard, Sitting/lateral leans, Sit to/from stand Lower Body Dressing Details (indicate cue type and reason): socks only this session. Pt seated and able to don/doff B socks with increased time and steady assist for balance.  General ADL Comments: Pt with decreased safety awareness this session. L eye occluded for double vision concerns with ambulation. No c/o nausea with movement. Pt standing at sink for grooming tasks with R lateral lean and min A needed for standing balance with functional task.  Cognition: Cognition Overall Cognitive Status: Impaired/Different from baseline Orientation Level: Oriented X4 Cognition Arousal/Alertness: Awake/alert Behavior During Therapy: WFL for tasks assessed/performed Overall Cognitive Status: Impaired/Different from baseline Area of Impairment: Problem solving, Safety/judgement Safety/Judgement: Decreased awareness of safety Problem Solving: Slow processing, Requires verbal cues, Difficulty sequencing, Decreased initiation General Comments: unsure if processing is baseline  Blood pressure 142/62, pulse 73, temperature 98.4 F (36.9 C), temperature source Oral, resp. rate 15, height  (1.626 m), weight 70.5 kg (155 lb 6.8 oz), last menstrual period 03/13/2011, SpO2 99 %. Physical Exam  Vitals reviewed. Constitutional: She is oriented to person, place, and time. She appears well-developed and well-nourished.  HENT:  Head: Normocephalic and atraumatic.  Eyes: Conjunctivae and EOM are normal.  Pupils round and reactive to light  Neck: Normal range of motion. Neck supple. No thyromegaly present.  Cardiovascular: Normal rate and regular rhythm.  Respiratory: Effort normal and breath sounds  normal. No respiratory distress.  GI: Soft. Bowel sounds are normal. She exhibits no distension.  Musculoskeletal: She exhibits no edema or tenderness.  Neurological: She is alert and oriented to person, place, and time.  Makes good eye contact with examiner.  Follows simple commands.  Fair awareness of deficits. Motor:LUE/LLE 5/5 proximal to distal RUE/RLE: 4+/5 proximal to distal Sensation intact to light touch 3+ DTRs RUE/RLE  Skin: Skin is warm and dry.  Psychiatric: She has a normal mood and affect. Her behavior is normal.     Lab Results Last 24 Hours    Results for orders placed or performed during the hospital encounter of 05/22/15 (from the past 24 hour(s))  CBG monitoring, ED Status: None   Collection Time: 05/22/15 4:25 PM  Result Value Ref Range   Glucose-Capillary 66 65 - 99 mg/dL  CBG monitoring, ED Status: Abnormal   Collection Time: 05/22/15 5:42 PM  Result Value Ref Range   Glucose-Capillary 130 (H) 65 - 99 mg/dL  Glucose, capillary Status: Abnormal   Collection Time: 05/22/15 8:03 PM  Result Value Ref Range   Glucose-Capillary 129 (H) 65 - 99 mg/dL   Comment 1 Notify RN    Comment 2 Document in Chart   Lipid panel Status: None   Collection Time: 05/23/15 5:00 AM  Result Value Ref Range  Cholesterol 126 0 - 200 mg/dL   Triglycerides 409 <811 mg/dL   HDL 45 >91 mg/dL   Total CHOL/HDL Ratio 2.8 RATIO   VLDL 23 0 - 40 mg/dL   LDL Cholesterol 58 0 - 99 mg/dL  Glucose, capillary Status: Abnormal   Collection Time: 05/23/15 5:22 AM  Result Value Ref Range   Glucose-Capillary 26 (LL) 65 - 99 mg/dL  Glucose, capillary Status: Abnormal   Collection Time: 05/23/15 5:45 AM  Result Value Ref Range   Glucose-Capillary 110 (H) 65 - 99 mg/dL  CBC Status: Abnormal   Collection Time: 05/23/15 8:15 AM  Result Value Ref Range   WBC  7.7 4.0 - 10.5 K/uL   RBC 4.96 3.87 - 5.11 MIL/uL   Hemoglobin 15.4 (H) 12.0 - 15.0 g/dL   HCT 47.8 29.5 - 62.1 %   MCV 91.1 78.0 - 100.0 fL   MCH 31.0 26.0 - 34.0 pg   MCHC 34.1 30.0 - 36.0 g/dL   RDW 30.8 65.7 - 84.6 %   Platelets 236 150 - 400 K/uL  Comprehensive metabolic panel Status: Abnormal   Collection Time: 05/23/15 8:15 AM  Result Value Ref Range   Sodium 143 135 - 145 mmol/L   Potassium 4.2 3.5 - 5.1 mmol/L   Chloride 107 101 - 111 mmol/L   CO2 24 22 - 32 mmol/L   Glucose, Bld 204 (H) 65 - 99 mg/dL   BUN 18 6 - 20 mg/dL   Creatinine, Ser 9.62 (H) 0.44 - 1.00 mg/dL   Calcium 9.7 8.9 - 95.2 mg/dL   Total Protein 6.8 6.5 - 8.1 g/dL   Albumin 3.5 3.5 - 5.0 g/dL   AST 22 15 - 41 U/L   ALT 18 14 - 54 U/L   Alkaline Phosphatase 56 38 - 126 U/L   Total Bilirubin 0.7 0.3 - 1.2 mg/dL   GFR calc non Af Amer 54 (L) >60 mL/min   GFR calc Af Amer >60 >60 mL/min   Anion gap 12 5 - 15  Lactic acid, plasma Status: None   Collection Time: 05/23/15 8:15 AM  Result Value Ref Range   Lactic Acid, Venous 1.9 0.5 - 2.0 mmol/L  Lactic acid, plasma Status: None   Collection Time: 05/23/15 11:50 AM  Result Value Ref Range   Lactic Acid, Venous 1.4 0.5 - 2.0 mmol/L  Glucose, capillary Status: Abnormal   Collection Time: 05/23/15 12:21 PM  Result Value Ref Range   Glucose-Capillary 192 (H) 65 - 99 mg/dL   Comment 1 Notify RN    Comment 2 Document in Chart       Imaging Results (Last 48 hours)    Ct Angio Head W/cm &/or Wo Cm  05/23/2015 CLINICAL DATA: Diplopia. Generalized weakness and blurred vision. Small acute infarcts in the midbrain and pons on brain MRI. EXAM: CT ANGIOGRAPHY HEAD TECHNIQUE: Multidetector CT imaging of the head was performed using the standard protocol during bolus administration of intravenous  contrast. Multiplanar CT image reconstructions and MIPs were obtained to evaluate the vascular anatomy. CONTRAST: 50mL OMNIPAQUE IOHEXOL 350 MG/ML SOLN COMPARISON: Brain MRI earlier today FINDINGS: CTA HEAD Anterior circulation: Internal carotid arteries are patent from skullbase to carotid termini with mild atherosclerotic plaque bilaterally. There is mild right proximal supraclinoid ICA stenosis. ACAs and MCAs are patent without evidence of major branch occlusion or significant stenosis. No intracranial aneurysm is identified. Posterior circulation: The visualized distal vertebral arteries are patent with the left being dominant. There is mild  atherosclerotic irregularity of the left V4 segment without stenosis. AICA and SCA origins are patent. Basilar artery is patent without stenosis. There is a medium-sized left posterior communicating artery with slight hypoplasia of the left P1 segment. There is also a tiny right posterior communicating artery. PCAs are patent with mild branch vessel irregularity but no significant proximal stenosis. Venous sinuses: Patent. Anatomic variants: None. Delayed phase: No abnormal enhancement. Small acute brainstem infarcts on MRI are not well seen by CT. Chronic small vessel ischemic changes are noted in the cerebral white matter bilaterally. Mildly advanced cerebral atrophy for age. IMPRESSION: 1. No major intracranial arterial occlusion. 2. Mild intracranial atherosclerosis with mild right ICA stenosis. Electronically Signed By: Sebastian Ache M.D. On: 05/23/2015 10:23   Dg Chest 2 View  05/23/2015 CLINICAL DATA: Weakness . Fever. EXAM: CHEST 2 VIEW COMPARISON: 09/15/2014 . FINDINGS: Mediastinum and hilar structures normal. Lungs are clear. Heart size normal. No pleural effusion or pneumothorax. Old right seventh rib fracture. Chest is stable from prior exam. IMPRESSION: No acute cardiopulmonary disease. Electronically Signed By: Maisie Fus Register On: 05/23/2015  07:39   Ct Head Wo Contrast  05/22/2015 CLINICAL DATA: Double vision at of left eye since yesterday. EXAM: CT HEAD WITHOUT CONTRAST TECHNIQUE: Contiguous axial images were obtained from the base of the skull through the vertex without intravenous contrast. COMPARISON: 05/26/2013. FINDINGS: There is no evidence for acute hemorrhage, hydrocephalus, mass lesion, or abnormal extra-axial fluid collection. No definite CT evidence for acute infarction. Patchy low attenuation in the deep hemispheric and periventricular white matter is nonspecific, but likely reflects chronic microvascular ischemic demyelination. Diffuse loss of parenchymal volume is consistent with atrophy. The visualized paranasal sinuses and mastoid air cells are clear. IMPRESSION: Stable exam. Mild atrophy for age with chronic small vessel white matter ischemic demyelination. No acute intracranial abnormality. Electronically Signed By: Kennith Center M.D. On: 05/22/2015 15:25   Mr Laqueta Jean ZO Contrast  05/23/2015 CLINICAL DATA: 55 year old hypertensive diabetic female with anemia and hyperlipidemia presenting with double vision. Recent fall. Subsequent encounter. EXAM: MRI HEAD WITHOUT AND WITH CONTRAST TECHNIQUE: Multiplanar, multiecho pulse sequences of the brain and surrounding structures were obtained without and with intravenous contrast. CONTRAST: 16mL MULTIHANCE GADOBENATE DIMEGLUMINE 529 MG/ML IV SOLN COMPARISON: 05/22/2015 CT. 08/28/2010 MR. FINDINGS: Sequences are motion degraded. Patient was not able to complete post-contrast coronal imaging. Acute small nonhemorrhagic infarcts left paracentral midbrain (extending to the inferior medial aspect left peri third ventricular region) and posterior left paracentral pons. Remote small infarcts scattered throughout the centrum semi ovale/coronal radiata bilaterally. Remote thalamic infarcts. Chronic small vessel disease changes. Global atrophy without hydrocephalus. Mega cisterna magna  incidentally noted. No intracranial mass or abnormal enhancement noted on motion degraded exam. Major intracranial vascular structures are patent. Partial opacification right mastoid air cells without obstructing lesion of eustachian tube noted. Minimal mucosal thickening ethmoid sinus air cells and inferior maxillary sinuses. Minimal exophthalmos. Appearance of prior lens replacement. Slightly heterogeneous bone marrow may be related to patient's habitus or underlying anemia IMPRESSION: Sequences are motion degraded. Patient was not able to complete post-contrast coronal imaging. Acute small nonhemorrhagic infarcts left paracentral midbrain and posterior left paracentral pons. Remote small infarcts scattered throughout the centrum semiovale/coronal radiata bilaterally. Remote thalamic infarcts. Chronic small vessel disease changes. Global atrophy without hydrocephalus. No intracranial mass or abnormal enhancement noted on motion degraded exam. Partial opacification right mastoid air cells without obstructing lesion of eustachian tube noted. Minimal mucosal thickening ethmoid sinus air cells and inferior maxillary sinuses. Electronically Signed By:  Lacy Duverney M.D. On: 05/23/2015 10:02     Assessment/Plan: Diagnosis: infarct left paracentral midbrain and posterior left paracentral pons Labs and images independently reviewed. Records reviewed and summated above. Stroke: Continue secondary stroke prophylaxis and Risk Factor Modification listed below:  Antiplatelet therapy:  Blood Pressure Management: Continue current medication with prn's with permisive HTN per primary team Statin Agent:  Diabetes management:  Right sided hemiparesis: fit for orthotics to prevent contractures (wrist cock up splint at night) Motor recovery: Fluoxetine  1. Does the need for close, 24 hr/day medical supervision in concert with the patient's rehab needs make it unreasonable for this patient to be served  in a less intensive setting? Potentially 2. Co-Morbidities requiring supervision/potential complications: history of cva (with residual deficits), HTN (monitor and provide prns in accordance with increased physical exertion and pain), chronic systolic heart failure (Monitor in accordance with increased physical activity and avoid UE resistance excercises), CAD with ischemic cardiomyopathy (see CHF), hyperlipidemia (cont meds), diabetes mellitus and peripheral neuropathy (Monitor in accordance with exercise and adjust meds as necessary) 3. Due to safety, disease management and patient education, does the patient require 24 hr/day rehab nursing? Potentially 4. Does the patient require coordinated care of a physician, rehab nurse, PT (1-2 hrs/day, 5 days/week), OT (1-2 hrs/day, 5 days/week) and SLP (1-2 hrs/day, 5 days/week) to address physical and functional deficits in the context of the above medical diagnosis(es)? Potentially Addressing deficits in the following areas: balance, endurance, locomotion, strength, transferring, toileting and psychosocial support 5. Can the patient actively participate in an intensive therapy program of at least 3 hrs of therapy per day at least 5 days per week? Yes 6. The potential for patient to make measurable gains while on inpatient rehab is excellent 7. Anticipated functional outcomes upon discharge from inpatient rehab are modified independent and supervision with PT, modified independent with OT, n/a with SLP. 8. Estimated rehab length of stay to reach the above functional goals is: 7-10 days. 9. Does the patient have adequate social supports and living environment to accommodate these discharge functional goals? Yes 10. Anticipated D/C setting: Home 11. Anticipated post D/C treatments: HH therapy and Home excercise program 12. Overall Rehab/Functional Prognosis: good  RECOMMENDATIONS: This patient's condition is appropriate for continued rehabilitative care in  the following setting: CIR once medical workup complete (Echo pending)   Patient has agreed to participate in recommended program. Yes Note that insurance prior authorization may be required for reimbursement for recommended care.  Comment: Rehab Admissions Coordinator to follow up.  Maryla Morrow, MD 05/23/2015       Revision History     Date/Time User Provider Type Action   05/23/2015 8:58 PM Ankit Karis Juba, MD Physician Sign   05/23/2015 3:31 PM Charlton Amor, PA-C Physician Assistant Pend   View Details Report       Routing History

## 2015-05-25 NOTE — PMR Pre-admission (Signed)
PMR Admission Coordinator Pre-Admission Assessment  Patient: [name] is an 55 y.o., female MRN: [id number] DOB: November 28, 1960 Height:  (162.6 cm) Weight: 70.5 kg (155 lb 6.8 oz)              Insurance Information HMO: yes    PPO:      PCP:      IPA:      80/20:      OTHER:  PRIMARY: [employer] Medicare      Policy#: [id number]      Subscriber:  self CM Name:  Gweneth Dimitri with [phone]) to follow up with EMR access      Phone#: [phone]    Fax#: [phone] Pre-Cert#:  [id number]      Employer:  disabled Benefits:  Phone #:  [phone]     Name:  Tobie Lords. Date:  04/24/15     Deduct:  $0      Out of Pocket Max:  $4900      Life Max:  none CIR:  $345 days 1-6, $0 days 6 and beyond      SNF:  $0 days 1-20; $160 per day, 21-51; $0 days 52-100 Outpatient:  $40 copay     Co-Pay:  Home Health: 100%      Co-Pay:   DME:  80%/20%     Co-Pay:   Providers:  [place] SECONDARY:       Policy#:       Subscriber:  CM Name:       Phone#:      Fax#:  Pre-Cert#:       Employer:  Benefits:  Phone #:      Name:  Eff. Date:      Deduct:       Out of Pocket Max:       Life Max:  CIR:       SNF:  Outpatient:      Co-Pay:  Home Health:       Co-Pay:  DME:      Co-Pay:   Medicaid Application Date:       Case Manager:  Disability Application Date:       Case Worker:   Emergency Contact Information Contact Information    Name Relation Home Work Mobile   [name] Mother [phone]       Current Medical History  Patient Admitting Diagnosis: infarct left paracentral midbrain and posterior left paracentral pons History of Present Illness: [name] is a 55 y.o. 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 Patient lives with parent. One level home with ramped entrance. Mother is 39 years old and  limited assistance. Patient uses a rolling walker prior to admission. Presented 05/22/2015 with double vision decrease in balance and recent fall while using a walker. MRI of the brain showed acute small nonhemorrhagic infarct left paracentral midbrain and posterior left paracentral pons. Remote small infarcts scattered throughout the centrum semiovale ovale, remote thalamic infarct. Chronic small vessel disease changes. CT angiogram head showed no major intracranial arterial occlusion. Echocardiogram with ejection fraction of 40%. Systolic function moderately reduced. No cardiac source of emboli identified.. Patient did not receive TPA. Neurology services consulted maintain on aspirin and Plavix for CVA prophylaxis. Subcutaneous Lovenox for DVT prophylaxis. Maintain on regular diet consistency. Physical and occupational therapy evaluations completed with recommendations of physical medicine rehabilitation consult.  Total: 2 NIH    Past Medical History  Past Medical History  Diagnosis Date  . Hypertension   . Vitamin D deficiency   . Coronary artery disease     a. (12/11) NSTEMI, in the setting of DKA, s/p Cath--LAD totalled in the midportion, diag 70% sten (small 2-mm ), LCx nonobst, OM1 40%, OM2 30%, RCA- no dz. chronic calcified LAD --> no intervention.  b. (06/2010) NSTEMI s/p cath showing same findings   . Ischemic cardiomyopathy   . Orthostatic hypotension     a. sycopal episode in 12/11   . Diabetes mellitus     a. Insulin dependant Type I   . Hyperlipidemia   . Diabetic nephropathy (HCC)   . Diabetic retinopathy   . Gastroparesis   . Depression   . Systolic heart failure     a. (08/2012) EF: 40% Mod hypokin in mid-dist-ant-sept myocard--> psuedonml L vent fill pattern, Grd 2 diast dyscfxn  . Iron deficiency anemia   . Hx MRSA infection   . Osteopenia   . Esophagitis   . NSTEMI (non-ST elevated myocardial infarction) (HCC)     a. (03/2010) NSTEMI during admission for DKA  b.  .  Stroke [facility])     a. (2012) residual R hand/arm weakness  b. while on ASA and plavix  . Diabetic neuropathy (HCC)     Family History  family history includes Diabetes in her maternal grandfather; Pancreatic cancer in her father. There is no history of Colon cancer.  Prior Rehab/Hospitalizations:  Has the patient had major surgery during 100 days prior to admission? No  Current Medications   Current facility-administered medications:  .   stroke: mapping our early stages of recovery book, , Does not apply, Once, Calvert Cantor, MD .  aspirin EC tablet 81 mg, 81 mg, Oral, QHS, Calvert Cantor, MD, 81 mg at 05/24/15 2130 .  carvedilol (COREG) tablet 6.25 mg, 6.25 mg, Oral, BID, Maretta Bees, MD, 6.25 mg at 05/25/15 1133 .  citalopram (CELEXA) tablet 20 mg, 20 mg, Oral, Daily, Calvert Cantor, MD, 20 mg at 05/25/15 1133 .  clopidogrel (PLAVIX) tablet 75 mg, 75 mg, Oral, Daily, Calvert Cantor, MD, 75 mg at 05/25/15 1131 .  digoxin (LANOXIN) tablet 0.0625 mg, 0.0625 mg, Oral, QODAY, Calvert Cantor, MD, 0.0625 mg at 05/25/15 1132 .  DULoxetine (CYMBALTA) DR capsule 30 mg, 30 mg, Oral, Daily, Calvert Cantor, MD, 30 mg at 05/25/15 1132 .  enoxaparin (LOVENOX) injection 40 mg, 40 mg, Subcutaneous, Q24H, Calvert Cantor, MD, 40 mg at 05/24/15 1947 .  ferrous sulfate tablet 650 mg, 650 mg, Oral, Q supper, Calvert Cantor, MD, 650 mg at 05/24/15 1825 .  furosemide (LASIX) tablet 20 mg, 20 mg, Oral, Daily, Calvert Cantor, MD, 20 mg at 05/25/15 1132 .  insulin aspart (novoLOG) injection 0-15 Units, 0-15 Units, Subcutaneous, TID WC, Calvert Cantor, MD, 3 Units at 05/25/15 1135 .  insulin aspart (novoLOG) injection 4 Units, 4 Units, Subcutaneous, TID WC, Maretta Bees, MD, 4 Units at 05/25/15 1200 .  insulin detemir (LEVEMIR) injection 12 Units, 12 Units, Subcutaneous, Daily, Maretta Bees, MD, 12 Units at 05/25/15 1000 .  rosuvastatin (CRESTOR) tablet 40 mg, 40 mg, Oral, Q supper, Calvert Cantor, MD, 40 mg at 05/24/15  1825  Patients Current Diet: Diet heart healthy/carb modified Room service appropriate?: Yes; Fluid consistency:: Thin Diet - low sodium heart healthy  Precautions / Restrictions Precautions Precautions: Fall Restrictions Weight Bearing Restrictions: No   Has the patient  had 2 or more falls or a fall with injury in the past year?Yes  One fall with bruising of leg  Prior Activity Level Limited Community (1-2x/wk): Pt's mom reports pt. goes out of house 2-3 times per week.  Pt. goes to church with her sister, goes out to eat once a week with a friend and will ride with her mom to get groceries or to the pharmacy  Home Assistive Devices / Equipment Home Assistive Devices/Equipment: Medical laboratory scientific officer (specify quad or straight), Environmental consultant (specify type) Home Equipment: Environmental consultant - 2 wheels, [employer] - quad, Information systems manager  Prior Device Use: Indicate devices/aids used by the patient prior to current illness, exacerbation or injury? small based quaad cane and RW  Prior Functional Level Prior Function Level of Independence: Independent with assistive device(s) Comments: Uses cane indoors, RW when out of home  Self Care: Did the patient need help bathing, dressing, using the toilet or eating?  Needed some help with dressing upper body due to frozen shoulders  Indoor Mobility: Did the patient need assistance with walking from room to room (with or without device)? Independent  Stairs: Did the patient need assistance with internal or external stairs (with or without device)?  N/a, used ramp PTA  Functional Cognition: Did the patient need help planning regular tasks such as shopping or remembering to take medications? Dependent; mom managed pt's pill box  Current Functional Level Cognition  Arousal/Alertness: Awake/alert Overall Cognitive Status: History of cognitive impairments - at baseline Orientation Level: Oriented X4 Following Commands:  (follows commands inconsistently) Safety/Judgement: Decreased  awareness of safety General Comments: slow processing noted, may be her baseline Attention: Selective Selective Attention: Appears intact Memory: Impaired Memory Impairment: Retrieval deficit (mild deficits that pt describes as baseline) Awareness: Appears intact Problem Solving: Appears intact    Extremity Assessment (includes Sensation/Coordination)  Upper Extremity Assessment: RUE deficits/detail, LUE deficits/detail RUE Deficits / Details: shoulder flexion - 90 degrees. Pt reports , "frozen shoulder" LUE Deficits / Details: shoulder flexion - 90 degrees. Pt reports , "frozen shoulder"  Lower Extremity Assessment: Defer to PT evaluation RLE Deficits / Details: knee ext 3+/5, hip flex 3-/5 RLE Coordination: decreased fine motor, decreased gross motor    ADLs  Overall ADL's : Needs assistance/impaired Eating/Feeding: Set up Eating/Feeding Details (indicate cue type and reason): opening containers and cutting food Grooming: Wash/dry face, Sitting, Applying deodorant, Min guard, Standing, Oral care (Set up/Supervision-Min guard assist) Grooming Details (indicate cue type and reason): Pt requiring min cues for initiation and sequencing of tasks. Pt needed min A for standing balance at sink side as she exhibits R lean in standing.  Upper Body Bathing: Set up, Supervision/ safety, Sitting Upper Body Dressing : Minimal assistance, Sitting Lower Body Dressing: Set up, Sitting/lateral leans Lower Body Dressing Details (indicate cue type and reason): donned/doffed socks Toilet Transfer: Minimal assistance, Ambulation, RW (Min guard-Min assist for sit to stand from chair/bed; Min **) Functional mobility during ADLs: Min guard, Rolling walker General ADL Comments: pt leaning to right at sink while standing-cues given to try to correct posture.    Mobility  Overal bed mobility: Needs Assistance Bed Mobility: Supine to Sit Supine to sit: Supervision General bed mobility comments: seated in  recliner chair upon entering the room    Transfers  Overall transfer level: Needs assistance Equipment used: Rolling walker (2 wheeled), None Transfers: Sit to/from Stand Sit to Stand: Mod assist General transfer comment: pt with inadequate forward lean/weight shift and requires incr assist to prevent falling back down  into sitting; repeated for exercise x 8    Ambulation / Gait / Stairs / Wheelchair Mobility  Ambulation/Gait Ambulation/Gait assistance: Min assist, Mod assist Ambulation Distance (Feet): 50 Feet (standing rest, 35) Assistive device: Rolling walker (2 wheeled), 1 person hand held assist Gait Pattern/deviations: Step-through pattern, Decreased stride length, Decreased weight shift to left, Drifts right/left, Wide base of support General Gait Details: Using eye patch, began with RW and pt with running into objects on her right; tends to walk behind walker; pt reports she normally uses a cane and tried HHA on her right; pt with incr Rt lean required up to mod assist to recover  Gait velocity: decreased Gait velocity interpretation: <1.8 ft/sec, indicative of risk for recurrent falls    Posture / Balance Balance Overall balance assessment: Needs assistance Sitting-balance support: Single extremity supported Sitting balance-Leahy Scale: Fair Postural control: Right lateral lean Standing balance support: No upper extremity supported Standing balance-Leahy Scale: Poor Standing balance comment: posterior and Rt lean; worked on standing balance and wt-shift to midline after each sit to stand with pt remaining leaning to her Rt; when assisted to midline, pt feels she is falling to her left    Special needs/care consideration BiPAP/CPAP  no CPM   no Continuous Drip IV  no  Dialysis   no        Life Vest  no Oxygen   no Special Bed   no Trach Size   no Wound Vac (area)   no      Location   no Skin  WDL                              Bowel mgmt:   Last BM 05/22/15, continent with  BSC and bathroom with assist Bladder mgmt   Continent, BSC with assist Diabetic mgmt A1c 11.0     Previous Home Environment Living Arrangements: Parent Available Help at Discharge: Family, Available 24 hours/day Type of Home: House Home Layout: One level Home Access: Ramped entrance Bathroom Shower/Tub: Tub/shower unit, Engineer, building services: Handicapped height Bathroom Accessibility: Yes How Accessible: Accessible via walker Home Care Services: No Additional Comments: pt's mother is 47 yo, is independent and does the driving, though she recently had a CABG. Pt reports her mother drives and does all the cooking while she helps with cleaning of home.  Discharge Living Setting Plans for Discharge Living Setting: Patient's home Type of Home at Discharge: House Discharge Home Layout: One level Discharge Home Access: Ramped entrance Discharge Bathroom Shower/Tub: Tub/shower unit Discharge Bathroom Toilet: Handicapped height Discharge Bathroom Accessibility: Yes How Accessible: Accessible via walker Does the patient have any problems obtaining your medications?: No  Social/Family/Support Systems Patient Roles: Parent (pt. has one adult son) Anticipated Caregiver: pt's mother [name] Anticipated Caregiver's Contact Information: [phone], house phone only as no cell service at pt's home Ability/Limitations of Caregiver: pt's mom is able to provide 24 hour supervision but no physical assist due to recent CABG Caregiver Availability: 24/7 Discharge Plan Discussed with Primary Caregiver: Yes Is Caregiver In Agreement with Plan?: Yes Does Caregiver/Family have Issues with Lodging/Transportation while Pt is in Rehab?: No   Goals/Additional Needs Patient/Family Goal for Rehab: mod I and supervision with PT; mod I with OT; n/a SLP Expected length of stay: 7-10 days Cultural Considerations: "[employer]"  Dietary Needs: heart healthy, carb modified, thin liquids Equipment Needs:  TBD Special Service Needs: pt. has premorbid short term memory issues  per pt's mom Pt/Family Agrees to Admission and willing to participate: Yes Program Orientation Provided & Reviewed with Pt/Caregiver Including Roles  & Responsibilities: Yes   Decrease burden of Care through IP rehab admission: no   Possible need for SNF placement upon discharge: not expected   Patient Condition: This patient's condition remains as documented in the consult dated 05/23/15 , in which the Rehabilitation Physician determined and documented that the patient's condition is appropriate for intensive rehabilitative care in an inpatient rehabilitation facility. Will admit to inpatient rehab today.  Preadmission Screen Completed By:  Weldon Picking, 05/25/2015 4:15 PM ______________________________________________________________________   Discussed status with Dr. Allena Katz on 05/25/15 at 1557 at and received telephone approval for admission today.  Admission Coordinator:  Weldon Picking, time 4098 /Date 05/25/15

## 2015-05-25 NOTE — Progress Notes (Signed)
..  Discharge orders received, Pt for discharge, transferred to 4W18 report given to stacy. staff bought pt downstairs via wheelchair. 05/25/15 1725

## 2015-05-25 NOTE — H&P (View-Only) (Signed)
Physical Medicine and Rehabilitation Admission H&P    Chief Complaint  Patient presents with  . Weakness  : HPI: [name] is a 55 y.o. 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 Patient lives with parent. One level home with ramped entrance. Mother is 68 years old and limited assistance. Patient uses a rolling walker prior to admission. Presented 05/22/2015 with double vision decrease in balance and recent fall while using a walker. MRI of the brain showed acute small nonhemorrhagic infarct left paracentral midbrain and posterior left paracentral pons. Remote small infarcts scattered throughout the centrum semiovale ovale, remote thalamic infarct. Chronic small vessel disease changes. CT angiogram head showed no major intracranial arterial occlusion. Echocardiogram with ejection fraction of 40%. Systolic function moderately reduced. No cardiac source of emboli identified.. Patient did not receive TPA. Neurology services consulted maintain on aspirin and Plavix for CVA prophylaxis. Subcutaneous Lovenox for DVT prophylaxis. Maintain on regular diet consistency. Physical and occupational therapy evaluations completed with recommendations of physical medicine rehabilitation consult.  ROS Constitutional: Negative for fever and chills.  HENT: Negative for hearing loss.  Eyes: Positive for double vision. Negative for blurred vision.  Respiratory: Negative for cough and shortness of breath.  Cardiovascular: Negative for chest pain and palpitations.  Gastrointestinal:   GERD  Genitourinary: Negative for dysuria and hematuria.  Musculoskeletal: Positive for myalgias, joint pain and falls.  Skin: Negative for rash.  Neurological: Positive for dizziness. Negative for seizures, loss of consciousness, weakness  and headaches.  Psychiatric/Behavioral: Positive for depression.   Anxiety  All other systems reviewed and are negative   Past Medical History  Diagnosis Date  . Hypertension   . Vitamin D deficiency   . Coronary artery disease     a. (12/11) NSTEMI, in the setting of DKA, s/p Cath--LAD totalled in the midportion, diag 70% sten (small 2-mm ), LCx nonobst, OM1 40%, OM2 30%, RCA- no dz. chronic calcified LAD --> no intervention.  b. (06/2010) NSTEMI s/p cath showing same findings   . Ischemic cardiomyopathy   . Orthostatic hypotension     a. sycopal episode in 12/11   . Diabetes mellitus     a. Insulin dependant Type I   . Hyperlipidemia   . Diabetic nephropathy ([place])   . Diabetic retinopathy   . Gastroparesis   . Depression   . Systolic heart failure     a. (08/2012) EF: 40% Mod hypokin in mid-dist-ant-sept myocard--> psuedonml L vent fill pattern, Grd 2 diast dyscfxn  . Iron deficiency anemia   . Hx MRSA infection   . Osteopenia   . Esophagitis   . NSTEMI (non-ST elevated myocardial infarction) ([place])     a. (03/2010) NSTEMI during admission for DKA  b.  . Stroke [facility])     a. (2012) residual R hand/arm weakness  b. while on ASA and plavix  . Diabetic neuropathy [facility])    Past Surgical History  Procedure Laterality Date  . Appendectomy  1980  . Tubal ligation  1987  . Cesarean section  1984  . Refractive surgery      x3   Family History  Problem Relation Age of Onset  . Pancreatic cancer Father   . Colon cancer Neg Hx   . Diabetes Maternal Grandfather    Social History:  reports that she has never smoked. She has never used  smokeless tobacco. She reports that she does not drink alcohol or use illicit drugs. Allergies:  Allergies  Allergen Reactions  . Erythromycin Other (See Comments)    GI upset  . Food Nausea Only    Green peas  . Metoclopramide Hcl Other (See Comments)     fatique, depression   Medications Prior to Admission  Medication Sig Dispense  Refill  . aspirin EC 81 MG tablet Take 81 mg by mouth at bedtime.     . Calcium Carb-Cholecalciferol (CALCIUM 500 +D PO) Take 500 mg by mouth daily with lunch.    . carvedilol (COREG) 6.25 MG tablet Take 6.25 mg by mouth 2 (two) times daily.    . citalopram (CELEXA) 20 MG tablet Take 20 mg by mouth daily.     . clopidogrel (PLAVIX) 75 MG tablet Take 1 tablet (75 mg total) by mouth daily. 30 tablet 6  . digoxin (LANOXIN) 0.125 MG tablet Take 0.0625 mg by mouth every other day.    . DULoxetine (CYMBALTA) 30 MG capsule Take 30 mg by mouth daily.     . ferrous sulfate 325 (65 FE) MG tablet Take 650 mg by mouth daily with supper.     . furosemide (LASIX) 20 MG tablet TAKE 1 TABLET (20 MG TOTAL) BY MOUTH DAILY. 30 tablet 6  . Insulin Degludec (TRESIBA FLEXTOUCH) 200 UNIT/ML SOPN Inject 20 Units into the skin daily after breakfast.    . insulin lispro (HUMALOG KWIKPEN) 100 UNIT/ML KiwkPen Inject 1-7 Units into the skin 3 (three) times daily as needed (CBG >150). Per sliding scale: CBG 151-200 1 unit, 201-250 2 units, 251-300 3 units, 301-350 5 units, >350 7 units    . LORazepam (ATIVAN) 0.5 MG tablet Take 0.5 mg by mouth See admin instructions. Take 1 tablet (0.5 mg) by mouth daily at bedtime, may also take 1 tablet (0.5 mg) in the morning as needed for anxiety    . Multiple Vitamin (MULTIVITAMIN WITH MINERALS) TABS tablet Take 1 tablet by mouth daily with lunch.    . rosuvastatin (CRESTOR) 40 MG tablet Take 40 mg by mouth daily with supper.  30 tablet 11  . Vitamin D, Ergocalciferol, (DRISDOL) 50000 UNITS CAPS Take 50,000 Units by mouth every 7 (seven) days. Saturday      Home: Home Living Family/patient expects to be discharged to:: Private residence Living Arrangements: Parent Available Help at Discharge: Family, Available 24 hours/day Type of Home: House Home Access: [place]: One level Bathroom Shower/Tub: Tub/shower unit, Architectural technologist: Standard Bathroom  Accessibility: Yes Home Equipment: Environmental consultant - 2 wheels, Cane - quad, Shower seat Additional Comments: pt's mother is 79 yo, is independent and does the driving, though she recently had a CABG. Pt reports her mother drives and does all the cooking while she helps with cleaning of home.   Functional History: Prior Function Level of Independence: Independent with assistive device(s) Comments: Pt reports ambulation with quad cane or RW at baseline, shower seat for bathing, R sided weakness  Functional Status:  Mobility: Bed Mobility Overal bed mobility: Modified Independent General bed mobility comments: seated in recliner chair upon entering the room Transfers Overall transfer level: Needs assistance Equipment used: Rolling walker (2 wheeled) Transfers: Sit to/from Stand Sit to Stand: Min assist General transfer comment: Min A to stand from recliner chair with min verbal cues for hand placement and technique Ambulation/Gait Ambulation/Gait assistance: Min assist Ambulation Distance (Feet): 100 Feet Assistive device: Rolling walker (2 wheeled) Gait Pattern/deviations: Decreased  step length - right General Gait Details: Gauze taped over left eye for session which corrected diplopia. pt drifts right, vc's needed to correct. Multiple posterior LOB with min A to correct, noted right knee weakness Gait velocity: decreased Gait velocity interpretation: <1.8 ft/sec, indicative of risk for recurrent falls    ADL: ADL Overall ADL's : Needs assistance/impaired Eating/Feeding: Set up Eating/Feeding Details (indicate cue type and reason): opening containers and cutting food Grooming: Oral care, Supervision/safety, Standing Grooming Details (indicate cue type and reason): Pt requiring min cues for initiation and sequencing of tasks. Pt needed min A for standing balance at sink side as she exhibits R lean in standing.  Lower Body Dressing: Min guard, Sitting/lateral leans, Sit to/from stand Lower  Body Dressing Details (indicate cue type and reason): socks only this session. Pt seated and able to don/doff B socks with increased time and steady assist for balance.  General ADL Comments: Pt with decreased safety awareness this session. L eye occluded for double vision concerns with ambulation. No c/o nausea with movement. Pt standing at sink for grooming tasks with R lateral lean and min A needed for standing balance with functional task.  Cognition: Cognition Overall Cognitive Status: Impaired/Different from baseline Orientation Level: Oriented X4 Cognition Arousal/Alertness: Awake/alert Behavior During Therapy: WFL for tasks assessed/performed Overall Cognitive Status: Impaired/Different from baseline Area of Impairment: Problem solving, Safety/judgement Safety/Judgement: Decreased awareness of safety Problem Solving: Slow processing, Requires verbal cues, Difficulty sequencing, Decreased initiation General Comments: unsure if processing is baseline  Physical Exam: Blood pressure 124/66, pulse 77, temperature 98.2 F (36.8 C), temperature source Oral, resp. rate 16, height 5' 4"  (1.626 m), weight 70.5 kg (155 lb 6.8 oz), last menstrual period 03/13/2011, SpO2 99 %. Physical Exam Constitutional: She is oriented to person, place, and time. She appears well-developed and well-nourished.  HENT:  Head: Normocephalic and atraumatic.  Eyes: Conjunctivae and EOM are normal.  Pupils round and reactive to light  Neck: Normal range of motion. Neck supple. No thyromegaly present.  Cardiovascular: Normal rate and regular rhythm.  Respiratory: Effort normal and breath sounds normal. No respiratory distress.  GI: Soft. Bowel sounds are normal. She exhibits no distension.  Musculoskeletal: She exhibits no edema or tenderness.  Neurological: She is alert and oriented to person, place, and time.  Makes good eye contact with examiner.  Follows simple commands.  Fair awareness of  deficits. Motor:LUE/LLE 5/5 proximal to distal RUE/RLE: 4+/5 proximal to distal Sensation intact to light touch Skin: Skin is warm and dry.  Psychiatric: She has a normal mood and affect. Her behavior is normal  Results for orders placed or performed during the hospital encounter of 05/22/15 (from the past 48 hour(s))  CBC with Differential     Status: None   Collection Time: 05/22/15  2:14 PM  Result Value Ref Range   WBC 7.4 4.0 - 10.5 K/uL   RBC 4.73 3.87 - 5.11 MIL/uL   Hemoglobin 14.3 12.0 - 15.0 g/dL   HCT 43.3 36.0 - 46.0 %   MCV 91.5 78.0 - 100.0 fL   MCH 30.2 26.0 - 34.0 pg   MCHC 33.0 30.0 - 36.0 g/dL   RDW 12.9 11.5 - 15.5 %   Platelets 220 150 - 400 K/uL   Neutrophils Relative % 62 %   Neutro Abs 4.5 1.7 - 7.7 K/uL   Lymphocytes Relative 24 %   Lymphs Abs 1.8 0.7 - 4.0 K/uL   Monocytes Relative 6 %   Monocytes Absolute 0.4  0.1 - 1.0 K/uL   Eosinophils Relative 8 %   Eosinophils Absolute 0.6 0.0 - 0.7 K/uL   Basophils Relative 0 %   Basophils Absolute 0.0 0.0 - 0.1 K/uL  Basic metabolic panel     Status: Abnormal   Collection Time: 05/22/15  2:14 PM  Result Value Ref Range   Sodium 145 135 - 145 mmol/L   Potassium 4.0 3.5 - 5.1 mmol/L   Chloride 108 101 - 111 mmol/L   CO2 29 22 - 32 mmol/L   Glucose, Bld 121 (H) 65 - 99 mg/dL   BUN 19 6 - 20 mg/dL   Creatinine, Ser 1.13 (H) 0.44 - 1.00 mg/dL   Calcium 9.3 8.9 - 10.3 mg/dL   GFR calc non Af Amer 53 (L) >60 mL/min   GFR calc Af Amer >60 >60 mL/min    Comment: (NOTE) The eGFR has been calculated using the CKD EPI equation. This calculation has not been validated in all clinical situations. eGFR's persistently <60 mL/min signify possible Chronic Kidney Disease.    Anion gap 8 5 - 15  CBG monitoring, ED     Status: None   Collection Time: 05/22/15  4:25 PM  Result Value Ref Range   Glucose-Capillary 66 65 - 99 mg/dL  CBG monitoring, ED     Status: Abnormal   Collection Time: 05/22/15  5:42 PM  Result  Value Ref Range   Glucose-Capillary 130 (H) 65 - 99 mg/dL  Glucose, capillary     Status: Abnormal   Collection Time: 05/22/15  8:03 PM  Result Value Ref Range   Glucose-Capillary 129 (H) 65 - 99 mg/dL   Comment 1 Notify RN    Comment 2 Document in Chart   Lipid panel     Status: None   Collection Time: 05/23/15  5:00 AM  Result Value Ref Range   Cholesterol 126 0 - 200 mg/dL   Triglycerides 115 <150 mg/dL   HDL 45 >40 mg/dL   Total CHOL/HDL Ratio 2.8 RATIO   VLDL 23 0 - 40 mg/dL   LDL Cholesterol 58 0 - 99 mg/dL    Comment:        Total Cholesterol/HDL:CHD Risk Coronary Heart Disease Risk Table                     Men   Women  1/2 Average Risk   3.4   3.3  Average Risk       5.0   4.4  2 X Average Risk   9.6   7.1  3 X Average Risk  23.4   11.0        Use the calculated Patient Ratio above and the CHD Risk Table to determine the patient's CHD Risk.        ATP III CLASSIFICATION (LDL):  <100     mg/dL   Optimal  100-129  mg/dL   Near or Above                    Optimal  130-159  mg/dL   Borderline  160-189  mg/dL   High  >190     mg/dL   Very High   Hemoglobin A1c     Status: Abnormal   Collection Time: 05/23/15  5:10 AM  Result Value Ref Range   Hgb A1c MFr Bld 11.0 (H) 4.8 - 5.6 %    Comment: (NOTE)         Pre-diabetes: 5.7 -  6.4         Diabetes: >6.4         Glycemic control for adults with diabetes: <7.0    Mean Plasma Glucose 269 mg/dL    Comment: (NOTE) Performed At: [facility] [place], [place] [id number] Lindon Romp MD [id number]   Glucose, capillary     Status: Abnormal   Collection Time: 05/23/15  5:22 AM  Result Value Ref Range   Glucose-Capillary 26 (LL) 65 - 99 mg/dL  Glucose, capillary     Status: Abnormal   Collection Time: 05/23/15  5:45 AM  Result Value Ref Range   Glucose-Capillary 110 (H) 65 - 99 mg/dL  CBC     Status: Abnormal   Collection Time: 05/23/15  8:15 AM  Result Value Ref Range   WBC 7.7 4.0  - 10.5 K/uL   RBC 4.96 3.87 - 5.11 MIL/uL   Hemoglobin 15.4 (H) 12.0 - 15.0 g/dL   HCT 45.2 36.0 - 46.0 %   MCV 91.1 78.0 - 100.0 fL   MCH 31.0 26.0 - 34.0 pg   MCHC 34.1 30.0 - 36.0 g/dL   RDW 13.1 11.5 - 15.5 %   Platelets 236 150 - 400 K/uL  Comprehensive metabolic panel     Status: Abnormal   Collection Time: 05/23/15  8:15 AM  Result Value Ref Range   Sodium 143 135 - 145 mmol/L   Potassium 4.2 3.5 - 5.1 mmol/L   Chloride 107 101 - 111 mmol/L   CO2 24 22 - 32 mmol/L   Glucose, Bld 204 (H) 65 - 99 mg/dL   BUN 18 6 - 20 mg/dL   Creatinine, Ser 1.12 (H) 0.44 - 1.00 mg/dL   Calcium 9.7 8.9 - 10.3 mg/dL   Total Protein 6.8 6.5 - 8.1 g/dL   Albumin 3.5 3.5 - 5.0 g/dL   AST 22 15 - 41 U/L   ALT 18 14 - 54 U/L   Alkaline Phosphatase 56 38 - 126 U/L   Total Bilirubin 0.7 0.3 - 1.2 mg/dL   GFR calc non Af Amer 54 (L) >60 mL/min   GFR calc Af Amer >60 >60 mL/min    Comment: (NOTE) The eGFR has been calculated using the CKD EPI equation. This calculation has not been validated in all clinical situations. eGFR's persistently <60 mL/min signify possible Chronic Kidney Disease.    Anion gap 12 5 - 15  Lactic acid, plasma     Status: None   Collection Time: 05/23/15  8:15 AM  Result Value Ref Range   Lactic Acid, Venous 1.9 0.5 - 2.0 mmol/L  Lactic acid, plasma     Status: None   Collection Time: 05/23/15 11:50 AM  Result Value Ref Range   Lactic Acid, Venous 1.4 0.5 - 2.0 mmol/L  Glucose, capillary     Status: Abnormal   Collection Time: 05/23/15 12:21 PM  Result Value Ref Range   Glucose-Capillary 192 (H) 65 - 99 mg/dL   Comment 1 Notify RN    Comment 2 Document in Chart   Glucose, capillary     Status: Abnormal   Collection Time: 05/23/15  4:35 PM  Result Value Ref Range   Glucose-Capillary 279 (H) 65 - 99 mg/dL   Comment 1 Notify RN    Comment 2 Document in Chart   Glucose, capillary     Status: Abnormal   Collection Time: 05/23/15 10:10 PM  Result Value Ref Range    Glucose-Capillary 268 (  H) 65 - 99 mg/dL   Comment 1 Notify RN    Comment 2 Document in Chart    Ct Angio Head W/cm &/or Wo Cm  05/23/2015  CLINICAL DATA:  Diplopia. Generalized weakness and blurred vision. Small acute infarcts in the midbrain and pons on brain MRI. EXAM: CT ANGIOGRAPHY HEAD TECHNIQUE: Multidetector CT imaging of the head was performed using the standard protocol during bolus administration of intravenous contrast. Multiplanar CT image reconstructions and MIPs were obtained to evaluate the vascular anatomy. CONTRAST:  43m OMNIPAQUE IOHEXOL 350 MG/ML SOLN COMPARISON:  Brain MRI earlier today FINDINGS: CTA HEAD Anterior circulation: Internal carotid arteries are patent from skullbase to carotid termini with mild atherosclerotic plaque bilaterally. There is mild right proximal supraclinoid ICA stenosis. ACAs and MCAs are patent without evidence of major branch occlusion or significant stenosis. No intracranial aneurysm is identified. Posterior circulation: The visualized distal vertebral arteries are patent with the left being dominant. There is mild atherosclerotic irregularity of the left V4 segment without stenosis. AICA and SCA origins are patent. Basilar artery is patent without stenosis. There is a medium-sized left posterior communicating artery with slight hypoplasia of the left P1 segment. There is also a tiny right posterior communicating artery. PCAs are patent with mild branch vessel irregularity but no significant proximal stenosis. Venous sinuses: Patent. Anatomic variants: None. Delayed phase: No abnormal enhancement. Small acute brainstem infarcts on MRI are not well seen by CT. Chronic small vessel ischemic changes are noted in the cerebral white matter bilaterally. Mildly advanced cerebral atrophy for age. IMPRESSION: 1. No major intracranial arterial occlusion. 2. Mild intracranial atherosclerosis with mild right ICA stenosis. Electronically Signed   By: ALogan BoresM.D.   On:  05/23/2015 10:23   Dg Chest 2 View  05/23/2015  CLINICAL DATA:  Weakness .  Fever. EXAM: CHEST  2 VIEW COMPARISON:  09/15/2014 . FINDINGS: Mediastinum and hilar structures normal. Lungs are clear. Heart size normal. No pleural effusion or pneumothorax. Old right seventh rib fracture. Chest is stable from prior exam. IMPRESSION: No acute cardiopulmonary disease. Electronically Signed   By: TMarcello Moores Register   On: 05/23/2015 07:39   Ct Head Wo Contrast  05/22/2015  CLINICAL DATA:  Double vision at of left eye since yesterday. EXAM: CT HEAD WITHOUT CONTRAST TECHNIQUE: Contiguous axial images were obtained from the base of the skull through the vertex without intravenous contrast. COMPARISON:  05/26/2013. FINDINGS: There is no evidence for acute hemorrhage, hydrocephalus, mass lesion, or abnormal extra-axial fluid collection. No definite CT evidence for acute infarction. Patchy low attenuation in the deep hemispheric and periventricular white matter is nonspecific, but likely reflects chronic microvascular ischemic demyelination. Diffuse loss of parenchymal volume is consistent with atrophy. The visualized paranasal sinuses and mastoid air cells are clear. IMPRESSION: Stable exam. Mild atrophy for age with chronic small vessel white matter ischemic demyelination. No acute intracranial abnormality. Electronically Signed   By: EMisty StanleyM.D.   On: 05/22/2015 15:25   Mr BJeri CosWWFContrast  05/23/2015  CLINICAL DATA:  55year old hypertensive diabetic female with anemia and hyperlipidemia presenting with double vision. Recent fall. Subsequent encounter. EXAM: MRI HEAD WITHOUT AND WITH CONTRAST TECHNIQUE: Multiplanar, multiecho pulse sequences of the brain and surrounding structures were obtained without and with intravenous contrast. CONTRAST:  146mMULTIHANCE GADOBENATE DIMEGLUMINE 529 MG/ML IV SOLN COMPARISON:  05/22/2015 CT.  08/28/2010 MR. FINDINGS: Sequences are motion degraded. Patient was not able to  complete post-contrast coronal imaging. Acute small nonhemorrhagic infarcts  left paracentral midbrain (extending to the inferior medial aspect left peri third ventricular region) and posterior left paracentral pons. Remote small infarcts scattered throughout the centrum semi ovale/coronal radiata bilaterally. Remote thalamic infarcts. Chronic small vessel disease changes. Global atrophy without hydrocephalus. Mega cisterna magna incidentally noted. No intracranial mass or abnormal enhancement noted on motion degraded exam. Major intracranial vascular structures are patent. Partial opacification right mastoid air cells without obstructing lesion of eustachian tube noted. Minimal mucosal thickening ethmoid sinus air cells and inferior maxillary sinuses. Minimal exophthalmos.  Appearance of prior lens replacement. Slightly heterogeneous bone marrow may be related to patient's habitus or underlying anemia IMPRESSION: Sequences are motion degraded. Patient was not able to complete post-contrast coronal imaging. Acute small nonhemorrhagic infarcts left paracentral midbrain and posterior left paracentral pons. Remote small infarcts scattered throughout the centrum semiovale/coronal radiata bilaterally. Remote thalamic infarcts. Chronic small vessel disease changes. Global atrophy without hydrocephalus. No intracranial mass or abnormal enhancement noted on motion degraded exam. Partial opacification right mastoid air cells without obstructing lesion of eustachian tube noted. Minimal mucosal thickening ethmoid sinus air cells and inferior maxillary sinuses. Electronically Signed   By: Genia Del M.D.   On: 05/23/2015 10:02    Medical Problem List and Plan: 1.  Diplopia, gait deficits secondary to left paracentral-midbrain and posterior left paracentral pontine infarct 2.  DVT Prophylaxis/Anticoagulation: Subcutaneous Lovenox. Monitor platelet counts and any signs of bleeding 3. Pain Management: Cymbalta 30 mg  daily 4. Mood: Celexa 20 mg daily. Provide emotional support 5. Neuropsych: This patient is capable of making decisions on her own behalf. 6. Skin/Wound Care: Routine skin checks 7. Fluids/Electrolytes/Nutrition: Routine I&O with follow up labs 8. Hypertension. Coreg 6.25 mg twice a day, Lanoxin 0.0625 mg every other day. Monitor with increased mobility 9. Chronic systolic congestive heart failure with ischemic cardiomyopathy/NSTEMI. Lasix 20 mg daily. Monitor for any signs of fluid overload 10. Diabetes mellitus and peripheral neuropathy. Hemoglobin A1c 11. Levemir 12 units daily. Check blood sugars before meals and at bedtime. Diabetic teaching 11. Iron deficiency anemia. Ferrous sulfate 650 mg daily. 12. Hyperlipidemia. Crestor  Post Admission Physician Evaluation: 1. Functional deficits secondary to left paracentral-midbrain and posterior left paracentral pontine infarct. 2. Patient is admitted to receive collaborative, interdisciplinary care between the physiatrist, rehab nursing staff, and therapy team. 3. Patient's level of medical complexity and substantial therapy needs in context of that medical necessity cannot be provided at a lesser intensity of care such as a SNF. 4. Patient has experienced substantial functional loss from his/her baseline which was documented above under the "Functional History" and "Functional Status" headings.  Judging by the patient's diagnosis, physical exam, and functional history, the patient has potential for functional progress which will result in measurable gains while on inpatient rehab.  These gains will be of substantial and practical use upon discharge  in facilitating mobility and self-care at the household level. 5. Physiatrist will provide 24 hour management of medical needs as well as oversight of the therapy plan/treatment and provide guidance as appropriate regarding the interaction of the two. 6. 24 hour rehab nursing will assist with safety,  disease management and patient education and help integrate therapy concepts, techniques,education, etc. 7. PT will assess and treat for/with: Lower extremity strength, range of motion, stamina, balance, functional mobility, safety, adaptive techniques and equipment, coping skills, pain control, stroke education.   Goals are: Mod I/Supervision. 8. OT will assess and treat for/with: ADL's, functional mobility, safety, upper extremity strength, adaptive techniques and equipment,  ego support, and community reintegration.   Goals are: Mod I. Therapy may proceed with showering this patient. 9. Case Management and Social Worker will assess and treat for psychological issues and discharge planning. 10. Team conference will be held weekly to assess progress toward goals and to determine barriers to discharge. 11. Patient will receive at least 3 hours of therapy per day at least 5 days per week. 12. ELOS: 7-10 days.       13. Prognosis:  good  Delice Lesch, MD 05/24/2015

## 2015-05-25 NOTE — Care Management Important Message (Signed)
Important Message  Patient Details  Name: [name] MRN: [id number] Date of Birth: [birth year]   Medicare Important Message Given:  Yes    Shamyra Farias P Sencere Symonette 05/25/2015, 2:48 PM

## 2015-05-25 NOTE — Interval H&P Note (Signed)
[name] was admitted today to Inpatient Rehabilitation with the diagnosis of left paracentral-midbrain and posterior left paracentral pontine infarct.  The patient's history has been reviewed, patient examined, and there is no change in status.  Patient continues to be appropriate for intensive inpatient rehabilitation.  I have reviewed the patient's chart and labs.  Questions were answered to the patient's satisfaction. The PAPE has been reviewed and assessment remains appropriate.  Nikkol Pai Karis Juba 05/25/2015, 8:29 PM

## 2015-05-25 NOTE — Care Management Note (Signed)
Case Management Note  Patient Details  Name: [name] MRN: [id number] Date of Birth: 02-Jun-1960  Subjective/Objective:                    Action/Plan: Plan is for patient to discharge to CIR today. No further needs per CM.   Expected Discharge Date:                  Expected Discharge Plan:     In-House Referral:     Discharge planning Services     Post Acute Care Choice:    Choice offered to:     DME Arranged:    DME Agency:     HH Arranged:    HH Agency:     Status of Service:  In process, will continue to follow  Medicare Important Message Given:    Date Medicare IM Given:    Medicare IM give by:    Date Additional Medicare IM Given:    Additional Medicare Important Message give by:     If discussed at Long Length of Stay Meetings, dates discussed:    Additional Comments:  Kermit Balo, RN 05/25/2015, 10:26 AM

## 2015-05-26 ENCOUNTER — Inpatient Hospital Stay (HOSPITAL_COMMUNITY): Payer: Medicare Other | Admitting: Occupational Therapy

## 2015-05-26 ENCOUNTER — Inpatient Hospital Stay (HOSPITAL_COMMUNITY): Payer: Medicare Other

## 2015-05-26 LAB — COMPREHENSIVE METABOLIC PANEL
ALBUMIN: 3.5 g/dL (ref 3.5–5.0)
ALT: 27 U/L (ref 14–54)
AST: 35 U/L (ref 15–41)
Alkaline Phosphatase: 61 U/L (ref 38–126)
Anion gap: 12 (ref 5–15)
BUN: 19 mg/dL (ref 6–20)
CHLORIDE: 101 mmol/L (ref 101–111)
CO2: 25 mmol/L (ref 22–32)
CREATININE: 1.12 mg/dL — AB (ref 0.44–1.00)
Calcium: 9.4 mg/dL (ref 8.9–10.3)
GFR calc non Af Amer: 54 mL/min — ABNORMAL LOW (ref >60)
GLUCOSE: 254 mg/dL — AB (ref 65–99)
Potassium: 4.3 mmol/L (ref 3.5–5.1)
SODIUM: 138 mmol/L (ref 135–145)
Total Bilirubin: 0.8 mg/dL (ref 0.3–1.2)
Total Protein: 6.7 g/dL (ref 6.5–8.1)

## 2015-05-26 LAB — CBC WITH DIFFERENTIAL/PLATELET
BASOS ABS: 0 10*3/uL (ref 0.0–0.1)
BASOS PCT: 1 %
EOS ABS: 0.4 10*3/uL (ref 0.0–0.7)
EOS PCT: 7 %
HCT: 43.9 % (ref 36.0–46.0)
Hemoglobin: 15.3 g/dL — ABNORMAL HIGH (ref 12.0–15.0)
LYMPHS ABS: 2.4 10*3/uL (ref 0.7–4.0)
Lymphocytes Relative: 39 %
MCH: 30.8 pg (ref 26.0–34.0)
MCHC: 34.9 g/dL (ref 30.0–36.0)
MCV: 88.5 fL (ref 78.0–100.0)
Monocytes Absolute: 0.5 10*3/uL (ref 0.1–1.0)
Monocytes Relative: 9 %
NEUTROS PCT: 44 %
Neutro Abs: 2.8 10*3/uL (ref 1.7–7.7)
PLATELETS: 205 10*3/uL (ref 150–400)
RBC: 4.96 MIL/uL (ref 3.87–5.11)
RDW: 12.6 % (ref 11.5–15.5)
WBC: 6.1 10*3/uL (ref 4.0–10.5)

## 2015-05-26 LAB — GLUCOSE, CAPILLARY: Glucose-Capillary: 219 mg/dL — ABNORMAL HIGH (ref 65–99)

## 2015-05-26 MED ORDER — INSULIN ASPART 100 UNIT/ML ~~LOC~~ SOLN
6.0000 [IU] | Freq: Three times a day (TID) | SUBCUTANEOUS | Status: DC
Start: 2015-05-26 — End: 2015-05-28
  Administered 2015-05-26 – 2015-05-28 (×4): 6 [IU] via SUBCUTANEOUS

## 2015-05-26 MED ORDER — INSULIN DETEMIR 100 UNIT/ML ~~LOC~~ SOLN
12.0000 [IU] | Freq: Every day | SUBCUTANEOUS | Status: DC
  Administered 2015-05-27 – 2015-05-28 (×2): 12 [IU] via SUBCUTANEOUS
  Filled 2015-05-26 (×2): qty 0.12

## 2015-05-26 MED ORDER — INSULIN DETEMIR 100 UNIT/ML ~~LOC~~ SOLN
14.0000 [IU] | Freq: Every day | SUBCUTANEOUS | Status: DC
  Administered 2015-05-26: 14 [IU] via SUBCUTANEOUS
  Filled 2015-05-26 (×2): qty 0.14

## 2015-05-26 NOTE — Significant Event (Signed)
Hypoglycemic Event  CBG: 44  Treatment: 15 GM carbohydrate snack  Symptoms: None  Follow-up CBG: Time:1751 CBG Result:169  Possible Reasons for Event: Unknown  Comments/MD notified:Patient received 2 orange juices and a dinner tray. Patient displays no signs or symptom. Continue with plan of care.    Marlyne Beards, Suzi Roots

## 2015-05-26 NOTE — Progress Notes (Signed)
Social Work  Social Work Assessment and Plan  Patient Details  Name: [name] MRN: [id number] Date of Birth: [birth year]  Today's Date: 05/26/2015  Problem List:  Patient Active Problem List   Diagnosis Date Noted  . 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 Coronary artery disease involving native coronary artery of native heart without angina pectoris   . Chronic systolic congestive heart failure (HCC)   . Anemia, iron deficiency   . HLD (hyperlipidemia)   . Cerebrovascular accident (CVA) due to thrombosis of basilar artery (HCC)   . Diplopia   . 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 DM type 2 with diabetic peripheral neuropathy (HCC)   . History of CVA with residual deficit   . Benign essential HTN   . Hemiparesis affecting dominant side as late effect of stroke (HCC)   . DM type 2, goal A1c below 7 05/22/2015  . Heme + stool 05/04/2014  . DKA (diabetic ketoacidoses) (HCC) 03/23/2013  . Elevated troponin 03/23/2013  . Type I (juvenile type) diabetes mellitus with ophthalmic manifestations, uncontrolled 02/17/2013  . Depression 02/17/2013  . Altered mental status 02/14/2013    Class: Acute  . Diabetic ketoacidosis (HCC) 02/14/2013    Class: Acute  . Hypercalcemia 02/14/2013    Class: Acute  . Dehydration 02/14/2013    Class: Acute  . Acute renal insufficiency 02/14/2013    Class: Acute  . Fatigue 05/04/2012  . OSA (obstructive sleep apnea) 07/03/2011  . UTI (lower urinary tract infection) 03/21/2011  . Preseptal cellulitis 11/21/2010  . CVA (cerebral infarction) 10/17/2010  . FATIGUE 07/10/2010  . PERIMENOPAUSAL STATUS 07/10/2010  . DIABETES MELLITUS, TYPE I 04/25/2010  . DIABETIC  RETINOPATHY 04/25/2010  . Coronary atherosclerosis of native coronary artery 04/25/2010  . ISCHEMIC CARDIOMYOPATHY 04/25/2010  . Chronic systolic heart failure (HCC) 04/25/2010  . ORTHOSTATIC HYPOTENSION 04/25/2010  . Hyperlipidemia 08/30/2008   . ANEMIA-IRON DEFICIENCY 08/30/2008  . HYPERTENSION 08/30/2008   Past Medical History:  Past Medical History  Diagnosis Date  . Hypertension   . Vitamin D deficiency   . Coronary artery disease     a. (57/11) NSTEMI, in the setting of DKA, s/p Cath--LAD totalled in the midportion, diag 70% sten (small 2-mm ), LCx nonobst, OM1 40%, OM2 30%, RCA- no dz. chronic calcified LAD --> no intervention.  b. (06/2010) NSTEMI s/p cath showing same findings   . Ischemic cardiomyopathy   . Orthostatic hypotension     a. sycopal episode in 57/11   . Diabetes mellitus     a. Insulin dependant Type I   . Hyperlipidemia   . Diabetic nephropathy (HCC)   . Diabetic retinopathy   . Gastroparesis   . Depression   . Systolic heart failure     a. (57/2014) EF: 40% Mod hypokin in mid-dist-ant-sept myocard--> psuedonml L vent fill pattern, Grd 2 diast dyscfxn  . Iron deficiency anemia   . Hx MRSA infection   . Osteopenia   . Esophagitis   . NSTEMI (non-ST elevated myocardial infarction) (HCC)     a. (03/2010) NSTEMI during admission for DKA  b.  . Stroke [facility])     a. (2012) residual R hand/arm weakness  b. while on ASA and plavix  . Diabetic neuropathy [facility])    Past Surgical History:  Past Surgical History  Procedure Laterality Date  . Appendectomy  1980  . Tubal ligation  1987  . Cesarean section  1984  . Refractive surgery  x3   Social History:  reports that she has never smoked. She has never used smokeless tobacco. She reports that she does not drink alcohol or use illicit drugs.  Family / Support Systems Marital Status: Single Patient Roles: Parent, Volunteer Children: 62 yo son whom is involved Other Supports: Daisy Joyce-Mom  [phone] Anticipated Caregiver: Mom Ability/Limitations of Caregiver: Mom recently had CABG and is recovering from this. She can only provide supervision level, she also is 57 yo  Caregiver Availability: 24/7 Family Dynamics: Pt is close with Mom and her  sister is close by also. She and Mom depend upon each other. Prior to her hospitalization she was helping her Mom after her CABG surgery. She reports they get along well and her sister stops by often. She also has support form her church members.  Social History Preferred language: English Religion: Baptist Cultural Background: No issues Education: [employer] Read: Yes Write: Yes Employment Status: Disabled Fish farm manager Issues: No issues Guardian/Conservator: None-according to MD pt is capable of making her own decisions while here. Will make sure her Mom is aware also due to pt reports memory issues from previous health issues.   Abuse/Neglect Physical Abuse: Denies Verbal Abuse: Denies Sexual Abuse: Denies Exploitation of patient/patient's resources: Denies Self-Neglect: Denies  Emotional Status Pt's affect, behavior adn adjustment status: Pt is motivated to improve she has never had any rehab before and doesn't know what to expect here. She has enjoyed her am therapies but is tired from them. She wants to get back to the level of functioning she was before this, walking with her walker in the home. She is active in her church and wants to get back to this also. Recent Psychosocial Issues: Otehr health issues-past history of CVA and heart issues along with her type 1 diabetes Pyschiatric History: history of depression takes cymbalta and celexa for this and finds them helpful. She may benefit from seeing neuro-psych while here due to her many health issues and young age, will make referral while here. Substance Abuse History: No issues  Patient / Family Perceptions, Expectations & Goals Pt/Family understanding of illness & functional limitations: Pt and Mom can explain her stroke and deficits. She really hopes her double vision goes away, this does bother her. She and Mom talk with the MD's and feel their questions are being answered. Pt is not afraid to ask as long as  they give her time. Premorbid pt/family roles/activities: Mom, Daughter, Sister, Retiree, [employer], etc Anticipated changes in roles/activities/participation: plans to resume at discharge Pt/family expectations/goals: Pt states: " I want to get back to walking with a walker by myself. My Mom can't help me she has enough to do and recover herself."  Mom states: " I can be there and help with cooking but not any physical care."  [employer]: Other (Comment) ([facility] in the past) Premorbid Home Care/DME Agencies: None Transportation available at discharge: Mom and family Resource referrals recommended: Neuropsychology, Support group (specify)  Discharge Planning Living Arrangements: Parent Support Systems: Children, Counselling psychologist, Other relatives, Friends/neighbors, Psychologist, clinical community Type of Residence: Private residence Insurance Resources: Media planner (specify) Investment banker, operational) Financial Resources: Family Support, SSD Financial Screen Referred: No Living Expenses: Lives with family Money Management: Family Does the patient have any problems obtaining your medications?: No Home Management: She and Mom help one another-pt cleans some and Mom does the cooking Patient/Family Preliminary Plans: Return home with Mom who can provide supervision level only she is still recovering  from a CABG surgery. Pt will need to be mod/i to supervision levle to return home. Her sister does check on both of them but is not there with them.  Social Work Anticipated Follow Up Needs: HH/OP, Support Group  Clinical Impression Pleasant female who is motivated to improve and return to her prior level of functioning. She wants to get home to be with her Mom and try to help but realizes she needs to be doing better and would just be another worry  For her Mom. She has recovered in the past and is hopeful she will again, her double vision does worry her and she would like for it  to go away. Feel would benefit from neuro-psych seeing with her mulitple medical issues and young Age, will make referral while here. Work on discharge plans also.  Lucy Chris 05/26/2015, 11:38 AM

## 2015-05-26 NOTE — Progress Notes (Signed)
Patient information reviewed and entered into eRehab System by Becky Mikhail Hallenbeck, covering PPS coordinator. Information including medical coding and functional independence measure will be reviewed and updated through discharge.  Per nursing, patient was given "Data Collection Information Summary for Patients in Inpatient Rehabilitation Facilities with attached Privacy Act Statement Health Care Records" upon admission.     

## 2015-05-26 NOTE — Care Management Note (Signed)
Inpatient Rehabilitation Center Individual Statement of Services  Patient Name:  [name]  Date:  05/26/2015  Welcome to the Inpatient Rehabilitation Center.  Our goal is to provide you with an individualized program based on your diagnosis and situation, designed to meet your specific needs.  With this comprehensive rehabilitation program, you will be expected to participate in at least 3 hours of rehabilitation therapies Monday-Friday, with modified therapy programming on the weekends.  Your rehabilitation program will include the following services:  Physical Therapy (PT), Occupational Therapy (OT), Speech Therapy (ST), 24 hour per day rehabilitation nursing, Therapeutic Recreaction (TR), Case Management (Social Worker), Rehabilitation Medicine, Nutrition Services and Pharmacy Services  Weekly team conferences will be held on Wednesday to discuss your progress.  Your Social Worker will talk with you frequently to get your input and to update you on team discussions.  Team conferences with you and your family in attendance may also be held.  Expected length of stay: 7-10 days Overall anticipated outcome: supervision set up  Depending on your progress and recovery, your program may change. Your Social Worker will coordinate services and will keep you informed of any changes. Your Social Worker's name and contact numbers are listed  below.  The following services may also be recommended but are not provided by the Inpatient Rehabilitation Center:    Home Health Rehabiltiation Services  Outpatient Rehabilitation Services    Arrangements will be made to provide these services after discharge if needed.  Arrangements include referral to agencies that provide these services.  Your insurance has been verified to be:  UHC-Medicare Your primary doctor is:  Adrian Prince  Pertinent information will be shared with your doctor and your insurance company.  Social Worker:  Dossie Der, SW  [phone] or ([phone]  Information discussed with and copy given to patient by: Lucy Chris, 05/26/2015, 11:19 AM

## 2015-05-26 NOTE — Progress Notes (Signed)
55 y.o. 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 Patient lives with parent. One level home with ramped entrance. Mother is 94 years old and limited assistance. Patient uses a rolling walker prior to admission. Presented 05/22/2015 with double vision decrease in balance and recent fall while using a walker. MRI of the brain showed acute small nonhemorrhagic infarct left paracentral midbrain and posterior left paracentral pons. Remote small infarcts scattered throughout the centrum semiovale ovale, remote thalamic infarct. Chronic small vessel disease changes. CT angiogram head showed no major intracranial arterial occlusion. Echocardiogram with ejection fraction of 40%. Systolic function moderately reduced. No cardiac source of emboli identified..   Subjective/Complaints: Vertical diplopia  ROS- no N/V, diarrhea, contipation, no CP or SOB Objective: Vital Signs: Blood pressure 135/66, pulse 79, temperature 98.1 F (36.7 C), temperature source Oral, resp. rate 16, height 5' 4"  (1.626 m), weight 69.355 kg (152 lb 14.4 oz), last menstrual period 03/13/2011, SpO2 98 %. No results found. Results for orders placed or performed during the hospital encounter of 05/25/15 (from the past 72 hour(s))  CBC     Status: None   Collection Time: 05/25/15  6:27 PM  Result Value Ref Range   WBC 6.5 4.0 - 10.5 K/uL   RBC 4.69 3.87 - 5.11 MIL/uL   Hemoglobin 14.5 12.0 - 15.0 g/dL   HCT 42.5 36.0 - 46.0 %   MCV 90.6 78.0 - 100.0 fL   MCH 30.9 26.0 - 34.0 pg   MCHC 34.1 30.0 - 36.0 g/dL   RDW 12.7 11.5 - 15.5 %   Platelets 211 150 - 400 K/uL  Creatinine, serum     Status: Abnormal   Collection Time: 05/25/15  6:27 PM  Result Value Ref Range   Creatinine, Ser 1.30 (H) 0.44 - 1.00 mg/dL   GFR calc non Af Amer 45 (L) >60 mL/min    GFR calc Af Amer 52 (L) >60 mL/min    Comment: (NOTE) The eGFR has been calculated using the CKD EPI equation. This calculation has not been validated in all clinical situations. eGFR's persistently <60 mL/min signify possible Chronic Kidney Disease.   Glucose, capillary     Status: Abnormal   Collection Time: 05/25/15  9:30 PM  Result Value Ref Range   Glucose-Capillary 367 (H) 65 - 99 mg/dL  CBC WITH DIFFERENTIAL     Status: Abnormal   Collection Time: 05/26/15  6:45 AM  Result Value Ref Range   WBC 6.1 4.0 - 10.5 K/uL   RBC 4.96 3.87 - 5.11 MIL/uL   Hemoglobin 15.3 (H) 12.0 - 15.0 g/dL   HCT 43.9 36.0 - 46.0 %   MCV 88.5 78.0 - 100.0 fL   MCH 30.8 26.0 - 34.0 pg   MCHC 34.9 30.0 - 36.0 g/dL   RDW 12.6 11.5 - 15.5 %   Platelets 205 150 - 400 K/uL   Neutrophils Relative % 44 %   Neutro Abs 2.8 1.7 - 7.7 K/uL   Lymphocytes Relative 39 %   Lymphs Abs 2.4 0.7 - 4.0 K/uL   Monocytes Relative 9 %   Monocytes Absolute 0.5 0.1 - 1.0 K/uL   Eosinophils Relative 7 %   Eosinophils Absolute 0.4 0.0 - 0.7 K/uL   Basophils Relative 1 %   Basophils Absolute 0.0 0.0 - 0.1 K/uL  Glucose, capillary     Status: Abnormal  Collection Time: 05/26/15  6:47 AM  Result Value Ref Range   Glucose-Capillary 219 (H) 65 - 99 mg/dL      General: No acute distress Mood and affect are appropriate Eyes- non injected, keeps Left eye closed  Heart: Regular rate and rhythm no rubs murmurs or extra sounds Lungs: Clear to auscultation, breathing unlabored, no rales or wheezes Abdomen: Positive bowel sounds, soft nontender to palpation, nondistended Extremities: No clubbing, cyanosis, or edema Skin: No evidence of breakdown, no evidence of rash Neurologic: Cranial nerve IV palsy, motor strength is 5/5 in left  deltoid, bicep, tricep, grip,Bilateral hip flexor, knee extensors, ankle dorsiflexor and plantar flexor, 4/5 RUE with ataxia Sensory exam normal sensation to light touch and proprioception in  bilateral upper and lower extremities Cerebellar exam normal finger to nose to finger as well as heel to shin in bilateral upper and lower extremities Musculoskeletal: Full range of motion in all 4 extremities. No joint swelling   Assessment/Plan: 1. Functional deficits secondary to Left paracentral midbrain and pontine infarcts which require 3+ hours per day of interdisciplinary therapy in a comprehensive inpatient rehab setting. Physiatrist is providing close team supervision and 24 hour management of active medical problems listed below. Physiatrist and rehab team continue to assess barriers to discharge/monitor patient progress toward functional and medical goals. FIM:       Function - Toileting Toileting steps completed by patient: Adjust clothing prior to toileting, Performs perineal hygiene, Adjust clothing after toileting Toileting Assistive Devices: Toilet aid Assist level: Set up/obtain supplies, Touching or steadying assistance (Pt.75%)  Function - Toilet Transfers Toilet transfer assistive device: Bedside commode Assist level to toilet: Set up only Assist level from toilet: Moderate assist (Pt 50 - 74%/lift or lower) Assist level to bedside commode (at bedside): Moderate assist (Pt 50 - 74%/lift or lower)        Function - Comprehension Comprehension: Auditory Comprehension assist level: Follows complex conversation/direction with no assist  Function - Expression Expression: Verbal Expression assist level: Expresses complex ideas: With no assist  Function - Social Interaction Social Interaction assist level: Interacts appropriately with others - No medications needed.  Function - Problem Solving Problem solving assist level: Solves complex problems: Recognizes & self-corrects  Function - Memory Memory assist level: Complete Independence: No helper  Medical Problem List and Plan: 1.  Diplopia, gait deficits secondary to left paracentral-midbrain and posterior  left paracentral pontine infarct- initiate therapy program 2.  DVT Prophylaxis/Anticoagulation: Subcutaneous Lovenox. Monitor platelet counts and any signs of bleeding 3. Pain Management: Cymbalta 30 mg daily 4. Mood: Celexa 20 mg daily. Provide emotional support 5. Neuropsych: This patient is capable of making decisions on her own behalf. 6. Skin/Wound Care: Routine skin checks 7. Fluids/Electrolytes/Nutrition: Routine I&O with follow up labs 8. Hypertension. Coreg 6.25 mg twice a day, Lanoxin 0.0625 mg every other day. Monitor with increased mobility 9. Chronic systolic congestive heart failure with ischemic cardiomyopathy/NSTEMI. Lasix 20 mg daily. Monitor for any signs of fluid overload 10. Diabetes mellitus and peripheral neuropathy. Hemoglobin A1c 11. Levemir 12 units daily will bump to 14 U Increase Novalog meal coverage Check blood sugars before meals and at bedtime. Diabetic teaching 11. Iron deficiency anemia. Resolved on Ferrous sulfate 650 mg daily. 12. Hyperlipidemia. Crestor   LOS (Days) 1 A FACE TO FACE EVALUATION WAS PERFORMED  Magdalyn Arenivas E 05/26/2015, 7:40 AM

## 2015-05-26 NOTE — Progress Notes (Signed)
Occupational Therapy Session Note  Patient Details  Name: [name] MRN: [id number] Date of Birth: Sep 10, 1960  Today's Date: 05/26/2015 OT Individual Time: 1300-1400 OT Individual Time Calculation (min): 60 min    Skilled Therapeutic Interventions/Progress Updates:   During session rolled pt to the therapy gym in her wheelchair and had her transfer to the therapy mat with min assist sit to stand.  Worked on further visual testing secondary to pt demonstrating history of diplopia.  Pt with double vision present in areas in both the left and right quadrants as well as some spots of monocular vision.  Provided safety glasses for correction of diplopia instead of current method of using an alternating.  Had pt ambulate with hand held assist while wearing glasses and therapist providing mod assist.  She reports liking glasses better than eye patch as well.  Issued RW for use with toileting and mobility in the room with nursing staff.  She was able to complete a toilet transfer with min assist using the RW during session as well.  Rolled pt back to room at conclusion of session where she transferred from the wheelchair back to the bed with min assist.  Supervision for sit to supine.  Pt left with call button in lap and bed alarm in place.    Therapy Documentation Precautions:  Precautions Precautions: Fall Restrictions Weight Bearing Restrictions: No   Pain:   See function section of chart  ADL: See Function Navigator for Current Functional Status.   Therapy/Group: Individual Therapy  Jarrel Knoke OTR/L 05/26/2015, 5:15 PM

## 2015-05-26 NOTE — Significant Event (Signed)
Hypoglycemic Event  CBG: 58  Treatment: 15 GM carbohydrate snack  Symptoms: None  Follow-up CBG: Time:1701 CBG Result:44  Possible Reasons for Event: Unknown  Comments/MD notified:Pam Love, PA-C notified, received orders to change daily Levemir from 14 units to 12 units. Patient received 15 gram carb snack. Will recheck in 15 min. Continue with plan of care.     Marlyne Beards, Suzi Roots

## 2015-05-26 NOTE — Evaluation (Signed)
Physical Therapy Assessment and Plan  Patient Details  Name: [name] MRN: [id number] Date of Birth: 10/23/1960  PT Diagnosis: Abnormality of gait, Cognitive deficits, Contracture of joint: bil ankles, Hemiparesis dominant and Impaired sensation Rehab Potential: Good ELOS: 7-10   Today's Date: 05/26/2015 PT Individual Time:  -       Problem List:  Patient Active Problem List   Diagnosis Date Noted  . 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 Coronary artery disease involving native coronary artery of native heart without angina pectoris   . Chronic systolic congestive heart failure (HCC)   . Anemia, iron deficiency   . HLD (hyperlipidemia)   . Cerebrovascular accident (CVA) due to thrombosis of basilar artery (HCC)   . Diplopia   . 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 DM type 2 with diabetic peripheral neuropathy (HCC)   . History of CVA with residual deficit   . Benign essential HTN   . Hemiparesis affecting dominant side as late effect of stroke (HCC)   . DM type 2, goal A1c below 7 05/22/2015  . Heme + stool 05/04/2014  . DKA (diabetic ketoacidoses) (HCC) 03/23/2013  . Elevated troponin 03/23/2013  . Type I (juvenile type) diabetes mellitus with ophthalmic manifestations, uncontrolled 02/17/2013  . Depression 02/17/2013  . Altered mental status 02/14/2013    Class: Acute  . Diabetic ketoacidosis (HCC) 02/14/2013    Class: Acute  . Hypercalcemia 02/14/2013    Class: Acute  . Dehydration 02/14/2013    Class: Acute  . Acute renal insufficiency 02/14/2013    Class: Acute  . Fatigue 05/04/2012  . OSA (obstructive sleep apnea) 07/03/2011  . UTI (lower urinary tract infection) 03/21/2011  . Preseptal cellulitis 11/21/2010  . CVA (cerebral infarction) 10/17/2010  . FATIGUE 07/10/2010  . PERIMENOPAUSAL STATUS 07/10/2010  . DIABETES MELLITUS, TYPE I 04/25/2010  . DIABETIC  RETINOPATHY 04/25/2010  . Coronary atherosclerosis of  native coronary artery 04/25/2010  . ISCHEMIC CARDIOMYOPATHY 04/25/2010  . Chronic systolic heart failure (HCC) 04/25/2010  . ORTHOSTATIC HYPOTENSION 04/25/2010  . Hyperlipidemia 08/30/2008  . ANEMIA-IRON DEFICIENCY 08/30/2008  . HYPERTENSION 08/30/2008    Past Medical History:  Past Medical History  Diagnosis Date  . Hypertension   . Vitamin D deficiency   . Coronary artery disease     a. (12/11) NSTEMI, in the setting of DKA, s/p Cath--LAD totalled in the midportion, diag 70% sten (small 2-mm ), LCx nonobst, OM1 40%, OM2 30%, RCA- no dz. chronic calcified LAD --> no intervention.  b. (06/2010) NSTEMI s/p cath showing same findings   . Ischemic cardiomyopathy   . Orthostatic hypotension     a. sycopal episode in 12/11   . Diabetes mellitus     a. Insulin dependant Type I   . Hyperlipidemia   . Diabetic nephropathy (HCC)   . Diabetic retinopathy   . Gastroparesis   . Depression   . Systolic heart failure     a. (08/2012) EF: 40% Mod hypokin in mid-dist-ant-sept myocard--> psuedonml L vent fill pattern, Grd 2 diast dyscfxn  . Iron deficiency anemia   . Hx MRSA infection   . Osteopenia   . Esophagitis   . NSTEMI (non-ST elevated myocardial infarction) (HCC)     a. (03/2010) NSTEMI during admission for DKA  b.  . Stroke (HCC)     a. (2012) residual R hand/arm weakness  b. while on ASA and plavix  . Diabetic neuropathy (HCC)      Past Surgical History:  Past Surgical History  Procedure Laterality Date  . Appendectomy  1980  . Tubal ligation  1987  . Cesarean section  1984  . Refractive surgery      x3    Assessment & Plan Clinical Impression: [name] is a 55 y.o. 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 Patient lives with parent. One level home with ramped entrance. Mother is 80 years  old and limited assistance. Patient uses a rolling walker prior to admission. Presented 05/22/2015 with double vision decrease in balance and recent fall while using a walker. MRI of the brain showed acute small nonhemorrhagic infarct left paracentral midbrain and posterior left paracentral pons. Remote small infarcts scattered throughout the centrum semiovale ovale, remote thalamic infarct. Chronic small vessel disease changes. CT angiogram head showed no major intracranial arterial occlusion. Echocardiogram with ejection fraction of 40%. Systolic function moderately reduced. No cardiac source of emboli identified..  Patient transferred to CIR on 05/25/2015 .   Patient currently requires mod with mobility secondary to muscle joint tightness, decreased cardiorespiratoy endurance, impaired timing and sequencing, abnormal tone, unbalanced muscle activation and decreased coordination and decreased problem solving, decreased memory and delayed processing.  Prior to hospitalization, patient was modified independent  with mobility and lived with   in a House home.  Home access is  Ramped entrance.  Patient will benefit from skilled PT intervention to maximize safe functional mobility, minimize fall risk and decrease caregiver burden for planned discharge home with 24 hour supervision.  Anticipate patient will benefit from follow up OP at discharge.  PT - End of Session Activity Tolerance: Tolerates 10 - 20 min activity with multiple rests Endurance Deficit: Yes Endurance Deficit Description: pt fatigued after steps; diaphoretic throughout session PT Assessment Rehab Potential (ACUTE/IP ONLY): Good Barriers to Discharge: Decreased caregiver support Barriers to Discharge Comments: 80 year old mother recently had CABG PT Patient demonstrates impairments in the following area(s): Balance;Endurance;Motor;Safety;Sensory PT Transfers Functional Problem(s): Bed Mobility;Bed to Chair;Car;Furniture PT Locomotion  Functional Problem(s): Ambulation;Wheelchair Mobility;Stairs PT Plan PT Intensity: Minimum of 1-2 x/day ,45 to 90 minutes PT Frequency: 5 out of 7 days PT Duration Estimated Length of Stay: 7-10 PT Treatment/Interventions: Ambulation/gait training;Balance/vestibular training;Cognitive remediation/compensation;Community reintegration;Discharge planning;Neuromuscular re-education;Functional mobility training;DME/adaptive equipment instruction;Pain management;Patient/family education;Psychosocial support;Splinting/orthotics;UE/LE Coordination activities;UE/LE Strength taining/ROM;Therapeutic Exercise;Therapeutic Activities;Wheelchair propulsion/positioning;Stair training;Visual/perceptual remediation/compensation;Functional electrical stimulation PT Transfers Anticipated Outcome(s): S overall PT Locomotion Anticipated Outcome(s): S gait x 200' controlled and community enb, 50' home env; up/down 12 steps and ramp/curb with supervision PT Recommendation Follow Up Recommendations: Outpatient PT Patient destination: Home Equipment Recommended: To be determined Equipment Details: pt owns RW and 4 pt cane  Skilled Therapeutic Intervention Rehab program explained to pt.  Pt noted to have memory problems when explaining architectural barriers (or not) of her church, where she goes weekly.     PT Evaluation Precautions/Restrictions Precautions Precautions: Fall Restrictions Weight Bearing Restrictions: No General   Vital SignsTherapy Vitals Temp: 98.1 F (36.7 C) Temp Source: Oral Pulse Rate: 67 Resp: 16 BP: 121/67 mmHg Patient Position (if appropriate): Sitting Oxygen Therapy SpO2: 98 % O2 Device: Not Delivered Pain Pain Assessment Pain Assessment: No/denies pain Home Living/Prior Functioning Home Living Available Help at Discharge: Family;Available 24 hours/day Type of Home: House Home Access: Ramped entrance Home Layout: One level Bathroom Shower/Tub: Tub/shower  unit;Curtain Bathroom Toilet: Standard Bathroom Accessibility: Yes   Additional Comments: pt's mother is 80 yo, is independent and does the driving, though she recently had a CABG. Pt reports her mother drives and does all the cooking while she helps with cleaning of home. Prior Function Level of Independence: Requires assistive device for independence;Needs assistance with homemaking Driving: No Vocation: On disability Leisure: Hobbies-yes (Comment) (backyard bird feeders, TV) Comments: Pt reports ambulation with quad cane or RW at baseline, shower seat for bathing, R sided weakness Vision/Perception     Cognition Overall Cognitive Status: History of cognitive impairments - at baseline Arousal/Alertness: Awake/alert Orientation Level: Oriented X4 Attention: Selective Selective Attention: Appears intact Memory: Impaired Memory Impairment: Retrieval deficit Sensation Sensation Light Touch: Impaired Detail Light Touch Impaired Details: Absent RLE;Absent LLE (R toes and part of forefoot; L great toe and part of forefoot) Proprioception: Impaired Detail Proprioception Impaired Details: Absent RLE;Absent LLE (bil great toes ; impaired bil anles) Coordination Heel Shin Test: slightly decreased accuracy bil Motor     Mobility   Locomotion  Ambulation Ambulation: Yes Ambulation/Gait Assistance: 3: Mod assist Ambulation Distance (Feet): 150 Feet Assistive device: None Gait Gait: Yes Gait Pattern: Impaired Gait Pattern: Decreased trunk rotation;Left steppage;Right steppage;Decreased stride length;Decreased dorsiflexion - right;Decreased dorsiflexion - left;Decreased step length - right;Step-through pattern;Decreased step length - left Stairs / Additional Locomotion Stairs: Yes Stairs Assistance: 4: Min assist Stairs Assistance Details: Verbal cues for technique Stair Management Technique: Two rails;Step to pattern;Alternating pattern;Forwards (ascended all and descended 3"  alternating; descended 6" step to) Number of Stairs: 12 Height of Stairs: 6 (and 3") Architect: Yes Wheelchair Assistance: 5: Investment banker, operational Details: Verbal cues for Information systems manager: Both upper extremities Wheelchair Parts Management: Needs assistance Distance: 150  Trunk/Postural Assessment  Cervical Assessment Cervical Assessment: Within Functional Limits Thoracic Assessment Thoracic Assessment: Within Functional Limits Lumbar Assessment Lumbar Assessment: Within Functional Limits Postural Control Postural Control: Within Functional Limits  Balance Balance Balance Assessed: Yes Static Sitting Balance Static Sitting - Level of Assistance: 5: Stand by assistance Dynamic Sitting Balance Dynamic Sitting - Level of Assistance: 5: Stand by assistance Static Standing Balance Static Standing - Level of Assistance: 4: Min assist Extremity Assessment      RLE Assessment RLE Assessment: Exceptions to [facility] RLE PROM (degrees) RLE Overall PROM Comments: charcot type deformity; ankle DF 0 degrees RLE Strength RLE Overall Strength Comments: grossly 4/5 to 4+/5  throughout; done in sitting LLE Assessment LLE Assessment: Exceptions to WFL LLE PROM (degrees) LLE Overall PROM Comments: Charcot type deformity ; ankle DF 0 degrees LLE Strength LLE Overall Strength Comments: grossly 5/5 hip flex, abd; knee flex/ext; 4/5 ankle DF and hip adductors; all in sitting   See Function Navigator for Current Functional Status.   Refer to Care Plan for Long Term Goals  Recommendations for other services: None  Discharge Criteria: Patient will be discharged from PT if patient refuses treatment 3 consecutive times without medical reason, if treatment goals not met, if there is a change in medical status, if patient makes no progress towards goals or if patient is discharged from hospital.  The above assessment, treatment  plan, treatment alternatives and goals were discussed and mutually agreed upon: by patient  Dominica Kent 05/26/2015, 9:24 AM

## 2015-05-26 NOTE — Evaluation (Signed)
Occupational Therapy Assessment and Plan  Patient Details  Name: [name] MRN: [id number] Date of Birth: 08/30/1960  OT Diagnosis: abnormal posture, hemiplegia affecting dominant side and muscle weakness (generalized) Rehab Potential: Rehab Potential (ACUTE ONLY): Good ELOS: 10-12 days   Today's Date: 05/26/2015 OT Individual Time: 1002-1100 OT Individual Time Calculation (min): 58 min     Problem List:  Patient Active Problem List   Diagnosis Date Noted  . Left pontine cerebrovascular accident ([place]) 05/25/2015  . Coronary artery disease involving native coronary artery of native heart without angina pectoris   . Chronic systolic congestive heart failure ([place])   . Anemia, iron deficiency   . HLD (hyperlipidemia)   . Cerebrovascular accident (CVA) due to thrombosis of basilar artery ([place])   . Diplopia   . 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 DM type 2 with diabetic peripheral neuropathy ([place])   . History of CVA with residual deficit   . Benign essential HTN   . Hemiparesis affecting dominant side as late effect of stroke ([place])   . DM type 2, goal A1c below 7 05/22/2015  . Heme + stool 05/04/2014  . DKA (diabetic ketoacidoses) ([place]) 03/23/2013  . Elevated troponin 03/23/2013  . Type I (juvenile type) diabetes mellitus with ophthalmic manifestations, uncontrolled 02/17/2013  . Depression 02/17/2013  . Altered mental status 02/14/2013    Class: Acute  . Diabetic ketoacidosis ([place]) 02/14/2013    Class: Acute  . Hypercalcemia 02/14/2013    Class: Acute  . Dehydration 02/14/2013    Class: Acute  . Acute renal insufficiency 02/14/2013    Class: Acute  . Fatigue 05/04/2012  . OSA (obstructive sleep apnea) 07/03/2011  . UTI (lower urinary tract infection) 03/21/2011  . Preseptal cellulitis 11/21/2010  . CVA (cerebral infarction) 10/17/2010  . FATIGUE 07/10/2010  . PERIMENOPAUSAL STATUS 07/10/2010  . DIABETES MELLITUS, TYPE I 04/25/2010  . DIABETIC   RETINOPATHY 04/25/2010  . Coronary atherosclerosis of native coronary artery 04/25/2010  . ISCHEMIC CARDIOMYOPATHY 04/25/2010  . Chronic systolic heart failure ([place]) 04/25/2010  . ORTHOSTATIC HYPOTENSION 04/25/2010  . Hyperlipidemia 08/30/2008  . ANEMIA-IRON DEFICIENCY 08/30/2008  . HYPERTENSION 08/30/2008    Past Medical History:  Past Medical History  Diagnosis Date  . Hypertension   . Vitamin D deficiency   . Coronary artery disease     a. (12/11) NSTEMI, in the setting of DKA, s/p Cath--LAD totalled in the midportion, diag 70% sten (small 2-mm ), LCx nonobst, OM1 40%, OM2 30%, RCA- no dz. chronic calcified LAD --> no intervention.  b. (06/2010) NSTEMI s/p cath showing same findings   . Ischemic cardiomyopathy   . Orthostatic hypotension     a. sycopal episode in 12/11   . Diabetes mellitus     a. Insulin dependant Type I   . Hyperlipidemia   . Diabetic nephropathy ([place])   . Diabetic retinopathy   . Gastroparesis   . Depression   . Systolic heart failure     a. (08/2012) EF: 40% Mod hypokin in mid-dist-ant-sept myocard--> psuedonml L vent fill pattern, Grd 2 diast dyscfxn  . Iron deficiency anemia   . Hx MRSA infection   . Osteopenia   . Esophagitis   . NSTEMI (non-ST elevated myocardial infarction) ([place])     a. (03/2010) NSTEMI during admission for DKA  b.  . Stroke [facility])     a. (2012) residual R hand/arm weakness  b. while on ASA and plavix  . Diabetic neuropathy (  [facility])    Past Surgical History:  Past Surgical History  Procedure Laterality Date  . Appendectomy  1980  . Tubal ligation  1987  . Cesarean section  1984  . Refractive surgery      x3    Assessment & Plan Clinical Impression: Patient is a 55 y.o. year old female with recent admission to the hospital on 05/22/2015 with double vision decrease in balance and recent fall while using a walker. MRI of the brain showed acute small nonhemorrhagic infarct left paracentral midbrain and posterior left paracentral  pons. Remote small infarcts scattered throughout the centrum semiovale ovale, remote thalamic infarct   Patient transferred to CIR on 05/25/2015 .    Patient currently requires min with basic self-care skills secondary to muscle weakness, impaired timing and sequencing and decreased coordination, diplopia and decreased sitting balance, decreased standing balance, hemiplegia and decreased balance strategies.  Prior to hospitalization, patient could complete ADLs with modified independent .  Patient will benefit from skilled intervention to decrease level of assist with basic self-care skills and increase independence with basic self-care skills prior to discharge home with care partner.  Anticipate patient will require intermittent supervision and follow up home health.  OT - End of Session Activity Tolerance: Tolerates 30+ min activity with multiple rests Endurance Deficit: Yes Endurance Deficit Description: Pt voiced fatigue after completion of ADL OT Assessment Rehab Potential (ACUTE ONLY): Good Barriers to Discharge: Decreased caregiver support Barriers to Discharge Comments: Lives with her mother who could only provide supervision secondary to recent heart surgery, no physical assist.  OT Patient demonstrates impairments in the following area(s): Balance;Cognition;Motor;Endurance OT Basic ADL's Functional Problem(s): Eating;Grooming;Bathing;Dressing;Toileting OT Advanced ADL's Functional Problem(s): Simple Meal Preparation;Light Housekeeping;Laundry OT Transfers Functional Problem(s): Toilet;Tub/Shower OT Additional Impairment(s): Fuctional Use of Upper Extremity OT Plan OT Intensity: Minimum of 1-2 x/day, 45 to 90 minutes OT Frequency: 5 out of 7 days OT Duration/Estimated Length of Stay: 10-12 days OT Treatment/Interventions: Balance/vestibular training;Cognitive remediation/compensation;Discharge planning;Patient/family education;Therapeutic Exercise;UE/LE Strength taining/ROM;UE/LE  Coordination activities;Therapeutic Activities;DME/adaptive equipment instruction;Community reintegration;Neuromuscular re-education;Self Care/advanced ADL retraining OT Self Feeding Anticipated Outcome(s): independent OT Basic Self-Care Anticipated Outcome(s): modified independent OT Toileting Anticipated Outcome(s): modified independent OT Bathroom Transfers Anticipated Outcome(s): modified independent OT Recommendation Patient destination: Home Follow Up Recommendations: Home health OT Equipment Recommended: 3 in 1 bedside comode;None recommended by OT   Skilled Therapeutic Intervention Pt completed self care re-training shower level this session.  Min assist for mobilization to the shower without an assistive device.  She was able to complete all clothing removal prior to shower with min guard assist and then proceeded to complete shower with min guard, using seat for balance.  Dressing sit to stand at the sink with min guard.  Pt utilized the RUE for grooming tasks during session.  Noted slight ataxia with functional use.  Pt still wearing eye patch secondary to diplopia.  Pt left in wheelchair at end of session.    OT Evaluation Precautions/Restrictions  Precautions Precautions: Fall Restrictions Weight Bearing Restrictions: No  Pain Pain Assessment Pain Assessment: No/denies pain Home Living/Prior Functioning Home Living Living Arrangements: Parent Available Help at Discharge: Family, Available 24 hours/day Type of Home: House Home Access: Ramped entrance Home Layout: One level Bathroom Shower/Tub: Tub/shower unit, Architectural technologist: Standard Bathroom Accessibility: Yes Additional Comments: pt's mother is 26 yo, is independent and does the driving, though she recently had a CABG. Pt reports her mother drives and does all the cooking while she helps with cleaning of home.  Prior Function Level of Independence: Independent with basic ADLs Driving: No Vocation: On  disability Leisure: Hobbies-yes (Comment) Comments: Pt reports ambulation with quad cane or RW at baseline, shower seat for bathing, R sided weakness ADL  See Function section of chart  Vision/Perception  Vision- History Baseline Vision/History: Wears glasses Wears Glasses: Reading only Patient Visual Report: Diplopia Vision- Assessment Vision Assessment?: Yes Eye Alignment: Within Functional Limits Ocular Range of Motion: Within Functional Limits Alignment/Gaze Preference: Head tilt (head tilt to the right) Tracking/Visual Pursuits: Decreased smoothness of horizontal tracking Convergence: Within functional limits Visual Fields: No apparent deficits Diplopia Assessment: Disappears with one eye closed Additional Comments: Diplopia present bilaterally but pt does exhbit some areas of fusion in the central superior and inferior region just left of midline.    Cognition Overall Cognitive Status: History of cognitive impairments - at baseline Arousal/Alertness: Awake/alert Orientation Level: Person Year: 2017 Month: February Day of Week: Correct Memory: Impaired Memory Impairment: Decreased recall of new information;Decreased short term memory Decreased Short Term Memory: Functional basic;Functional complex Immediate Memory Recall: Sock;Blue;Bed Memory Recall: Blue;Bed Memory Recall Blue: With Cue Memory Recall Bed: With Cue Attention: Selective Selective Attention: Appears intact Awareness: Appears intact Problem Solving: Appears intact Safety/Judgment: Appears intact Comments: Pt reports short term memory issues from previous CVA. Sensation Sensation Light Touch: Appears Intact (in RUE) Stereognosis: Impaired Detail Stereognosis Impaired Details: Impaired RUE Hot/Cold: Not tested Proprioception: Not tested Coordination Gross Motor Movements are Fluid and Coordinated: No Fine Motor Movements are Fluid and Coordinated: No Coordination and Movement Description: Pt with  ataxic movement and dysmetria in the RUE during functional use from previous CVA. Motor  Motor Motor: Hemiplegia Motor - Skilled Clinical Observations: right UE and LE mild hemiparesis Mobility  Transfers Transfers: Sit to Stand Sit to Stand: 4: Min assist;With upper extremity assist;With armrests;From chair/3-in-1 Sit to Stand Details: Verbal cues for sequencing;Manual facilitation for weight bearing  Trunk/Postural Assessment  Cervical Assessment Cervical Assessment: Exceptions to [facility] (head tilt to the right) Thoracic Assessment Thoracic Assessment: Exceptions to [facility] (slight thoracic rounding present) Lumbar Assessment Lumbar Assessment: Within Functional Limits Postural Control Postural Control: Deficits on evaluation Trunk Control: increased lean to the right in sitting during ADL tasks  Balance Balance Balance Assessed: Yes Static Sitting Balance Static Sitting - Level of Assistance: 6: Modified independent (Device/Increase time) Dynamic Sitting Balance Dynamic Sitting - Balance Support: No upper extremity supported;Feet supported Dynamic Sitting - Level of Assistance: 5: Stand by assistance Static Standing Balance Static Standing - Balance Support: No upper extremity supported Static Standing - Level of Assistance: 4: Min assist Dynamic Standing Balance Dynamic Standing - Balance Support: No upper extremity supported Dynamic Standing - Level of Assistance: 3: Mod assist Extremity/Trunk Assessment RUE Assessment RUE Assessment: Exceptions to [facility] (Pt with history of CVA in 2012.  Shoudler flexion AROM 0-90 degrees with pt reporting frozen shoulder.  all other joints AROM WFLs but smoothness of movement is limited secondary to ataxia.  Strength 3+/5) LUE Assessment LUE Assessment: Exceptions to [facility] (AROM shoulder flexion 0-100 degrees secondary to history of frozen shoulder.  AROM WFLS for all other joints with strength 4/5 throughout.)   See Function Navigator for Current  Functional Status.   Refer to Care Plan for Long Term Goals  Recommendations for other services: None  Discharge Criteria: Patient will be discharged from OT if patient refuses treatment 3 consecutive times without medical reason, if treatment goals not met, if there is a change in medical status, if patient makes  no progress towards goals or if patient is discharged from hospital.  The above assessment, treatment plan, treatment alternatives and goals were discussed and mutually agreed upon: by patient  Brice Potteiger OTR/L 05/26/2015, 5:08 PM

## 2015-05-27 ENCOUNTER — Inpatient Hospital Stay (HOSPITAL_COMMUNITY): Payer: Medicare Other | Admitting: Occupational Therapy

## 2015-05-27 ENCOUNTER — Inpatient Hospital Stay (HOSPITAL_COMMUNITY): Payer: Medicare Other | Admitting: Physical Therapy

## 2015-05-27 DIAGNOSIS — G8191 Hemiplegia, unspecified affecting right dominant side: Secondary | ICD-10-CM

## 2015-05-27 LAB — GLUCOSE, CAPILLARY
GLUCOSE-CAPILLARY: 198 mg/dL — AB (ref 65–99)
GLUCOSE-CAPILLARY: 58 mg/dL — AB (ref 65–99)
GLUCOSE-CAPILLARY: 355 mg/dL — AB (ref 65–99)
GLUCOSE-CAPILLARY: 487 mg/dL — AB (ref 65–99)
Glucose-Capillary: 44 mg/dL — CL (ref 65–99)
Glucose-Capillary: 169 mg/dL — ABNORMAL HIGH (ref 65–99)
Glucose-Capillary: 247 mg/dL — ABNORMAL HIGH (ref 65–99)
Glucose-Capillary: 558 mg/dL (ref 65–99)
Glucose-Capillary: 531 mg/dL — ABNORMAL HIGH (ref 65–99)
Glucose-Capillary: 66 mg/dL (ref 65–99)
Glucose-Capillary: 165 mg/dL — ABNORMAL HIGH (ref 65–99)

## 2015-05-27 MED ORDER — INSULIN ASPART 100 UNIT/ML ~~LOC~~ SOLN
14.0000 [IU] | Freq: Once | SUBCUTANEOUS | Status: AC
  Administered 2015-05-27: 14 [IU] via SUBCUTANEOUS

## 2015-05-27 NOTE — Progress Notes (Signed)
At 12 noon patient CBG of 531, notified Dan Angiulli, PA given order for Novolog 14 units once. Patient has no complaints of discomfort or lightheadedness. Will continue to monitor patient. [place] [address] [place]

## 2015-05-27 NOTE — IPOC Note (Signed)
Overall Plan of Care [facility]) Patient Details Name: [name] MRN: [id number] DOB: Oct 12, 1960  Admitting Diagnosis: L CVA  Hospital Problems: Active Problems:   Left pontine cerebrovascular accident [facility])   Coronary artery disease involving native coronary artery of native heart without angina pectoris   Chronic systolic congestive heart failure (HCC)   Anemia, iron deficiency   HLD (hyperlipidemia)     Functional Problem List: Nursing Edema, Endurance, Medication Management, Nutrition, Pain, Safety, Sensory, Skin Integrity  PT Balance, Endurance, Motor, Safety, Sensory  OT Balance, Cognition, Motor, Endurance  SLP    TR         Basic ADL's: OT Eating, Grooming, Bathing, Dressing, Toileting     Advanced  ADL's: OT Simple Meal Preparation, Light Housekeeping, Laundry     Transfers: PT Bed Mobility, Bed to Chair, Set designer, Occupational psychologist, Research scientist (life sciences): PT Ambulation, Psychologist, prison and probation services, Stairs     Additional Impairments: OT Fuctional Use of Upper Extremity  SLP        TR      Anticipated Outcomes Item Anticipated Outcome  Self Feeding independent  Swallowing      Basic self-care  modified independent  Toileting  modified independent   Bathroom Transfers modified independent  Bowel/Bladder  Mod I  Transfers  S overall  Locomotion  S gait x 200' controlled and community enb, 50' home env; up/down 12 steps and ramp/curb with supervision  Communication     Cognition     Pain  <3  Safety/Judgment  Mod I   Therapy Plan: PT Intensity: Minimum of 1-2 x/day ,45 to 90 minutes PT Frequency: 5 out of 7 days PT Duration Estimated Length of Stay: 7-10 OT Intensity: Minimum of 1-2 x/day, 45 to 90 minutes OT Frequency: 5 out of 7 days OT Duration/Estimated Length of Stay: 10-12 days         Team Interventions: Nursing Interventions Patient/Family Education, Disease Management/Prevention, Pain Management, Medication Management, Skin Care/Wound  Management, Discharge Planning, Psychosocial Support  PT interventions Ambulation/gait training, Warden/ranger, Cognitive remediation/compensation, Community reintegration, Discharge planning, Neuromuscular re-education, Functional mobility training, DME/adaptive equipment instruction, Pain management, Patient/family education, Psychosocial support, Splinting/orthotics, UE/LE Coordination activities, UE/LE Strength taining/ROM, Therapeutic Exercise, Therapeutic Activities, Wheelchair propulsion/positioning, Stair training, Visual/perceptual remediation/compensation, Functional electrical stimulation  OT Interventions Warden/ranger, Cognitive remediation/compensation, Discharge planning, Patient/family education, Therapeutic Exercise, UE/LE Strength taining/ROM, UE/LE Coordination activities, Therapeutic Activities, DME/adaptive equipment instruction, Community reintegration, Neuromuscular re-education, Self Care/advanced ADL retraining  SLP Interventions    TR Interventions    SW/CM Interventions Discharge Planning, Psychosocial Support, Patient/Family Education    Team Discharge Planning: Destination: PT-Home ,OT- Home , SLP-  Projected Follow-up: PT-Outpatient PT, OT-  Home health OT, SLP-  Projected Equipment Needs: PT-To be determined, OT- 3 in 1 bedside comode, None recommended by OT, SLP-  Equipment Details: PT-pt owns RW and 4 pt cane, OT-  Patient/family involved in discharge planning: PT- Patient,  OT-Patient, SLP-   MD ELOS: 7-10d Medical Rehab Prognosis:  Good Assessment: 55 y.o. 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 Patient lives with parent. One level home with ramped entrance. Mother is 57 years old and limited assistance. Patient uses a rolling walker prior to  admission. Presented 05/22/2015 with double vision decrease in balance and recent fall while using a walker. MRI of the brain showed acute small  nonhemorrhagic infarct left paracentral midbrain and posterior left paracentral pons. Remote small infarcts scattered throughout the centrum semiovale ovale, remote thalamic infarct. Chronic small vessel disease changes   Now requiring 24/7 Rehab RN,MD, as well as CIR level PT, OT and SLP.  Treatment team will focus on ADLs and mobility with goals set at [facility] I/Sup See Team Conference Notes for weekly updates to the plan of care

## 2015-05-27 NOTE — Significant Event (Signed)
Hypoglycemic Event  CBG:66  Treatment: 15 GM carbohydrate snack  Symptoms: None   Follow-up CBG: Time:1707 CBG Result:74  Possible Reasons for Event: Unknown  Comments/MD notified:Dan Angiuilli, PA continue with plan of care.       [place], Arvella Merles

## 2015-05-27 NOTE — Progress Notes (Signed)
55 y.o. 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 Patient lives with parent. One level home with ramped entrance. Mother is 63 years old and limited assistance. Patient uses a rolling walker prior to admission. Presented 05/22/2015 with double vision decrease in balance and recent fall while using a walker. MRI of the brain showed acute small nonhemorrhagic infarct left paracentral midbrain and posterior left paracentral pons. Remote small infarcts scattered throughout the centrum semiovale ovale, remote thalamic infarct. Chronic small vessel disease changes. CT angiogram head showed no major intracranial arterial occlusion. Echocardiogram with ejection fraction of 40%. Systolic function moderately reduced. No cardiac source of emboli identified..   Subjective/Complaints: No issues overnite, discussed diabetic management  ROS- no N/V, diarrhea, contipation, no CP or SOB Objective: Vital Signs: Blood pressure 122/60, pulse 74, temperature 98.1 F (36.7 C), temperature source Oral, resp. rate 17, height 5' 4"  (1.626 m), weight 69.264 kg (152 lb 11.2 oz), last menstrual period 03/13/2011, SpO2 99 %. No results found. Results for orders placed or performed during the hospital encounter of 05/25/15 (from the past 72 hour(s))  CBC     Status: None   Collection Time: 05/25/15  6:27 PM  Result Value Ref Range   WBC 6.5 4.0 - 10.5 K/uL   RBC 4.69 3.87 - 5.11 MIL/uL   Hemoglobin 14.5 12.0 - 15.0 g/dL   HCT 42.5 36.0 - 46.0 %   MCV 90.6 78.0 - 100.0 fL   MCH 30.9 26.0 - 34.0 pg   MCHC 34.1 30.0 - 36.0 g/dL   RDW 12.7 11.5 - 15.5 %   Platelets 211 150 - 400 K/uL  Creatinine, serum     Status: Abnormal   Collection Time: 05/25/15  6:27 PM  Result Value Ref Range   Creatinine, Ser 1.30 (H) 0.44 - 1.00 mg/dL   GFR  calc non Af Amer 45 (L) >60 mL/min   GFR calc Af Amer 52 (L) >60 mL/min    Comment: (NOTE) The eGFR has been calculated using the CKD EPI equation. This calculation has not been validated in all clinical situations. eGFR's persistently <60 mL/min signify possible Chronic Kidney Disease.   Glucose, capillary     Status: Abnormal   Collection Time: 05/25/15  9:30 PM  Result Value Ref Range   Glucose-Capillary 367 (H) 65 - 99 mg/dL  CBC WITH DIFFERENTIAL     Status: Abnormal   Collection Time: 05/26/15  6:45 AM  Result Value Ref Range   WBC 6.1 4.0 - 10.5 K/uL   RBC 4.96 3.87 - 5.11 MIL/uL   Hemoglobin 15.3 (H) 12.0 - 15.0 g/dL   HCT 43.9 36.0 - 46.0 %   MCV 88.5 78.0 - 100.0 fL   MCH 30.8 26.0 - 34.0 pg   MCHC 34.9 30.0 - 36.0 g/dL   RDW 12.6 11.5 - 15.5 %   Platelets 205 150 - 400 K/uL   Neutrophils Relative % 44 %   Neutro Abs 2.8 1.7 - 7.7 K/uL   Lymphocytes Relative 39 %   Lymphs Abs 2.4 0.7 - 4.0 K/uL   Monocytes Relative 9 %   Monocytes Absolute 0.5 0.1 - 1.0 K/uL   Eosinophils Relative 7 %   Eosinophils Absolute 0.4 0.0 - 0.7 K/uL   Basophils Relative 1 %   Basophils Absolute 0.0 0.0 - 0.1 K/uL  Comprehensive metabolic panel  Status: Abnormal   Collection Time: 05/26/15  6:45 AM  Result Value Ref Range   Sodium 138 135 - 145 mmol/L   Potassium 4.3 3.5 - 5.1 mmol/L    Comment: SLIGHT HEMOLYSIS   Chloride 101 101 - 111 mmol/L   CO2 25 22 - 32 mmol/L   Glucose, Bld 254 (H) 65 - 99 mg/dL   BUN 19 6 - 20 mg/dL   Creatinine, Ser 1.12 (H) 0.44 - 1.00 mg/dL   Calcium 9.4 8.9 - 10.3 mg/dL   Total Protein 6.7 6.5 - 8.1 g/dL   Albumin 3.5 3.5 - 5.0 g/dL   AST 35 15 - 41 U/L   ALT 27 14 - 54 U/L   Alkaline Phosphatase 61 38 - 126 U/L   Total Bilirubin 0.8 0.3 - 1.2 mg/dL   GFR calc non Af Amer 54 (L) >60 mL/min   GFR calc Af Amer >60 >60 mL/min    Comment: (NOTE) The eGFR has been calculated using the CKD EPI equation. This calculation has not been validated in all  clinical situations. eGFR's persistently <60 mL/min signify possible Chronic Kidney Disease.    Anion gap 12 5 - 15  Glucose, capillary     Status: Abnormal   Collection Time: 05/26/15  6:47 AM  Result Value Ref Range   Glucose-Capillary 219 (H) 65 - 99 mg/dL      General: No acute distress Mood and affect are appropriate Eyes- non injected, keeps Left eye closed  Heart: Regular rate and rhythm no rubs murmurs or extra sounds Lungs: Clear to auscultation, breathing unlabored, no rales or wheezes Abdomen: Positive bowel sounds, soft nontender to palpation, nondistended Extremities: No clubbing, cyanosis, or edema Skin: No evidence of breakdown, no evidence of rash Neurologic: Cranial nerve IV palsy, motor strength is 5/5 in left  deltoid, bicep, tricep, grip,Bilateral hip flexor, knee extensors, ankle dorsiflexor and plantar flexor, 4/5 RUE with ataxia Sensory exam normal sensation to light touch and proprioception in bilateral upper and lower extremities Cerebellar exam normal finger to nose to finger as well as heel to shin in bilateral upper and lower extremities Musculoskeletal: Full range of motion in all 4 extremities. No joint swelling   Assessment/Plan: 1. Functional deficits secondary to Left paracentral midbrain and pontine infarcts which require 3+ hours per day of interdisciplinary therapy in a comprehensive inpatient rehab setting. Physiatrist is providing close team supervision and 24 hour management of active medical problems listed below. Physiatrist and rehab team continue to assess barriers to discharge/monitor patient progress toward functional and medical goals. FIM: Function - Bathing Position: Shower Body parts bathed by patient: Right arm, Left arm, Chest, Abdomen, Front perineal area, Buttocks, Right upper leg, Left upper leg, Right lower leg, Left lower leg, Back Assist Level: Touching or steadying assistance(Pt > 75%)  Function- Upper Body  Dressing/Undressing What is the patient wearing?: Pull over shirt/dress Pull over shirt/dress - Perfomed by patient: Thread/unthread right sleeve, Thread/unthread left sleeve, Put head through opening, Pull shirt over trunk Assist Level: Supervision or verbal cues Function - Lower Body Dressing/Undressing What is the patient wearing?: Underwear, Pants Position: Sitting EOB Underwear - Performed by patient: Thread/unthread right underwear leg, Thread/unthread left underwear leg, Pull underwear up/down Pants- Performed by patient: Thread/unthread right pants leg, Thread/unthread left pants leg, Pull pants up/down Socks - Performed by patient: Don/doff right sock, Don/doff left sock Shoes - Performed by patient: Don/doff right shoe, Don/doff left shoe, Fasten right, Fasten left TED Hose - Performed by  helper: Don/doff right TED hose, Don/doff left TED hose Assist for lower body dressing: Supervision or verbal cues  Function - Toileting Toileting steps completed by patient: Adjust clothing prior to toileting, Performs perineal hygiene, Adjust clothing after toileting Toileting Assistive Devices: Toilet aid Assist level: Touching or steadying assistance (Pt.75%)  Function - Toilet Transfers Toilet transfer assistive device: Bedside commode Assist level to toilet: Touching or steadying assistance (Pt > 75%) Assist level from toilet: Touching or steadying assistance (Pt > 75%) Assist level to bedside commode (at bedside): Touching or steadying assistance (Pt > 75%) Assist level from bedside commode (at bedside): Touching or steadying assistance (Pt > 75%)  Function - Chair/bed transfer Chair/bed transfer method: Ambulatory Chair/bed transfer assist level: Moderate assist (Pt 50 - 74%/lift or lower) Chair/bed transfer details: Verbal cues for technique  Function - Locomotion: Wheelchair Will patient use wheelchair at discharge?: No Max wheelchair distance: 150 Assist Level: Supervision or  verbal cues Assist Level: Supervision or verbal cues Assist Level: Supervision or verbal cues Turns around,maneuvers to table,bed, and toilet,negotiates 3% grade,maneuvers on rugs and over doorsills: No Function - Locomotion: Ambulation Assistive device: No device Assist level: Moderate assist (Pt 50 - 74%) Assist level: Moderate assist (Pt 50 - 74%) Assist level: Moderate assist (Pt 50 - 74%) Assist level: Moderate assist (Pt 50 - 74%)  Function - Comprehension Comprehension: Auditory Comprehension assist level: Follows complex conversation/direction with no assist  Function - Expression Expression: Verbal Expression assist level: Expresses complex ideas: With extra time/assistive device  Function - Social Interaction Social Interaction assist level: Interacts appropriately with others - No medications needed.  Function - Problem Solving Problem solving assist level: Solves basic problems with no assist  Function - Memory Memory assist level: Recognizes or recalls 75 - 89% of the time/requires cueing 10 - 24% of the time Patient normally able to recall (first 3 days only): Current season, Staff names and faces, That he or she is in a hospital, Location of own room  Medical Problem List and Plan: 1.  Diplopia, gait deficits secondary to left paracentral-midbrain and posterior left paracentral pontine infarct- cont CIR program 2.  DVT Prophylaxis/Anticoagulation: Subcutaneous Lovenox. Monitor platelet counts and any signs of bleeding 3. Pain Management: Cymbalta 30 mg daily 4. Mood: Celexa 20 mg daily. Provide emotional support 5. Neuropsych: This patient is capable of making decisions on her own behalf. 6. Skin/Wound Care: Routine skin checks 7. Fluids/Electrolytes/Nutrition: Routine I&O with follow up labs 8. Hypertension. Coreg 6.25 mg twice a day, Lanoxin 0.0625 mg every other day. Monitor with increased mobility 9. Chronic systolic congestive heart failure with ischemic  cardiomyopathy/NSTEMI. Lasix 20 mg daily. Monitor for any signs of fluid overload 10. Diabetes mellitus and peripheral neuropathy. Hemoglobin A1c 11. Levemir  bump to 16 U  CBG (last 3)   Recent Labs  05/25/15 1700 05/25/15 2130 05/26/15 0647  GLUCAP 333* 367* 219*    Increase Novalog meal coverage to 8U Check blood sugars before meals and at bedtime. Diabetic teaching 11. Iron deficiency anemia. Resolved on Ferrous sulfate 650 mg daily. 12. Hyperlipidemia. Crestor- no myalgias   LOS (Days) 2 A FACE TO FACE EVALUATION WAS PERFORMED  KIRSTEINS,ANDREW E 05/27/2015, 7:15 AM

## 2015-05-27 NOTE — Progress Notes (Signed)
Occupational Therapy Session Note  Patient Details  Name: [name] MRN: [id number] Date of Birth: 1960/09/20  Today's Date: 05/27/2015 OT Individual Time: 0900-1000 OT Individual Time Calculation (min): 60 min    Short Term Goals: Week 1:  OT Short Term Goal 1 (Week 1): Pt will complete all bathing with modified independent from shower level.  OT Short Term Goal 2 (Week 1): Pt will complete toileting and toilet transfers with modified independence. OT Short Term Goal 3 (Week 1): Pt will complete tub/shower transfers with supervision using shower seat.  OT Short Term Goal 4 (Week 1): Pt will complete simple meal prep at modified independent level using RW.   Skilled Therapeutic Interventions/Progress Updates:    Pt reporting diplopia much improved this morning compared to yesterday.  Did not note any diplopia with gross testing at 15-20 inches tracking.  Pt able to read as well without report of diplopia.  Had her ambulate with the RW around the room to gather her clothing and towels needed for showering.  Pt needs min instructional cueing for correct placement of walker when reaching for items as she attempts to turn away from it.  One LOB noted requiring min assist to correct.  Pt completed shower sit to stand sitting on the shower seat with supervision.  Dressing completed from wheelchair level with close supervision as well.  She was able to complete grooming tasks in standing to conclude session.   Pt left in wheelchair with call button and phone within reach.    Therapy Documentation Precautions:  Precautions Precautions: Fall Restrictions Weight Bearing Restrictions: No  Pain: Pain Assessment Pain Assessment: No/denies pain ADL: See Function Navigator for Current Functional Status.   Therapy/Group: Individual Therapy  Jaden Batchelder OTR/L 05/27/2015, 12:10 PM

## 2015-05-27 NOTE — Progress Notes (Signed)
Occupational Therapy Session Note  Patient Details  Name: [name] MRN: [id number] Date of Birth: 1961-02-22  Today's Date: 05/27/2015 OT Individual Time: 4098-1191 OT Individual Time Calculation (min): 61 min    Skilled Therapeutic Interventions/Progress Updates:    Pt ambulated to the therapy gym with min guard assist and the RW.  Once in the gym focused session on functional reaching with both UEs in standing to further challenge balance.  Pt demonstrates increased weightshift to the right in standing with LOB when asked to reach down to retrieve and object anywhere from 2 feet or lower to the floor.  Mod assist to correct balance.  When reaching up with the LUE had pt focus on maintaining midline posture with head and trunk as she tends to lean to the right.  Pt became tearful on one occasion as she was upset she could not do what the therapist was asking her to do as well as she thought.  Therapist encouraged her that she was doing well and that some of the tasks we are doing are challenging in order to benefit her.  She was able to voice understanding and complete reaching tasks before ambulating back to the room with the RW.  Pt left in wheelchair with call button within reach and safety belt in place.    Therapy Documentation Precautions:  Precautions Precautions: Fall Restrictions Weight Bearing Restrictions: No  Pain: Pain Assessment Pain Assessment: No/denies pain ADL: See Function Navigator for Current Functional Status.   Therapy/Group: Individual Therapy  Lashell Moffitt OTR/L 05/27/2015, 4:10 PM

## 2015-05-27 NOTE — Progress Notes (Signed)
Physical Therapy Session Note  Patient Details  Name: [name] MRN: [id number] Date of Birth: 1960/08/31  Today's Date: 05/27/2015 PT Individual Time: 0805-0900 and 1505-1530 PT Individual Time Calculation (min): 55 min and 25 min  Short Term Goals: Week 1:  PT Short Term Goal 1 (Week 1): = LTGs due ELOS  Skilled Therapeutic Interventions/Progress Updates:   Treatment 1: Patient received in bed, transferred to edge of bed with HOB elevated with supervision and performed stand step transfer to wheelchair with min-mod A. Simulated car transfer to sedan height with mod A HHA and verbal cues for technique. Patient retrieved object from floor with max A. Switched out wheelchair for 16x16 manual wheelchair and cushion for improved fit and sitting tolerance. Patient propelled wheelchair using BUE x 100 ft with supervision and increased time, cues for steering. Functional ambulation with R HHA on level surface x 90 ft with min A and on uneven surface x 10 ft with mod-max A. Patient left sitting in wheelchair with all needs within reach.   Outcome Measures:  10 MWT with R min HHA = 0.33 m/s  TUG using RW with close supervision, avg of 3 trials (46 sec, 50 sec, and 50 sec) = 48.67 sec  Treatment 2: Patient reporting fatigue but agreeable to treatment. Functional ambulation using RW x 130 ft + 50 ft + 180 ft with close supervision. Performed sit <> stand transfers using RW with supervision. Kinetron in standing with focus on weight shifting, neuro re-ed, and upright midline postural control at 10 cm/sec x 4 trials: 2 x 60 sec and 2 x 30 sec. Patient required prolonged seated rest breaks between trials. Stair training up/down 4 (6") stairs using 2 rails with step-to pattern and close supervision. Patient requested to return to bed in room, left semi reclined with all needs within reach and bed alarm on.   Therapy Documentation Precautions:  Precautions Precautions: Fall Restrictions Weight Bearing  Restrictions: No Pain: Pain Assessment Pain Assessment: No/denies pain   See Function Navigator for Current Functional Status.   Therapy/Group: Individual Therapy  Kerney Elbe 05/27/2015, 9:04 AM

## 2015-05-27 NOTE — Significant Event (Signed)
Hypoglycemic Event  CBG: 57  Treatment: 15 GM carbohydrate snack  Symptoms: Sweaty  Follow-up CBG: Time:2134 CBG Result:64          Time: 2251          Result: 165  Possible Reasons for Event: Unknown  Comments/MD notified: Pt successfully ate snack given, CBG was elevated to WNL    Andru Genter, Phill Mutter

## 2015-05-27 NOTE — Progress Notes (Signed)
Rechecked CBG after a hour per order for 487. Made Dan A. PA aware given order to continue to monitor patient. Patient is asymptomatic, will continue to monitor.[place]

## 2015-05-28 ENCOUNTER — Inpatient Hospital Stay (HOSPITAL_COMMUNITY): Payer: Medicare Other | Admitting: Occupational Therapy

## 2015-05-28 ENCOUNTER — Inpatient Hospital Stay (HOSPITAL_COMMUNITY): Payer: Medicare Other | Admitting: Physical Therapy

## 2015-05-28 DIAGNOSIS — E1065 Type 1 diabetes mellitus with hyperglycemia: Secondary | ICD-10-CM

## 2015-05-28 DIAGNOSIS — E1051 Type 1 diabetes mellitus with diabetic peripheral angiopathy without gangrene: Secondary | ICD-10-CM

## 2015-05-28 LAB — CULTURE, BLOOD (ROUTINE X 2)
CULTURE: NO GROWTH
Culture: NO GROWTH

## 2015-05-28 LAB — GLUCOSE, CAPILLARY
GLUCOSE-CAPILLARY: 37 mg/dL — AB (ref 65–99)
GLUCOSE-CAPILLARY: 104 mg/dL — AB (ref 65–99)
Glucose-Capillary: 243 mg/dL — ABNORMAL HIGH (ref 65–99)
Glucose-Capillary: 61 mg/dL — ABNORMAL LOW (ref 65–99)
Glucose-Capillary: 167 mg/dL — ABNORMAL HIGH (ref 65–99)

## 2015-05-28 MED ORDER — INSULIN DETEMIR 100 UNIT/ML ~~LOC~~ SOLN
18.0000 [IU] | Freq: Every day | SUBCUTANEOUS | Status: DC
  Administered 2015-05-29: 18 [IU] via SUBCUTANEOUS
  Filled 2015-05-28 (×2): qty 0.18

## 2015-05-28 MED ORDER — INSULIN ASPART 100 UNIT/ML ~~LOC~~ SOLN
0.0000 [IU] | Freq: Three times a day (TID) | SUBCUTANEOUS | Status: DC
  Administered 2015-05-29: 7 [IU] via SUBCUTANEOUS

## 2015-05-28 MED ORDER — INSULIN ASPART 100 UNIT/ML ~~LOC~~ SOLN
7.0000 [IU] | Freq: Three times a day (TID) | SUBCUTANEOUS | Status: DC
  Administered 2015-05-28 – 2015-05-30 (×7): 7 [IU] via SUBCUTANEOUS

## 2015-05-28 MED ORDER — GLUCOSE 40 % PO GEL
ORAL | Status: AC
  Administered 2015-05-28: 37.5 g
  Filled 2015-05-28: qty 1

## 2015-05-28 NOTE — Progress Notes (Signed)
Occupational Therapy Session Note  Patient Details  Name: [name] MRN: [id number] Date of Birth: 1960-12-19  Today's Date: 05/28/2015 OT Individual Time: 1445-1515 OT Individual Time Calculation (min): 30 min    Short Term Goals: Week 1:  OT Short Term Goal 1 (Week 1): Pt will complete all bathing with modified independent from shower level.  OT Short Term Goal 2 (Week 1): Pt will complete toileting and toilet transfers with modified independence. OT Short Term Goal 3 (Week 1): Pt will complete tub/shower transfers with supervision using shower seat.  OT Short Term Goal 4 (Week 1): Pt will complete simple meal prep at modified independent level using RW.   Skilled Therapeutic Interventions/Progress Updates:  Upon entering the room, pt seated in wheelchair awaiting therapist with no c/o pain this session. Pt requesting to use bathroom. Sit >stand from wheelchair with supervision. Ambulation to bathroom of 10' with use of RW and close supervision. Pt performed hygiene and clothing management with steady assistance for safety. Pt then ambulating to dayroom in same manner. Pt taking seated rest break secondary to fatigue before carrying small plant pitcher with RW to fill up. Min instructional cues for how to carry full water pitcher with RW safely. Pt then sliding pitcher across counter while checking plants and watering them as needed with close supervision - steady assist with reaching/pouring. Pt returned to room at end of session. Pt seated in wheelchair with call bell and all needed items within reach upon exiting the room.   Therapy Documentation Precautions:  Precautions Precautions: Fall Restrictions Weight Bearing Restrictions: No General:   Vital Signs: Therapy Vitals Temp: 98 F (36.7 C) Temp Source: Oral Pulse Rate: 70 Resp: 16 BP: 116/88 mmHg Patient Position (if appropriate): Sitting Oxygen Therapy SpO2: 100 % O2 Device: Not Delivered  See Function Navigator for  Current Functional Status.   Therapy/Group: Individual Therapy  Lowella Grip 05/28/2015, 3:26 PM

## 2015-05-28 NOTE — Progress Notes (Signed)
Hypoglycemic Event  CBG: 1724  Treatment: glucose gel  Symptoms: none  Follow-up CBG: Time:1800 CBG Result:104  Possible Reasons for Event: unknown  Comments/MD notified:    Gwenyth Allegra

## 2015-05-28 NOTE — Progress Notes (Signed)
Subjective/Complaints: Discussed diabetic management, Type 1, brittle, pt stated CBGs ok at home howevere HgbA1C of 11 speaks against this  ROS- no N/V, diarrhea, contipation, no CP or SOB Objective: Vital Signs: Blood pressure 129/74, pulse 83, temperature 97.8 F (36.6 C), temperature source Oral, resp. rate 18, height _0  (1.626 m), weight 69.355 kg (152 lb 14.4 oz), last menstrual period 03/13/2011, SpO2 97 %. No results found. Results for orders placed or performed during the hospital encounter of 05/25/15 (from the past 72 hour(s))  CBC     Status: None   Collection Time: 05/25/15  6:27 PM  Result Value Ref Range   WBC 6.5 4.0 - 10.5 K/uL   RBC 4.69 3.87 - 5.11 MIL/uL   Hemoglobin 14.5 12.0 - 15.0 g/dL   HCT 42.5 36.0 - 46.0 %   MCV 90.6 78.0 - 100.0 fL   MCH 30.9 26.0 - 34.0 pg   MCHC 34.1 30.0 - 36.0 g/dL   RDW 12.7 11.5 - 15.5 %   Platelets 211 150 - 400 K/uL  Creatinine, serum     Status: Abnormal   Collection Time: 05/25/15  6:27 PM  Result Value Ref Range   Creatinine, Ser 1.30 (H) 0.44 - 1.00 mg/dL   GFR calc non Af Amer 45 (L) >60 mL/min   GFR calc Af Amer 52 (L) >60 mL/min    Comment: (NOTE) The eGFR has been calculated using the CKD EPI equation. This calculation has not been validated in all clinical situations. eGFR's persistently <60 mL/min signify possible Chronic Kidney Disease.   Glucose, capillary     Status: Abnormal   Collection Time: 05/25/15  9:30 PM  Result Value Ref Range   Glucose-Capillary 367 (H) 65 - 99 mg/dL  CBC WITH DIFFERENTIAL     Status: Abnormal   Collection Time: 05/26/15  6:45 AM  Result Value Ref Range   WBC 6.1 4.0 - 10.5 K/uL   RBC 4.96 3.87 - 5.11 MIL/uL   Hemoglobin 15.3 (H) 12.0 - 15.0 g/dL   HCT 43.9 36.0 - 46.0 %   MCV 88.5 78.0 - 100.0 fL   MCH 30.8 26.0 - 34.0 pg   MCHC 34.9 30.0 - 36.0 g/dL   RDW 12.6 11.5 - 15.5 %   Platelets 205 150 - 400 K/uL   Neutrophils Relative % 44 %   Neutro Abs 2.8 1.7 - 7.7 K/uL    Lymphocytes Relative 39 %   Lymphs Abs 2.4 0.7 - 4.0 K/uL   Monocytes Relative 9 %   Monocytes Absolute 0.5 0.1 - 1.0 K/uL   Eosinophils Relative 7 %   Eosinophils Absolute 0.4 0.0 - 0.7 K/uL   Basophils Relative 1 %   Basophils Absolute 0.0 0.0 - 0.1 K/uL  Comprehensive metabolic panel     Status: Abnormal   Collection Time: 05/26/15  6:45 AM  Result Value Ref Range   Sodium 138 135 - 145 mmol/L   Potassium 4.3 3.5 - 5.1 mmol/L    Comment: SLIGHT HEMOLYSIS   Chloride 101 101 - 111 mmol/L   CO2 25 22 - 32 mmol/L   Glucose, Bld 254 (H) 65 - 99 mg/dL   BUN 19 6 - 20 mg/dL   Creatinine, Ser 1.12 (H) 0.44 - 1.00 mg/dL   Calcium 9.4 8.9 - 10.3 mg/dL   Total Protein 6.7 6.5 - 8.1 g/dL   Albumin 3.5 3.5 - 5.0 g/dL   AST 35 15 - 41 U/L   ALT 27  14 - 54 U/L   Alkaline Phosphatase 61 38 - 126 U/L   Total Bilirubin 0.8 0.3 - 1.2 mg/dL   GFR calc non Af Amer 54 (L) >60 mL/min   GFR calc Af Amer >60 >60 mL/min    Comment: (NOTE) The eGFR has been calculated using the CKD EPI equation. This calculation has not been validated in all clinical situations. eGFR's persistently <60 mL/min signify possible Chronic Kidney Disease.    Anion gap 12 5 - 15  Glucose, capillary     Status: Abnormal   Collection Time: 05/26/15  6:47 AM  Result Value Ref Range   Glucose-Capillary 219 (H) 65 - 99 mg/dL  Glucose, capillary     Status: Abnormal   Collection Time: 05/26/15 11:51 AM  Result Value Ref Range   Glucose-Capillary 198 (H) 65 - 99 mg/dL  Glucose, capillary     Status: Abnormal   Collection Time: 05/26/15  4:40 PM  Result Value Ref Range   Glucose-Capillary 58 (L) 65 - 99 mg/dL  Glucose, capillary     Status: Abnormal   Collection Time: 05/26/15  5:01 PM  Result Value Ref Range   Glucose-Capillary 44 (LL) 65 - 99 mg/dL   Comment 1 Notify RN   Glucose, capillary     Status: Abnormal   Collection Time: 05/26/15  5:45 PM  Result Value Ref Range   Glucose-Capillary 169 (H) 65 - 99 mg/dL   Glucose, capillary     Status: Abnormal   Collection Time: 05/26/15  9:33 PM  Result Value Ref Range   Glucose-Capillary 247 (H) 65 - 99 mg/dL  Glucose, capillary     Status: Abnormal   Collection Time: 05/27/15  6:52 AM  Result Value Ref Range   Glucose-Capillary 355 (H) 65 - 99 mg/dL   Comment 1 Notify RN   Glucose, capillary     Status: Abnormal   Collection Time: 05/27/15 11:29 AM  Result Value Ref Range   Glucose-Capillary 558 (HH) 65 - 99 mg/dL   Comment 1 Notify RN   Glucose, capillary     Status: Abnormal   Collection Time: 05/27/15 11:41 AM  Result Value Ref Range   Glucose-Capillary 531 (H) 65 - 99 mg/dL  Glucose, capillary     Status: Abnormal   Collection Time: 05/27/15 12:38 PM  Result Value Ref Range   Glucose-Capillary 487 (H) 65 - 99 mg/dL  Glucose, capillary     Status: None   Collection Time: 05/27/15  4:34 PM  Result Value Ref Range   Glucose-Capillary 66 65 - 99 mg/dL  Glucose, capillary     Status: Abnormal   Collection Time: 05/27/15 10:51 PM  Result Value Ref Range   Glucose-Capillary 165 (H) 65 - 99 mg/dL  Glucose, capillary     Status: Abnormal   Collection Time: 05/28/15  6:54 AM  Result Value Ref Range   Glucose-Capillary 243 (H) 65 - 99 mg/dL      General: No acute distress Mood and affect are appropriate Eyes- non injected, keeps Left eye closed  Heart: Regular rate and rhythm no rubs murmurs or extra sounds Lungs: Clear to auscultation, breathing unlabored, no rales or wheezes Abdomen: Positive bowel sounds, soft nontender to palpation, nondistended Extremities: No clubbing, cyanosis, or edema Skin: No evidence of breakdown, no evidence of rash Neurologic: Cranial nerve IV palsy, motor strength is 5/5 in left  deltoid, bicep, tricep, grip,Bilateral hip flexor, knee extensors, ankle dorsiflexor and plantar flexor, 4/5 RUE with ataxia  Sensory exam normal sensation to light touch and proprioception in bilateral upper and lower  extremities Cerebellar exam normal finger to nose to finger as well as heel to shin in bilateral upper and lower extremities Musculoskeletal: Full range of motion in all 4 extremities. No joint swelling   Assessment/Plan: 1. Functional deficits secondary to Left paracentral midbrain and pontine infarcts which require 3+ hours per day of interdisciplinary therapy in a comprehensive inpatient rehab setting. Physiatrist is providing close team supervision and 24 hour management of active medical problems listed below. Physiatrist and rehab team continue to assess barriers to discharge/monitor patient progress toward functional and medical goals. FIM: Function - Bathing Position: Shower Body parts bathed by patient: Right arm, Left arm, Chest, Abdomen, Front perineal area, Buttocks, Right upper leg, Left upper leg, Right lower leg, Left lower leg Bathing not applicable: Back Assist Level: Touching or steadying assistance(Pt > 75%)  Function- Upper Body Dressing/Undressing What is the patient wearing?: Pull over shirt/dress Pull over shirt/dress - Perfomed by patient: Thread/unthread right sleeve, Thread/unthread left sleeve, Put head through opening, Pull shirt over trunk Assist Level: Supervision or verbal cues Function - Lower Body Dressing/Undressing What is the patient wearing?: Underwear, Pants, Socks, Ted Hose Position: Wheelchair/chair at [employer] - Performed by patient: Thread/unthread right underwear leg, Thread/unthread left underwear leg, Pull underwear up/down Pants- Performed by patient: Thread/unthread right pants leg, Thread/unthread left pants leg, Pull pants up/down Socks - Performed by patient: Don/doff right sock, Don/doff left sock Socks - Performed by helper: Don/doff right sock, Don/doff left sock Shoes - Performed by patient: Don/doff right shoe, Don/doff left shoe, Fasten right, Fasten left TED Hose - Performed by helper: Don/doff right TED hose, Don/doff left  TED hose Assist for lower body dressing: Supervision or verbal cues  Function - Toileting Toileting steps completed by patient: Adjust clothing prior to toileting, Performs perineal hygiene, Adjust clothing after toileting Toileting Assistive Devices: Toilet aid Assist level: Touching or steadying assistance (Pt.75%)  Function - Toilet Transfers Toilet transfer assistive device: Bedside commode Assist level to toilet: Touching or steadying assistance (Pt > 75%) Assist level from toilet: Touching or steadying assistance (Pt > 75%) Assist level to bedside commode (at bedside): Touching or steadying assistance (Pt > 75%) Assist level from bedside commode (at bedside): Touching or steadying assistance (Pt > 75%)  Function - Chair/bed transfer Chair/bed transfer method: Ambulatory Chair/bed transfer assist level: Supervision or verbal cues Chair/bed transfer assistive device: Armrests, Walker Chair/bed transfer details: Verbal cues for technique  Function - Locomotion: Wheelchair Will patient use wheelchair at discharge?: No Type: Manual Max wheelchair distance: 100 Assist Level: Supervision or verbal cues Assist Level: Supervision or verbal cues Assist Level: Supervision or verbal cues Turns around,maneuvers to table,bed, and toilet,negotiates 3% grade,maneuvers on rugs and over doorsills: No Function - Locomotion: Ambulation Assistive device: Walker-rolling Max distance: 150 Assist level: Supervision or verbal cues Assist level: Supervision or verbal cues Assist level: Supervision or verbal cues Assist level: Supervision or verbal cues Assist level: Moderate assist (Pt 50 - 74%)  Function - Comprehension Comprehension: Auditory Comprehension assist level: Understands complex 90% of the time/cues 10% of the time  Function - Expression Expression: Verbal Expression assist level: Expresses complex ideas: With extra time/assistive device  Function - Social Interaction Social  Interaction assist level: Interacts appropriately with others - No medications needed.  Function - Problem Solving Problem solving assist level: Solves basic problems with no assist  Function - Memory Memory assist level: Recognizes or recalls  75 - 89% of the time/requires cueing 10 - 24% of the time Patient normally able to recall (first 3 days only): Current season, Staff names and faces, That he or she is in a hospital, Location of own room  Medical Problem List and Plan: 1.  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  DVT Prophylaxis/Anticoagulation: Subcutaneous Lovenox. Monitor platelet counts and any signs of bleeding 3. Pain Management: Cymbalta 30 mg daily 4. Mood: Celexa 20 mg daily. Provide emotional support 5. Neuropsych: This patient is capable of making decisions on her own behalf. 6. Skin/Wound Care: Routine skin checks  7. Fluids/Electrolytes/Nutrition: Routine I&O with follow up labs 8. Hypertension. Coreg 6.25 mg twice a day, Lanoxin 0.0625 mg every other day. Currently controlled 9. Chronic systolic congestive heart failure with ischemic cardiomyopathy/NSTEMI. Lasix 20 mg daily. Monitor for any signs of fluid overload 10. Diabetes mellitus and peripheral neuropathy. Hemoglobin A1c 11.  , pm cbg low yest decrease meal coverage and increase levimir to18U, consider adding metformin CBG (last 3)   Recent Labs  05/27/15 1634 05/27/15 2251 05/28/15 0654  GLUCAP 66 165* 243*    decrease Novalog meal coverage to 7U Check blood sugars before meals and at bedtime. Diabetic teaching 11. Iron deficiency anemia. Resolved on Ferrous sulfate 650 mg daily. 12. Hyperlipidemia. Crestor- no myalgias   LOS (Days) 3 A FACE TO FACE EVALUATION WAS PERFORMED  KIRSTEINS,ANDREW E 05/28/2015, 7:34 AM

## 2015-05-28 NOTE — Progress Notes (Signed)
Physical Therapy Session Note  Patient Details  Name: [name] MRN: [id number] Date of Birth: 24-Nov-1960  Today's Date: 05/28/2015 PT Individual Time: 1525-1605 PT Individual Time Calculation (min): 40 min   Short Term Goals: Week 1:  PT Short Term Goal 1 (Week 1): = LTGs due ELOS  Skilled Therapeutic Interventions/Progress Updates:   Session focused on functional ambulation, standing balance, postural control, activity tolerance, and patient education. Patient ambulated 2 x 150 ft using RW with supervision and cues for safe management of AD with steering and obstacle negotiation. Patient demonstrates high fall risk as noted by score of  23/56 on Berg Balance Scale. Patient became tearful with portions of Berg that she was not able to complete without assistance to prevent fall due to LOB. Education provided throughout on purpose and goals of rehab and need to work on patient's impairments to make progress, however patient with continued poor frustration tolerance with standing balance tasks. Patient with increased lateral lean to R in standing and increased difficulty with weight shifting to L. Gait without AD in controlled environment 2 x 20 ft with min A and manual facilitation for weight shift to L. Stair training up/down 4 (6") stairs using 2 rails with step-to pattern and supervision. Patient ambulated back to room and left sitting in wheelchair with all needs within reach.   Therapy Documentation Precautions:  Precautions Precautions: Fall Restrictions Weight Bearing Restrictions: No Pain: Pain Assessment Pain Assessment: No/denies pain  Balance: Balance Balance Assessed: Yes Standardized Balance Assessment Standardized Balance Assessment: Berg Balance Test Berg Balance Test Sit to Stand: Able to stand without using hands and stabilize independently Standing Unsupported: Able to stand 2 minutes with supervision Sitting with Back Unsupported but Feet Supported on Floor or Stool:  Able to sit safely and securely 2 minutes Stand to Sit: Sits safely with minimal use of hands Transfers: Able to transfer with verbal cueing and /or supervision Standing Unsupported with Eyes Closed: Able to stand 10 seconds with supervision Standing Ubsupported with Feet Together: Needs help to attain position and unable to hold for 15 seconds From Standing, Reach Forward with Outstretched Arm: Reaches forward but needs supervision From Standing Position, Pick up Object from Floor: Unable to try/needs assist to keep balance From Standing Position, Turn to Look Behind Over each Shoulder: Needs assist to keep from losing balance and falling Turn 360 Degrees: Needs close supervision or verbal cueing Standing Unsupported, Alternately Place Feet on Step/Stool: Needs assistance to keep from falling or unable to try (max A) Standing Unsupported, One Foot in Front: Loses balance while stepping or standing Standing on One Leg: Tries to lift leg/unable to hold 3 seconds but remains standing independently Total Score: 23/56   See Function Navigator for Current Functional Status.   Therapy/Group: Individual Therapy  Kerney Elbe 05/28/2015, 3:51 PM

## 2015-05-28 NOTE — Progress Notes (Signed)
Occupational Therapy Session Note  Patient Details  Name: [name] MRN: [id number] Date of Birth: 08/11/1960  Today's Date: 05/28/2015 OT Individual Time:  - 0800--0900  (60 min)  1st session                                        1300-1400  (60 min)  2nd session   Short Term Goals: Week 1:  OT Short Term Goal 1 (Week 1): Pt will complete all bathing with modified independent from shower level.  OT Short Term Goal 2 (Week 1): Pt will complete toileting and toilet transfers with modified independence. OT Short Term Goal 3 (Week 1): Pt will complete tub/shower transfers with supervision using shower seat.  OT Short Term Goal 4 (Week 1): Pt will complete simple meal prep at modified independent level using RW.       Skilled Therapeutic Interventions/Progress Updates:    Pt. Lying in bed.  Transferred from supine>EOB>sit to stand>amb with SBA to min steadying with RW.  Pt ambulated to retrieve clothes for shower.  Pt had one LOB while turning with RW.  Pt able to regain with mod assist.  Ambulated to shower bench and proceeded with bathing.  Ambulated with RW to bed to dress.  Pt sat EOB to don TEDs and shoes with one LoB to right while donning TEDs with min assist to recover.  Pt. Stood at sink to perform grooming with close SBA.    2nd session:   Engaged in functional balance with 2WW down to gym with SBA.  Engaged in hands and knees with balance on 3 limbs; Pt able to balance on 3 limbs but not 2 limbs.  Engaged in functional homemaking actitivities with pt making one item in microwave.  Provided instructional cues for passing objects and not carrying with RW.  Educated on walker bag.  Pt. Ambulated and made bed, washed dishes with SBA.  Ambulated back to room and left with all needs in reach.    Therapy Documentation Precautions:  Precautions Precautions: Fall Restrictions Weight Bearing Restrictions: No      Pain:  None  1st session        none    2nd session           See  Function Navigator for Current Functional Status.   Therapy/Group: Individual Therapy  Humberto Seals 05/28/2015, 7:46 AM

## 2015-05-28 NOTE — Progress Notes (Signed)
05/28/15 1817 nursing Dr. Wynn Banker made aware of patients blood sugar. No new orders. Continued to monitor.

## 2015-05-28 NOTE — Progress Notes (Signed)
Hypoglycemic Event  CBG: 37  Treatment: graham crackers/ juice/ dinner  Symptoms: none  Follow-up CBG: Time:1724 CBG Result:61  Possible Reasons for Event: unknown  Comments/MD notified:    Gwenyth Allegra

## 2015-05-29 LAB — GLUCOSE, CAPILLARY
GLUCOSE-CAPILLARY: 324 mg/dL — AB (ref 65–99)
GLUCOSE-CAPILLARY: 233 mg/dL — AB (ref 65–99)
Glucose-Capillary: 205 mg/dL — ABNORMAL HIGH (ref 65–99)

## 2015-05-29 MED ORDER — INSULIN ASPART 100 UNIT/ML ~~LOC~~ SOLN
0.0000 [IU] | Freq: Three times a day (TID) | SUBCUTANEOUS | Status: DC
  Administered 2015-05-29: 9 [IU] via SUBCUTANEOUS
  Administered 2015-05-29: 3 [IU] via SUBCUTANEOUS
  Administered 2015-05-30: 7 [IU] via SUBCUTANEOUS
  Administered 2015-05-30: 1 [IU] via SUBCUTANEOUS
  Administered 2015-05-30: 9 [IU] via SUBCUTANEOUS

## 2015-05-29 MED ORDER — INSULIN ASPART 100 UNIT/ML ~~LOC~~ SOLN
0.0000 [IU] | Freq: Every day | SUBCUTANEOUS | Status: DC
  Administered 2015-05-29: 2 [IU] via SUBCUTANEOUS

## 2015-05-29 NOTE — Progress Notes (Signed)
Inpatient Diabetes Program Recommendations  AACE/ADA: New Consensus Statement on Inpatient Glycemic Control (2015)  Target Ranges:  Prepandial:   less than 140 mg/dL      Peak postprandial:   less than 180 mg/dL (1-2 hours)      Critically ill patients:  140 - 180 mg/dL   Results for [name], [name] (MRN [id number]) as of 05/29/2015 09:40  Ref. Range 05/26/2015 06:47 05/26/2015 11:51 05/26/2015 16:40 05/26/2015 17:01 05/26/2015 17:45 05/26/2015 21:33  Glucose-Capillary Latest Ref Range: 65-99 mg/dL 409 (H) 811 (H) 58 (L) 44 (LL) 169 (H) 247 (H)   Results for [name], [name] (MRN [id number]) as of 05/29/2015 09:40  Ref. Range 05/27/2015 06:52 05/27/2015 11:29 05/27/2015 11:41 05/27/2015 12:38 05/27/2015 16:34 05/27/2015 22:51  Glucose-Capillary Latest Ref Range: 65-99 mg/dL 213 (H) 086 (HH) 578 (H) 487 (H) 66 165 (H)   Results for [name], [name] (MRN [id number]) as of 05/29/2015 09:40  Ref. Range 05/28/2015 06:54 05/28/2015 16:37 05/28/2015 17:24 05/28/2015 18:06 05/28/2015 21:50  Glucose-Capillary Latest Ref Range: 65-99 mg/dL 413 (H) 37 (LL) 61 (L) 104 (H) 167 (H)   Results for [name], [name] (MRN [id number]) as of 05/29/2015 09:40  Ref. Range 05/29/2015 06:22  Glucose-Capillary Latest Ref Range: 65-99 mg/dL 536 (H)    Admit with: CVA  History: DM, CAD, HTN  Home DM Meds: Evaristo Bury insulin (insulin degludec) 20 units QAM       Humalog 1-7 units tid per SSI  Current Insulin Orders: Levemir 18 units daily      Novolog Sensitive SSI (0-9 units) TID AC + HS      Novolog 7 units tidwc    -Upon review of the last several days worth of CBG results, I see that patient has been having issues with Hyperglycemia in the morning followed by Severe Hypoglycemic events after receiving large doses of Novolog.    -Patient is likely very Sensitive to insulin and may not tolerate such large doses of rapid-acting insulin.  -Note that Levemir increased to 18 units daily today- Agree with this insulin adjustment as patient appears to need more basal insulin  given her fasting glucose levels have been elevated for many days on lower doses of Levemir.  -Note Novolog SSI reduced to Sensitive scale today.  Agree with this adjustment as well.  -Also note that patient was mainly having issues with Severe Hypoglycemia in the afternoon after receiving large doses of Novolog in the morning and at lunch b/c glucose levels were so high.  I am hopeful that if we can get the right amount of basal insulin on board for patient that her glucose levels will even out and she will not require so much Novolog in the morning hopefully reducing her risk of Hypoglycemia in the afternoon.    MD- Do recommend that we decrease patient's Novolog Meal Coverage to 5 units tidwc    --Will follow patient during hospitalization--  Ambrose Finland RN, MSN, CDE Diabetes Coordinator Inpatient Glycemic Control Team Team Pager: [phone] (8a-5p)

## 2015-05-29 NOTE — Progress Notes (Signed)
Subjective/Complaints: Had hypoglycemic episode yesterday  ROS- no N/V, diarrhea, contipation, no CP or SOB Objective: Vital Signs: Blood pressure 116/63, pulse 85, temperature 98.4 F (36.9 C), temperature source Oral, resp. rate 16, height 5\' 4"  (1.626 m), weight 68.9 kg (151 lb 14.4 oz), last menstrual period 03/13/2011, SpO2 94 %. No results found. Results for orders placed or performed during the hospital encounter of 05/25/15 (from the past 72 hour(s))  Glucose, capillary     Status: Abnormal   Collection Time: 05/26/15 11:51 AM  Result Value Ref Range   Glucose-Capillary 198 (H) 65 - 99 mg/dL  Glucose, capillary     Status: Abnormal   Collection Time: 05/26/15  4:40 PM  Result Value Ref Range   Glucose-Capillary 58 (L) 65 - 99 mg/dL  Glucose, capillary     Status: Abnormal   Collection Time: 05/26/15  5:01 PM  Result Value Ref Range   Glucose-Capillary 44 (LL) 65 - 99 mg/dL   Comment 1 Notify RN   Glucose, capillary     Status: Abnormal   Collection Time: 05/26/15  5:45 PM  Result Value Ref Range   Glucose-Capillary 169 (H) 65 - 99 mg/dL  Glucose, capillary     Status: Abnormal   Collection Time: 05/26/15  9:33 PM  Result Value Ref Range   Glucose-Capillary 247 (H) 65 - 99 mg/dL  Glucose, capillary     Status: Abnormal   Collection Time: 05/27/15  6:52 AM  Result Value Ref Range   Glucose-Capillary 355 (H) 65 - 99 mg/dL   Comment 1 Notify RN   Glucose, capillary     Status: Abnormal   Collection Time: 05/27/15 11:29 AM  Result Value Ref Range   Glucose-Capillary 558 (HH) 65 - 99 mg/dL   Comment 1 Notify RN   Glucose, capillary     Status: Abnormal   Collection Time: 05/27/15 11:41 AM  Result Value Ref Range   Glucose-Capillary 531 (H) 65 - 99 mg/dL  Glucose, capillary     Status: Abnormal   Collection Time: 05/27/15 12:38 PM  Result Value Ref Range   Glucose-Capillary 487 (H) 65 - 99 mg/dL  Glucose, capillary     Status: None   Collection Time: 05/27/15   4:34 PM  Result Value Ref Range   Glucose-Capillary 66 65 - 99 mg/dL  Glucose, capillary     Status: Abnormal   Collection Time: 05/27/15 10:51 PM  Result Value Ref Range   Glucose-Capillary 165 (H) 65 - 99 mg/dL  Glucose, capillary     Status: Abnormal   Collection Time: 05/28/15  6:54 AM  Result Value Ref Range   Glucose-Capillary 243 (H) 65 - 99 mg/dL  Glucose, capillary     Status: Abnormal   Collection Time: 05/28/15  4:37 PM  Result Value Ref Range   Glucose-Capillary 37 (LL) 65 - 99 mg/dL   Comment 1 Notify RN   Glucose, capillary     Status: Abnormal   Collection Time: 05/28/15  5:24 PM  Result Value Ref Range   Glucose-Capillary 61 (L) 65 - 99 mg/dL   Comment 1 Notify RN   Glucose, capillary     Status: Abnormal   Collection Time: 05/28/15  6:06 PM  Result Value Ref Range   Glucose-Capillary 104 (H) 65 - 99 mg/dL   Comment 1 Notify RN   Glucose, capillary     Status: Abnormal   Collection Time: 05/28/15  9:50 PM  Result Value Ref Range  Glucose-Capillary 167 (H) 65 - 99 mg/dL  Glucose, capillary     Status: Abnormal   Collection Time: 05/29/15  6:22 AM  Result Value Ref Range   Glucose-Capillary 324 (H) 65 - 99 mg/dL   Comment 1 Notify RN       General: No acute distress Mood and affect are appropriate Eyes- non injected, keeps Left eye closed  Heart: Regular rate and rhythm no rubs murmurs or extra sounds Lungs: Clear to auscultation, breathing unlabored, no rales or wheezes Abdomen: Positive bowel sounds, soft nontender to palpation, nondistended Extremities: No clubbing, cyanosis, or edema Skin: No evidence of breakdown, no evidence of rash Neurologic: Cranial nerve IV palsy, motor strength is 5/5 in left  deltoid, bicep, tricep, grip,Bilateral hip flexor, knee extensors, ankle dorsiflexor and plantar flexor, 4/5 RUE with ataxia Sensory exam normal sensation to light touch and proprioception in bilateral upper and lower extremities Cerebellar exam normal  finger to nose to finger as well as heel to shin in bilateral upper and lower extremities Musculoskeletal: Full range of motion in all 4 extremities. No joint swelling   Assessment/Plan: 1. Functional deficits secondary to Left paracentral midbrain and pontine infarcts which require 3+ hours per day of interdisciplinary therapy in a comprehensive inpatient rehab setting. Physiatrist is providing close team supervision and 24 hour management of active medical problems listed below. Physiatrist and rehab team continue to assess barriers to discharge/monitor patient progress toward functional and medical goals. FIM: Function - Bathing Position: Shower Body parts bathed by patient: Right arm, Left arm, Chest, Abdomen, Front perineal area, Buttocks, Right upper leg, Left upper leg, Right lower leg, Left lower leg Bathing not applicable: Back Assist Level: Touching or steadying assistance(Pt > 75%)  Function- Upper Body Dressing/Undressing What is the patient wearing?: Pull over shirt/dress Pull over shirt/dress - Perfomed by patient: Thread/unthread right sleeve, Thread/unthread left sleeve, Put head through opening, Pull shirt over trunk Assist Level: Touching or steadying assistance(Pt > 75%) (one LOB) Function - Lower Body Dressing/Undressing What is the patient wearing?: Underwear, Pants, Socks, [employer] Position: Standing at [employer] - Performed by patient: Thread/unthread right underwear leg, Thread/unthread left underwear leg, Pull underwear up/down Pants- Performed by patient: Thread/unthread right pants leg, Thread/unthread left pants leg, Pull pants up/down Socks - Performed by patient: Don/doff right sock, Don/doff left sock Socks - Performed by helper: Don/doff right sock, Don/doff left sock Shoes - Performed by patient: Don/doff right shoe, Don/doff left shoe, Fasten right, Fasten left TED Hose - Performed by patient: Don/doff right TED hose, Don/doff left TED hose TED Hose  - Performed by helper: Don/doff left TED hose, Don/doff right TED hose Assist for lower body dressing: Touching or steadying assistance (Pt > 75%)  Function - Toileting Toileting steps completed by patient: Adjust clothing prior to toileting, Performs perineal hygiene, Adjust clothing after toileting Toileting Assistive Devices: Toilet aid Assist level: Touching or steadying assistance (Pt.75%)  Function - Toilet Transfers Toilet transfer assistive device: Elevated toilet seat/BSC over toilet Assist level to toilet: Touching or steadying assistance (Pt > 75%) Assist level from toilet: Touching or steadying assistance (Pt > 75%) Assist level to bedside commode (at bedside): Touching or steadying assistance (Pt > 75%) Assist level from bedside commode (at bedside): Touching or steadying assistance (Pt > 75%)  Function - Chair/bed transfer Chair/bed transfer method: Ambulatory Chair/bed transfer assist level: Supervision or verbal cues Chair/bed transfer assistive device: Armrests, Walker Chair/bed transfer details: Verbal cues for technique  Function - Locomotion: Wheelchair  Will patient use wheelchair at discharge?: No Type: Manual Max wheelchair distance: 100 Assist Level: Supervision or verbal cues Assist Level: Supervision or verbal cues Assist Level: Supervision or verbal cues Turns around,maneuvers to table,bed, and toilet,negotiates 3% grade,maneuvers on rugs and over doorsills: No Function - Locomotion: Ambulation Assistive device: Walker-rolling Max distance: 150 Assist level: Supervision or verbal cues Assist level: Supervision or verbal cues Assist level: Supervision or verbal cues Assist level: Supervision or verbal cues Assist level: Moderate assist (Pt 50 - 74%)  Function - Comprehension Comprehension: Auditory Comprehension assist level: Understands complex 90% of the time/cues 10% of the time  Function - Expression Expression: Verbal Expression assist level:  Set-up assist with assistive device  Function - Social Interaction Social Interaction assist level: Interacts appropriately with others - No medications needed.  Function - Problem Solving Problem solving assist level: Solves basic problems with no assist  Function - Memory Memory assist level: Recognizes or recalls 75 - 89% of the time/requires cueing 10 - 24% of the time Patient normally able to recall (first 3 days only): Current season, Staff names and faces, That he or she is in a hospital, Location of own room  Medical Problem List and Plan: 1.  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  DVT Prophylaxis/Anticoagulation: Subcutaneous Lovenox. Monitor platelet counts and any signs of bleeding 3. Pain Management: Cymbalta 30 mg daily 4. Mood: Celexa 20 mg daily. Provide emotional support 5. Neuropsych: This patient is capable of making decisions on her own behalf.  Does have higher level cognitive dysfx discussed with RN, ask NS to eval 6. Skin/Wound Care: Routine skin checks  7. Fluids/Electrolytes/Nutrition: Routine I&O with follow up labs 8. Hypertension. Coreg 6.25 mg twice a day, Lanoxin 0.0625 mg every other day.  9. Chronic systolic congestive heart failure with ischemic cardiomyopathy/NSTEMI. Lasix 20 mg daily. Monitor for any signs of fluid overload 10. Diabetes mellitus and peripheral neuropathy. Hemoglobin A1c 11.  , change SSI to sensitive, add hs coverage CBG (last 3)   Recent Labs  05/28/15 1806 05/28/15 2150 05/29/15 0622  GLUCAP 104* 167* 324*    Cont Novalog meal coverage to 7U Check blood sugars before meals and at bedtime. Diabetic teaching 11. Iron deficiency anemia. Resolved on Ferrous sulfate 650 mg daily. 12. Hyperlipidemia. Crestor- no myalgias   LOS (Days) 4 A FACE TO FACE EVALUATION WAS PERFORMED  Yogi Arther E 05/29/2015, 8:29 AM

## 2015-05-30 ENCOUNTER — Inpatient Hospital Stay (HOSPITAL_COMMUNITY): Payer: Medicare Other

## 2015-05-30 ENCOUNTER — Inpatient Hospital Stay (HOSPITAL_COMMUNITY): Payer: Medicare Other | Admitting: Occupational Therapy

## 2015-05-30 LAB — GLUCOSE, CAPILLARY
GLUCOSE-CAPILLARY: 266 mg/dL — AB (ref 65–99)
GLUCOSE-CAPILLARY: 315 mg/dL — AB (ref 65–99)
GLUCOSE-CAPILLARY: 324 mg/dL — AB (ref 65–99)
GLUCOSE-CAPILLARY: 38 mg/dL — AB (ref 65–99)
GLUCOSE-CAPILLARY: 459 mg/dL — AB (ref 65–99)
GLUCOSE-CAPILLARY: 64 mg/dL — AB (ref 65–99)
Glucose-Capillary: 74 mg/dL (ref 65–99)
Glucose-Capillary: 57 mg/dL — ABNORMAL LOW (ref 65–99)
Glucose-Capillary: 312 mg/dL — ABNORMAL HIGH (ref 65–99)
Glucose-Capillary: 361 mg/dL — ABNORMAL HIGH (ref 65–99)
Glucose-Capillary: 145 mg/dL — ABNORMAL HIGH (ref 65–99)
Glucose-Capillary: 21 mg/dL — CL (ref 65–99)
Glucose-Capillary: 25 mg/dL — CL (ref 65–99)

## 2015-05-30 MED ORDER — DEXTROSE 50 % IV SOLN
1.0000 | Freq: Once | INTRAVENOUS | Status: AC
  Administered 2015-05-30: 50 mL via INTRAVENOUS
  Filled 2015-05-30: qty 50

## 2015-05-30 MED ORDER — GLUCOSE 40 % PO GEL
ORAL | Status: AC
  Administered 2015-05-30: 37.5 g
  Filled 2015-05-30: qty 1

## 2015-05-30 MED ORDER — INSULIN DETEMIR 100 UNIT/ML ~~LOC~~ SOLN
25.0000 [IU] | Freq: Every day | SUBCUTANEOUS | Status: DC
Start: 2015-05-30 — End: 2015-05-31
  Administered 2015-05-30: 25 [IU] via SUBCUTANEOUS
  Filled 2015-05-30 (×2): qty 0.25

## 2015-05-30 NOTE — Progress Notes (Signed)
Subjective/Complaints: Elevated CBG today States she had no PT/OT yesterday  ROS- no N/V, diarrhea, contipation, no CP or SOB Objective: Vital Signs: Blood pressure 131/63, pulse 77, temperature 98 F (36.7 C), temperature source Oral, resp. rate 18, height 5\' 4"  (1.626 m), weight 69 kg (152 lb 1.9 oz), last menstrual period 03/13/2011, SpO2 100 %. No results found. Results for orders placed or performed during the hospital encounter of 05/25/15 (from the past 72 hour(s))  Glucose, capillary     Status: Abnormal   Collection Time: 05/27/15 11:29 AM  Result Value Ref Range   Glucose-Capillary 558 (HH) 65 - 99 mg/dL   Comment 1 Notify RN   Glucose, capillary     Status: Abnormal   Collection Time: 05/27/15 11:41 AM  Result Value Ref Range   Glucose-Capillary 531 (H) 65 - 99 mg/dL  Glucose, capillary     Status: Abnormal   Collection Time: 05/27/15 12:38 PM  Result Value Ref Range   Glucose-Capillary 487 (H) 65 - 99 mg/dL  Glucose, capillary     Status: None   Collection Time: 05/27/15  4:34 PM  Result Value Ref Range   Glucose-Capillary 66 65 - 99 mg/dL  Glucose, capillary     Status: Abnormal   Collection Time: 05/27/15 10:51 PM  Result Value Ref Range   Glucose-Capillary 165 (H) 65 - 99 mg/dL  Glucose, capillary     Status: Abnormal   Collection Time: 05/28/15  6:54 AM  Result Value Ref Range   Glucose-Capillary 243 (H) 65 - 99 mg/dL  Glucose, capillary     Status: Abnormal   Collection Time: 05/28/15  4:37 PM  Result Value Ref Range   Glucose-Capillary 37 (LL) 65 - 99 mg/dL   Comment 1 Notify RN   Glucose, capillary     Status: Abnormal   Collection Time: 05/28/15  5:24 PM  Result Value Ref Range   Glucose-Capillary 61 (L) 65 - 99 mg/dL   Comment 1 Notify RN   Glucose, capillary     Status: Abnormal   Collection Time: 05/28/15  6:06 PM  Result Value Ref Range   Glucose-Capillary 104 (H) 65 - 99 mg/dL   Comment 1 Notify RN   Glucose, capillary     Status:  Abnormal   Collection Time: 05/28/15  9:50 PM  Result Value Ref Range   Glucose-Capillary 167 (H) 65 - 99 mg/dL  Glucose, capillary     Status: Abnormal   Collection Time: 05/29/15  6:22 AM  Result Value Ref Range   Glucose-Capillary 324 (H) 65 - 99 mg/dL   Comment 1 Notify RN   Glucose, capillary     Status: Abnormal   Collection Time: 05/29/15  4:34 PM  Result Value Ref Range   Glucose-Capillary 233 (H) 65 - 99 mg/dL  Glucose, capillary     Status: Abnormal   Collection Time: 05/29/15  9:31 PM  Result Value Ref Range   Glucose-Capillary 205 (H) 65 - 99 mg/dL      General: No acute distress Mood and affect are appropriate Eyes- non injected, keeps Left eye closed  Heart: Regular rate and rhythm no rubs murmurs or extra sounds Lungs: Clear to auscultation, breathing unlabored, no rales or wheezes Abdomen: Positive bowel sounds, soft nontender to palpation, nondistended Extremities: No clubbing, cyanosis, or edema Skin: No evidence of breakdown, no evidence of rash Neurologic: Cranial nerve IV palsy, motor strength is 5/5 in left  deltoid, bicep, tricep, grip,Bilateral hip flexor, knee extensors,  ankle dorsiflexor and plantar flexor, 4/5 RUE with ataxia Sensory exam normal sensation to light touch and proprioception in bilateral upper and lower extremities Cerebellar exam normal finger to nose to finger as well as heel to shin in bilateral upper and lower extremities Musculoskeletal: Full range of motion in all 4 extremities. No joint swelling   Assessment/Plan: 1. Functional deficits secondary to Left paracentral midbrain and pontine infarcts which require 3+ hours per day of interdisciplinary therapy in a comprehensive inpatient rehab setting. Physiatrist is providing close team supervision and 24 hour management of active medical problems listed below. Physiatrist and rehab team continue to assess barriers to discharge/monitor patient progress toward functional and medical  goals. FIM: Function - Bathing Position: Shower Body parts bathed by patient: Right arm, Left arm, Chest, Abdomen, Front perineal area, Buttocks, Right upper leg, Left upper leg, Right lower leg, Left lower leg Bathing not applicable: Back Assist Level: Touching or steadying assistance(Pt > 75%)  Function- Upper Body Dressing/Undressing What is the patient wearing?: Pull over shirt/dress Pull over shirt/dress - Perfomed by patient: Thread/unthread right sleeve, Thread/unthread left sleeve, Put head through opening, Pull shirt over trunk Assist Level: Touching or steadying assistance(Pt > 75%) (one LOB) Function - Lower Body Dressing/Undressing What is the patient wearing?: Underwear, Pants, Socks, [employer] Position: Standing at [employer] - Performed by patient: Thread/unthread right underwear leg, Thread/unthread left underwear leg, Pull underwear up/down Pants- Performed by patient: Thread/unthread right pants leg, Thread/unthread left pants leg, Pull pants up/down Socks - Performed by patient: Don/doff right sock, Don/doff left sock Socks - Performed by helper: Don/doff right sock, Don/doff left sock Shoes - Performed by patient: Don/doff right shoe, Don/doff left shoe, Fasten right, Fasten left TED Hose - Performed by patient: Don/doff right TED hose, Don/doff left TED hose TED Hose - Performed by helper: Don/doff left TED hose, Don/doff right TED hose Assist for lower body dressing: Touching or steadying assistance (Pt > 75%)  Function - Toileting Toileting steps completed by patient: Adjust clothing prior to toileting, Performs perineal hygiene, Adjust clothing after toileting Toileting Assistive Devices: Toilet aid Assist level: Touching or steadying assistance (Pt.75%)  Function - Toilet Transfers Toilet transfer assistive device: Elevated toilet seat/BSC over toilet Assist level to toilet: Touching or steadying assistance (Pt > 75%) Assist level from toilet: Touching or  steadying assistance (Pt > 75%) Assist level to bedside commode (at bedside): Touching or steadying assistance (Pt > 75%) Assist level from bedside commode (at bedside): Touching or steadying assistance (Pt > 75%)  Function - Chair/bed transfer Chair/bed transfer method: Ambulatory Chair/bed transfer assist level: Supervision or verbal cues Chair/bed transfer assistive device: Armrests, Walker Chair/bed transfer details: Verbal cues for technique  Function - Locomotion: Wheelchair Will patient use wheelchair at discharge?: No Type: Manual Max wheelchair distance: 100 Assist Level: Supervision or verbal cues Assist Level: Supervision or verbal cues Assist Level: Supervision or verbal cues Turns around,maneuvers to table,bed, and toilet,negotiates 3% grade,maneuvers on rugs and over doorsills: No Function - Locomotion: Ambulation Assistive device: Walker-rolling Max distance: 150 Assist level: Supervision or verbal cues Assist level: Supervision or verbal cues Assist level: Supervision or verbal cues Assist level: Supervision or verbal cues Assist level: Moderate assist (Pt 50 - 74%)  Function - Comprehension Comprehension: Auditory Comprehension assist level: Understands complex 90% of the time/cues 10% of the time  Function - Expression Expression: Verbal Expression assist level: Set-up assist with assistive device  Function - Social Interaction Social Interaction assist level: Interacts appropriately with  others - No medications needed.  Function - Problem Solving Problem solving assist level: Solves basic problems with no assist  Function - Memory Memory assist level: Recognizes or recalls 75 - 89% of the time/requires cueing 10 - 24% of the time Patient normally able to recall (first 3 days only): Current season, Staff names and faces, That he or she is in a hospital, Location of own room  Medical Problem List and Plan: 1.  Diplopia, gait deficits secondary to left  paracentral-midbrain and posterior left paracentral pontine infarct- Cont CIR, pt reports no therapy yesterday which appears accurate 2.  DVT Prophylaxis/Anticoagulation: Subcutaneous Lovenox. Monitor platelet counts and any signs of bleeding 3. Pain Management: Cymbalta 30 mg daily 4. Mood: Celexa 20 mg daily. Provide emotional support 5. Neuropsych: This patient is capable of making decisions on her own behalf.  Does have higher level cognitive dysfx discussed with RN, ask NS to eval 6. Skin/Wound Care: Routine skin checks  7. Fluids/Electrolytes/Nutrition: Routine I&O with follow up labs 8. Hypertension. Coreg 6.25 mg twice a day, Lanoxin 0.0625 mg every other day.  9. Chronic systolic congestive heart failure with ischemic cardiomyopathy/NSTEMI. Lasix 20 mg daily. Monitor for any signs of fluid overload 10. Diabetes mellitus and peripheral neuropathy. Hemoglobin A1c 11.  , received 2 U hs coverage CBG (last 3)   Recent Labs  05/29/15 0622 05/29/15 1634 05/29/15 2131  GLUCAP 324* 233* 205*    Cont Novalog meal coverage to 7U, Increase Lantus to 25U Check blood sugars before meals and at bedtime. Diabetic teaching 11. Iron deficiency anemia. Resolved on Ferrous sulfate 650 mg daily. 12. Hyperlipidemia. Crestor- no myalgias   LOS (Days) 5 A FACE TO FACE EVALUATION WAS PERFORMED  KIRSTEINS,ANDREW E 05/30/2015, 7:26 AM

## 2015-05-30 NOTE — Progress Notes (Signed)
Pt had BS of 459 at 1150. Notified Harvel Ricks, PA. Orders were to give the dosage of insulin required and monitor. No order for blood work. Will continue to monitor pt.

## 2015-05-30 NOTE — Progress Notes (Signed)
Physical Therapy Session Note  Patient Details  Name: [name] MRN: [id number] Date of Birth: July 16, 1960  Today's Date: 05/30/2015 PT Individual Time: 0800-0900 PT Individual Time Calculation (min): 60 min   Short Term Goals: Week 1:  PT Short Term Goal 1 (Week 1): = LTGs due ELOS  Skilled Therapeutic Interventions/Progress Updates:    While pt finishing breakfast, discussed goals and d/c planning. Session focused on addressing functional transfers, standing balance, endurance, curb step negotiation, neuro re-ed for balance and weightshifting on Kinetron without UE support to maintain balance with cues to shift to L, and functional gait with RW. Pt able to don shirt and shoes EOB with supervision for balance. Transfers throughout session with supervision to steady assist including supervision for toilet transfers with verbal cues for hand placement during sit <> stands. Pt with a few small LOBs to R during gait requiring min assist to recover and verbal cues for safe negotiation of obstacles with RW in small spaces simulating home environment. Dynamic gait training with and without assistive device through obstacles to challenge balance and simulate home and community mobility. Pt's mom arrived for part of session and reviewed home recommendations (use of RW) for family education as well as home set-up though pt's mom only able to provide supervision due to her health state as well. Both deny concerns in regards to d/c home towards end of the week.   Therapy Documentation Precautions:  Precautions Precautions: Fall Restrictions Weight Bearing Restrictions: No  Pain:  Denies pain.     See Function Navigator for Current Functional Status.   Therapy/Group: Individual Therapy  Karolee Stamps Darrol Poke, PT, DPT  05/30/2015, 9:05 AM

## 2015-05-30 NOTE — Progress Notes (Signed)
Occupational Therapy Session Note  Patient Details  Name: [name] MRN: [id number] Date of Birth: Aug 26, 1960  Today's Date: 05/30/2015 OT Individual Time: 4098-1191 OT Individual Time Calculation (min): 87 min    Skilled Therapeutic Interventions/Progress Updates:    Pt performed toilet transfer and toileting to start session using the RW and close supervision.  Ambulated from the toilet to the sink and stood as well with supervision to complete washing her hands.  Next pt ambulated to the orthopedic gym using the RW for work on RUE coordination and standing balance.  Incorporated the Wii system to do this.  Pt with moderate difficulty coordinating RUE use with the Wii.  Needed mod assist to activate buttons in order to perform activity.  Noted frequent LOB posteriorly and anteriorly with pt standing unsupported, requiring min assist to correct.  Pt was able to use the LUE for holding controller and activating buttons with increased time and supervision.  Pt stood for intervals of 1-3 mins before sitting to rest during the alternating of turns.  When finished, pt ambulated all the way back down to her room with the RW, without stopping at close supervision level.  Pt transferred to supine with supervision.  Call button within reach.   Therapy Documentation Precautions:  Precautions Precautions: Fall Restrictions Weight Bearing Restrictions: No  Pain: Pain Assessment Pain Assessment: No/denies pain ADL: See Function Navigator for Current Functional Status.  Therapy/Group: Individual Therapy  Lennis Korb OTR/L 05/30/2015, 3:52 PM

## 2015-05-30 NOTE — Progress Notes (Signed)
Occupational Therapy Session Note  Patient Details  Name: [name] MRN: [id number] Date of Birth: [birth year]  Today's Date: 05/30/2015 OT Individual Time: 7829-5621 OT Individual Time Calculation (min): 55 min    Short Term Goals: Week 1:  OT Short Term Goal 1 (Week 1): Pt will complete all bathing with modified independent from shower level.  OT Short Term Goal 2 (Week 1): Pt will complete toileting and toilet transfers with modified independence. OT Short Term Goal 3 (Week 1): Pt will complete tub/shower transfers with supervision using shower seat.  OT Short Term Goal 4 (Week 1): Pt will complete simple meal prep at modified independent level using RW.   Skilled Therapeutic Interventions/Progress Updates:    Pt ambulated to the [facility] gym with supervision using the RW.  She continues to need mod instructional cueing for hand placement with sit to stand as she always wants to pull up on the walker with at least one UE.  In the gym had pt work on 3 dimensional puzzle in standing.  Pt needed mod instructional cueing to reach and pick up pieces with the RUE instead of using the LUE.  Positioned them to the side as well so pt would have to bend down and to the right to retrieve them.  She was able to tolerate standing for 20 mins before needing a rest break.  Oxygen sats 98% on room air with HR 95. Progressed to standing activity in the dayroom using the RUE to toss and pick up Boce ball.  She was able to ambulate around with the walker and min guard assist.  Mod instructional cueing for positioning of walker while reaching down to pick up the ball from the floor.  Min assist for maintaining balance while reaching.  Finished session with ambulation back to the room.  Pt left in wheelchair with her mother and pastor present for session.    Therapy Documentation Precautions:  Precautions Precautions: Fall Restrictions Weight Bearing Restrictions: No  Pain: Pain Assessment Pain Assessment:  No/denies pain ADL: See Function Navigator for Current Functional Status.   Therapy/Group: Individual Therapy  Miesha Bachmann OTR/L 05/30/2015, 12:16 PM

## 2015-05-31 ENCOUNTER — Inpatient Hospital Stay (HOSPITAL_COMMUNITY): Payer: Medicare Other | Admitting: Occupational Therapy

## 2015-05-31 ENCOUNTER — Inpatient Hospital Stay (HOSPITAL_COMMUNITY): Payer: Medicare Other | Admitting: Physical Therapy

## 2015-05-31 LAB — GLUCOSE, CAPILLARY
GLUCOSE-CAPILLARY: 486 mg/dL — AB (ref 65–99)
GLUCOSE-CAPILLARY: 495 mg/dL — AB (ref 65–99)
GLUCOSE-CAPILLARY: 559 mg/dL — AB (ref 65–99)
GLUCOSE-CAPILLARY: 135 mg/dL — AB (ref 65–99)
Glucose-Capillary: 230 mg/dL — ABNORMAL HIGH (ref 65–99)
Glucose-Capillary: 385 mg/dL — ABNORMAL HIGH (ref 65–99)
Glucose-Capillary: 354 mg/dL — ABNORMAL HIGH (ref 65–99)
Glucose-Capillary: 444 mg/dL — ABNORMAL HIGH (ref 65–99)
Glucose-Capillary: 485 mg/dL — ABNORMAL HIGH (ref 65–99)
Glucose-Capillary: 387 mg/dL — ABNORMAL HIGH (ref 65–99)
Glucose-Capillary: 347 mg/dL — ABNORMAL HIGH (ref 65–99)
Glucose-Capillary: 218 mg/dL — ABNORMAL HIGH (ref 65–99)
Glucose-Capillary: 204 mg/dL — ABNORMAL HIGH (ref 65–99)

## 2015-05-31 LAB — BASIC METABOLIC PANEL
ANION GAP: 12 (ref 5–15)
BUN: 24 mg/dL — ABNORMAL HIGH (ref 6–20)
CHLORIDE: 99 mmol/L — AB (ref 101–111)
CO2: 26 mmol/L (ref 22–32)
Calcium: 9.9 mg/dL (ref 8.9–10.3)
Creatinine, Ser: 1.24 mg/dL — ABNORMAL HIGH (ref 0.44–1.00)
GFR calc non Af Amer: 48 mL/min — ABNORMAL LOW (ref >60)
GFR, EST AFRICAN AMERICAN: 55 mL/min — AB (ref >60)
Glucose, Bld: 504 mg/dL — ABNORMAL HIGH (ref 65–99)
POTASSIUM: 4.2 mmol/L (ref 3.5–5.1)
Sodium: 137 mmol/L (ref 135–145)

## 2015-05-31 MED ORDER — INSULIN ASPART 100 UNIT/ML ~~LOC~~ SOLN: 7.0000 [IU] | Freq: Two times a day (BID) | SUBCUTANEOUS | Status: DC

## 2015-05-31 MED ORDER — INSULIN ASPART 100 UNIT/ML ~~LOC~~ SOLN
3.0000 [IU] | Freq: Three times a day (TID) | SUBCUTANEOUS | Status: DC
  Administered 2015-05-31 – 2015-06-04 (×11): 3 [IU] via SUBCUTANEOUS

## 2015-05-31 MED ORDER — INSULIN ASPART 100 UNIT/ML ~~LOC~~ SOLN
2.0000 [IU] | Freq: Three times a day (TID) | SUBCUTANEOUS | Status: DC
  Administered 2015-06-01: 5 [IU] via SUBCUTANEOUS
  Administered 2015-06-01: 2 [IU] via SUBCUTANEOUS
  Administered 2015-06-01: 5 [IU] via SUBCUTANEOUS
  Administered 2015-06-02: 4 [IU] via SUBCUTANEOUS
  Administered 2015-06-02 (×2): 5 [IU] via SUBCUTANEOUS
  Administered 2015-06-03: 2 [IU] via SUBCUTANEOUS
  Administered 2015-06-03: 4 [IU] via SUBCUTANEOUS
  Administered 2015-06-04: 2 [IU] via SUBCUTANEOUS

## 2015-05-31 MED ORDER — INSULIN ASPART 100 UNIT/ML ~~LOC~~ SOLN
10.0000 [IU] | Freq: Once | SUBCUTANEOUS | Status: AC
  Administered 2015-05-31: 10 [IU] via SUBCUTANEOUS

## 2015-05-31 MED ORDER — INSULIN DEGLUDEC 200 UNIT/ML ~~LOC~~ SOPN
20.0000 [IU] | PEN_INJECTOR | Freq: Every day | SUBCUTANEOUS | Status: DC
  Administered 2015-06-01 – 2015-06-02 (×2): 20 [IU] via SUBCUTANEOUS
  Filled 2015-05-31 (×5): qty 9

## 2015-05-31 MED ORDER — INSULIN DETEMIR 100 UNIT/ML ~~LOC~~ SOLN
20.0000 [IU] | Freq: Once | SUBCUTANEOUS | Status: AC
  Administered 2015-05-31: 20 [IU] via SUBCUTANEOUS
  Filled 2015-05-31: qty 0.2

## 2015-05-31 MED ORDER — SODIUM CHLORIDE 0.9 % IV SOLN
75.0000 mL/h | INTRAVENOUS | Status: DC
  Administered 2015-05-31: 75 mL/h via INTRAVENOUS

## 2015-05-31 NOTE — Progress Notes (Signed)
Physical Therapy Session Note  Patient Details  Name: [name] MRN: [id number] Date of Birth: 07/27/1960  Today's Date: 05/31/2015 PT Individual Time: 1515-1600 PT Individual Time Calculation (min): 45 min   Short Term Goals: Week 1:  PT Short Term Goal 1 (Week 1): = LTGs due ELOS  Skilled Therapeutic Interventions/Progress Updates:    Pt received resting in w/c and agreeable to therapy session.  Session focus on gait training up/down ramp and curb with RW, safe functional transfers, coordination, dual tasking, balance, NMR, and balance.  Pt performs sit<>stand transfers with supervision and occasional verbal cues for hand placement during transfer.  Gait 2x150' to/from gym with RW and supervision.  PT instructed patient in negotiation of ramp and curb x2 with RW and close supervision with verbal cues for stepping up to the edge of the curb before placing walker down.  PT instructed patient in step ups on 6" step with mod assist for balance 6 sets of 2 reps.  PT instructed patient in dynamic balance activity reaching for/matching cards on mirror progressing from single UE support to no UE support on walker.  Pt performed Nustep at level 2 resistance x5 minutes and x4 minutes for endurance and reciprocal movement.  Pt returned to room with RW and performed toileting with assist described in function tab.  Pt returned to w/c at end of session and left sitting with call bell in reach and needs met.   Therapy Documentation Precautions:  Precautions Precautions: Fall Restrictions Weight Bearing Restrictions: No General:   Vital Signs: Therapy Vitals Temp: 98 F (36.7 C) Temp Source: Oral Pulse Rate: 74 Resp: 18 BP: 114/71 mmHg Patient Position (if appropriate): Lying Oxygen Therapy SpO2: 100 % O2 Device: Not Delivered Pain: Pain Assessment Pain Assessment: No/denies pain Mobility:   Locomotion :    Trunk/Postural Assessment :    Balance:   Exercises:   Other Treatments:      See Function Navigator for Current Functional Status.   Therapy/Group: Individual Therapy  Caitlin E Penven-Crew 05/31/2015, 4:15 PM  

## 2015-05-31 NOTE — Progress Notes (Signed)
Physical Therapy Session Note  Patient Details  Name: [name] MRN: [id number] Date of Birth: 1961/03/14  Today's Date: 05/31/2015 PT Individual Time: 1020-1130 PT Individual Time Calculation (min): 70 min    Skilled Therapeutic Interventions/Progress Updates:    Pt with no c/o pain.  Pt gait > 150 x 2 in controlled environment with supervision.  Gait on carpet to simulate home environment with supervision with RW.  Gait through obstacles and stepping over obstacles with close supervision, cues for safety and not running into obstacles.  Gait with [facility] as pt states this is what she used at home, pt does not put cane down often during gait but holds it in the air.  PT recommended pt use RW at all times at home and in the community, pt agrees.  Standing balance with ball toss and ball kick with occasional min A to correct LOB, pt with very delayed balance reactions.  Berg balance test performed with pt scoring 26/56, high fall risk. Pt aware of risk of falls and agreeable to using RW at all times.  Therapy Documentation     Balance: Berg Balance Test Sit to Stand: Able to stand without using hands and stabilize independently Standing Unsupported: Able to stand 2 minutes with supervision Sitting with Back Unsupported but Feet Supported on Floor or Stool: Able to sit safely and securely 2 minutes Stand to Sit: Sits safely with minimal use of hands Transfers: Able to transfer with verbal cueing and /or supervision Standing Unsupported with Eyes Closed: Able to stand 10 seconds with supervision Standing Ubsupported with Feet Together: Needs help to attain position and unable to hold for 15 seconds From Standing, Reach Forward with Outstretched Arm: Reaches forward but needs supervision From Standing Position, Pick up Object from Floor: Able to pick up shoe, needs supervision From Standing Position, Turn to Look Behind Over each Shoulder: Needs assist to keep from losing balance and falling Turn  360 Degrees: Needs close supervision or verbal cueing Standing Unsupported, Alternately Place Feet on Step/Stool: Needs assistance to keep from falling or unable to try Standing Unsupported, One Foot in Front: Loses balance while stepping or standing Standing on One Leg: Tries to lift leg/unable to hold 3 seconds but remains standing independently Total Score: 26   See Function Navigator for Current Functional Status.   Therapy/Group: Individual Therapy  Gwendolin Briel 05/31/2015, 11:46 AM

## 2015-05-31 NOTE — Progress Notes (Signed)
Hypoglycemic Event  CBG:25  Treatment:d50 1 amp, 1 glutose gel   Symptoms: Pale and Sweaty  Follow-up CBG: Time:2245 CBG Result:230  Possible Reasons for Event: medication regimen  Comments/MD notified:Pamela Talbot Grumbling, Jerel Sardina R

## 2015-05-31 NOTE — Progress Notes (Signed)
Occupational Therapy Session Note  Patient Details  Name: [name] MRN: [id number] Date of Birth: 24-Feb-1961  Today's Date: 05/31/2015 OT Individual Time: 1478-2956 OT Individual Time Calculation (min): 60 min    Short Term Goals: Week 1:  OT Short Term Goal 1 (Week 1): Pt will complete all bathing with modified independent from shower level.  OT Short Term Goal 2 (Week 1): Pt will complete toileting and toilet transfers with modified independence. OT Short Term Goal 3 (Week 1): Pt will complete tub/shower transfers with supervision using shower seat.  OT Short Term Goal 4 (Week 1): Pt will complete simple meal prep at modified independent level using RW.   Skilled Therapeutic Interventions/Progress Updates:    Pt completed bathing and dressing to start session.  Close supervision for ambulation around the room to gather clothing, towels, and washcloth with use of the RW.  Mod instructional cueing to stay inside of the walker and turn it, so it stays in front of her when reaching for items or placing clothing on her bed in preparation for dressing.  She was able to complete all bathing with supervision sit to stand and then most of bathing with close supervision as well.  She needed min assist to pull her shirt down in the back secondary to it rolling up and pt not having the AROM in her shoulders for internal rotation to reach it.  She was able to complete grooming tasks in standing with close supervision before ambulating out to the BI gym to finish session.  Had her stand and use the [employer] for balance as well as RUE coordination.  Pt with 2.5-2.75 reaction time for locating and pushing out the lights.  Pt returned to room after single trial and sat in wheelchair beside of the bed.  Call button and phone in reach.      Therapy Documentation Precautions:  Precautions Precautions: Fall Restrictions Weight Bearing Restrictions: No  Pain: Pain Assessment Pain Assessment: No/denies  pain ADL: See Function Navigator for Current Functional Status.   Therapy/Group: Individual Therapy  Jenita Rayfield OTR/L 05/31/2015, 9:44 AM

## 2015-05-31 NOTE — Progress Notes (Addendum)
Inpatient Diabetes Program Recommendations  AACE/ADA: New Consensus Statement on Inpatient Glycemic Control (2015)  Target Ranges:  Prepandial:   less than 140 mg/dL      Peak postprandial:   less than 180 mg/dL (1-2 hours)      Critically ill patients:  140 - 180 mg/dL   Spoke with Dr. Stephen Souths office [facility]) and spoke with Tarry. PatieCristine Polioe following regimen at home: Tresiba 20 daily Humalog 2-3 units meal coverage Humalog Sliding Scale: <150        1 unit 151-200   2 units 201-250   3 units 251-300   4 units 301-350   5 units >350        7 units  Patient saw Dr. Evlyn Kanner on Apr 04, 2015. At that time he noted poor control and that the mother helped her manage her glucose at home. She came in the office with a 43 glucose. A1c: August 2016 10.6% March 2015 8.6%  There is not an A1c that is lower than the 8.6% for Dr. Evlyn Kanner. Dr. Evlyn Kanner will be notified of patient in the hospital  When he returns to the office. He was not there today.   1134 am spoke with Dr. Doroteo Bradford on phone wants me to notify PA to consult Endocrinology.  Thanks,  Catherine Deem RN, MSN, [facility] Inpatient Diabetes Coordinator Team Pager [phone] (8a-5p)

## 2015-05-31 NOTE — Progress Notes (Signed)
Subjective/Complaints: Hypoglycemic episode yesterday, elevated this am, discussed plan with pt States she had no PT/OT yesterday  ROS- no N/V, diarrhea, contipation, no CP or SOB Objective: Vital Signs: Blood pressure 131/66, pulse 77, temperature 98.5 F (36.9 C), temperature source Oral, resp. rate 16, height  (1.626 m), weight 68.5 kg (151 lb 0.2 oz), last menstrual period 03/13/2011, SpO2 100 %. No results found. Results for orders placed or performed during the hospital encounter of 05/25/15 (from the past 72 hour(s))  Glucose, capillary     Status: Abnormal   Collection Time: 05/28/15 11:30 AM  Result Value Ref Range   Glucose-Capillary 312 (H) 65 - 99 mg/dL   Comment 1 Notify RN   Glucose, capillary     Status: Abnormal   Collection Time: 05/28/15  4:37 PM  Result Value Ref Range   Glucose-Capillary 37 (LL) 65 - 99 mg/dL   Comment 1 Notify RN   Glucose, capillary     Status: Abnormal   Collection Time: 05/28/15  4:59 PM  Result Value Ref Range   Glucose-Capillary 38 (LL) 65 - 99 mg/dL   Comment 1 Notify RN   Glucose, capillary     Status: Abnormal   Collection Time: 05/28/15  5:24 PM  Result Value Ref Range   Glucose-Capillary 61 (L) 65 - 99 mg/dL   Comment 1 Notify RN   Glucose, capillary     Status: Abnormal   Collection Time: 05/28/15  6:06 PM  Result Value Ref Range   Glucose-Capillary 104 (H) 65 - 99 mg/dL   Comment 1 Notify RN   Glucose, capillary     Status: Abnormal   Collection Time: 05/28/15  9:50 PM  Result Value Ref Range   Glucose-Capillary 167 (H) 65 - 99 mg/dL  Glucose, capillary     Status: Abnormal   Collection Time: 05/29/15  3:03 AM  Result Value Ref Range   Glucose-Capillary 315 (H) 65 - 99 mg/dL   Comment 1 Notify RN   Glucose, capillary     Status: Abnormal   Collection Time: 05/29/15  6:22 AM  Result Value Ref Range   Glucose-Capillary 324 (H) 65 - 99 mg/dL   Comment 1 Notify RN   Glucose, capillary     Status: Abnormal    Collection Time: 05/29/15 11:24 AM  Result Value Ref Range   Glucose-Capillary 361 (H) 65 - 99 mg/dL  Glucose, capillary     Status: Abnormal   Collection Time: 05/29/15  4:34 PM  Result Value Ref Range   Glucose-Capillary 233 (H) 65 - 99 mg/dL  Glucose, capillary     Status: Abnormal   Collection Time: 05/29/15  9:31 PM  Result Value Ref Range   Glucose-Capillary 205 (H) 65 - 99 mg/dL  Glucose, capillary     Status: Abnormal   Collection Time: 05/30/15  7:13 AM  Result Value Ref Range   Glucose-Capillary 324 (H) 65 - 99 mg/dL  Glucose, capillary     Status: Abnormal   Collection Time: 05/30/15 11:48 AM  Result Value Ref Range   Glucose-Capillary 459 (H) 65 - 99 mg/dL   Comment 1 Notify RN   Glucose, capillary     Status: Abnormal   Collection Time: 05/30/15  4:46 PM  Result Value Ref Range   Glucose-Capillary 145 (H) 65 - 99 mg/dL   Comment 1 Notify RN   Glucose, capillary     Status: Abnormal   Collection Time: 05/30/15  9:30 PM  Result  Value Ref Range   Glucose-Capillary 21 (LL) 65 - 99 mg/dL  Glucose, capillary     Status: Abnormal   Collection Time: 05/30/15 10:13 PM  Result Value Ref Range   Glucose-Capillary 25 (LL) 65 - 99 mg/dL  Glucose, capillary     Status: Abnormal   Collection Time: 05/30/15 10:52 PM  Result Value Ref Range   Glucose-Capillary 230 (H) 65 - 99 mg/dL  Glucose, capillary     Status: Abnormal   Collection Time: 05/30/15 11:41 PM  Result Value Ref Range   Glucose-Capillary 266 (H) 65 - 99 mg/dL  Glucose, capillary     Status: Abnormal   Collection Time: 05/31/15  3:33 AM  Result Value Ref Range   Glucose-Capillary 385 (H) 65 - 99 mg/dL  Glucose, capillary     Status: Abnormal   Collection Time: 05/31/15  6:58 AM  Result Value Ref Range   Glucose-Capillary 354 (H) 65 - 99 mg/dL      General: No acute distress Mood and affect are appropriate Eyes- non injected, keeps Left eye closed  Heart: Regular rate and rhythm no rubs murmurs or extra  sounds Lungs: Clear to auscultation, breathing unlabored, no rales or wheezes Abdomen: Positive bowel sounds, soft nontender to palpation, nondistended Extremities: No clubbing, cyanosis, or edema Skin: No evidence of breakdown, no evidence of rash Neurologic: Cranial nerve IV palsy, motor strength is 5/5 in left  deltoid, bicep, tricep, grip,Bilateral hip flexor, knee extensors, ankle dorsiflexor and plantar flexor, 4/5 RUE with ataxia Sensory exam normal sensation to light touch and proprioception in bilateral upper and lower extremities Cerebellar exam normal finger to nose to finger as well as heel to shin in bilateral upper and lower extremities Musculoskeletal: Full range of motion in all 4 extremities. No joint swelling   Assessment/Plan: 1. Functional deficits secondary to Left paracentral midbrain and pontine infarcts which require 3+ hours per day of interdisciplinary therapy in a comprehensive inpatient rehab setting. Physiatrist is providing close team supervision and 24 hour management of active medical problems listed below. Physiatrist and rehab team continue to assess barriers to discharge/monitor patient progress toward functional and medical goals. FIM: Function - Bathing Position: Shower Body parts bathed by patient: Right arm, Left arm, Chest, Abdomen, Front perineal area, Buttocks, Right upper leg, Left upper leg, Right lower leg, Left lower leg Bathing not applicable: Back Assist Level: Touching or steadying assistance(Pt > 75%)  Function- Upper Body Dressing/Undressing What is the patient wearing?: Pull over shirt/dress Pull over shirt/dress - Perfomed by patient: Thread/unthread right sleeve, Thread/unthread left sleeve, Put head through opening, Pull shirt over trunk Assist Level: Touching or steadying assistance(Pt > 75%) (one LOB) Function - Lower Body Dressing/Undressing What is the patient wearing?: Underwear, Pants, Socks, [employer] Position: Standing at  [employer] - Performed by patient: Thread/unthread right underwear leg, Thread/unthread left underwear leg, Pull underwear up/down Pants- Performed by patient: Thread/unthread right pants leg, Thread/unthread left pants leg, Pull pants up/down Socks - Performed by patient: Don/doff right sock, Don/doff left sock Socks - Performed by helper: Don/doff right sock, Don/doff left sock Shoes - Performed by patient: Don/doff right shoe, Don/doff left shoe, Fasten right, Fasten left TED Hose - Performed by patient: Don/doff right TED hose, Don/doff left TED hose TED Hose - Performed by helper: Don/doff left TED hose, Don/doff right TED hose Assist for footwear: Supervision/touching assist Assist for lower body dressing: Touching or steadying assistance (Pt > 75%)  Function - Toileting Toileting steps completed by  patient: Adjust clothing prior to toileting, Performs perineal hygiene, Adjust clothing after toileting Toileting Assistive Devices: Toilet aid Assist level: Supervision or verbal cues  Function - Archivist transfer assistive device: Walker, Elevated toilet seat/BSC over toilet Assist level to toilet: Supervision or verbal cues Assist level from toilet: Supervision or verbal cues Assist level to bedside commode (at bedside): Touching or steadying assistance (Pt > 75%) Assist level from bedside commode (at bedside): Touching or steadying assistance (Pt > 75%)  Function - Chair/bed transfer Chair/bed transfer method: Ambulatory Chair/bed transfer assist level: Supervision or verbal cues Chair/bed transfer assistive device: Armrests, Walker Chair/bed transfer details: Verbal cues for technique  Function - Locomotion: Wheelchair Will patient use wheelchair at discharge?: No Type: Manual Max wheelchair distance: 100 Assist Level: Supervision or verbal cues Assist Level: Supervision or verbal cues Assist Level: Supervision or verbal cues Turns around,maneuvers to  table,bed, and toilet,negotiates 3% grade,maneuvers on rugs and over doorsills: No Function - Locomotion: Ambulation Assistive device: Walker-rolling Max distance: 150 Assist level: Supervision or verbal cues Assist level: Supervision or verbal cues Assist level: Touching or steadying assistance (Pt > 75%) (supervision except steady assist for 1 LOB) Assist level: Supervision or verbal cues Assist level: Moderate assist (Pt 50 - 74%)  Function - Comprehension Comprehension: Auditory Comprehension assist level: Understands complex 90% of the time/cues 10% of the time  Function - Expression Expression: Verbal Expression assist level: Expresses basic needs/ideas: With no assist  Function - Social Interaction Social Interaction assist level: Interacts appropriately 75 - 89% of the time - Needs redirection for appropriate language or to initiate interaction.  Function - Problem Solving Problem solving assist level: Solves basic 90% of the time/requires cueing < 10% of the time  Function - Memory Memory assist level: Recognizes or recalls 90% of the time/requires cueing < 10% of the time Patient normally able to recall (first 3 days only): Current season  Medical Problem List and Plan: 1.  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  DVT Prophylaxis/Anticoagulation: Subcutaneous Lovenox. Monitor platelet counts and any signs of bleeding 3. Pain Management: Cymbalta 30 mg daily 4. Mood: Celexa 20 mg daily. Provide emotional support 5. Neuropsych: This patient is capable of making decisions on her own behalf.  Does have higher level cognitive dysfx discussed with RN, ask NS to eval 6. Skin/Wound Care: Routine skin checks  7. Fluids/Electrolytes/Nutrition: Routine I&O with follow up labs 8. Hypertension. Coreg 6.25 mg twice a day, Lanoxin 0.0625 mg every other day.  9. 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 Lasix 20 mg daily. Monitor for any signs of fluid overload 10. Diabetes mellitus and peripheral neuropathy. Hemoglobin A1c 11. Appreciate diabetes coordinator note CBG (last 3)   Recent Labs  05/30/15 2341 05/31/15 0333 05/31/15 0658  GLUCAP 266* 385* 354*    decrease Novalog meal coverage to 5U, Increase Lantus to 30U Check blood sugars before meals and at bedtime. Diabetic teaching 11. Iron deficiency anemia. Resolved on Ferrous sulfate 650 mg daily. 12. Hyperlipidemia. Crestor- no myalgias   LOS (Days) 6 A FACE TO FACE EVALUATION WAS PERFORMED  KIRSTEINS,ANDREW E 05/31/2015, 7:21 AM

## 2015-05-31 NOTE — Progress Notes (Signed)
Occupational Therapy Session Note  Patient Details  Name: [name] MRN: [id number] Date of Birth: 06/09/1960  Today's Date: 05/31/2015 OT Individual Time: 1401-1430 OT Individual Time Calculation (min): 29 min   Skilled Therapeutic Interventions/Progress Updates:    Pt ambulated down to the ADL apartment.  Had her practice making up the bed as if she had slept in it last night and needed to make it up for the morning.  She was able to complete with min instructional cueing for walker safety.  Progressed to having her ambulate in the kitchen and use the countertops to help maneuver items back to their original location as therapist had moved them around.  Min instructional cueing to not try and take steps when holding an object with one hand and the walker with the other.  Instead move the object on the countertop while standing statically and then progress with use of the walker, with both hands on the appropriate handles.  Discussed use of the walker bag to assist with transport of smaller items.  Pt ambulated back to the room at the end of the session with close supervision.  Pt left in wheelchair with call button and phone within reach.    Therapy Documentation Precautions:  Precautions Precautions: Fall Restrictions Weight Bearing Restrictions: No  Pain: Pain Assessment Pain Assessment: No/denies pain ADL: See Function Navigator for Current Functional Status.   Therapy/Group: Individual Therapy  Manar Smalling OTR/L 05/31/2015, 3:46 PM

## 2015-05-31 NOTE — Progress Notes (Addendum)
Inpatient Diabetes Program Recommendations  AACE/ADA: New Consensus Statement on Inpatient Glycemic Control (2015)  Target Ranges:  Prepandial:   less than 140 mg/dL      Peak postprandial:   less than 180 mg/dL (1-2 hours)      Critically ill patients:  140 - 180 mg/dL   Saw patient this am and spoke with her about her home regimen and what she ate yesterday due to her hypoglycemia in the low 20's. Patient reports eating on 2/6:  Breakfast  Potatoes, 2 blueberry pancakes, 2 [place] sausages Lunch  1/2 of a chef's salad Supper  Potatoes, cabbage, [place] steak  Patient reports eating a 1/2 of peanut butter sandwich or peanut butter crackers for a night time snack usually at home.  Patient has been seeing Dr. Evlyn Kanner Endocrinology for years, patient is a Type 1 Diabetic and was diagnosed when she was 55 years old. She takes [place] 20 units at home in addition to Humalog 10-20 units for glucose in the 400-500 range. Patient reports at home her glucose trends are fasting in the 140's, lunch high 100-200 range, supper 200-300 range. Night time 300's. Patient reports only taking long acting at home and not covering her meals. Patient reports she has been told she was a brittle diabetic.  Agree with Dr, Wynn Banker plan of care today for glucose management in reducing meal coverage. Patient's hypoglycemia may be from the amount of carbs (40grams) patient consumed at supper time per patient's recollection. Unsure if increasing basal will drop her glucose tonight. I feel patient's glucose was elevated due to the over treatment of hypoglycemia by nursing staff.  Patient does not make insulin to compensate Will need Meal Coverage AND Correction. Will continue to follow patient while here. If needed recommend possibly calling Dr. Rinaldo Cloud office for input. He has rounded in the hospital in the past unsure if he would be the Endocrinologist that would come if officially consulted.   1026 am: Waiting on call  back from Dr. Cristine Polio office on patient's home regimen and last note from Dr. Evlyn Kanner from patient's visit in Dec. 2016. Called RN on floor about glucose in the mid 400's. PA waiting on BMET results. Patient to get insulin coverage. Awaiting call back and lab results.  Thanks,  Christena Deem RN, MSN, [facility] Inpatient Diabetes Coordinator Team Pager [phone] (8a-5p)

## 2015-06-01 ENCOUNTER — Inpatient Hospital Stay (HOSPITAL_COMMUNITY): Payer: Medicare Other

## 2015-06-01 ENCOUNTER — Inpatient Hospital Stay (HOSPITAL_COMMUNITY): Payer: Medicare Other | Admitting: Occupational Therapy

## 2015-06-01 LAB — BUN: BUN: 18 mg/dL (ref 6–20)

## 2015-06-01 LAB — GLUCOSE, CAPILLARY
Glucose-Capillary: 183 mg/dL — ABNORMAL HIGH (ref 65–99)
Glucose-Capillary: 157 mg/dL — ABNORMAL HIGH (ref 65–99)
Glucose-Capillary: 303 mg/dL — ABNORMAL HIGH (ref 65–99)

## 2015-06-01 LAB — CREATININE, SERUM
Creatinine, Ser: 1.2 mg/dL — ABNORMAL HIGH (ref 0.44–1.00)
GFR calc non Af Amer: 50 mL/min — ABNORMAL LOW (ref >60)
GFR, EST AFRICAN AMERICAN: 57 mL/min — AB (ref >60)

## 2015-06-01 NOTE — Progress Notes (Signed)
Social Work Patient ID: [name], female   DOB: 1960/05/01, 55 y.o.   MRN: [id number] Met with pt and left a message for her Mom regarding team conference goals-supervision level and discharge 2/11. Pt feels she is doing better but still is having balance issues. She is glad to be going home Sat. Will await Mom's return call regarding any questions or concerns. Dan-PA did address questions regarding the insulin and the cost, orders per Dr. Forde Dandy. He will see pt in his office upon discharge and address insulin options. He is aware of the cost issues and Mom's concerns.

## 2015-06-01 NOTE — Progress Notes (Addendum)
Physical Therapy Session Note  Patient Details  Name: [name] MRN: [id number] Date of Birth: [birth year]  Today's Date: 06/01/2015 PT Individual Time: 1105-1205; 4098-1191 PT Individual Time Calculation (min): 60 min , 60 min  Short Term Goals: Week 1:  PT Short Term Goal 1 (Week 1): = LTGs due ELOS  Skilled Therapeutic Interventions/Progress Updates:     Tx 1:  ait room><gym iwht RW, cues for safe use of AD, supervision. Gait up/down curb/ramp x 2  with RW, min assist, max VCS for sequencing and safety.  Pt believes LLE is stronger as it was after 1st CVA, but she has better eccentric control when descending step with LLE not RLE. neuromuscular re-education via manual cues, VCs, demo for trunk shortening/lengthening/rotating in unsupported sitting during reaching R/L; self stretching in sitting x 30 sec x 3 R/L for hamstrings and heel cord stretch; standing with forefeet on wedge for stretching of hamstrings and heel cords and facilitation of hip and ankle strategies. Max/total assist for balance when standing on wedge due to absent strategies and strong R and backwards lean.  Manual cues in supported sitting for L pelvic protraction.   Therapeutic dual task activity involving reaching R/L UE overhead to erase food words off of mirror using R and L hands, with contralateral support on RW, with manual cues for trunk extension and VCs for calf raise.  100% accurate with word task.   Pt left resting in w/c with all needs within reach.  PT discussed need for her mother to come in to observe safety issues with pt's balance, and using RW on curb/ramp.  Tx 2:  Gait to/from room with Rw. Gait through obstacle course requiring sidestepping L and R and retro gait, with close supervision, safely.  Velocity = 55 seconds. 0.60 '/sec.   neuromuscular re-education via forced use using Kinetron at level 4 x 7 minutes, focusing on trunk rotation and neutral bil hip rotation.  Biased to L standing  during R hand fine motor activity x 2 minutes with manual cues for increased wt bearing LLE. Pt had no memory of self stretching this AM at all, even after set-up by PT.  After demo; floor transfer for fall recovery: pt required 2 transitional stools for movement from mat> floor mat, with mod/max assist, and to arise from floor through 1/2 kneeling, mod/max assist and max cues.  Pt left resting in room with all needs within reach.  Therapy Documentation Precautions:  Precautions Precautions: Fall Restrictions Weight Bearing Restrictions: No   Vital Signs: Therapy Vitals Pulse Rate: 80 Pain: Pain Assessment Pain Assessment: No/denies pain      See Function Navigator for Current Functional Status.   Therapy/Group: Individual Therapy  Cambridge Deleo 06/01/2015, 12:26 PM

## 2015-06-01 NOTE — Progress Notes (Signed)
Occupational Therapy Session Note  Patient Details  Name: [name] MRN: [id number] Date of Birth: 1960/06/05  Today's Date: 06/01/2015 OT Individual Time: 4098-1191 OT Individual Time Calculation (min): 89 min    Short Term Goals: Week 1:  OT Short Term Goal 1 (Week 1): Pt will complete all bathing with modified independent from shower level.  OT Short Term Goal 2 (Week 1): Pt will complete toileting and toilet transfers with modified independence. OT Short Term Goal 3 (Week 1): Pt will complete tub/shower transfers with supervision using shower seat.  OT Short Term Goal 4 (Week 1): Pt will complete simple meal prep at modified independent level using RW.   Skilled Therapeutic Interventions/Progress Updates:    Began session with bathing and dressing in the room.  Pt ambulated around the room with supervision using the RW to gather items for shower and dressing prior to starting.  Mod instructional cueing still needed for pt to not turn the walker to the side when reaching for items in the dresser or closet.  Pt still tends to also hold onto the walker with stand to sit instead of transitioning hands back down on the surface she is sitting on.  Overall supervision for shower and all dressing with the exception of donning TEDs.  Pt ambulated around the room with the RW and supervision to clean up towels and dirty laundry as well.  Next had pt ambulate to the therapy gym for work on standing balance and UE coordination for the RUE.  Had pt work in standing while engaged in reaching to various levels with rotation to the side.  Pt still with LOB posteriorly at times and occasionally anteriorly.  Progressed to use of UE ergonometer for 2 intervals of 4 mins each.  Resistance set on level 5 with RPMs maintained at 20-25.  One interval completed peddling forward and one backwards after 2-3 min rest break.  Returned to room at end of session.  Pt's mother present at end  session.      Therapy  Documentation Precautions:  Precautions Precautions: Fall Restrictions Weight Bearing Restrictions: No  Pain: Pain Assessment Pain Assessment: No/denies pain ADL: See Function Navigator for Current Functional Status.   Therapy/Group: Individual Therapy  Maleka Contino OTR/L 06/01/2015, 9:14 AM

## 2015-06-01 NOTE — Plan of Care (Signed)
Problem: RH Toilet Transfers Goal: LTG Patient will perform toilet transfers w/assist (OT) LTG: Patient will perform toilet transfers with assist, with/without cues using equipment (OT)  Goals downgraded to supervision level secondary to increased safety risk at this time.

## 2015-06-01 NOTE — Progress Notes (Signed)
Social Work Patient ID: [name], female   DOB: 1960-08-31, 55 y.o.   MRN: [id number] Spoke with Mom who reports pt has Medicaid which covers her medications, her insulin is only $8.00 so there is no concern about this. There seemed to be a disconnect regarding medication costs. Have informed Dan-PA and Jeanne-RN of this information. Mom can be here Friday at 9:30 Am to attend therapies With pt for training. 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 Does prefer [facility] so will make referral for pt also.

## 2015-06-01 NOTE — Patient Care Conference (Signed)
Inpatient RehabilitationTeam Conference and Plan of Care Update Date: 06/01/2015   Time: 10:45 AM    Patient Name: [name]      Medical Record Number: [id number]  Date of Birth: 07-13-1960 Sex: Female         Room/Bed: 4W18C/4W18C-01 Payor Info: Payor: [employer]vertising copy[employer] MEDICARE / Plan: [employer] MEDICARE / Product Type: *No Product type* /    Admitting Diagnosis: L CVA  Admit Date/Time:  05/25/2015  5:41 PM Admission Comments: No comment available   Primary Diagnosis:  <principal problem not specified> Principal Problem: <principal problem not specified>  Patient Active Problem List   Diagnosis Date Noted  . 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 Coronary artery disease involving native coronary artery of native heart without angina pectoris   . Chronic systolic congestive heart failure (HCC)   . Anemia, iron deficiency   . HLD (hyperlipidemia)   . Cerebrovascular accident (CVA) due to thrombosis of basilar artery (HCC)   . Diplopia   . 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 DM type 2 with diabetic peripheral neuropathy (HCC)   . History of CVA with residual deficit   . Benign essential HTN   . Hemiparesis affecting dominant side as late effect of stroke (HCC)   . DM type 2, goal A1c below 7 05/22/2015  . Heme + stool 05/04/2014  . DKA (diabetic ketoacidoses) (HCC) 03/23/2013  . Elevated troponin 03/23/2013  . Type I (juvenile type) diabetes mellitus with ophthalmic manifestations, uncontrolled 02/17/2013  . Depression 02/17/2013  . Altered mental status 02/14/2013    Class: Acute  . Diabetic ketoacidosis (HCC) 02/14/2013    Class: Acute  . Hypercalcemia 02/14/2013    Class: Acute  . Dehydration 02/14/2013    Class: Acute  . Acute renal insufficiency 02/14/2013    Class: Acute  . Fatigue 05/04/2012  . OSA (obstructive sleep apnea) 07/03/2011  . UTI (lower urinary tract infection) 03/21/2011  . Preseptal cellulitis 11/21/2010  .  CVA (cerebral infarction) 10/17/2010  . FATIGUE 07/10/2010  . PERIMENOPAUSAL STATUS 07/10/2010  . DIABETES MELLITUS, TYPE I 04/25/2010  . DIABETIC  RETINOPATHY 04/25/2010  . Coronary atherosclerosis of native coronary artery 04/25/2010  . ISCHEMIC CARDIOMYOPATHY 04/25/2010  . Chronic systolic heart failure (HCC) 04/25/2010  . ORTHOSTATIC HYPOTENSION 04/25/2010  . Hyperlipidemia 08/30/2008  . ANEMIA-IRON DEFICIENCY 08/30/2008  . HYPERTENSION 08/30/2008    Expected Discharge Date: Expected Discharge Date: 06/04/15  Team Members Present: Physician leading conference: Dr. Claudette Laws Social Worker Present: Dossie Der, LCSW Nurse Present: Chana Bode, RN PT Present: Wanda Plump, PT OT Present: Perrin Maltese, OT SLP Present: Feliberto Gottron, SLP PPS Coordinator present : Tora Duck, RN, CRRN     Current Status/Progress Goal Weekly Team Focus  Medical   Diabetes uncontrolled,,Poor awareness of medications  Diabetic management  at baseline, patient has chronic brittle diabetes even difficult for her endocrinologist to manage  Diabetic management   Bowel/Bladder   pt continent of bowel and bladder LBM 2-8  Mod I  contiune POC   Swallow/Nutrition/ Hydration     na        ADL's   supervision to min guard assist for all bathing, dressing, grooming, toileting, and transfers with use of the RW.  Needs cueing for safe walker placement as well as to not pull up on the walker when trying to stand.   modified independent to supervison overall  selfcare re-training, balance re-training, transfer training, DME use, home management  re-training, pt/family education   Mobility   close supervision with RW for gait and transfers  supervision overall  balance, ramp/curb, family ed   Communication   na         Safety/Cognition/ Behavioral Observations    no unsafe behaviors        Pain   denies pain         Skin   Skin CDI  free of skin breakdown Mod I  assess skin q shift       *See Care Plan and progress notes for long and short-term goals.  Barriers to Discharge: Patient is unable to afford optimal diabetic medications.    Possible Resolutions to Barriers:  Will need to substitute alternatives, she will need to follow-up with her endocrinologist as an outpatient    Discharge Planning/Teaching Needs:  Home with Mom who can proviode supervision she is still recovering from CABG. Pt is doing well and making good progress in therapies.      Team Discussion:  Goals-supervision level due to balance issues-tends to lean backwards at times. Insulin an issue and MD and Dr. Evlyn Kanner working on best option which is also affordable for pt. Double vision better. Still cognition issues that were premorbid from previous stroke.   Revisions to Treatment Plan:  OT downgraded goals to supervision level      [name], [name] 06/01/2015, 3:19 PM

## 2015-06-01 NOTE — Progress Notes (Signed)
Inpatient Diabetes Program Recommendations  AACE/ADA: New Consensus Statement on Inpatient Glycemic Control (2015)  Target Ranges:  Prepandial:   less than 140 mg/dL      Peak postprandial:   less than 180 mg/dL (1-2 hours)      Critically ill patients:  140 - 180 mg/dL   Results for OMESHA, BOWERMAN (MRN [id number]) as of 06/01/2015 07:56  Ref. Range 05/31/2015 06:58 05/31/2015 09:53 05/31/2015 12:17 05/31/2015 13:21 05/31/2015 17:05 05/31/2015 19:57 05/31/2015 21:10 05/31/2015 22:59 06/01/2015 06:39  Glucose-Capillary Latest Ref Range: 65-99 mg/dL 086 (H) 578 (H) 469 (H) 559 (HH) 347 (H) 218 (H) 135 (H) 204 (H) 157 (H)   Review of Glycemic Control  Discussed care with Dr. Doroteo Bradford this am. Patient reports eating for supper the following: Meatloaf Potatoes Green beans Roll Either fruit cup or orange Jello  Patient received a total of 20 units of Novolog at supper time for glucose of 387 mg/dl. Patient looks to require 1 unit of insulin per every 8-10 grams of carbs. Patient's fasting glucose this am is 157 mg/dl. Patient to start on home [place] today. Will watch trends while in rehab.  Thanks,  Christena Deem RN, MSN, [facility] Inpatient Diabetes Coordinator Team Pager [phone] (8a-5p)

## 2015-06-01 NOTE — Progress Notes (Addendum)
Subjective/Complaints: Discussed plan with DM coordinator, feels wel this am slept ok  ROS- no N/V, diarrhea, contipation, no CP or SOB Objective: Vital Signs: Blood pressure 126/69, pulse 82, temperature 97.9 F (36.6 C), temperature source Oral, resp. rate 18, height 5' 4"  (1.626 m), weight 71.2 kg (156 lb 15.5 oz), last menstrual period 03/13/2011, SpO2 99 %. No results found. Results for orders placed or performed during the hospital encounter of 05/25/15 (from the past 72 hour(s))  Glucose, capillary     Status: Abnormal   Collection Time: 05/29/15 11:24 AM  Result Value Ref Range   Glucose-Capillary 361 (H) 65 - 99 mg/dL  Glucose, capillary     Status: Abnormal   Collection Time: 05/29/15  4:34 PM  Result Value Ref Range   Glucose-Capillary 233 (H) 65 - 99 mg/dL  Glucose, capillary     Status: Abnormal   Collection Time: 05/29/15  9:31 PM  Result Value Ref Range   Glucose-Capillary 205 (H) 65 - 99 mg/dL  Glucose, capillary     Status: Abnormal   Collection Time: 05/30/15  7:13 AM  Result Value Ref Range   Glucose-Capillary 324 (H) 65 - 99 mg/dL  Glucose, capillary     Status: Abnormal   Collection Time: 05/30/15 11:48 AM  Result Value Ref Range   Glucose-Capillary 459 (H) 65 - 99 mg/dL   Comment 1 Notify RN   Glucose, capillary     Status: Abnormal   Collection Time: 05/30/15  4:46 PM  Result Value Ref Range   Glucose-Capillary 145 (H) 65 - 99 mg/dL   Comment 1 Notify RN   Glucose, capillary     Status: Abnormal   Collection Time: 05/30/15  9:30 PM  Result Value Ref Range   Glucose-Capillary 21 (LL) 65 - 99 mg/dL  Glucose, capillary     Status: Abnormal   Collection Time: 05/30/15 10:13 PM  Result Value Ref Range   Glucose-Capillary 25 (LL) 65 - 99 mg/dL  Glucose, capillary     Status: Abnormal   Collection Time: 05/30/15 10:52 PM  Result Value Ref Range   Glucose-Capillary 230 (H) 65 - 99 mg/dL  Glucose, capillary     Status: Abnormal   Collection Time:  05/30/15 11:41 PM  Result Value Ref Range   Glucose-Capillary 266 (H) 65 - 99 mg/dL  Glucose, capillary     Status: Abnormal   Collection Time: 05/31/15  3:33 AM  Result Value Ref Range   Glucose-Capillary 385 (H) 65 - 99 mg/dL  Glucose, capillary     Status: Abnormal   Collection Time: 05/31/15  6:58 AM  Result Value Ref Range   Glucose-Capillary 354 (H) 65 - 99 mg/dL  Glucose, capillary     Status: Abnormal   Collection Time: 05/31/15  9:53 AM  Result Value Ref Range   Glucose-Capillary 444 (H) 65 - 99 mg/dL  Basic metabolic panel     Status: Abnormal   Collection Time: 05/31/15 10:05 AM  Result Value Ref Range   Sodium 137 135 - 145 mmol/L   Potassium 4.2 3.5 - 5.1 mmol/L   Chloride 99 (L) 101 - 111 mmol/L   CO2 26 22 - 32 mmol/L   Glucose, Bld 504 (H) 65 - 99 mg/dL   BUN 24 (H) 6 - 20 mg/dL   Creatinine, Ser 1.24 (H) 0.44 - 1.00 mg/dL   Calcium 9.9 8.9 - 10.3 mg/dL   GFR calc non Af Amer 48 (L) >60 mL/min   GFR  calc Af Amer 55 (L) >60 mL/min    Comment: (NOTE) The eGFR has been calculated using the CKD EPI equation. This calculation has not been validated in all clinical situations. eGFR's persistently <60 mL/min signify possible Chronic Kidney Disease.    Anion gap 12 5 - 15  Glucose, capillary     Status: Abnormal   Collection Time: 05/31/15 11:40 AM  Result Value Ref Range   Glucose-Capillary 486 (H) 65 - 99 mg/dL  Glucose, capillary     Status: Abnormal   Collection Time: 05/31/15 12:17 PM  Result Value Ref Range   Glucose-Capillary 495 (H) 65 - 99 mg/dL  Glucose, capillary     Status: Abnormal   Collection Time: 05/31/15 12:49 PM  Result Value Ref Range   Glucose-Capillary 485 (H) 65 - 99 mg/dL  Glucose, capillary     Status: Abnormal   Collection Time: 05/31/15  1:21 PM  Result Value Ref Range   Glucose-Capillary 559 (HH) 65 - 99 mg/dL   Comment 1 Notify RN   Glucose, capillary     Status: Abnormal   Collection Time: 05/31/15  4:16 PM  Result Value Ref  Range   Glucose-Capillary 387 (H) 65 - 99 mg/dL  Glucose, capillary     Status: Abnormal   Collection Time: 05/31/15  5:05 PM  Result Value Ref Range   Glucose-Capillary 347 (H) 65 - 99 mg/dL  Glucose, capillary     Status: Abnormal   Collection Time: 05/31/15  7:57 PM  Result Value Ref Range   Glucose-Capillary 218 (H) 65 - 99 mg/dL  Glucose, capillary     Status: Abnormal   Collection Time: 05/31/15  9:10 PM  Result Value Ref Range   Glucose-Capillary 135 (H) 65 - 99 mg/dL  Glucose, capillary     Status: Abnormal   Collection Time: 05/31/15 10:59 PM  Result Value Ref Range   Glucose-Capillary 204 (H) 65 - 99 mg/dL  Glucose, capillary     Status: Abnormal   Collection Time: 06/01/15  5:33 AM  Result Value Ref Range   Glucose-Capillary 183 (H) 65 - 99 mg/dL  Creatinine, serum     Status: Abnormal   Collection Time: 06/01/15  5:54 AM  Result Value Ref Range   Creatinine, Ser 1.20 (H) 0.44 - 1.00 mg/dL   GFR calc non Af Amer 50 (L) >60 mL/min   GFR calc Af Amer 57 (L) >60 mL/min    Comment: (NOTE) The eGFR has been calculated using the CKD EPI equation. This calculation has not been validated in all clinical situations. eGFR's persistently <60 mL/min signify possible Chronic Kidney Disease.   Glucose, capillary     Status: Abnormal   Collection Time: 06/01/15  6:39 AM  Result Value Ref Range   Glucose-Capillary 157 (H) 65 - 99 mg/dL      General: No acute distress Mood and affect are appropriate Eyes- non injected, keeps Left eye closed  Heart: Regular rate and rhythm no rubs murmurs or extra sounds Lungs: Clear to auscultation, breathing unlabored, no rales or wheezes Abdomen: Positive bowel sounds, soft nontender to palpation, nondistended Extremities: No clubbing, cyanosis, or edema Skin: No evidence of breakdown, no evidence of rash Neurologic: Cranial nerve IV palsy, motor strength is 5/5 in left  deltoid, bicep, tricep, grip,Bilateral hip flexor, knee extensors,  ankle dorsiflexor and plantar flexor, 4/5 RUE with ataxia Sensory exam normal sensation to light touch and proprioception in bilateral upper and lower extremities Cerebellar exam normal finger to  nose to finger as well as heel to shin in bilateral upper and lower extremities Musculoskeletal: Full range of motion in all 4 extremities. No joint swelling   Assessment/Plan: 1. Functional deficits secondary to Left paracentral midbrain and pontine infarcts which require 3+ hours per day of interdisciplinary therapy in a comprehensive inpatient rehab setting. Physiatrist is providing close team supervision and 24 hour management of active medical problems listed below. Physiatrist and rehab team continue to assess barriers to discharge/monitor patient progress toward functional and medical goals. FIM: Function - Bathing Position: Shower Body parts bathed by patient: Right arm, Left arm, Chest, Abdomen, Front perineal area, Buttocks, Right upper leg, Left upper leg, Right lower leg, Left lower leg Body parts bathed by helper: Back Bathing not applicable: Back Assist Level: Supervision or verbal cues  Function- Upper Body Dressing/Undressing What is the patient wearing?: Pull over shirt/dress Pull over shirt/dress - Perfomed by patient: Thread/unthread right sleeve, Thread/unthread left sleeve, Put head through opening Pull over shirt/dress - Perfomed by helper: Pull shirt over trunk Assist Level: Touching or steadying assistance(Pt > 75%) (one LOB) Function - Lower Body Dressing/Undressing What is the patient wearing?: Underwear, Pants, Socks, [employer], Non-skid slipper socks Position: Standing at [employer] - Performed by patient: Thread/unthread right underwear leg, Thread/unthread left underwear leg, Pull underwear up/down Pants- Performed by patient: Thread/unthread right pants leg, Thread/unthread left pants leg, Pull pants up/down Non-skid slipper socks- Performed by patient: Don/doff  right sock, Don/doff left sock Socks - Performed by patient: Don/doff right sock, Don/doff left sock Socks - Performed by helper: Don/doff right sock, Don/doff left sock Shoes - Performed by patient: Don/doff right shoe, Don/doff left shoe, Fasten right, Fasten left TED Hose - Performed by patient: Don/doff right TED hose, Don/doff left TED hose TED Hose - Performed by helper: Don/doff left TED hose, Don/doff right TED hose Assist for footwear: Supervision/touching assist Assist for lower body dressing: Touching or steadying assistance (Pt > 75%)  Function - Toileting Toileting steps completed by patient: Adjust clothing prior to toileting, Performs perineal hygiene, Adjust clothing after toileting Toileting Assistive Devices: Grab bar or rail Assist level: More than reasonable time  Function - Air cabin crew transfer assistive device: Walker, Elevated toilet seat/BSC over toilet Assist level to toilet: Supervision or verbal cues Assist level from toilet: No Help, no cues, assistive device, takes more than a reasonable amount of time Assist level to bedside commode (at bedside): Touching or steadying assistance (Pt > 75%) Assist level from bedside commode (at bedside): Touching or steadying assistance (Pt > 75%)  Function - Chair/bed transfer Chair/bed transfer method: Ambulatory Chair/bed transfer assist level: Supervision or verbal cues Chair/bed transfer assistive device: Armrests, Walker Chair/bed transfer details: Verbal cues for technique  Function - Locomotion: Wheelchair Will patient use wheelchair at discharge?: No Type: Manual Max wheelchair distance: 100 Assist Level: Supervision or verbal cues Assist Level: Supervision or verbal cues Assist Level: Supervision or verbal cues Turns around,maneuvers to table,bed, and toilet,negotiates 3% grade,maneuvers on rugs and over doorsills: No Function - Locomotion: Ambulation Assistive device: Walker-rolling Max  distance: 150 Assist level: Supervision or verbal cues Assist level: Supervision or verbal cues Assist level: Supervision or verbal cues Assist level: Supervision or verbal cues Assist level: Moderate assist (Pt 50 - 74%)  Function - Comprehension Comprehension: Auditory Comprehension assist level: Understands complex 90% of the time/cues 10% of the time  Function - Expression Expression: Verbal Expression assist level: Expresses basic needs/ideas: With no assist  Function -  Social Interaction Social Interaction assist level: Interacts appropriately 90% of the time - Needs monitoring or encouragement for participation or interaction.  Function - Problem Solving Problem solving assist level: Solves basic 90% of the time/requires cueing < 10% of the time  Function - Memory Memory assist level: Recognizes or recalls 75 - 89% of the time/requires cueing 10 - 24% of the time Patient normally able to recall (first 3 days only): Current season  Medical Problem List and Plan: 1.  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 Formulized individual treatment plan based on medical history, underlying problem and comorbidities. 2.  DVT Prophylaxis/Anticoagulation: Subcutaneous Lovenox. Monitor platelet counts and any signs of bleeding 3. Pain Management: Cymbalta 30 mg daily 4. Mood: Celexa 20 mg daily. Provide emotional support 5. Neuropsych: This patient is capable of making decisions on her own behalf.  Does have higher level cognitive dysfx discussed with RN, ask NS to eval 6. Skin/Wound Care: Routine skin checks  7. Fluids/Electrolytes/Nutrition: Routine I&O with follow up labs 8. Hypertension. Coreg 6.25 mg twice a day, Lanoxin 0.0625 mg every other day.  9. Chronic systolic  congestive heart failure with ischemic cardiomyopathy/NSTEMI. Lasix 20 mg daily. Monitor for any signs of fluid overload, received 1 litre of NS yesterday for elevated creat, need to be judicious given CHF hx 10. Diabetes mellitus and peripheral neuropathy. Hemoglobin A1c 11. Appreciate diabetes coordinator note CBG (last 3)   Recent Labs  05/31/15 2259 06/01/15 0533 06/01/15 0639  GLUCAP 204* 183* 157*    decrease Novalog meal coverage to 2U, Will institute home dose Tresiba 20U per day family will bring Check blood sugars before meals and at bedtime. Diabetic teaching 11. Iron deficiency anemia. Resolved on Ferrous sulfate 650 mg daily. 12. Hyperlipidemia. Crestor- no myalgias   LOS (Days) 7 A FACE TO FACE EVALUATION WAS PERFORMED  Adil Tugwell E 06/01/2015, 7:31 AM

## 2015-06-02 ENCOUNTER — Inpatient Hospital Stay (HOSPITAL_COMMUNITY): Payer: Medicare Other

## 2015-06-02 ENCOUNTER — Inpatient Hospital Stay (HOSPITAL_COMMUNITY): Payer: Medicare Other | Admitting: Occupational Therapy

## 2015-06-02 ENCOUNTER — Ambulatory Visit (HOSPITAL_COMMUNITY): Payer: Medicare Other | Admitting: Occupational Therapy

## 2015-06-02 LAB — GLUCOSE, CAPILLARY
Glucose-Capillary: 303 mg/dL — ABNORMAL HIGH (ref 65–99)
Glucose-Capillary: 297 mg/dL — ABNORMAL HIGH (ref 65–99)
Glucose-Capillary: 290 mg/dL — ABNORMAL HIGH (ref 65–99)
Glucose-Capillary: 324 mg/dL — ABNORMAL HIGH (ref 65–99)
Glucose-Capillary: 380 mg/dL — ABNORMAL HIGH (ref 65–99)
Glucose-Capillary: 232 mg/dL — ABNORMAL HIGH (ref 65–99)

## 2015-06-02 MED ORDER — INSULIN DEGLUDEC 200 UNIT/ML ~~LOC~~ SOPN
20.0000 [IU] | PEN_INJECTOR | Freq: Every day | SUBCUTANEOUS | Status: DC
  Administered 2015-06-03: 20 [IU] via SUBCUTANEOUS
  Filled 2015-06-02 (×3): qty 3

## 2015-06-02 NOTE — Progress Notes (Signed)
Occupational Therapy Session Note  Patient Details  Name: [name] MRN: [id number] Date of Birth: Feb 01, 1961  Today's Date: 06/02/2015 OT Individual Time: 1330-1414 OT Individual Time Calculation (min): 44 min    Short Term Goals: Week 1:  OT Short Term Goal 1 (Week 1): Pt will complete all bathing with modified independent from shower level.  OT Short Term Goal 2 (Week 1): Pt will complete toileting and toilet transfers with modified independence. OT Short Term Goal 3 (Week 1): Pt will complete tub/shower transfers with supervision using shower seat.  OT Short Term Goal 4 (Week 1): Pt will complete simple meal prep at modified independent level using RW.   Skilled Therapeutic Interventions/Progress Updates:    Pt ambulated to the therapy gym with supervision using RW.  Once in the gym focused session on ankle and hip strategies using the Biodex and Limits of Stability program.  Pt needing min assist to perform strategies appropriately standing on firm surface.  Once this was completed, had pt perform simple mobility with hand held assist, without use of the walker.  Min assist for balance ambulating down to the tub/shower room.  Had pt practice tub shower transfer with and without walker while using the grab bars for support.  From tub room had pt ambulate back up through the day room and back to her room, all with supervision.  Pt did a much better job of reaching back to all surfaces before sitting, which she was not carrying over in previous days sessions.    Therapy Documentation Precautions:  Precautions Precautions: Fall Restrictions Weight Bearing Restrictions: No  Pain: Pain Assessment Pain Assessment: No/denies pain ADL: See Function Navigator for Current Functional Status.   Therapy/Group: Individual Therapy  Rylyn Ranganathan OTR/L 06/02/2015, 4:48 PM

## 2015-06-02 NOTE — Progress Notes (Signed)
Occupational Therapy Session Note  Patient Details  Name: [name] MRN: [id number] Date of Birth: 25-May-1960  Today's Date: 06/02/2015 OT Individual Time: 1130-1200 OT Individual Time Calculation (min): 30 min    Short Term Goals: Week 1:  OT Short Term Goal 1 (Week 1): Pt will complete all bathing with modified independent from shower level.  OT Short Term Goal 2 (Week 1): Pt will complete toileting and toilet transfers with modified independence. OT Short Term Goal 3 (Week 1): Pt will complete tub/shower transfers with supervision using shower seat.  OT Short Term Goal 4 (Week 1): Pt will complete simple meal prep at modified independent level using RW.   Skilled Therapeutic Interventions/Progress Updates:    Therapeutic activities with focus on challenging dynamic standing balance and righting reactions without UE support. Stood on foam with eyes open with min A 4x for 2 min each. PT required VC to weight shift towards the left to maintain balance. Addressed control descent with standing to sit without Ue support - requiring min to mod A for control.  Engaged in Biodex for continued balance training.  Pt played "catch" on Biodex with focus on weight shifts/ limits of stability without UE support with min A and more than reasonable time to achieve weight shift towards a target.    Therapy Documentation Precautions:  Precautions Precautions: Fall Restrictions Weight Bearing Restrictions: No Pain: Pain Assessment Pain Assessment: No/denies pain  See Function Navigator for Current Functional Status.   Therapy/Group: Individual Therapy  Roney Mans [facility] 06/02/2015, 3:18 PM

## 2015-06-02 NOTE — Progress Notes (Signed)
Subjective/Complaints: Pt aware of d/c date   ROS- no N/V, diarrhea, contipation, no CP or SOB Objective: Vital Signs: Blood pressure 125/66, pulse 80, temperature 98 F (36.7 C), temperature source Oral, resp. rate 17, height 5' 4"  (1.626 m), weight 71.8 kg (158 lb 4.6 oz), last menstrual period 03/13/2011, SpO2 98 %. No results found. Results for orders placed or performed during the hospital encounter of 05/25/15 (from the past 72 hour(s))  Glucose, capillary     Status: Abnormal   Collection Time: 05/30/15 11:48 AM  Result Value Ref Range   Glucose-Capillary 459 (H) 65 - 99 mg/dL   Comment 1 Notify RN   Glucose, capillary     Status: Abnormal   Collection Time: 05/30/15  4:46 PM  Result Value Ref Range   Glucose-Capillary 145 (H) 65 - 99 mg/dL   Comment 1 Notify RN   Glucose, capillary     Status: Abnormal   Collection Time: 05/30/15  9:30 PM  Result Value Ref Range   Glucose-Capillary 21 (LL) 65 - 99 mg/dL  Glucose, capillary     Status: Abnormal   Collection Time: 05/30/15 10:13 PM  Result Value Ref Range   Glucose-Capillary 25 (LL) 65 - 99 mg/dL  Glucose, capillary     Status: Abnormal   Collection Time: 05/30/15 10:52 PM  Result Value Ref Range   Glucose-Capillary 230 (H) 65 - 99 mg/dL  Glucose, capillary     Status: Abnormal   Collection Time: 05/30/15 11:41 PM  Result Value Ref Range   Glucose-Capillary 266 (H) 65 - 99 mg/dL  Glucose, capillary     Status: Abnormal   Collection Time: 05/31/15  3:33 AM  Result Value Ref Range   Glucose-Capillary 385 (H) 65 - 99 mg/dL  Glucose, capillary     Status: Abnormal   Collection Time: 05/31/15  6:58 AM  Result Value Ref Range   Glucose-Capillary 354 (H) 65 - 99 mg/dL  Glucose, capillary     Status: Abnormal   Collection Time: 05/31/15  9:53 AM  Result Value Ref Range   Glucose-Capillary 444 (H) 65 - 99 mg/dL  Basic metabolic panel     Status: Abnormal   Collection Time: 05/31/15 10:05 AM  Result Value Ref Range    Sodium 137 135 - 145 mmol/L   Potassium 4.2 3.5 - 5.1 mmol/L   Chloride 99 (L) 101 - 111 mmol/L   CO2 26 22 - 32 mmol/L   Glucose, Bld 504 (H) 65 - 99 mg/dL   BUN 24 (H) 6 - 20 mg/dL   Creatinine, Ser 1.24 (H) 0.44 - 1.00 mg/dL   Calcium 9.9 8.9 - 10.3 mg/dL   GFR calc non Af Amer 48 (L) >60 mL/min   GFR calc Af Amer 55 (L) >60 mL/min    Comment: (NOTE) The eGFR has been calculated using the CKD EPI equation. This calculation has not been validated in all clinical situations. eGFR's persistently <60 mL/min signify possible Chronic Kidney Disease.    Anion gap 12 5 - 15  Glucose, capillary     Status: Abnormal   Collection Time: 05/31/15 11:40 AM  Result Value Ref Range   Glucose-Capillary 486 (H) 65 - 99 mg/dL  Glucose, capillary     Status: Abnormal   Collection Time: 05/31/15 12:17 PM  Result Value Ref Range   Glucose-Capillary 495 (H) 65 - 99 mg/dL  Glucose, capillary     Status: Abnormal   Collection Time: 05/31/15 12:49 PM  Result  Value Ref Range   Glucose-Capillary 485 (H) 65 - 99 mg/dL  Glucose, capillary     Status: Abnormal   Collection Time: 05/31/15  1:21 PM  Result Value Ref Range   Glucose-Capillary 559 (HH) 65 - 99 mg/dL   Comment 1 Notify RN   Glucose, capillary     Status: Abnormal   Collection Time: 05/31/15  4:16 PM  Result Value Ref Range   Glucose-Capillary 387 (H) 65 - 99 mg/dL  Glucose, capillary     Status: Abnormal   Collection Time: 05/31/15  5:05 PM  Result Value Ref Range   Glucose-Capillary 347 (H) 65 - 99 mg/dL  Glucose, capillary     Status: Abnormal   Collection Time: 05/31/15  7:57 PM  Result Value Ref Range   Glucose-Capillary 218 (H) 65 - 99 mg/dL  Glucose, capillary     Status: Abnormal   Collection Time: 05/31/15  9:10 PM  Result Value Ref Range   Glucose-Capillary 135 (H) 65 - 99 mg/dL  Glucose, capillary     Status: Abnormal   Collection Time: 05/31/15 10:59 PM  Result Value Ref Range   Glucose-Capillary 204 (H) 65 - 99  mg/dL  Glucose, capillary     Status: Abnormal   Collection Time: 06/01/15  5:33 AM  Result Value Ref Range   Glucose-Capillary 183 (H) 65 - 99 mg/dL  Creatinine, serum     Status: Abnormal   Collection Time: 06/01/15  5:54 AM  Result Value Ref Range   Creatinine, Ser 1.20 (H) 0.44 - 1.00 mg/dL   GFR calc non Af Amer 50 (L) >60 mL/min   GFR calc Af Amer 57 (L) >60 mL/min    Comment: (NOTE) The eGFR has been calculated using the CKD EPI equation. This calculation has not been validated in all clinical situations. eGFR's persistently <60 mL/min signify possible Chronic Kidney Disease.   BUN     Status: None   Collection Time: 06/01/15  5:54 AM  Result Value Ref Range   BUN 18 6 - 20 mg/dL  Glucose, capillary     Status: Abnormal   Collection Time: 06/01/15  6:39 AM  Result Value Ref Range   Glucose-Capillary 157 (H) 65 - 99 mg/dL  Glucose, capillary     Status: Abnormal   Collection Time: 06/01/15 12:21 PM  Result Value Ref Range   Glucose-Capillary 303 (H) 65 - 99 mg/dL  Glucose, capillary     Status: Abnormal   Collection Time: 06/01/15  4:38 PM  Result Value Ref Range   Glucose-Capillary 303 (H) 65 - 99 mg/dL  Glucose, capillary     Status: Abnormal   Collection Time: 06/01/15  9:32 PM  Result Value Ref Range   Glucose-Capillary 297 (H) 65 - 99 mg/dL  Glucose, capillary     Status: Abnormal   Collection Time: 06/02/15  6:49 AM  Result Value Ref Range   Glucose-Capillary 290 (H) 65 - 99 mg/dL      General: No acute distress Mood and affect are appropriate Eyes- non injected, keeps Left eye closed  Heart: Regular rate and rhythm no rubs murmurs or extra sounds Lungs: Clear to auscultation, breathing unlabored, no rales or wheezes Abdomen: Positive bowel sounds, soft nontender to palpation, nondistended Extremities: No clubbing, cyanosis, or edema Skin: No evidence of breakdown, no evidence of rash Neurologic: Cranial nerve IV palsy, motor strength is 5/5 in left   deltoid, bicep, tricep, grip,Bilateral hip flexor, knee extensors, ankle dorsiflexor and plantar flexor,  4/5 RUE with ataxia Sensory exam normal sensation to light touch and proprioception in bilateral upper and lower extremities Cerebellar exam normal finger to nose to finger as well as heel to shin in bilateral upper and lower extremities Musculoskeletal: Full range of motion in all 4 extremities. No joint swelling   Assessment/Plan: 1. Functional deficits secondary to Left paracentral midbrain and pontine infarcts which require 3+ hours per day of interdisciplinary therapy in a comprehensive inpatient rehab setting. Physiatrist is providing close team supervision and 24 hour management of active medical problems listed below. Physiatrist and rehab team continue to assess barriers to discharge/monitor patient progress toward functional and medical goals. FIM: Function - Bathing Position: Shower Body parts bathed by patient: Right arm, Left arm, Chest, Abdomen, Front perineal area, Buttocks, Right upper leg, Left upper leg, Right lower leg, Left lower leg Body parts bathed by helper: Back Bathing not applicable: Back Assist Level: Supervision or verbal cues  Function- Upper Body Dressing/Undressing What is the patient wearing?: Pull over shirt/dress Pull over shirt/dress - Perfomed by patient: Thread/unthread right sleeve, Thread/unthread left sleeve, Put head through opening, Pull shirt over trunk Pull over shirt/dress - Perfomed by helper: Pull shirt over trunk Assist Level: Supervision or verbal cues Function - Lower Body Dressing/Undressing What is the patient wearing?: Underwear, Pants, Ted Hose Position: Standing at sink Underwear - Performed by patient: Thread/unthread right underwear leg, Thread/unthread left underwear leg, Pull underwear up/down Pants- Performed by patient: Thread/unthread right pants leg, Thread/unthread left pants leg, Pull pants up/down Non-skid slipper  socks- Performed by patient: Don/doff right sock, Don/doff left sock Socks - Performed by patient: Don/doff right sock, Don/doff left sock Socks - Performed by helper: Don/doff right sock, Don/doff left sock Shoes - Performed by patient: Don/doff right shoe, Don/doff left shoe, Fasten right, Fasten left Shoes - Performed by helper: Don/doff right shoe, Don/doff left shoe, Fasten right, Fasten left TED Hose - Performed by patient: Don/doff right TED hose, Don/doff left TED hose TED Hose - Performed by helper: Don/doff left TED hose, Don/doff right TED hose Assist for footwear: Supervision/touching assist Assist for lower body dressing: Touching or steadying assistance (Pt > 75%)  Function - Toileting Toileting steps completed by patient: Adjust clothing prior to toileting, Performs perineal hygiene, Adjust clothing after toileting Toileting Assistive Devices: Grab bar or rail Assist level: Supervision or verbal cues  Function - Air cabin crew transfer assistive device: Walker, Elevated toilet seat/BSC over toilet Assist level to toilet: Supervision or verbal cues Assist level from toilet: Supervision or verbal cues Assist level to bedside commode (at bedside): Touching or steadying assistance (Pt > 75%) Assist level from bedside commode (at bedside): Touching or steadying assistance (Pt > 75%)  Function - Chair/bed transfer Chair/bed transfer method: Ambulatory Chair/bed transfer assist level: Supervision or verbal cues Chair/bed transfer assistive device: Armrests, Walker Chair/bed transfer details: Verbal cues for technique  Function - Locomotion: Wheelchair Will patient use wheelchair at discharge?: No Type: Manual Max wheelchair distance: 100 Assist Level: Supervision or verbal cues Assist Level: Supervision or verbal cues Assist Level: Supervision or verbal cues Turns around,maneuvers to table,bed, and toilet,negotiates 3% grade,maneuvers on rugs and over doorsills:  No Function - Locomotion: Ambulation Assistive device: Walker-rolling Max distance: 150 Assist level: Supervision or verbal cues Assist level: Supervision or verbal cues Assist level: Supervision or verbal cues Assist level: Supervision or verbal cues Assist level: Moderate assist (Pt 50 - 74%)  Function - Comprehension Comprehension: Auditory Comprehension assist level: Understands complex 90%  of the time/cues 10% of the time  Function - Expression Expression: Verbal Expression assist level: Expresses basic needs/ideas: With no assist  Function - Social Interaction Social Interaction assist level: Interacts appropriately 90% of the time - Needs monitoring or encouragement for participation or interaction.  Function - Problem Solving Problem solving assist level: Solves basic 25 - 49% of the time - needs direction more than half the time to initiate, plan or complete simple activities  Function - Memory Memory assist level: Recognizes or recalls less than 25% of the time/requires cueing greater than 75% of the time Patient normally able to recall (first 3 days only): Current season  Medical Problem List and Plan: 1.  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  DVT Prophylaxis/Anticoagulation: Subcutaneous Lovenox. Monitor platelet counts and any signs of bleeding 3. Pain Management: Cymbalta 30 mg daily 4. Mood: Celexa 20 mg daily. Provide emotional support 5. Neuropsych: This patient is capable of making decisions on her own behalf.  Does have higher level cognitive dysfx discussed with RN, 6. Skin/Wound Care: Routine skin checks  7. Fluids/Electrolytes/Nutrition: Routine I&O with follow up labs, a little dry with diuresis from elevated CBG received 1 litre IVF, no additional needed, d/c IV 8. Hypertension. Coreg 6.25 mg twice a day, Lanoxin 0.0625 mg every other day.  9. Chronic  systolic congestive heart failure with ischemic cardiomyopathy/NSTEMI. Lasix 20 mg daily. 10. Diabetes mellitus and peripheral neuropathy. Hemoglobin A1c 11. Appreciate diabetes coordinator note CBG (last 3)   Recent Labs  06/01/15 1638 06/01/15 2132 06/02/15 0649  GLUCAP 303* 297* 290*    decrease Novalog meal coverage to 3U, Will institute home dose Tresiba 20U per day family will bring Check blood sugars before meals and at bedtime. Diabetic teaching 11. Iron deficiency anemia. Resolved on Ferrous sulfate 650 mg daily.Will cont this at home 12. Hyperlipidemia. Crestor- no myalgias   LOS (Days) 8 A FACE TO FACE EVALUATION WAS PERFORMED  KIRSTEINS,ANDREW E 06/02/2015, 7:40 AM

## 2015-06-02 NOTE — Progress Notes (Signed)
Social Work Patient ID: [name], female   DOB: [birth year], 55 y.o.   MRN: [id number] Spoke with MD office and informed to have pt's Mom call Monday to set up follow up appointment. Have informed Mom of this and she will plans to do this on Monday.

## 2015-06-02 NOTE — Progress Notes (Signed)
Physical Therapy Session Note  Patient Details  Name: [name] MRN: [id number] Date of Birth: 1960-07-26  Today's Date: 06/02/2015 PT Individual Time: 0900-1000 PT Individual Time Calculation (min): 60 min   Short Term Goals: Week 1:  PT Short Term Goal 1 (Week 1): = LTGs due ELOS     Skilled Therapeutic Interventions/Progress Updates:   gait in room to toilet and room with supervision, RW, no cues for safe use of DMe.  Close supervision during clothing manipulation, with pt holding onto wall rail intermittently.  She stated she does not have a wall rail at home, and the BR is extremely small. Gait room><gym with RW, supervision.  neuromuscular re-education via forced use, manual cues for trunk shortening/lengthening with wt shifting on Kinetron in sitting and standing with bil UE support fading to no UE support, to facilitate trunk righting. TUG and 10 MWT.  Gait up/down 12 corner stairs with bil rails, self selected step through ascending and descending, leading with L foot both ways, without LOB or unsteadiness. Gait over floor mat to simulate outdoor ground, supervision with Rw.  Pt ambulated back to room; VCs for safe hand placement stand> sit.  Left resting with w/c with all needs within reach.    Therapy Documentation Precautions:  Precautions Precautions: Fall Restrictions Weight Bearing Restrictions: No   Pain: Pain Assessment Pain Assessment: No/denies pain   Locomotion : Gait Gait velocity: 10 MWT = 1.64'/sec/0.5 m/sec     Balance: Standardized Balance Assessment Standardized Balance Assessment: Timed Up and Go Test Timed Up and Go Test TUG: Normal TUG Normal TUG (seconds): 36 (avg of 3, wiht RW)       See Function Navigator for Current Functional Status.   Therapy/Group: Individual Therapy  Viraat Vanpatten 06/02/2015, 11:10 AM

## 2015-06-02 NOTE — Discharge Instructions (Signed)
Inpatient Rehab Discharge Instructions  [name] Discharge date and time: No discharge date for patient encounter.   Activities/Precautions/ Functional Status: Activity: activity as tolerated Diet: diabetic diet Wound Care: none needed Functional status:  ___ No restrictions     ___ Walk up steps independently ___ 24/7 supervision/assistance   ___ Walk up steps with assistance ___ Intermittent supervision/assistance  ___ Bathe/dress independently ___ Walk with walker     _x__ Bathe/dress with assistance ___ Walk Independently    ___ Shower independently _x__ Walk with assistance    ___ Shower with assistance ___ No alcohol     ___ Return to work/school ________  Special Instructions: Follow-up with Dr. Evlyn Kanner on recommendations of insulin therapy and cost effectiveness\    COMMUNITY REFERRALS UPON DISCHARGE:    Home Health:   PT, OT, RN   Agency:[facility]  Phone:[phone]   Date of last service:06/04/2015  Medical Equipment/Items Ordered:NO NEEDS HAS FROM PREVIOUS ADMITS    GENERAL COMMUNITY RESOURCES FOR PATIENT/FAMILY: Support Groups:[facility] SUPPORT GROUP SECOND Thursday OF EACH MONTH @ 3:00-4:00 PM ON THE REHAB UNIT IF QUESTIONS PLEASE CONTACT KATIE  [phone]  STROKE/TIA DISCHARGE INSTRUCTIONS SMOKING Cigarette smoking nearly doubles your risk of having a stroke & is the single most alterable risk factor  If you smoke or have smoked in the last 12 months, you are advised to quit smoking for your health.  Most of the excess cardiovascular risk related to smoking disappears within a year of stopping.  Ask you doctor about anti-smoking medications  [place] Quit Line: 1-800-QUIT NOW  Free Smoking Cessation Classes [phone]  CHOLESTEROL Know your levels; limit fat & cholesterol in your diet  Lipid Panel     Component Value Date/Time   CHOL 126 05/23/2015 0500   TRIG 115 05/23/2015 0500   HDL 45 05/23/2015 0500   CHOLHDL 2.8 05/23/2015 0500   VLDL 23  05/23/2015 0500   LDLCALC 58 05/23/2015 0500      Many patients benefit from treatment even if their cholesterol is at goal.  Goal: Total Cholesterol (CHOL) less than 160  Goal:  Triglycerides (TRIG) less than 150  Goal:  HDL greater than 40  Goal:  LDL (LDLCALC) less than 100   BLOOD PRESSURE American Stroke Association blood pressure target is less that 120/80 mm/Hg  Your discharge blood pressure is:  BP: 126/69 mmHg  Monitor your blood pressure  Limit your salt and alcohol intake  Many individuals will require more than one medication for high blood pressure  DIABETES (A1c is a blood sugar average for last 3 months) Goal HGBA1c is under 7% (HBGA1c is blood sugar average for last 3 months)  Diabetes:    Lab Results  Component Value Date   HGBA1C 11.0* 05/23/2015     Your HGBA1c can be lowered with medications, healthy diet, and exercise.  Check your blood sugar as directed by your physician  Call your physician if you experience unexplained or low blood sugars.  PHYSICAL ACTIVITY/REHABILITATION Goal is 30 minutes at least 4 days per week  Activity: Increase activity slowly, Therapies: Physical Therapy: Home Health Return to work:   Activity decreases your risk of heart attack and stroke and makes your heart stronger.  It helps control your weight and blood pressure; helps you relax and can improve your mood.  Participate in a regular exercise program.  Talk with your doctor about the best form of exercise for you (dancing, walking, swimming, cycling).  DIET/WEIGHT Goal is to  maintain a healthy weight  Your discharge diet is: Diet heart healthy/carb modified Room service appropriate?: Yes; Fluid consistency:: Thin  liquids Your height is:  Height:  (162.6 cm) Your current weight is: Weight: 71.2 kg (156 lb 15.5 oz) Your Body Mass Index (BMI) is:  BMI (Calculated): 26.4  Following the type of diet specifically designed for you will help prevent another  stroke.  Your goal weight range is:    Your goal Body Mass Index (BMI) is 19-24.  Healthy food habits can help reduce 3 risk factors for stroke:  High cholesterol, hypertension, and excess weight.  RESOURCES Stroke/Support Group:  Call [phone]   STROKE EDUCATION PROVIDED/REVIEWED AND GIVEN TO PATIENT Stroke warning signs and symptoms How to activate emergency medical system (call 911). Medications prescribed at discharge. Need for follow-up after discharge. Personal risk factors for stroke. Pneumonia vaccine given:  Flu vaccine given:  My questions have been answered, the writing is legible, and I understand these instructions.  I will adhere to these goals & educational materials that have been provided to me after my discharge from the hospital.       My questions have been answered and I understand these instructions. I will adhere to these goals and the provided educational materials after my discharge from the hospital.  Patient/Caregiver Signature _______________________________ Date __________  Clinician Signature _______________________________________ Date __________  Please bring this form and your medication list with you to all your follow-up doctor's appointments.

## 2015-06-02 NOTE — Progress Notes (Signed)
Occupational Therapy Session Note  Patient Details  Name: [name] MRN: [id number] Date of Birth: 1960-07-27  Today's Date: 06/02/2015 OT Individual Time: 7841-2820 OT Individual Time Calculation (min): 60 min    Short Term Goals: Week 1:  OT Short Term Goal 1 (Week 1): Pt will complete all bathing with modified independent from shower level.  OT Short Term Goal 2 (Week 1): Pt will complete toileting and toilet transfers with modified independence. OT Short Term Goal 3 (Week 1): Pt will complete tub/shower transfers with supervision using shower seat.  OT Short Term Goal 4 (Week 1): Pt will complete simple meal prep at modified independent level using RW.     Skilled Therapeutic Interventions/Progress Updates:    Pt seen for skilled OT to facilitate functional mobility and standing balance with ADL retraining. When asked what pt would like to focus on the most, she said her balance but that the strokes did not affect much else. Pt needed a few minor cues to initiate some steps, but pt was able to complete her routine quite independently. Her balance was stable throughout the session, therefore she only needed S.  Pt ambulated to gym to work on standing balance exercises she can do at home at the kitchen sink: heel raises, hip abd, hip ext, and squats. Pt also worked on sit><stands without UE support to develop LE strength and balance. Pt ambulated back to room with all needs met.     Therapy Documentation Precautions:  Precautions Precautions: Fall Restrictions Weight Bearing Restrictions: No   Pain: Pain Assessment Pain Assessment: No/denies pain ADL:  See Function Navigator for Current Functional Status.   Therapy/Group: Individual Therapy  Ajai Terhaar 06/02/2015, 12:29 PM

## 2015-06-03 ENCOUNTER — Inpatient Hospital Stay (HOSPITAL_COMMUNITY): Payer: Medicare Other | Admitting: Occupational Therapy

## 2015-06-03 ENCOUNTER — Ambulatory Visit (HOSPITAL_COMMUNITY): Payer: Medicare Other | Admitting: Physical Therapy

## 2015-06-03 LAB — GLUCOSE, CAPILLARY
GLUCOSE-CAPILLARY: 181 mg/dL — AB (ref 65–99)
GLUCOSE-CAPILLARY: 115 mg/dL — AB (ref 65–99)
Glucose-Capillary: 254 mg/dL — ABNORMAL HIGH (ref 65–99)
Glucose-Capillary: 168 mg/dL — ABNORMAL HIGH (ref 65–99)

## 2015-06-03 MED ORDER — FUROSEMIDE 20 MG PO TABS: ORAL_TABLET | ORAL | Status: DC

## 2015-06-03 MED ORDER — CARVEDILOL 6.25 MG PO TABS: 6.2500 mg | ORAL_TABLET | Freq: Two times a day (BID) | ORAL | Status: DC

## 2015-06-03 MED ORDER — DIGOXIN 125 MCG PO TABS: 0.0625 mg | ORAL_TABLET | ORAL | Status: AC

## 2015-06-03 MED ORDER — CITALOPRAM HYDROBROMIDE 20 MG PO TABS: 20.0000 mg | ORAL_TABLET | Freq: Every day | ORAL | Status: AC

## 2015-06-03 MED ORDER — DULOXETINE HCL 30 MG PO CPEP: 30.0000 mg | ORAL_CAPSULE | Freq: Every day | ORAL | Status: DC

## 2015-06-03 MED ORDER — CLOPIDOGREL BISULFATE 75 MG PO TABS: 75.0000 mg | ORAL_TABLET | Freq: Every day | ORAL | Status: AC

## 2015-06-03 MED ORDER — INSULIN DEGLUDEC 200 UNIT/ML ~~LOC~~ SOPN: 20.0000 [IU] | PEN_INJECTOR | Freq: Every day | SUBCUTANEOUS | Status: DC

## 2015-06-03 MED ORDER — ROSUVASTATIN CALCIUM 40 MG PO TABS: 40.0000 mg | ORAL_TABLET | Freq: Every day | ORAL | Status: AC

## 2015-06-03 MED ORDER — FERROUS SULFATE 325 (65 FE) MG PO TABS: 650.0000 mg | ORAL_TABLET | Freq: Every day | ORAL | Status: AC

## 2015-06-03 NOTE — Progress Notes (Signed)
Inpatient Diabetes Program Recommendations  AACE/ADA: New Consensus Statement on Inpatient Glycemic Control (2015)  Target Ranges:  Prepandial:   less than 140 mg/dL      Peak postprandial:   less than 180 mg/dL (1-2 hours)      Critically ill patients:  140 - 180 mg/dL   Review of Glycemic Control:  Results for KAYTLIN, BURKLOW (MRN [id number]) as of 06/03/2015 13:32  Ref. Range 06/03/2015 06:37 06/03/2015 11:34  Glucose-Capillary Latest Ref Range: 65-99 mg/dL 409 (H) 811 (H)    Inpatient Diabetes Program Recommendations:    Please consider increasing Novolog meal coverage to 4 units tid with meals.  Thanks, Beryl Meager, RN, BC-ADM Inpatient Diabetes Coordinator Pager [phone] (8a-5p)

## 2015-06-03 NOTE — Discharge Summary (Signed)
Discharge summary job [id number]

## 2015-06-03 NOTE — Progress Notes (Signed)
Social Work Patient ID: [name], female   DOB: 05-Nov-1960, 55 y.o.   MRN: [id number] Mom was here and went through therapies with pt and feels it went well and she can provide the care pt requires at home. Pt is ready for discharge tomorrow. Dan-PA will go over discharge instructions with pt.

## 2015-06-03 NOTE — Progress Notes (Signed)
Occupational Therapy Session Note  Patient Details  Name: [name] MRN: [id number] Date of Birth: 12-07-1960  Today's Date: 06/03/2015 OT Individual Time: 4010-2725 OT Individual Time Calculation (min): 56 min    Short Term Goals: Week 1:  OT Short Term Goal 1 (Week 1): Pt will complete all bathing with modified independent from shower level.  OT Short Term Goal 2 (Week 1): Pt will complete toileting and toilet transfers with modified independence. OT Short Term Goal 3 (Week 1): Pt will complete tub/shower transfers with supervision using shower seat.  OT Short Term Goal 4 (Week 1): Pt will complete simple meal prep at modified independent level using RW.   Skilled Therapeutic Interventions/Progress Updates:    Pt worked on bathing and dressing sit to stand during session.  Her mother was present for session as well.  Ms. Folden was able to use the RW to gather items for shower with supervision.  She completed shower sitting on the shower bench with modified independence, using rail for support as needed.  She was able to complete dressing sit to stand as well with supervision.  Grooming tasks completed at the sink with modified independence.  Note pt with slight difficulty reaching back to clean peri area during bathing.  Will educate her on toilet aide next session.  Pt's mother was able to safely assist with transfers into the bathroom this session.  She has also purchased gait belt for use as well.   Therapy Documentation Precautions:  Precautions Precautions: Fall Restrictions Weight Bearing Restrictions: No  Pain: Pain Assessment Pain Assessment: No/denies pain ADL: See Function Navigator for Current Functional Status.   Therapy/Group: Individual Therapy  Laney Bagshaw OTR/L 06/03/2015, 12:34 PM

## 2015-06-03 NOTE — Progress Notes (Signed)
Physical Therapy Discharge Summary  Patient Details  Name: [name] MRN: [id number] Date of Birth: 1960-06-02  Today's Date: 06/03/2015 PT Individual Time: 1030-1125 PT Individual Time Calculation (min): 55 min   Pt's mother present for treatment.  Pt performed gait throughout unit up to 350' with RW with supervision.  Mod I with bed mobility, supervision with sit to stand with cues for safety.  Ramp/curb negotiation with supervision, cues to keep RW on ground and not lift when crossing threshold of ramp.  Car transfer to simulated sedan with pt's mother able to verbalize proper technique, pt supervision with car transfer.  Stair negotiation with 2 handrails x 12 stairs with supervision.  Seated hamstring stretch 3 x 30 sec bilat, pt given handout of stretch for HEP.  Obstacle negotiation with side and backward stepping with RW with supervision, cues for problem solving and safety. Pt and mother both report they feel ready for safe d/c home tomorrow at supervision level.  Patient has met 9 of 9 long term goals due to improved activity tolerance, improved balance, improved postural control, increased strength and ability to compensate for deficits.  Patient to discharge at an ambulatory level Supervision.   Patient's care partner is independent to provide the necessary supervision assistance at discharge.  Pt's mother observed PT session and feels comfortable to supervise car transfer and gait up/down ramp and step.  Reasons goals not met: n/a  Recommendation:  Patient will benefit from ongoing skilled PT services in home health setting to continue to advance safe functional mobility, address ongoing impairments in balance, strength, coordination, and minimize fall risk.  Equipment: No equipment provided, pt owns RW  Reasons for discharge: treatment goals met and discharge from hospital  Patient/family agrees with progress made and goals achieved: Yes  PT  Discharge Precautions/Restrictions Precautions Precautions: Fall Restrictions Weight Bearing Restrictions: No Pain Pain Assessment Pain Assessment: No/denies pain  Cognition Overall Cognitive Status: History of cognitive impairments - at baseline Orientation Level: Oriented X4 Memory Impairment: Decreased recall of new information;Decreased short term memory Sensation Sensation Light Touch: Appears Intact Proprioception Impaired Details: Impaired LLE Coordination Gross Motor Movements are Fluid and Coordinated: No Fine Motor Movements are Fluid and Coordinated: No Coordination and Movement Description: ataxic movements in UE and LE Motor  Motor Motor: Hemiplegia Motor - Discharge Observations: mild R UE and LE hemiplegia   Trunk/Postural Assessment  Cervical Assessment Cervical Assessment:  (head tilt Rt) Thoracic Assessment Thoracic Assessment:  (slight kyphosis) Lumbar Assessment Lumbar Assessment: Within Functional Limits Postural Control Trunk Control: decreased balalance reactions  Extremity Assessment      RLE PROM (degrees) RLE Overall PROM Comments: charcot type deformity; ankle DF 0 degrees RLE Strength RLE Overall Strength Comments: grossly 4/5 to 4+/5  throughout; done in sitting LLE PROM (degrees) LLE Overall PROM Comments: Charcot type deformity ; ankle DF 0 degrees LLE Strength LLE Overall Strength Comments: grossly 5/5 hip flex, abd; knee flex/ext; 4/5 ankle DF and hip adductors; all in sitting   See Function Navigator for Current Functional Status.  Juri Dinning 06/03/2015, 11:25 AM

## 2015-06-03 NOTE — Discharge Summary (Signed)
[name], [name]                     ACCOUNT NO.:  [id number]  MEDICAL RECORD NO.:  [id number]  LOCATION:  [id number]                        FACILITY:  [facility]  PHYSICIAN:  Erick Colace, M.D.DATE OF BIRTH:  1960-08-05  DATE OF ADMISSION:  05/24/2015 DATE OF DISCHARGE:  06/04/2015                              DISCHARGE SUMMARY   DISCHARGE DIAGNOSES: 1. 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 2. Subcutaneous Lovenox for DVT prophylaxis. 3. Pain management. 4. Hypertension. 5. Chronic systolic congestive heart failure with ischemic     cardiomyopathy. 6. Diabetes mellitus. 7. Peripheral neuropathy.  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 The patient lives with her parents.  Mother is 89 years old with limited assistance.  The patient used a rolling walker prior to admission. Presented on May 22, 2015, with double vision, decrease in balance, and recent fall while using a walker.  MRI of the brain showed acute small, nonhemorrhagic infarct, left paracentral midbrain and posterior left paracentral pons.  Remote small infarcts scattered throughout the centrum semiovale.  Remote thalamic infarct.  CT angiogram of the head showed no major intracranial arterial occlusion.  Echocardiogram with ejection fraction of 40% with systolic function moderately reduced.  No cardiac source of emboli identified. The patient did not receive tPA.  Maintained on aspirin and Plavix for CVA prophylaxis.  Subcutaneous Lovenox for DVT prophylaxis.  Physical and occupational therapy ongoing.  The patient was admitted for a comprehensive rehab program.  PAST MEDICAL HISTORY:  See  discharge diagnoses.  SOCIAL HISTORY:  Lives with mother.  Used a walker prior to admission.  FUNCTIONAL STATUS:  Upon admission to rehab services was min assist, ambulate 100 feet rolling walker; min assist, sit to stand; min-mod assist, activities of daily living.  PHYSICAL EXAMINATION:  VITAL SIGNS:  Blood pressure 124/66, pulse 77, temperature 98, respirations 16. GENERAL:  This was an alert female, oriented x3. LUNGS:  Clear to auscultation without wheeze. CARDIAC:  Regular rate and rhythm.  No murmur. ABDOMEN:  Soft, nontender.  Good bowel sounds.  She showed fair awareness of deficits.  REHABILITATION HOSPITAL COURSE:  The patient was admitted to inpatient rehab services with therapies initiated on a 3-hour daily basis consisting of physical therapy, occupational therapy, and rehabilitation nursing.  The following issues were addressed during the patient's rehabilitation stay.  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  Subcutaneous Lovenox for DVT prophylaxis.  The patient with brittle diabetes.  Hemoglobin A1c of 11.  She was followed by Endocrinology, Dr. Evlyn Kanner, who was contacted secondary to the patient's variables in blood sugars.  Advised to resume home regimen.  She was taken off her Levemir and placed on Tresiba with strong advice to follow up with Dr. Evlyn Kanner on the patient's discharge from the hospital.  The  patient exhibited no signs of fluid overload.  She remained on low-dose Lasix.  No chest pain or shortness of breath.  Crestor for hyperlipidemia.  Subcutaneous Lovenox was discontinued at the time of discharge for DVT prophylaxis.  The patient received weekly collaborative interdisciplinary team conferences to discuss estimated length of stay, family teaching, and any barriers to her discharge.  Required  close supervision during clothing, manipulation, ambulates to the therapy gym with a rolling walker supervision.  Up and down stairs with supervision.  Needing occasional cues for hand placement.  Activities of daily living and homemaking.  She gathered her belongings for bathing, dressing, and hygiene.  She was able to complete her routine quite independently. Full family teaching was completed and plan discharge to home.  DISCHARGE MEDICATIONS:  Include: 1. Aspirin 81 mg p.o. daily. 2. Coreg 6.25 mg p.o. b.i.d. 3. Celexa 20 mg p.o. daily. 4. Plavix 75 mg p.o. daily. 5. Lanoxin 0.0625 mg p.o. every other day. 6. Cymbalta 30 mg p.o. daily. 7. Ferrous sulfate 650 mg p.o. daily. 8. Lasix 20 mg p.o. daily. 9. Tresiba 20 units subcutaneous daily. 10.Crestor 40 mg p.o. daily.  DIET:  Diabetic diet.  FOLLOWUP:  She would follow up with Dr. Adrian Prince, Medical Management; Dr. Claudette Laws office to call for appointment; Dr. Roda Shutters, Neurology Services in 1 month.  Ongoing therapies arranged as per rehab services.     Mariam Dollar, P.A.   ______________________________ Erick Colace, M.D.    DA/MEDQ  D:  06/03/2015  T:  06/03/2015  Job:  [id number]  cc:   Jeannett Senior A. Evlyn Kanner, M.D. Marvel Plan, MD Claudette Laws, MD

## 2015-06-03 NOTE — Progress Notes (Signed)
Subjective/Complaints: Pt aware of d/c date   ROS- no N/V, diarrhea, contipation, no CP or SOB Objective: Vital Signs: Blood pressure 115/60, pulse 76, temperature 98 F (36.7 C), temperature source Oral, resp. rate 18, height 5' 4"  (1.626 m), weight 70.6 kg (155 lb 10.3 oz), last menstrual period 03/13/2011, SpO2 100 %. No results found. Results for orders placed or performed during the hospital encounter of 05/25/15 (from the past 72 hour(s))  Glucose, capillary     Status: Abnormal   Collection Time: 05/31/15  9:53 AM  Result Value Ref Range   Glucose-Capillary 444 (H) 65 - 99 mg/dL  Basic metabolic panel     Status: Abnormal   Collection Time: 05/31/15 10:05 AM  Result Value Ref Range   Sodium 137 135 - 145 mmol/L   Potassium 4.2 3.5 - 5.1 mmol/L   Chloride 99 (L) 101 - 111 mmol/L   CO2 26 22 - 32 mmol/L   Glucose, Bld 504 (H) 65 - 99 mg/dL   BUN 24 (H) 6 - 20 mg/dL   Creatinine, Ser 1.24 (H) 0.44 - 1.00 mg/dL   Calcium 9.9 8.9 - 10.3 mg/dL   GFR calc non Af Amer 48 (L) >60 mL/min   GFR calc Af Amer 55 (L) >60 mL/min    Comment: (NOTE) The eGFR has been calculated using the CKD EPI equation. This calculation has not been validated in all clinical situations. eGFR's persistently <60 mL/min signify possible Chronic Kidney Disease.    Anion gap 12 5 - 15  Glucose, capillary     Status: Abnormal   Collection Time: 05/31/15 11:40 AM  Result Value Ref Range   Glucose-Capillary 486 (H) 65 - 99 mg/dL  Glucose, capillary     Status: Abnormal   Collection Time: 05/31/15 12:17 PM  Result Value Ref Range   Glucose-Capillary 495 (H) 65 - 99 mg/dL  Glucose, capillary     Status: Abnormal   Collection Time: 05/31/15 12:49 PM  Result Value Ref Range   Glucose-Capillary 485 (H) 65 - 99 mg/dL  Glucose, capillary     Status: Abnormal   Collection Time: 05/31/15  1:21 PM  Result Value Ref Range   Glucose-Capillary 559 (HH) 65 - 99 mg/dL   Comment 1 Notify RN   Glucose,  capillary     Status: Abnormal   Collection Time: 05/31/15  4:16 PM  Result Value Ref Range   Glucose-Capillary 387 (H) 65 - 99 mg/dL  Glucose, capillary     Status: Abnormal   Collection Time: 05/31/15  5:05 PM  Result Value Ref Range   Glucose-Capillary 347 (H) 65 - 99 mg/dL  Glucose, capillary     Status: Abnormal   Collection Time: 05/31/15  7:57 PM  Result Value Ref Range   Glucose-Capillary 218 (H) 65 - 99 mg/dL  Glucose, capillary     Status: Abnormal   Collection Time: 05/31/15  9:10 PM  Result Value Ref Range   Glucose-Capillary 135 (H) 65 - 99 mg/dL  Glucose, capillary     Status: Abnormal   Collection Time: 05/31/15 10:59 PM  Result Value Ref Range   Glucose-Capillary 204 (H) 65 - 99 mg/dL  Glucose, capillary     Status: Abnormal   Collection Time: 06/01/15  5:33 AM  Result Value Ref Range   Glucose-Capillary 183 (H) 65 - 99 mg/dL  Creatinine, serum     Status: Abnormal   Collection Time: 06/01/15  5:54 AM  Result Value Ref Range  Creatinine, Ser 1.20 (H) 0.44 - 1.00 mg/dL   GFR calc non Af Amer 50 (L) >60 mL/min   GFR calc Af Amer 57 (L) >60 mL/min    Comment: (NOTE) The eGFR has been calculated using the CKD EPI equation. This calculation has not been validated in all clinical situations. eGFR's persistently <60 mL/min signify possible Chronic Kidney Disease.   BUN     Status: None   Collection Time: 06/01/15  5:54 AM  Result Value Ref Range   BUN 18 6 - 20 mg/dL  Glucose, capillary     Status: Abnormal   Collection Time: 06/01/15  6:39 AM  Result Value Ref Range   Glucose-Capillary 157 (H) 65 - 99 mg/dL  Glucose, capillary     Status: Abnormal   Collection Time: 06/01/15 12:21 PM  Result Value Ref Range   Glucose-Capillary 303 (H) 65 - 99 mg/dL  Glucose, capillary     Status: Abnormal   Collection Time: 06/01/15  4:38 PM  Result Value Ref Range   Glucose-Capillary 303 (H) 65 - 99 mg/dL  Glucose, capillary     Status: Abnormal   Collection Time:  06/01/15  9:32 PM  Result Value Ref Range   Glucose-Capillary 297 (H) 65 - 99 mg/dL  Glucose, capillary     Status: Abnormal   Collection Time: 06/02/15  6:49 AM  Result Value Ref Range   Glucose-Capillary 290 (H) 65 - 99 mg/dL  Glucose, capillary     Status: Abnormal   Collection Time: 06/02/15 12:10 PM  Result Value Ref Range   Glucose-Capillary 324 (H) 65 - 99 mg/dL  Glucose, capillary     Status: Abnormal   Collection Time: 06/02/15  4:43 PM  Result Value Ref Range   Glucose-Capillary 380 (H) 65 - 99 mg/dL  Glucose, capillary     Status: Abnormal   Collection Time: 06/02/15  8:39 PM  Result Value Ref Range   Glucose-Capillary 232 (H) 65 - 99 mg/dL  Glucose, capillary     Status: Abnormal   Collection Time: 06/03/15  6:37 AM  Result Value Ref Range   Glucose-Capillary 181 (H) 65 - 99 mg/dL      General: No acute distress Mood and affect are appropriate Eyes- non injected, keeps Left eye closed  Heart: Regular rate and rhythm no rubs murmurs or extra sounds Lungs: Clear to auscultation, breathing unlabored, no rales or wheezes Abdomen: Positive bowel sounds, soft nontender to palpation, nondistended Extremities: No clubbing, cyanosis, or edema Skin: No evidence of breakdown, no evidence of rash Neurologic: Cranial nerve IV palsy, motor strength is 5/5 in left  deltoid, bicep, tricep, grip,Bilateral hip flexor, knee extensors, ankle dorsiflexor and plantar flexor, 4/5 RUE with ataxia Sensory exam normal sensation to light touch and proprioception in bilateral upper and lower extremities Cerebellar exam normal finger to nose to finger as well as heel to shin in bilateral upper and lower extremities Musculoskeletal: Full range of motion in all 4 extremities. No joint swelling   Assessment/Plan: 1. Functional deficits secondary to Left paracentral midbrain and pontine infarcts which require 3+ hours per day of interdisciplinary therapy in a comprehensive inpatient rehab  setting. Physiatrist is providing close team supervision and 24 hour management of active medical problems listed below. Physiatrist and rehab team continue to assess barriers to discharge/monitor patient progress toward functional and medical goals. FIM: Function - Bathing Position: Shower Body parts bathed by patient: Right arm, Left arm, Chest, Abdomen, Front perineal area, Buttocks, Right upper  leg, Left upper leg, Right lower leg, Left lower leg Body parts bathed by helper: Back Bathing not applicable: Back Assist Level: Supervision or verbal cues  Function- Upper Body Dressing/Undressing What is the patient wearing?: Pull over shirt/dress Pull over shirt/dress - Perfomed by patient: Thread/unthread right sleeve, Thread/unthread left sleeve, Put head through opening, Pull shirt over trunk Pull over shirt/dress - Perfomed by helper: Pull shirt over trunk Assist Level: Supervision or verbal cues Function - Lower Body Dressing/Undressing What is the patient wearing?: Underwear, Pants, Ted Hose, Shoes Position: Sitting EOB Underwear - Performed by patient: Thread/unthread right underwear leg, Thread/unthread left underwear leg, Pull underwear up/down Pants- Performed by patient: Thread/unthread right pants leg, Thread/unthread left pants leg, Pull pants up/down Non-skid slipper socks- Performed by patient: Don/doff right sock, Don/doff left sock Socks - Performed by patient: Don/doff right sock, Don/doff left sock Socks - Performed by helper: Don/doff right sock, Don/doff left sock Shoes - Performed by patient: Don/doff right shoe, Don/doff left shoe, Fasten right, Fasten left Shoes - Performed by helper: Don/doff right shoe, Don/doff left shoe, Fasten right, Fasten left TED Hose - Performed by patient: Don/doff right TED hose, Don/doff left TED hose TED Hose - Performed by helper: Don/doff left TED hose, Don/doff right TED hose Assist for footwear: Setup Assist for lower body dressing:  Supervision or verbal cues  Function - Toileting Toileting steps completed by patient: Adjust clothing prior to toileting, Performs perineal hygiene, Adjust clothing after toileting Toileting Assistive Devices: Grab bar or rail Assist level: Supervision or verbal cues  Function - Air cabin crew transfer assistive device: Walker, Elevated toilet seat/BSC over toilet Assist level to toilet: Supervision or verbal cues Assist level from toilet: Supervision or verbal cues Assist level to bedside commode (at bedside): Supervision or verbal cues Assist level from bedside commode (at bedside): Supervision or verbal cues  Function - Chair/bed transfer Chair/bed transfer method: Ambulatory Chair/bed transfer assist level: Supervision or verbal cues Chair/bed transfer assistive device: Armrests, Walker Chair/bed transfer details: Verbal cues for safe use of DME/AE  Function - Locomotion: Wheelchair Will patient use wheelchair at discharge?: No Type: Manual Max wheelchair distance: 100 Assist Level: Supervision or verbal cues Assist Level: Supervision or verbal cues Assist Level: Supervision or verbal cues Turns around,maneuvers to table,bed, and toilet,negotiates 3% grade,maneuvers on rugs and over doorsills: No Function - Locomotion: Ambulation Assistive device: Walker-rolling Max distance: 150 Assist level: Supervision or verbal cues Assist level: Supervision or verbal cues Assist level: Supervision or verbal cues Assist level: Supervision or verbal cues Assist level: Supervision or verbal cues  Function - Comprehension Comprehension: Auditory Comprehension assist level: Understands complex 90% of the time/cues 10% of the time  Function - Expression Expression: Verbal Expression assist level: Expresses basic needs/ideas: With no assist  Function - Social Interaction Social Interaction assist level: Interacts appropriately 90% of the time - Needs monitoring or  encouragement for participation or interaction.  Function - Problem Solving Problem solving assist level: Solves basic 25 - 49% of the time - needs direction more than half the time to initiate, plan or complete simple activities  Function - Memory Memory assist level: Recognizes or recalls less than 25% of the time/requires cueing greater than 75% of the time Patient normally able to recall (first 3 days only): Current season  Medical Problem List and Plan: 1.  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  DVT Prophylaxis/Anticoagulation: Subcutaneous Lovenox. Monitor platelet counts  and any signs of bleeding 3. Pain Management: Cymbalta 30 mg daily 4. Mood: Celexa 20 mg daily. Provide emotional support 5. Neuropsych: This patient is capable of making decisions on her own behalf.  Does have higher level cognitive dysfx discussed with RN, 6. Skin/Wound Care: Routine skin checks  7. Fluids/Electrolytes/Nutrition: Routine I&O with follow up labs,  8. Hypertension. Coreg 6.25 mg twice a day, Lanoxin 0.0625 mg every other day.  9. Chronic systolic congestive heart failure with ischemic cardiomyopathy/NSTEMI. Lasix 20 mg daily. 10. Diabetes mellitus and peripheral neuropathy. Hemoglobin A1c 11. Appreciate diabetes coordinator note CBG (last 3)   Recent Labs  06/02/15 1643 06/02/15 2039 06/03/15 0637  GLUCAP 380* 232* 181*    decrease Novalog meal coverage to 3U, Will institute home dose Tresiba 20U per day family will bring Check blood sugars before meals and at bedtime. Diabetic teaching, sensitive sliding scale, f/u Endo next week 11. Iron deficiency anemia. Resolved on Ferrous sulfate 650 mg daily 12. Hyperlipidemia. Crestor- no myalgias   LOS (Days) 9 A FACE TO FACE EVALUATION WAS PERFORMED  KIRSTEINS,ANDREW E 06/03/2015, 7:38 AM

## 2015-06-03 NOTE — Plan of Care (Signed)
Problem: RH PAIN MANAGEMENT Goal: RH STG PAIN MANAGED AT OR BELOW PT'S PAIN GOAL < 3 on 0-10 pain scale.  Outcome: Progressing Patient makes no complaint of pain

## 2015-06-03 NOTE — Plan of Care (Signed)
Problem: RH SKIN INTEGRITY Goal: RH STG SKIN FREE OF INFECTION/BREAKDOWN Patient's skin to remain free from new infection/breakdown with minimal assistance while on Rehab.  Outcome: Progressing No s/s of skin breakdown noted

## 2015-06-03 NOTE — Progress Notes (Signed)
Social Work  Discharge Note  The overall goal for the admission was met for:   Discharge location: Yes-HOME WITH MOM WHO CAN PROVIDE 24 HR SUPERVISION LEVEL  Length of Stay: Yes-10 DAYS  Discharge activity level: Yes-SUPERVISION LEVEL  Home/community participation: Yes  Services provided included: MD, RD, PT, OT, SLP, RN, CM, TR, Pharmacy and SW  Financial Services: Medicaid and Private Insurance: [employer]-MEDICARE  Follow-up services arranged: Home Health: [facility]-PT,OT,RN and Patient/Family request agency HH: MOM AN ACTIVE PT WITH [facility], DME: HAS ALL EQUIPMENT FROM PREVIOUS ADMITS  Comments (or additional information):MOM HAS BEEN ASSISTING PT PRIOR TO ADMISSION WITH MEDICATION MANAGMENT AND PROVIDING SUPERVISION. PT DOES HAVE MEDICAID WHICH WILL COVER HER MEDICATIONS THEREFORE SHOULD NOT HAVE ANY ISSUES WITH WHICH MEDICATION SHE IS ON FOR HER DIABETES. MOM HERE FOR FAMILY EDUCATION TODAY AWARE OF THE 24 HR SUPERVISION RECOMMENDATION  Patient/Family verbalized understanding of follow-up arrangements: Yes  Individual responsible for coordination of the follow-up plan: DAISY-MOM  Confirmed correct DME delivered: Dupree, Rebecca G 06/03/2015    Dupree, Rebecca G 

## 2015-06-03 NOTE — Progress Notes (Signed)
Occupational Therapy Discharge Summary  Patient Details  Name: [name] MRN: [id number] Date of Birth: Jul 30, 1960  Today's Date: 06/03/2015 OT Individual Time: 1300-1415 OT Individual Time Calculation (min): 75 min    Patient has met 10 of 10 long term goals due to improved activity tolerance, improved balance, ability to compensate for deficits and improved awareness.  Patient to discharge at overall Supervision level.  Patient's care partner is independent to provide the necessary physical and cognitive assistance at discharge.    Reasons goals not met: NA  Recommendation:  Patient will benefit from ongoing skilled OT services in home health setting to continue to advance functional skills in the area of BADL.  Pt still demonstrates increased fall risk and need supervision for all functional transfers and mobility related to selfcare tasks.  Feel she will benefit from continued OT to progress back to modified independence level.  Pt's mother has been educated on safe assist with all selfcare tasks.    Equipment: No equipment provided  Reasons for discharge: treatment goals met and discharge from hospital  Patient/family agrees with progress made and goals achieved: Yes  OT Discharge Precautions/Restrictions  Precautions Precautions: Fall Restrictions Weight Bearing Restrictions: No  Pain Pain Assessment Pain Assessment: No/denies pain ADL  See Function Section of chart  Vision/Perception  Vision- History Baseline Vision/History: Wears glasses Wears Glasses: Reading only Patient Visual Report: No change from baseline Vision- Assessment Vision Assessment?: Yes;No apparent visual deficits Eye Alignment: Within Functional Limits Ocular Range of Motion: Within Functional Limits Convergence: Within functional limits Visual Fields: No apparent deficits Additional Comments: Pt's diplopia has resolved completely at this time.    Cognition Overall Cognitive Status: Within  Functional Limits for tasks assessed Arousal/Alertness: Awake/alert Orientation Level: Oriented X4 Attention: Selective Selective Attention: Appears intact Memory: Appears intact Memory Impairment: Storage deficit Decreased Short Term Memory: Functional basic;Functional complex Awareness: Appears intact Problem Solving: Appears intact Safety/Judgment: Appears intact Comments: Pt reports short term memory issues from previous CVA. Sensation Sensation Light Touch: Appears Intact (sensation intact in BUEs) Stereognosis: Impaired Detail Stereognosis Impaired Details: Impaired RUE Hot/Cold: Appears Intact Proprioception: Appears Intact Coordination Gross Motor Movements are Fluid and Coordinated: No Fine Motor Movements are Fluid and Coordinated: No Coordination and Movement Description: Ataxic movement in both the RUE and RLE with mobility and selfcare tasks.     Motor  Motor Motor: Hemiplegia Motor - Discharge Observations: mild R UE and LE hemiplegia Mobility  Transfers Transfers: Sit to Stand;Stand to Sit Sit to Stand: 5: Supervision;With armrests;From chair/3-in-1 Sit to Stand Details: Verbal cues for sequencing Stand to Sit: 5: Supervision Stand to Sit Details (indicate cue type and reason): Verbal cues for sequencing  Trunk/Postural Assessment  Cervical Assessment Cervical Assessment: Exceptions to [facility] (head tilt to the right) Thoracic Assessment Thoracic Assessment: Exceptions to [facility] (slight kyphosis) Lumbar Assessment Lumbar Assessment: Within Functional Limits Postural Control Trunk Control: decreased balalance reactions  Balance Balance Balance Assessed: Yes Static Sitting Balance Static Sitting - Level of Assistance: 6: Modified independent (Device/Increase time) Dynamic Sitting Balance Dynamic Sitting - Balance Support: No upper extremity supported;Feet supported Dynamic Sitting - Level of Assistance: 5: Stand by assistance Static Standing Balance Static  Standing - Balance Support: No upper extremity supported Static Standing - Level of Assistance: 5: Stand by assistance Dynamic Standing Balance Dynamic Standing - Balance Support: Bilateral upper extremity supported Dynamic Standing - Level of Assistance: 5: Stand by assistance Extremity/Trunk Assessment RUE Assessment RUE Assessment: Exceptions to [facility] (Pt with history of  CVA in 2012.  Shoudler flexion AROM 0-90 degrees with pt reporting frozen shoulder.  all other joints AROM WFLs but smoothness of movement is limited secondary to ataxia.  Strength 3+/5) LUE Assessment LUE Assessment: Exceptions to [facility] (Strength 4/5 throught.  AROM shoulder flexion limited to 0-120 degrees.  All other joints AROM WFLS)   See Function Navigator for Current Functional Status.  Damian Buckles OTR/L 06/03/2015, 4:05 PM

## 2015-06-04 DIAGNOSIS — E119 Type 2 diabetes mellitus without complications: Secondary | ICD-10-CM

## 2015-06-04 LAB — GLUCOSE, CAPILLARY: Glucose-Capillary: 172 mg/dL — ABNORMAL HIGH (ref 65–99)

## 2015-06-04 NOTE — Progress Notes (Signed)
Subjective/Complaints: Patient seen this morning resting comfortably in bed. She states he had a good night. She is eager to go home.  ROS- Denies N/V, diarrhea, contipation, no CP or SOB  Objective: Vital Signs: Blood pressure 117/57, pulse 75, temperature 97.4 F (36.3 C), temperature source Oral, resp. rate 18, height 5\' 4"  (1.626 m), weight 69.5 kg (153 lb 3.5 oz), last menstrual period 03/13/2011, SpO2 99 %. No results found. Results for orders placed or performed during the hospital encounter of 05/25/15 (from the past 72 hour(s))  Glucose, capillary     Status: Abnormal   Collection Time: 06/01/15 12:21 PM  Result Value Ref Range   Glucose-Capillary 303 (H) 65 - 99 mg/dL  Glucose, capillary     Status: Abnormal   Collection Time: 06/01/15  4:38 PM  Result Value Ref Range   Glucose-Capillary 303 (H) 65 - 99 mg/dL  Glucose, capillary     Status: Abnormal   Collection Time: 06/01/15  9:32 PM  Result Value Ref Range   Glucose-Capillary 297 (H) 65 - 99 mg/dL  Glucose, capillary     Status: Abnormal   Collection Time: 06/02/15  6:49 AM  Result Value Ref Range   Glucose-Capillary 290 (H) 65 - 99 mg/dL  Glucose, capillary     Status: Abnormal   Collection Time: 06/02/15 12:10 PM  Result Value Ref Range   Glucose-Capillary 324 (H) 65 - 99 mg/dL  Glucose, capillary     Status: Abnormal   Collection Time: 06/02/15  4:43 PM  Result Value Ref Range   Glucose-Capillary 380 (H) 65 - 99 mg/dL  Glucose, capillary     Status: Abnormal   Collection Time: 06/02/15  8:39 PM  Result Value Ref Range   Glucose-Capillary 232 (H) 65 - 99 mg/dL  Glucose, capillary     Status: Abnormal   Collection Time: 06/03/15  6:37 AM  Result Value Ref Range   Glucose-Capillary 181 (H) 65 - 99 mg/dL  Glucose, capillary     Status: Abnormal   Collection Time: 06/03/15 11:34 AM  Result Value Ref Range   Glucose-Capillary 254 (H) 65 - 99 mg/dL   Comment 1 Notify RN   Glucose, capillary     Status:  Abnormal   Collection Time: 06/03/15  4:26 PM  Result Value Ref Range   Glucose-Capillary 115 (H) 65 - 99 mg/dL   Comment 1 Notify RN   Glucose, capillary     Status: Abnormal   Collection Time: 06/03/15  8:46 PM  Result Value Ref Range   Glucose-Capillary 168 (H) 65 - 99 mg/dL  Glucose, capillary     Status: Abnormal   Collection Time: 06/04/15  6:39 AM  Result Value Ref Range   Glucose-Capillary 172 (H) 65 - 99 mg/dL      General: No acute distress. Vital signs reviewed. Psych: Mood and affect are appropriate Eyes- non injected, keeps Left eye closed  Heart: Regular rate and rhythm no rubs murmurs or extra sounds Lungs: Clear to auscultation, breathing unlabored, no rales or wheezes Abdomen: Positive bowel sounds, soft nontender to palpation, nondistended Skin: No evidence of breakdown, no evidence of rash Neurologic: Cranial nerve IV palsy Motor: RUE/RLE: 4+/5 proximal distal. Ataxia.  LUE/LLE: 5/5 proximal to distal  Musculoskeletal: No joint swelling. No edema. No tenderness   Assessment/Plan: 1. Functional deficits secondary to Left paracentral midbrain and pontine infarcts which require 3+ hours per day of interdisciplinary therapy in a comprehensive inpatient rehab setting. Physiatrist is providing close  team supervision and 24 hour management of active medical problems listed below. Physiatrist and rehab team continue to assess barriers to discharge/monitor patient progress toward functional and medical goals. FIM: Function - Bathing Position: Shower Body parts bathed by patient: Right arm, Left arm, Chest, Abdomen, Front perineal area, Buttocks, Right upper leg, Left upper leg, Right lower leg, Left lower leg Body parts bathed by helper: Back Bathing not applicable: Back Assist Level: More than reasonable time  Function- Upper Body Dressing/Undressing What is the patient wearing?: Pull over shirt/dress Pull over shirt/dress - Perfomed by patient: Thread/unthread  right sleeve, Thread/unthread left sleeve, Put head through opening, Pull shirt over trunk Pull over shirt/dress - Perfomed by helper: Pull shirt over trunk Assist Level: Supervision or verbal cues Function - Lower Body Dressing/Undressing What is the patient wearing?: Pants, Underwear, Shoes, Ted Hose Position: Sitting EOB Underwear - Performed by patient: Thread/unthread right underwear leg, Thread/unthread left underwear leg, Pull underwear up/down Pants- Performed by patient: Thread/unthread right pants leg, Thread/unthread left pants leg, Pull pants up/down Non-skid slipper socks- Performed by patient: Don/doff right sock, Don/doff left sock Socks - Performed by patient: Don/doff right sock, Don/doff left sock Socks - Performed by helper: Don/doff right sock, Don/doff left sock Shoes - Performed by patient: Don/doff right shoe, Don/doff left shoe, Fasten right Shoes - Performed by helper: Don/doff right shoe, Don/doff left shoe, Fasten right, Fasten left TED Hose - Performed by patient: Don/doff right TED hose, Don/doff left TED hose TED Hose - Performed by helper: Don/doff right TED hose, Don/doff left TED hose Assist for footwear: Setup Assist for lower body dressing: Supervision or verbal cues  Function - Toileting Toileting steps completed by patient: Adjust clothing prior to toileting, Performs perineal hygiene, Adjust clothing after toileting Toileting Assistive Devices: Grab bar or rail Assist level: Supervision or verbal cues  Function - Archivist transfer assistive device: Grab bar, Elevated toilet seat/BSC over toilet Assist level to toilet: Supervision or verbal cues Assist level from toilet: Supervision or verbal cues Assist level to bedside commode (at bedside): Supervision or verbal cues Assist level from bedside commode (at bedside): Supervision or verbal cues  Function - Chair/bed transfer Chair/bed transfer method: Ambulatory Chair/bed transfer  assist level: Supervision or verbal cues Chair/bed transfer assistive device: Armrests, Walker Chair/bed transfer details: Verbal cues for safe use of DME/AE  Function - Locomotion: Wheelchair Will patient use wheelchair at discharge?: No Type: Manual Max wheelchair distance: 100 Assist Level: Supervision or verbal cues Assist Level: Supervision or verbal cues Assist Level: Supervision or verbal cues Turns around,maneuvers to table,bed, and toilet,negotiates 3% grade,maneuvers on rugs and over doorsills: No Function - Locomotion: Ambulation Assistive device: Walker-rolling Max distance: 200 Assist level: Supervision or verbal cues Assist level: Supervision or verbal cues Assist level: Supervision or verbal cues Assist level: Supervision or verbal cues Assist level: Supervision or verbal cues  Function - Comprehension Comprehension: Auditory Comprehension assist level: Understands complex 90% of the time/cues 10% of the time  Function - Expression Expression: Verbal Expression assist level: Expresses basic needs/ideas: With no assist  Function - Social Interaction Social Interaction assist level: Interacts appropriately 90% of the time - Needs monitoring or encouragement for participation or interaction.  Function - Problem Solving Problem solving assist level: Solves basic 25 - 49% of the time - needs direction more than half the time to initiate, plan or complete simple activities  Function - Memory Memory assist level: Recognizes or recalls less than 25% of the time/requires  cueing greater than 75% of the time Patient normally able to recall (first 3 days only): Current season  Medical Problem List and Plan: 1.  Diplopia, gait deficits secondary to left paracentral-midbrain and posterior left paracentral pontine infarct  DC today 2.  DVT Prophylaxis/Anticoagulation: Subcutaneous Lovenox. Monitor platelet counts and any signs of bleeding 3. Pain Management: Cymbalta 30 mg  daily 4. Mood: Celexa 20 mg daily. Provide emotional support 5. Neuropsych: This patient is capable of making decisions on her own behalf.   6. Skin/Wound Care: Routine skin checks  7. Fluids/Electrolytes/Nutrition: Routine I&O   8. Hypertension. Coreg 6.25 mg twice a day, Lanoxin 0.0625 mg every other day.  9. Chronic systolic congestive heart failure with ischemic cardiomyopathy/NSTEMI. Lasix 20 mg daily.  10. Diabetes mellitus and peripheral neuropathy. Hemoglobin A1c 11. Appreciate diabetes coordinator note CBG (last 3)   Recent Labs  06/03/15 1626 06/03/15 2046 06/04/15 0639  GLUCAP 115* 168* 172*   decreased Novalog meal coverage to 3U, home [place] 20U per day family will check blood sugars before meals and at bedtime. Diabetic teaching, sensitive sliding scale, f/u Endo next week 11. Iron deficiency anemia. Resolved on Ferrous sulfate 650 mg daily 12. Hyperlipidemia. Crestor- no myalgias  LOS (Days) 10 A FACE TO FACE EVALUATION WAS PERFORMED  Alixis Harmon Karis Juba 06/04/2015, 12:10 PM

## 2015-06-04 NOTE — Progress Notes (Signed)
Pt. Got her d/c papers.Pt. Ready to go home with her mother.

## 2015-06-07 MED FILL — Insulin Degludec Soln Pen-Injector 200 Unit/ML: SUBCUTANEOUS | Qty: 9 | Status: AC

## 2015-06-09 ENCOUNTER — Encounter: Payer: Self-pay | Admitting: Cardiology

## 2015-06-09 ENCOUNTER — Ambulatory Visit (INDEPENDENT_AMBULATORY_CARE_PROVIDER_SITE_OTHER): Payer: Medicare Other | Admitting: Cardiology

## 2015-06-09 ENCOUNTER — Telehealth (HOSPITAL_COMMUNITY): Payer: Self-pay | Admitting: Vascular Surgery

## 2015-06-09 VITALS — BP 122/74 | HR 74 | Ht 64.0 in | Wt 156.0 lb

## 2015-06-09 DIAGNOSIS — I5022 Chronic systolic (congestive) heart failure: Secondary | ICD-10-CM

## 2015-06-09 DIAGNOSIS — Z79899 Other long term (current) drug therapy: Secondary | ICD-10-CM

## 2015-06-09 DIAGNOSIS — I6350 Cerebral infarction due to unspecified occlusion or stenosis of unspecified cerebral artery: Secondary | ICD-10-CM

## 2015-06-09 DIAGNOSIS — I639 Cerebral infarction, unspecified: Secondary | ICD-10-CM

## 2015-06-09 DIAGNOSIS — I2510 Atherosclerotic heart disease of native coronary artery without angina pectoris: Secondary | ICD-10-CM | POA: Diagnosis not present

## 2015-06-09 LAB — BASIC METABOLIC PANEL
BUN: 22 mg/dL (ref 7–25)
CALCIUM: 9.5 mg/dL (ref 8.6–10.4)
CO2: 29 mmol/L (ref 20–31)
Chloride: 101 mmol/L (ref 98–110)
Creat: 1.61 mg/dL — ABNORMAL HIGH (ref 0.50–1.05)
GLUCOSE: 431 mg/dL — AB (ref 65–99)
POTASSIUM: 4.7 mmol/L (ref 3.5–5.3)
Sodium: 135 mmol/L (ref 135–146)

## 2015-06-09 MED ORDER — CARVEDILOL 6.25 MG PO TABS: 9.3750 mg | ORAL_TABLET | Freq: Two times a day (BID) | ORAL | Status: DC

## 2015-06-09 NOTE — Patient Instructions (Addendum)
Medication Instructions:  Your physician has recommended you make the following change in your medication:  1) INCREASE Carvedilol to 9.375 mg twice a day    (take 1.5 tablets twice a day)  Labwork: Today: BMET, BNP, & Digoxin level  Testing/Procedures: None ordered  Follow-Up: You have been referred to EP to discuss possible loop recorder.  Your physician recommends that you schedule a follow-up appointment in: 3 months with Dr. Shirlee Latch in the [facility] Failure [facility].  If you need a refill on your cardiac medications before your next appointment, please call your pharmacy.  Thank you for choosing [facility]!!

## 2015-06-09 NOTE — Progress Notes (Signed)
Patient ID: [name], female   DOB: 1960/06/19, 55 y.o.   MRN: [id number] PCP: Dr. Evlyn Kanner  55 yo with history of Type I diabetes, CVA, CAD, and ischemic cardiomyopathy presents for cardiology followup.  She had a prolonged admission with DKA and systolic CHF in 12/11.  Cardiac enzymes were found to be elevated and left heart cath showed occluded LAD, which appeared chronic at the time of cath.  Echo showed peri-apical akinesis with EF 30%.  Management was complicated by orthostatic hypotension causing syncope.  This was likely due to combination of low cardiac output and diabetic autonomic neuropathy.  This limited titration of cardiac medications initially. Repeat echo (1/12) showed EF 40-45% (improved).  She was readmitted with DKA in 3/12, and cardiac enzymes were noted to be quite elevated with troponin peaking at 24 and CKMB at 69.  Repeat LHC was done, actually showing no significant difference from the 12/11 study.    In 5/12, she noted weakness one day in her right leg and arm.  She had a head MRI done, showing a small acute left parietal infarction.  This stroke occurred while on ASA and Plavix.  She has improved but her left arm still has some weakness. Carotid dopplers showed no significant carotid stenosis.  I did a limited echo to look for LV thrombus, and this was not seen.  3 week event monitor showed no atrial fibrillation.    Echo in 5/14 showed EF 40%.  We had to cut back on her cardiac meds due to hyperkalemia and hypotension.  She is now off ACEI completely (was hypotensive with even a low dose).  She was admitted again in 12/14 with DKA and elevated troponin (peak 2).  No chest pain.  Echo in 12/14 showed EF down to 25-30% but wall motion abnormality pattern was the same.  Given elevated creatinine, no chest pain, and very elevated troponin at prior admit with DKA without new coronary disease, I did not cath her.  Repeat echo in 3/15 showed EF 40-45% with anteroseptal and apical severe  hypokinesis to akinesis.    In 1/17, she was admitted with recurrent CVA, this time involving the left midbrain and pons.  She noted diplopia. Workup in the hospital showed unremarkable carotid dopplers and stable echo with EF 35-40%.  No arrhythmias were noted on monitoring. She went to CIR.   She is now home from CIR.  She feels like she is back to baseline.  Walks with walker for balance.  No exertional dyspnea. No chest pain.  No orthopnea/PND.  No tachypalpitations.  No falls or lightheadedness.    Labs (12/11): K 3.7, creatinine 0.88, BNP 612, LDL 29, HDL 44 Labs (1/12): digoxin 1.0, BNP 199, K 4.4, creatinine 0.9 Labs (5/12): digoxin 0.4 Labs (6/12): K 4.2, creatinine 0.99 Labs (7/12): K 6.2, creatinine 1.3 (KCl and spironolactone held).  Labs (7/12), repeat: K 4.8, creatinine 1.2, BNP 173 Labs (11/12): K 4.6, creatinine 1.6 => 1.3, TnI 0.7 Labs (12/12): K 4.9, creatinine 1.5, LDL 76, HDL 55, BNP 103, digoxin 1.0 Labs (3/13): K 4.2, creatinine 1.1, digoxin 1.0 Labs (6/13): LDL 57, HDL 51, digoxin 0.3 Labs (40/98): K 4.4, creatinine 1.4 Labs (1/14): BNP 253 => 108, K 4.4, creatinine 1.3 Labs (4/14): K 3.7, creatinine 1.2, LDL 61, HDL 57 Labs (12/14): K 4.2, creatinine 1.79, digoxin 0.7 Labs (2/15): K 4.8, creatinine 1.07, digoxin 0.4, HCT 41.5, LFTs normal Labs (3/15): LDL 64, HDL 52 Labs (7/15): K 4.7, creatinine 1.25,  digoxin 0.4 Labs (11/15): digoxin 0.5 Labs (5/16): K 4, creatinine 1.92, HCT 43 Labs (7/16): LDL 53, HDL 43 Labs (1/17): K 4.2, creatinine 1.12  ECG (1/17) with NSR, nonspecific T wave flattening, PVC  Allergies (verified):  1)  Reglan 2)  Erythromycin  Past Medical History: 1. Hypertension: However, after MI had orthostatic hypotension.  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 CAD: NSTEMI 12/11.  Cardiac catheterization (12/11) showing LAD totalled in the midportion, diagonal 1 70% stenosed (small 2-mm vessel), circumflex nonobstructive, OM1  40%, OM2 30%, RCA small and no significant disease.  LAD occlusion was calcified and appeared chronic at the time of catheterization so no intervention.  Patient readmitted with DKA in 3/12, troponin found to be 24 and CKMB 69.  Repeat LHC showed no change from 12/11 study.  5. Ischemic cardiomyopathy: Echo (12/11) with EF 30% and periapical akinesis, no significant MR, RV looked normal.  QRS is not wide on ECG.  Echo (1/12): EF 40-45%, severe hypokinesis of the anteroseptal wall, apical inferior wall, inferoseptal wall, and apex.  Hyperkalemia with spironolactone and with > 2.5 mg daily enalapril.  Echo 7/12 with EF 40-45%.  Echo (5/14): EF 40%, mid-apical anteroseptal hypokinesis, grade II diastolic dysfunction. Echo (12/14) with EF 25-30%, periapical akinesis. Echo (3/15) with EF 40-45%, anteroseptal and apical severe hypokinesis to akinesis. Echo (1/17) with EF 35-40%, akinesis anteroseptal wall and apex.  6. Orthostatic hypotension: Syncopal episode in 12/11 likely due to this.  Probably from combination of depressed cardiac output and diabetic autonomic neuropathy.  7. Type 1 insulin-dependent diabetes. History of DKA.  8. Hyperlipidemia.  9. Diabetic retinopathy.  10. Gastroparesis.  11. Diabetic nephropathy  12. Depression.  13. 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  Carotid dopplers (7/12) with no significant disease.  Limited echo in 7/12 did not show an LV apical thrombus.  3-week event monitor in 7/12 did not show any runs of atrial fibrillation.  CVA (1/17): Left midbrain and pons.  Carotid US (1/17) without significant stenosis.   Family History: No FH of Colon Cancer: Family History of Pancreatic Cancer: Father deceased 100 No premature CAD  Social History: Divorced, 1 boy unemployed, lives in [place] Patient has never smoked.  Alcohol Use - no Daily Caffeine Use 1 cup/day Illicit Drug Use - no  ROS: All systems reviewed and negative except as per HPI.    Current  Outpatient Prescriptions  Medication Sig Dispense Refill  . aspirin EC 81 MG tablet Take 81 mg by mouth at bedtime.     . Calcium Carb-Cholecalciferol (CALCIUM 500 +D PO) Take 500 mg by mouth daily with lunch.    . carvedilol (COREG) 6.25 MG tablet Take 1.5 tablets (9.375 mg total) by mouth 2 (two) times daily. 60 tablet 4  . citalopram (CELEXA) 20 MG tablet Take 1 tablet (20 mg total) by mouth daily. 30 tablet 1  . clopidogrel (PLAVIX) 75 MG tablet Take 1 tablet (75 mg total) by mouth daily. 30 tablet 6  . digoxin (LANOXIN) 0.125 MG tablet Take 0.5 tablets (0.0625 mg total) by mouth every other day. 30 tablet 1  . DULoxetine (CYMBALTA) 30 MG capsule Take 1 capsule (30 mg total) by mouth daily. 30 capsule 3  . ferrous sulfate 325 (65 FE) MG tablet Take 2 tablets (650 mg total) by mouth daily with supper. 30 tablet 3  . furosemide (LASIX) 20 MG tablet TAKE 1 TABLET (20 MG TOTAL) BY MOUTH DAILY. 30 tablet 6  .  Insulin Degludec (TRESIBA FLEXTOUCH) 200 UNIT/ML SOPN Inject 22 Units into the skin daily at 12 noon.    [name] LORazepam (ATIVAN) 0.5 MG tablet Take 1 tablet by mouth 2 (two) times daily as needed.  2  . Multiple Vitamin (MULTIVITAMIN WITH MINERALS) TABS tablet Take 1 tablet by mouth daily with lunch.    . rosuvastatin (CRESTOR) 40 MG tablet Take 1 tablet (40 mg total) by mouth daily with supper. 30 tablet 11  . Vitamin D, Ergocalciferol, (DRISDOL) 50000 UNITS CAPS Take 50,000 Units by mouth every 7 (seven) days. Saturday     No current facility-administered medications for this visit.    BP 122/74 mmHg  Pulse 74  Ht 5\' 4"  (1.626 m)  Wt 156 lb (70.761 kg)  BMI 26.76 kg/m2  LMP 03/13/2011 General:  Well developed, well nourished, in no acute distress. Neck:  Neck thick, JVP difficult, suspect not elevated. No masses, thyromegaly or abnormal cervical nodes. Lungs:  Crackles right base Heart:  Non-displaced PMI, chest non-tender; regular rate and rhythm, S1, S2 without murmurs, rubs or  gallops. Carotid upstroke normal, no bruit. Pedals normal pulses. No edema.   Abdomen:  Bowel sounds positive; abdomen soft and non-tender without masses, organomegaly, or hernias noted. No hepatosplenomegaly. Extremities:  No clubbing or cyanosis. Neurologic:  Alert and oriented x 3. Right arm is weak.  Psych:  Normal affect.  Assessment/Plan:  CHRONIC SYSTOLIC HEART FAILURE  Ischemic cardiomyopathy.  EF 25-30% on 12/14 echo.  This was lower than the prior, but was in the setting of acute DKA which may have affected her LV function. Followup echo in 3/15 showed EF 40-45% with anteroseptal and apical wall motion abnormalities.  This is more in line with prior echoes. Most recent echo in 1/17 showed EF 35-40%.  She remains out of range for ICD placement.  She is off spironolactone as she developed hyperkalemia while taking this medication and enalapril was stopped due to low BP. Volume status looks ok by echo.  - I will continue her on the current doses of digoxin and Lasix.  Check BMET, BNP and digoxin level. - Increase Coreg to 9.375 mg bid.  CAD Known coronary disease with occluded mid-LAD.  She had troponin peaking at 2 during 12/14 admission with DKA.  She did not have chest pain and wall motion abnormality pattern on echo was similar to the past.  Creatinine was elevated.  She had a similar DKA event in 3/12 with very elevated TnI and catheterization showing no changes in her coronary disease.   I elected not to re-cath her in 12/14.  She denies any chest pain or dyspnea currently. Continue ASA 81, Plavix, and Crestor.   CVA  She has now had 2 CVAs. Carotid dopplers showed no significant disease and echo did not show LV apical thrombus. She has been on ASA 81 and Plavix.  At this point, I think that she needs prolonged monitoring for atrial fibrillation.  I will refer her to EP for [facility] monitor placement.  HYPERLIPIDEMIA  Continue Crestor.  Good lipids in 7/16.     Followup in 3 months in CHF  clinic.     Marca Ancona 06/09/2015

## 2015-06-09 NOTE — Telephone Encounter (Signed)
Left pt message to make 3 month f/u appt w/ [facility]

## 2015-06-10 ENCOUNTER — Telehealth (HOSPITAL_COMMUNITY): Payer: Self-pay | Admitting: *Deleted

## 2015-06-10 LAB — BRAIN NATRIURETIC PEPTIDE: Brain Natriuretic Peptide: 30.2 pg/mL (ref <100)

## 2015-06-10 LAB — DIGOXIN LEVEL: DIGOXIN LVL: 0.5 ug/L — AB (ref 0.8–2.0)

## 2015-06-10 NOTE — Telephone Encounter (Signed)
Received call from [facility] lab, she states they have critical lab values on pt and called the [address] office but they would not take results and told her to call here, although pt was seen at [address] office.  I took critical value of glucose 431, will have Dr Shirlee Latch review.  I attempted to call pt to check on her and left a VM for her to call back.

## 2015-06-10 NOTE — Telephone Encounter (Signed)
Spoke w/pt's mother she is aware of glucose level, she states pt actually saw Dr Evlyn Kanner this morning regarding DM

## 2015-06-13 ENCOUNTER — Encounter: Payer: Self-pay | Admitting: Cardiology

## 2015-06-14 ENCOUNTER — Encounter: Payer: Self-pay | Admitting: Internal Medicine

## 2015-06-14 ENCOUNTER — Ambulatory Visit (INDEPENDENT_AMBULATORY_CARE_PROVIDER_SITE_OTHER): Payer: Medicare Other | Admitting: Internal Medicine

## 2015-06-14 VITALS — BP 92/50 | HR 79 | Ht 64.0 in | Wt 155.4 lb

## 2015-06-14 DIAGNOSIS — I639 Cerebral infarction, unspecified: Secondary | ICD-10-CM | POA: Diagnosis not present

## 2015-06-14 NOTE — Patient Instructions (Addendum)
Medication Instructions:  Your physician recommends that you continue on your current medications as directed. Please refer to the Current Medication list given to you today.   Labwork: None ordered   Testing/Procedures: LINQ implant on 06/27/15.  Please arrive at [facility] Entrance at 8:30am.    Follow-Up:  Your physician recommends that you schedule a follow-up appointment in: 7-10 days from 06/27/15 in device clinic for wound check and 3 months from 06/27/15 with Dr Ladona Ridgel   Any Other Special Instructions Will Be Listed Below (If Applicable).     If you need a refill on your cardiac medications before your next appointment, please call your pharmacy.

## 2015-06-14 NOTE — Progress Notes (Signed)
HPI Mrs. Tomei is referred today for evaluation of cryptogenic stroke. She is a pleasant 55 yo woman with CAD, s/p NSTEMI with fairly severe occlusions, s/p cath with no PCI,  and cryptogenic stroke. She has residual right HP. She has class 2 CHF and moderate LV dysfunction with an EF of 35-40%. She has not had syncope. No edema. She denies anginal symptoms.  Allergies  Allergen Reactions  . Erythromycin Other (See Comments)    GI upset  . Food Nausea Only    Green peas  . Metoclopramide Hcl Other (See Comments)     fatique, depression     Current Outpatient Prescriptions  Medication Sig Dispense Refill  . aspirin EC 81 MG tablet Take 81 mg by mouth at bedtime.     . Calcium Carb-Cholecalciferol (CALCIUM 500 +D PO) Take 500 mg by mouth daily with lunch.    . carvedilol (COREG) 6.25 MG tablet Take 1.5 tablets (9.375 mg total) by mouth 2 (two) times daily. 60 tablet 4  . citalopram (CELEXA) 20 MG tablet Take 1 tablet (20 mg total) by mouth daily. 30 tablet 1  . clopidogrel (PLAVIX) 75 MG tablet Take 1 tablet (75 mg total) by mouth daily. 30 tablet 6  . digoxin (LANOXIN) 0.125 MG tablet Take 0.5 tablets (0.0625 mg total) by mouth every other day. 30 tablet 1  . DULoxetine (CYMBALTA) 30 MG capsule Take 1 capsule (30 mg total) by mouth daily. 30 capsule 3  . ferrous sulfate 325 (65 FE) MG tablet Take 2 tablets (650 mg total) by mouth daily with supper. 30 tablet 3  . furosemide (LASIX) 20 MG tablet TAKE 1 TABLET (20 MG TOTAL) BY MOUTH DAILY. 30 tablet 6  . Insulin Degludec (TRESIBA FLEXTOUCH) 200 UNIT/ML SOPN Inject 22 Units into the skin daily at 12 noon.    . insulin lispro (HUMALOG) 100 UNIT/ML injection Inject SS subQ as directed    . LORazepam (ATIVAN) 0.5 MG tablet Take 1 tablet by mouth 2 (two) times daily as needed.  2  . Multiple Vitamin (MULTIVITAMIN WITH MINERALS) TABS tablet Take 1 tablet by mouth daily with lunch.    . rosuvastatin (CRESTOR) 40 MG tablet Take 1 tablet (40  mg total) by mouth daily with supper. 30 tablet 11  . Vitamin D, Ergocalciferol, (DRISDOL) 50000 UNITS CAPS Take 50,000 Units by mouth every 7 (seven) days. Saturday     No current facility-administered medications for this visit.     Past Medical History  Diagnosis Date  . Hypertension   . Vitamin D deficiency   . Coronary artery disease     a. (12/11) NSTEMI, in the setting of DKA, s/p Cath--LAD totalled in the midportion, diag 70% sten (small 2-mm ), LCx nonobst, OM1 40%, OM2 30%, RCA- no dz. chronic calcified LAD --> no intervention.  b. (06/2010) NSTEMI s/p cath showing same findings   . Ischemic cardiomyopathy   . Orthostatic hypotension     a. sycopal episode in 12/11   . Diabetes mellitus     a. Insulin dependant Type I   . Hyperlipidemia   . Diabetic nephropathy (HCC)   . Diabetic retinopathy   . Gastroparesis   . Depression   . Systolic heart failure     a. (08/2012) EF: 40% Mod hypokin in mid-dist-ant-sept myocard--> psuedonml L vent fill pattern, Grd 2 diast dyscfxn  . Iron deficiency anemia   . Hx MRSA infection   . Osteopenia   . Esophagitis   .  NSTEMI (non-ST elevated myocardial infarction) (HCC)     a. (03/2010) NSTEMI during admission for DKA  b.  . Stroke [facility])     a. (2012) residual R hand/arm weakness  b. while on ASA and plavix  . Diabetic neuropathy (HCC)     ROS:   All systems reviewed and negative except as noted in the HPI.   Past Surgical History  Procedure Laterality Date  . Appendectomy  1980  . Tubal ligation  1987  . Cesarean section  1984  . Refractive surgery      x3     Family History  Problem Relation Age of Onset  . Pancreatic cancer Father   . Colon cancer Neg Hx   . Diabetes Maternal Grandfather      Social History   Social History  . Marital Status: Single    Spouse Name: N/A  . Number of Children: 1  . Years of Education: N/A   Occupational History  . unemployed    Social History Main Topics  . Smoking status:  Never Smoker   . Smokeless tobacco: Never Used  . Alcohol Use: No  . Drug Use: No  . Sexual Activity: Not on file   Other Topics Concern  . Not on file   Social History Narrative   Divorced-1 son     BP 92/50 mmHg  Pulse 79  Ht  (1.626 m)  Wt 155 lb 6.4 oz (70.489 kg)  BMI 26.66 kg/m2  SpO2 98%  LMP 03/13/2011  Physical Exam:  stable appearing 55 yo woman, NAD HEENT: Unremarkable Neck:  6 cm JVD, no thyromegally Back:  No CVA tenderness Lungs:  Clear with no wheezes, rales, or rhonchi HEART:  Regular rate rhythm, no murmurs, no rubs, no clicks Abd:  soft, positive bowel sounds, no organomegally, no rebound, no guarding Ext:  2 plus pulses, no edema, no cyanosis, no clubbing Skin:  No rashes no nodules Neuro:  CN II through XII intact, right HP  EKG - reviewed - nsr  Assess/Plan: 1. Cryptogenic stroke - I have discussed the indications for insertion of an ILR. She is willing to proceed. 2. Chronic systolic heart failure - her symptoms are class 2. Will follow. 3. CAD - she is s/p MI with modest LV dysfunction but no angina.  Leonia Reeves.D.

## 2015-06-15 ENCOUNTER — Other Ambulatory Visit: Payer: Self-pay | Admitting: *Deleted

## 2015-06-15 MED ORDER — CARVEDILOL 6.25 MG PO TABS
9.3750 mg | ORAL_TABLET | Freq: Two times a day (BID) | ORAL | Status: DC
Start: 2015-06-15 — End: 2016-03-18

## 2015-06-18 ENCOUNTER — Other Ambulatory Visit: Payer: Self-pay | Admitting: Internal Medicine

## 2015-06-23 ENCOUNTER — Encounter: Payer: Self-pay | Admitting: Nurse Practitioner

## 2015-06-23 ENCOUNTER — Ambulatory Visit (INDEPENDENT_AMBULATORY_CARE_PROVIDER_SITE_OTHER): Payer: Medicare Other | Admitting: Nurse Practitioner

## 2015-06-23 VITALS — BP 94/76 | HR 72 | Ht 64.0 in | Wt 156.2 lb

## 2015-06-23 DIAGNOSIS — I69359 Hemiplegia and hemiparesis following cerebral infarction affecting unspecified side: Secondary | ICD-10-CM | POA: Diagnosis not present

## 2015-06-23 DIAGNOSIS — I10 Essential (primary) hypertension: Secondary | ICD-10-CM | POA: Diagnosis not present

## 2015-06-23 DIAGNOSIS — I6302 Cerebral infarction due to thrombosis of basilar artery: Secondary | ICD-10-CM

## 2015-06-23 DIAGNOSIS — E119 Type 2 diabetes mellitus without complications: Secondary | ICD-10-CM | POA: Diagnosis not present

## 2015-06-23 NOTE — Progress Notes (Signed)
[facility]  PATIENT: [name] DOB: 10-25-1960   REASON FOR VISIT: Hospital follow-up for stroke  HISTORY FROM:patient and mom    HISTORY OF PRESENT ILLNESS:[name], 55 year old female is here for hospital follow-up after sustaining a stroke. On 05/22/2015 she was having trouble using her walker and she fell. The day before she had  double vision. She was also feeling quite weak and had a history of prior stroke with right-sided weakness but has not noticed any focal acute weakness or numbness no dysarthria and no other visual changes. She has a history of chronic systolic heart failure coronary artery disease which is medically managed diabetes on insulin hyperlipidemia and hypertension. MRI brain showed acute nonhemorrhagic infarct in the left paracentral midbrain and posterior left paracentral pons. Remote thalamic infarcts and chronic small vessel disease changes. CTA of the head with no major intracranial arterial occlusion. Mild Intracranial atherosclerosis with mild right ICA stenosis. Echocardiogram with ejection fraction of 40%, systolic function and moderately reduced in no source of emboli identified. Patient did not receive TPA due to timeframes. Carotid Doppler without evidence of internal carotid artery stenosis bilaterally. She is currently on Plavix and aspirin for secondary stroke prevention. Her therapies have concluded.She has not had further stroke or TIA symptoms. She continues to ambulate with a walker which she was using prior to this. She does snore at night however she denies any daytime sleepiness. She returns for reevaluation   REVIEW OF SYSTEMS: Full 14 system review of systems performed and notable only for those listed, all others are neg:  Constitutional: neg  Cardiovascular: neg Ear/Nose/Throat: neg  Skin: neg Eyes: neg Respiratory: neg Gastroitestinal: neg  Hematology/Lymphatic: neg  Endocrine:  neg Musculoskeletal:neg Allergy/Immunology: neg Neurological: weakness, memory loss Psychiatric: neg Sleep : neg   ALLERGIES: Allergies  Allergen Reactions  . Erythromycin Other (See Comments)    GI upset  . Food Nausea Only    Green peas  . Metoclopramide Hcl Other (See Comments)     fatique, depression    HOME MEDICATIONS: Outpatient Prescriptions Prior to Visit  Medication Sig Dispense Refill  . aspirin EC 81 MG tablet Take 81 mg by mouth at bedtime.     . Calcium Carb-Cholecalciferol (CALCIUM 500 +D PO) Take 500 mg by mouth daily with lunch.    . carvedilol (COREG) 6.25 MG tablet Take 1.5 tablets (9.375 mg total) by mouth 2 (two) times daily. 270 tablet 2  . citalopram (CELEXA) 20 MG tablet Take 1 tablet (20 mg total) by mouth daily. 30 tablet 1  . clopidogrel (PLAVIX) 75 MG tablet Take 1 tablet (75 mg total) by mouth daily. 30 tablet 6  . digoxin (LANOXIN) 0.125 MG tablet Take 0.5 tablets (0.0625 mg total) by mouth every other day. 30 tablet 1  . DULoxetine (CYMBALTA) 30 MG capsule Take 1 capsule (30 mg total) by mouth daily. 30 capsule 3  . ferrous sulfate 325 (65 FE) MG tablet Take 2 tablets (650 mg total) by mouth daily with supper. 30 tablet 3  . furosemide (LASIX) 20 MG tablet TAKE 1 TABLET (20 MG TOTAL) BY MOUTH DAILY. 30 tablet 6  . Insulin Degludec (TRESIBA FLEXTOUCH) 200 UNIT/ML SOPN Inject 22 Units into the skin daily at 12 noon.    . insulin lispro (HUMALOG) 100 UNIT/ML injection Inject SS subQ as directed    . LORazepam (ATIVAN) 0.5 MG tablet Take 1 tablet by mouth 2 (two) times daily as needed.  2  . Multiple Vitamin (  MULTIVITAMIN WITH MINERALS) TABS tablet Take 1 tablet by mouth daily with lunch.    . rosuvastatin (CRESTOR) 40 MG tablet Take 1 tablet (40 mg total) by mouth daily with supper. 30 tablet 11  . Vitamin D, Ergocalciferol, (DRISDOL) 50000 UNITS CAPS Take 50,000 Units by mouth every 7 (seven) days. Saturday    . furosemide (LASIX) 20 MG tablet TAKE 1  TABLET (20 MG TOTAL) BY MOUTH DAILY. 30 tablet 5   No facility-administered medications prior to visit.    PAST MEDICAL HISTORY: Past Medical History  Diagnosis Date  . Hypertension   . Vitamin D deficiency   . Coronary artery disease     a. (12/11) NSTEMI, in the setting of DKA, s/p Cath--LAD totalled in the midportion, diag 70% sten (small 2-mm ), LCx nonobst, OM1 40%, OM2 30%, RCA- no dz. chronic calcified LAD --> no intervention.  b. (06/2010) NSTEMI s/p cath showing same findings   . Ischemic cardiomyopathy   . Orthostatic hypotension     a. sycopal episode in 12/11   . Diabetes mellitus     a. Insulin dependant Type I   . Hyperlipidemia   . Diabetic nephropathy (HCC)   . Diabetic retinopathy   . Gastroparesis   . Depression   . Systolic heart failure     a. (08/2012) EF: 40% Mod hypokin in mid-dist-ant-sept myocard--> psuedonml L vent fill pattern, Grd 2 diast dyscfxn  . Iron deficiency anemia   . Hx MRSA infection   . Osteopenia   . Esophagitis   . NSTEMI (non-ST elevated myocardial infarction) (HCC)     a. (03/2010) NSTEMI during admission for DKA  b.  . Stroke [facility])     a. (2012) residual R hand/arm weakness  b. while on ASA and plavix  . Diabetic neuropathy (HCC)     PAST SURGICAL HISTORY: Past Surgical History  Procedure Laterality Date  . Appendectomy  1980  . Tubal ligation  1987  . Cesarean section  1984  . Refractive surgery      x3  . Tonsillectomy      46yr of age    FAMILY HISTORY: Family History  Problem Relation Age of Onset  . Pancreatic cancer Father   . Colon cancer Neg Hx   . Diabetes Maternal Grandfather     SOCIAL HISTORY: Social History   Social History  . Marital Status: Single    Spouse Name: N/A  . Number of Children: 1  . Years of Education: N/A   Occupational History  . unemployed    Social History Main Topics  . Smoking status: Never Smoker   . Smokeless tobacco: Never Used  . Alcohol Use: No  . Drug Use: No  .  Sexual Activity: Not on file   Other Topics Concern  . Not on file   Social History Narrative   Divorced-1 son   Living with mother.  Using walker.     Is having PT with [facility].      PHYSICAL EXAM  Filed Vitals:   06/23/15 0915  BP: 94/76  Pulse: 72  Height: 5\' 4"  (1.626 m)  Weight: 156 lb 3.2 oz (70.852 kg)   Body mass index is 26.8 kg/(m^2).  Generalized: Well developed, in no acute distress  Head: normocephalic and atraumatic,. Oropharynx benign  Neck: Supple, no carotid bruits  Cardiac: Regular rate rhythm, no murmur  Musculoskeletal: No deformity   Neurological examination   Mentation: Alert oriented to time, place, history taking. Attention span  and concentration appropriate. Recent and remote memory intact.  Follows all commands speech and language fluent.   Cranial nerve II-XII: Fundi not visualized due to eye movements.Pupils were equal round reactive to light extraocular movements were full, visual field were full on confrontational test. Facial sensation and strength were normal. hearing was intact to finger rubbing bilaterally. Uvula tongue midline. head turning and shoulder shrug were normal and symmetric.Tongue protrusion into cheek strength was normal. Motor: 4 out of 5 right  UE and LE  proximally and distally 5 out of 5 on the left. Fine finger movements normal,  pronator drift on the right.   Sensory: normal and symmetric to light touch, pinprick, and  Vibration,   Coordination: finger-nose-finger, heel-to-shin bilaterally, no dysmetria, no tremor Reflexes: 1+ upper lower and symmetric, plantar responses were flexor bilaterally. Gait and Station: Rising up from seated position without assistance, wide based stance, steady gait with walker, no difficulty with turns.  DIAGNOSTIC DATA (LABS, IMAGING, TESTING) - I reviewed patient records, labs, notes, testing and imaging myself where available.  Lab Results  Component Value Date   WBC 6.1 05/26/2015   HGB  15.3* 05/26/2015   HCT 43.9 05/26/2015   MCV 88.5 05/26/2015   PLT 205 05/26/2015      Component Value Date/Time   NA 135 06/09/2015 1504   K 4.7 06/09/2015 1504   CL 101 06/09/2015 1504   CO2 29 06/09/2015 1504   GLUCOSE 431* 06/09/2015 1504   BUN 22 06/09/2015 1504   CREATININE 1.61* 06/09/2015 1504   CREATININE 1.20* 06/01/2015 0554   CALCIUM 9.5 06/09/2015 1504   PROT 6.7 05/26/2015 0645   ALBUMIN 3.5 05/26/2015 0645   AST 35 05/26/2015 0645   ALT 27 05/26/2015 0645   ALKPHOS 61 05/26/2015 0645   BILITOT 0.8 05/26/2015 0645   GFRNONAA 50* 06/01/2015 0554   GFRAA 57* 06/01/2015 0554   Lab Results  Component Value Date   CHOL 126 05/23/2015   HDL 45 05/23/2015   LDLCALC 58 05/23/2015   TRIG 115 05/23/2015   CHOLHDL 2.8 05/23/2015   Lab Results  Component Value Date   HGBA1C 11.0* 05/23/2015    ASSESSMENT AND PLAN  55 y.o. year old female  here for hospital follow-up of left paracentral midbrain and posterior left paracentral pontine infarct secondary to small vessel disease with risk factors of hypertension diabetes hyperlipidemia and cardiomyopathy.The patient is a current patient of Dr. Roda Shutters who is out of the office today . This note is sent to the work in doctor.     PLAN: Management of risk factors to prevent recurrent stroke Continue Plavix and aspirin for secondary stroke prevention  Loop recorder to be placed to monitor for atrial fibrillation  Continue to check blood sugars CBG this morning 119   LDL 58, continue statin drug  Crestor  Blood pressure 94/76 in the office please make sure you   Getting fluids to prevent dehydration   Continue home exercise program after therapy discharge May need sleep study Follow-up in 3 months Nilda Riggs, [facility], [facility], APRN  [facility] [address], [address] [place], [place] [address] [phone]

## 2015-06-23 NOTE — Patient Instructions (Signed)
Management of risk factors to prevent recurrent stroke Continue Plavix and aspirin for secondary stroke prevention  Loop recorder to be placed to monitor for atrial fibrillation  Continue to check blood sugars CBG this morning 119   LDL 58, continue statin drug  Crestor  Blood pressure 94/76 in the office please make sure you   Getting fluids to prevent dehydration   continue home exercise program after therapy discharge  Follow-up in 3 months

## 2015-06-24 NOTE — Progress Notes (Signed)
I agree with the assessment and plan as directed by NP .The patient is known to me .   Fionna Merriott, MD  

## 2015-06-27 ENCOUNTER — Encounter (HOSPITAL_COMMUNITY): Payer: Self-pay | Admitting: Internal Medicine

## 2015-06-27 ENCOUNTER — Encounter (HOSPITAL_COMMUNITY)
Admission: RE | Disposition: A | Discharge status: Home / Self Care | Payer: Self-pay | Source: Ambulatory Visit | Attending: Internal Medicine

## 2015-06-27 ENCOUNTER — Ambulatory Visit (HOSPITAL_COMMUNITY)
Admission: RE | Admit: 2015-06-27 | Discharge: 2015-06-27 | Disposition: A | Discharge status: Home / Self Care | Payer: Medicare Other | Source: Ambulatory Visit | Attending: Internal Medicine | Admitting: Internal Medicine

## 2015-06-27 DIAGNOSIS — Z7982 Long term (current) use of aspirin: Secondary | ICD-10-CM | POA: Diagnosis not present

## 2015-06-27 DIAGNOSIS — E1140 Type 2 diabetes mellitus with diabetic neuropathy, unspecified: Secondary | ICD-10-CM | POA: Diagnosis not present

## 2015-06-27 DIAGNOSIS — E1143 Type 2 diabetes mellitus with diabetic autonomic (poly)neuropathy: Secondary | ICD-10-CM | POA: Diagnosis not present

## 2015-06-27 DIAGNOSIS — E1121 Type 2 diabetes mellitus with diabetic nephropathy: Secondary | ICD-10-CM | POA: Diagnosis not present

## 2015-06-27 DIAGNOSIS — D509 Iron deficiency anemia, unspecified: Secondary | ICD-10-CM | POA: Diagnosis not present

## 2015-06-27 DIAGNOSIS — Z794 Long term (current) use of insulin: Secondary | ICD-10-CM | POA: Diagnosis not present

## 2015-06-27 DIAGNOSIS — I255 Ischemic cardiomyopathy: Secondary | ICD-10-CM | POA: Diagnosis not present

## 2015-06-27 DIAGNOSIS — I5022 Chronic systolic (congestive) heart failure: Secondary | ICD-10-CM | POA: Diagnosis not present

## 2015-06-27 DIAGNOSIS — Z8614 Personal history of Methicillin resistant Staphylococcus aureus infection: Secondary | ICD-10-CM | POA: Diagnosis not present

## 2015-06-27 DIAGNOSIS — I110 Hypertensive heart disease with heart failure: Secondary | ICD-10-CM | POA: Insufficient documentation

## 2015-06-27 DIAGNOSIS — I2510 Atherosclerotic heart disease of native coronary artery without angina pectoris: Secondary | ICD-10-CM | POA: Insufficient documentation

## 2015-06-27 DIAGNOSIS — M8580 Other specified disorders of bone density and structure, unspecified site: Secondary | ICD-10-CM | POA: Insufficient documentation

## 2015-06-27 DIAGNOSIS — I638 Other cerebral infarction: Secondary | ICD-10-CM | POA: Diagnosis not present

## 2015-06-27 DIAGNOSIS — K3184 Gastroparesis: Secondary | ICD-10-CM | POA: Insufficient documentation

## 2015-06-27 DIAGNOSIS — Z7902 Long term (current) use of antithrombotics/antiplatelets: Secondary | ICD-10-CM | POA: Insufficient documentation

## 2015-06-27 DIAGNOSIS — E559 Vitamin D deficiency, unspecified: Secondary | ICD-10-CM | POA: Insufficient documentation

## 2015-06-27 DIAGNOSIS — E11319 Type 2 diabetes mellitus with unspecified diabetic retinopathy without macular edema: Secondary | ICD-10-CM | POA: Diagnosis not present

## 2015-06-27 DIAGNOSIS — I252 Old myocardial infarction: Secondary | ICD-10-CM | POA: Diagnosis not present

## 2015-06-27 DIAGNOSIS — E785 Hyperlipidemia, unspecified: Secondary | ICD-10-CM | POA: Insufficient documentation

## 2015-06-27 DIAGNOSIS — F329 Major depressive disorder, single episode, unspecified: Secondary | ICD-10-CM | POA: Insufficient documentation

## 2015-06-27 DIAGNOSIS — I639 Cerebral infarction, unspecified: Secondary | ICD-10-CM | POA: Diagnosis not present

## 2015-06-27 HISTORY — PX: EP IMPLANTABLE DEVICE: SHX172B

## 2015-06-27 LAB — GLUCOSE, CAPILLARY: Glucose-Capillary: 111 mg/dL — ABNORMAL HIGH (ref 65–99)

## 2015-06-27 SURGERY — LOOP RECORDER INSERTION
Anesthesia: LOCAL

## 2015-06-27 MED ORDER — LIDOCAINE-EPINEPHRINE 1 %-1:100000 IJ SOLN
INTRAMUSCULAR | Status: DC | PRN
  Administered 2015-06-27: 20 mL

## 2015-06-27 MED ORDER — LIDOCAINE-EPINEPHRINE 1 %-1:100000 IJ SOLN
INTRAMUSCULAR | Status: AC
  Filled 2015-06-27: qty 1

## 2015-06-27 SURGICAL SUPPLY — 2 items
LOOP REVEAL LINQSYS (Prosthesis & Implant Heart) ×1 IMPLANT
PACK LOOP INSERTION (CUSTOM PROCEDURE TRAY) ×2 IMPLANT

## 2015-06-27 NOTE — Discharge Instructions (Signed)
° ° °        ° °  WOUND CARE °- Keep the wound area clean and dry.  Do not get this area wet for 3 days. No showers for 3 days; you may shower on 07/01/15     . °- The tape/steri-strips on your wound will fall off; do not pull them off.  No bandage is needed on the site.  DO  NOT apply any creams, oils, or ointments to the wound area. °- If you notice any drainage or discharge from the wound, any swelling or bruising at the site, or you develop a fever > 101? F after you are discharged home, call the office at once. ° °

## 2015-06-27 NOTE — H&P (View-Only) (Signed)
HPI [name] is referred today for evaluation of cryptogenic stroke. She is a pleasant 55 yo woman with CAD, s/p NSTEMI with fairly severe occlusions, s/p cath with no PCI,  and cryptogenic stroke. She has residual right HP. She has class 2 CHF and moderate LV dysfunction with an EF of 35-40%. She has not had syncope. No edema. She denies anginal symptoms.  Allergies  Allergen Reactions  . Erythromycin Other (See Comments)    GI upset  . Food Nausea Only    Green peas  . Metoclopramide Hcl Other (See Comments)     fatique, depression     Current Outpatient Prescriptions  Medication Sig Dispense Refill  . aspirin EC 81 MG tablet Take 81 mg by mouth at bedtime.     . Calcium Carb-Cholecalciferol (CALCIUM 500 +D PO) Take 500 mg by mouth daily with lunch.    . carvedilol (COREG) 6.25 MG tablet Take 1.5 tablets (9.375 mg total) by mouth 2 (two) times daily. 60 tablet 4  . citalopram (CELEXA) 20 MG tablet Take 1 tablet (20 mg total) by mouth daily. 30 tablet 1  . clopidogrel (PLAVIX) 75 MG tablet Take 1 tablet (75 mg total) by mouth daily. 30 tablet 6  . digoxin (LANOXIN) 0.125 MG tablet Take 0.5 tablets (0.0625 mg total) by mouth every other day. 30 tablet 1  . DULoxetine (CYMBALTA) 30 MG capsule Take 1 capsule (30 mg total) by mouth daily. 30 capsule 3  . ferrous sulfate 325 (65 FE) MG tablet Take 2 tablets (650 mg total) by mouth daily with supper. 30 tablet 3  . furosemide (LASIX) 20 MG tablet TAKE 1 TABLET (20 MG TOTAL) BY MOUTH DAILY. 30 tablet 6  . Insulin Degludec (TRESIBA FLEXTOUCH) 200 UNIT/ML SOPN Inject 22 Units into the skin daily at 12 noon.    . insulin lispro (HUMALOG) 100 UNIT/ML injection Inject SS subQ as directed    . LORazepam (ATIVAN) 0.5 MG tablet Take 1 tablet by mouth 2 (two) times daily as needed.  2  . Multiple Vitamin (MULTIVITAMIN WITH MINERALS) TABS tablet Take 1 tablet by mouth daily with lunch.    . rosuvastatin (CRESTOR) 40 MG tablet Take 1 tablet (40  mg total) by mouth daily with supper. 30 tablet 11  . Vitamin D, Ergocalciferol, (DRISDOL) 50000 UNITS CAPS Take 50,000 Units by mouth every 7 (seven) days. Saturday     No current facility-administered medications for this visit.     Past Medical History  Diagnosis Date  . Hypertension   . Vitamin D deficiency   . Coronary artery disease     a. (12/11) NSTEMI, in the setting of DKA, s/p Cath--LAD totalled in the midportion, diag 70% sten (small 2-mm ), LCx nonobst, OM1 40%, OM2 30%, RCA- no dz. chronic calcified LAD --> no intervention.  b. (06/2010) NSTEMI s/p cath showing same findings   . Ischemic cardiomyopathy   . Orthostatic hypotension     a. sycopal episode in 12/11   . Diabetes mellitus     a. Insulin dependant Type I   . Hyperlipidemia   . Diabetic nephropathy (HCC)   . Diabetic retinopathy   . Gastroparesis   . Depression   . Systolic heart failure     a. (08/2012) EF: 40% Mod hypokin in mid-dist-ant-sept myocard--> psuedonml L vent fill pattern, Grd 2 diast dyscfxn  . Iron deficiency anemia   . Hx MRSA infection   . Osteopenia   . Esophagitis   .  NSTEMI (non-ST elevated myocardial infarction) (HCC)     a. (03/2010) NSTEMI during admission for DKA  b.  . Stroke [facility])     a. (2012) residual R hand/arm weakness  b. while on ASA and plavix  . Diabetic neuropathy (HCC)     ROS:   All systems reviewed and negative except as noted in the HPI.   Past Surgical History  Procedure Laterality Date  . Appendectomy  1980  . Tubal ligation  1987  . Cesarean section  1984  . Refractive surgery      x3     Family History  Problem Relation Age of Onset  . Pancreatic cancer Father   . Colon cancer Neg Hx   . Diabetes Maternal Grandfather      Social History   Social History  . Marital Status: Single    Spouse Name: N/A  . Number of Children: 1  . Years of Education: N/A   Occupational History  . unemployed    Social History Main Topics  . Smoking status:  Never Smoker   . Smokeless tobacco: Never Used  . Alcohol Use: No  . Drug Use: No  . Sexual Activity: Not on file   Other Topics Concern  . Not on file   Social History Narrative   Divorced-1 son     BP 92/50 mmHg  Pulse 79  Ht  (1.626 m)  Wt 155 lb 6.4 oz (70.489 kg)  BMI 26.66 kg/m2  SpO2 98%  LMP 03/13/2011  Physical Exam:  stable appearing 55 yo woman, NAD HEENT: Unremarkable Neck:  6 cm JVD, no thyromegally Back:  No CVA tenderness Lungs:  Clear with no wheezes, rales, or rhonchi HEART:  Regular rate rhythm, no murmurs, no rubs, no clicks Abd:  soft, positive bowel sounds, no organomegally, no rebound, no guarding Ext:  2 plus pulses, no edema, no cyanosis, no clubbing Skin:  No rashes no nodules Neuro:  CN II through XII intact, right HP  EKG - reviewed - nsr  Assess/Plan: 1. Cryptogenic stroke - I have discussed the indications for insertion of an ILR. She is willing to proceed. 2. Chronic systolic heart failure - her symptoms are class 2. Will follow. 3. CAD - she is s/p MI with modest LV dysfunction but no angina.  [name].D.

## 2015-06-27 NOTE — Interval H&P Note (Signed)
History and Physical Interval Note:  06/27/2015 9:21 AM  [name]  has presented today for surgery, with the diagnosis of cryptogenic stroke  The various methods of treatment have been discussed with the patient and family. After consideration of risks, benefits and other options for treatment, the patient has consented to  Procedure(s): Loop Recorder Insertion (N/A) as a surgical intervention .  The patient's history has been reviewed, patient examined, no change in status, stable for surgery.  I have reviewed the patient's chart and labs.  Questions were answered to the patient's satisfaction.     Lewayne BuntingGregg Andelyn Spade

## 2015-06-28 DIAGNOSIS — I638 Other cerebral infarction: Secondary | ICD-10-CM | POA: Diagnosis not present

## 2015-07-06 ENCOUNTER — Ambulatory Visit (INDEPENDENT_AMBULATORY_CARE_PROVIDER_SITE_OTHER): Payer: Medicare Other | Admitting: *Deleted

## 2015-07-06 ENCOUNTER — Encounter: Payer: Self-pay | Admitting: Internal Medicine

## 2015-07-06 DIAGNOSIS — I639 Cerebral infarction, unspecified: Secondary | ICD-10-CM

## 2015-07-06 LAB — CUP PACEART INCLINIC DEVICE CHECK: Date Time Interrogation Session: 20170315112912

## 2015-07-06 NOTE — Progress Notes (Signed)
Wound check in clinic s/p ILR implant. Incision edges approximated, wound well healed without redness or edema. Normal ILR function. Battery status: good. R-waves 0.2612mV. 0 symptom episodes, 0 tachy episodes, 0 pause episodes, 0 brady episodes. 0 AF episodes (0% burden). Monthly summary reports and ROV PRN. Patient education completed regarding wound care, remote monitoring and billing.

## 2015-07-07 ENCOUNTER — Telehealth: Payer: Self-pay | Admitting: Internal Medicine

## 2015-07-07 NOTE — Telephone Encounter (Signed)
Spoke w/ pt mother and she informed me that tech services verified that she did not have adequate amount of cell service and are sending her a landline phone connection.

## 2015-07-07 NOTE — Telephone Encounter (Signed)
Spoke w/ pt mother and attempted to help her send a manual transmission w/ home monitor. After several unsuccessful attempts I instructed pt mother to call tech services. Pt mother verbalized understanding.

## 2015-07-07 NOTE — Telephone Encounter (Signed)
[name] is calling because she reset her monitor and wants to make sure that you received her transmission ..  thanks

## 2015-07-07 NOTE — Telephone Encounter (Signed)
New Prob   Pts Mother is calling regarding some issues she is having with pts device transmission. Please call.

## 2015-07-11 ENCOUNTER — Encounter: Payer: Self-pay | Admitting: Physical Medicine & Rehabilitation

## 2015-07-11 ENCOUNTER — Encounter: Payer: Medicare Other | Attending: Physical Medicine & Rehabilitation

## 2015-07-11 ENCOUNTER — Ambulatory Visit (HOSPITAL_BASED_OUTPATIENT_CLINIC_OR_DEPARTMENT_OTHER): Payer: Medicare Other | Admitting: Physical Medicine & Rehabilitation

## 2015-07-11 VITALS — BP 95/51 | HR 75 | Resp 14

## 2015-07-11 DIAGNOSIS — I5022 Chronic systolic (congestive) heart failure: Secondary | ICD-10-CM | POA: Diagnosis not present

## 2015-07-11 DIAGNOSIS — Z9889 Other specified postprocedural states: Secondary | ICD-10-CM | POA: Insufficient documentation

## 2015-07-11 DIAGNOSIS — Z9851 Tubal ligation status: Secondary | ICD-10-CM | POA: Diagnosis not present

## 2015-07-11 DIAGNOSIS — R269 Unspecified abnormalities of gait and mobility: Secondary | ICD-10-CM

## 2015-07-11 DIAGNOSIS — I639 Cerebral infarction, unspecified: Secondary | ICD-10-CM

## 2015-07-11 DIAGNOSIS — E108 Type 1 diabetes mellitus with unspecified complications: Secondary | ICD-10-CM | POA: Diagnosis not present

## 2015-07-11 DIAGNOSIS — I25119 Atherosclerotic heart disease of native coronary artery with unspecified angina pectoris: Secondary | ICD-10-CM | POA: Insufficient documentation

## 2015-07-11 DIAGNOSIS — I69398 Other sequelae of cerebral infarction: Secondary | ICD-10-CM | POA: Insufficient documentation

## 2015-07-11 DIAGNOSIS — I6350 Cerebral infarction due to unspecified occlusion or stenosis of unspecified cerebral artery: Secondary | ICD-10-CM

## 2015-07-11 DIAGNOSIS — I10 Essential (primary) hypertension: Secondary | ICD-10-CM | POA: Diagnosis not present

## 2015-07-11 NOTE — Progress Notes (Signed)
Subjective:    Patient ID: [name], female    DOB: October 20, 1960, 55 y.o.   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 The patient lives with her parents.  Mother is 323 years old with limited assistance.  The patient used a rolling walker prior to admission. Presented on May 22, 2015, with double vision, decrease in balance, and recent fall while using a walker.  MRI of the brain showed acute small, nonhemorrhagic infarct, left paracentral midbrain and posterior left paracentral pons.  Remote small infarcts scattered throughout the centrum semiovale.  Remote thalamic infarct.  CT angiogram of the head showed no major intracranial arterial occlusion.  Echocardiogram with ejection fraction of 40% with systolic function moderately reduced.  No cardiac source of emboli identified. The patient did not receive tPA. CIR 1/31-2/11/17 HPI Patient is independent with feeding, dressing, bathing. She does some light housekeeping such as some laundry. Does not cook because of right hand coordination problems. She has followed up with neurology. Followed up with cardiology and a loop recorder was placed, suspicion for atrial fibrillation as cause of recurrent strokes. No falls since returning home Home health PT OT and speech therapy have finished. No local outpatient therapy.  Primary care physician Dr. Adrian PrinceStephen South     Review of Systems  Respiratory: Positive for shortness of breath.   All other systems reviewed and are negative.      Objective:   Physical Exam  Constitutional: She is oriented to person, place, and time. She appears well-developed and well-nourished.  HENT:  Head: Normocephalic and atraumatic.  Neurological: She is alert and oriented to person,  place, and time.  Psychiatric: She has a normal mood and affect.  Nursing note and vitals reviewed.  Motor strength is 4 minus in the right deltoid, biceps, triceps, grip there is decreased fine motor coordination in the right upper extremity. Left upper extremities 5/5 in the deltoid biceps triceps grip Right lower extremity is 4 minus at the hip flexor knee extensor 3 minus at the ankle dorsiflexor Left lower extremity is 5/5 in hip flexion extensor 4+ at the left ankle dorsiflexor.  Gait is with a rolling walker no evidence of toe drag or knee instability.       Assessment & Plan:  1. Left pontine and left midbrain infarcts. Patient has had good recovery. 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 Cardiology and neurology have been performing further workup. Loop recorder has been placed. Patient is back to her usual functional level although somewhat more forgetful and the balance is a little worse than it was before. Do not think she needs outpatient therapy at the current time.  Patient will follow-up with her primary physician as well as cardiologist. Physical medicine and rehabilitation follow-up on an as-needed basis   Pain Inventory Average Pain 0 Pain Right Now 0 My pain is no pain  In the last 24 hours, has pain interfered with the following? General activity 0 Relation with others 0 Enjoyment of life 0 What TIME of day is your pain at its worst? no pain Sleep (in general) Good  Pain is worse with: no pain Pain improves with: no pain Relief from Meds: no pain  Mobility walk without assistance walk with assistance use a cane use a walker how many minutes can you walk? ? ability to climb  steps?  no do you drive?  no  Function disabled: date disabled .  Neuro/Psych weakness trouble walking confusion  Prior Studies hospital f/u  Physicians involved in your care hospital f/u   Family History  Problem Relation Age of Onset  . Pancreatic  cancer Father   . Colon cancer Neg Hx   . Diabetes Maternal Grandfather    Social History   Social History  . Marital Status: Single    Spouse Name: N/A  . Number of Children: 1  . Years of Education: N/A   Occupational History  . unemployed    Social History Main Topics  . Smoking status: Never Smoker   . Smokeless tobacco: Never Used  . Alcohol Use: No  . Drug Use: No  . Sexual Activity: Not Asked   Other Topics Concern  . None   Social History Narrative   Divorced-1 son   Living with mother.  Using walker.     Is having PT with [facility].    Past Surgical History  Procedure Laterality Date  . Appendectomy  1980  . Tubal ligation  1987  . Cesarean section  1984  . Refractive surgery      x3  . Tonsillectomy      32yr of age  . Ep implantable device N/A 06/27/2015    Procedure: Loop Recorder Insertion;  Surgeon: Marinus Maw, MD;  Location: [facility] INVASIVE CV LAB;  Service: Cardiovascular;  Laterality: N/A;   Past Medical History  Diagnosis Date  . Hypertension   . Vitamin D deficiency   . Coronary artery disease     a. (12/11) NSTEMI, in the setting of DKA, s/p Cath--LAD totalled in the midportion, diag 70% sten (small 2-mm ), LCx nonobst, OM1 40%, OM2 30%, RCA- no dz. chronic calcified LAD --> no intervention.  b. (06/2010) NSTEMI s/p cath showing same findings   . Ischemic cardiomyopathy   . Orthostatic hypotension     a. sycopal episode in 12/11   . Diabetes mellitus     a. Insulin dependant Type I   . Hyperlipidemia   . Diabetic nephropathy (HCC)   . Diabetic retinopathy   . Gastroparesis   . Depression   . Systolic heart failure     a. (08/2012) EF: 40% Mod hypokin in mid-dist-ant-sept myocard--> psuedonml L vent fill pattern, Grd 2 diast dyscfxn  . Iron deficiency anemia   . Hx MRSA infection   . Osteopenia   . Esophagitis   . NSTEMI (non-ST elevated myocardial infarction) (HCC)     a. (03/2010) NSTEMI during admission for DKA  b.  . Stroke [facility])     a.  (2012) residual R hand/arm weakness  b. while on ASA and plavix  . Diabetic neuropathy (HCC)    BP 95/51 mmHg  Pulse 75  Resp 14  SpO2 97%  LMP 03/13/2011  Opioid Risk Score:   Fall Risk Score:  `1  Depression screen PHQ 2/9  Depression screen PHQ 2/9 07/11/2015  Decreased Interest 0  Down, Depressed, Hopeless 0  PHQ - 2 Score 0  Altered sleeping 0  Tired, decreased energy 0  Change in appetite 0  Feeling bad or failure about yourself  0  Trouble concentrating 0  Moving slowly or fidgety/restless 0  Suicidal thoughts 0  PHQ-9 Score 0

## 2015-07-27 ENCOUNTER — Encounter: Payer: Medicare Other | Admitting: *Deleted

## 2015-07-29 ENCOUNTER — Telehealth: Payer: Self-pay | Admitting: Cardiology

## 2015-07-29 NOTE — Telephone Encounter (Signed)
Attempted to call pt and requested that she send a manual transmission w/ her home monitor b/c it has not updated in at least 14 days. No answer and unable to leave a message.

## 2015-08-03 ENCOUNTER — Telehealth: Payer: Self-pay | Admitting: Cardiology

## 2015-08-03 NOTE — Telephone Encounter (Signed)
LMOVM requesting that pt send manual transmission b/c home monitor has not updated in at least 14 days.    

## 2015-08-11 DIAGNOSIS — R269 Unspecified abnormalities of gait and mobility: Secondary | ICD-10-CM | POA: Diagnosis not present

## 2015-08-11 DIAGNOSIS — M6281 Muscle weakness (generalized): Secondary | ICD-10-CM | POA: Diagnosis not present

## 2015-08-11 DIAGNOSIS — E1040 Type 1 diabetes mellitus with diabetic neuropathy, unspecified: Secondary | ICD-10-CM | POA: Diagnosis not present

## 2015-08-11 DIAGNOSIS — I69398 Other sequelae of cerebral infarction: Secondary | ICD-10-CM | POA: Diagnosis not present

## 2015-08-12 ENCOUNTER — Encounter: Payer: Self-pay | Admitting: Cardiology

## 2015-08-19 ENCOUNTER — Telehealth: Payer: Self-pay | Admitting: Cardiology

## 2015-08-19 NOTE — Telephone Encounter (Signed)
Pt mother called in and stated that pt home monitor doesn't work off cellular towers or with a land line phone. Pt mother agreed to come into the office every 3 months to have loop recorder interrogated. Pt 1st appt will be with MD on 09-27-15 pt mother is going to bring pt home monitor to this appt so we can send it back to [employer].

## 2015-09-06 ENCOUNTER — Ambulatory Visit (HOSPITAL_COMMUNITY)
Admission: RE | Admit: 2015-09-06 | Discharge: 2015-09-06 | Disposition: A | Discharge status: Home / Self Care | Payer: Medicare Other | Source: Ambulatory Visit | Attending: Cardiology | Admitting: Cardiology

## 2015-09-06 ENCOUNTER — Encounter (HOSPITAL_COMMUNITY): Payer: Self-pay

## 2015-09-06 VITALS — BP 110/66 | HR 73 | Wt 152.0 lb

## 2015-09-06 DIAGNOSIS — Z7982 Long term (current) use of aspirin: Secondary | ICD-10-CM | POA: Insufficient documentation

## 2015-09-06 DIAGNOSIS — Z881 Allergy status to other antibiotic agents status: Secondary | ICD-10-CM | POA: Insufficient documentation

## 2015-09-06 DIAGNOSIS — D509 Iron deficiency anemia, unspecified: Secondary | ICD-10-CM | POA: Diagnosis not present

## 2015-09-06 DIAGNOSIS — K3184 Gastroparesis: Secondary | ICD-10-CM | POA: Insufficient documentation

## 2015-09-06 DIAGNOSIS — Z95818 Presence of other cardiac implants and grafts: Secondary | ICD-10-CM | POA: Insufficient documentation

## 2015-09-06 DIAGNOSIS — E559 Vitamin D deficiency, unspecified: Secondary | ICD-10-CM | POA: Insufficient documentation

## 2015-09-06 DIAGNOSIS — I5022 Chronic systolic (congestive) heart failure: Secondary | ICD-10-CM | POA: Insufficient documentation

## 2015-09-06 DIAGNOSIS — E1021 Type 1 diabetes mellitus with diabetic nephropathy: Secondary | ICD-10-CM | POA: Diagnosis not present

## 2015-09-06 DIAGNOSIS — E1022 Type 1 diabetes mellitus with diabetic chronic kidney disease: Secondary | ICD-10-CM | POA: Diagnosis not present

## 2015-09-06 DIAGNOSIS — I255 Ischemic cardiomyopathy: Secondary | ICD-10-CM | POA: Insufficient documentation

## 2015-09-06 DIAGNOSIS — I2510 Atherosclerotic heart disease of native coronary artery without angina pectoris: Secondary | ICD-10-CM | POA: Insufficient documentation

## 2015-09-06 DIAGNOSIS — I252 Old myocardial infarction: Secondary | ICD-10-CM | POA: Insufficient documentation

## 2015-09-06 DIAGNOSIS — I130 Hypertensive heart and chronic kidney disease with heart failure and stage 1 through stage 4 chronic kidney disease, or unspecified chronic kidney disease: Secondary | ICD-10-CM | POA: Diagnosis not present

## 2015-09-06 DIAGNOSIS — E10319 Type 1 diabetes mellitus with unspecified diabetic retinopathy without macular edema: Secondary | ICD-10-CM | POA: Diagnosis not present

## 2015-09-06 DIAGNOSIS — E1043 Type 1 diabetes mellitus with diabetic autonomic (poly)neuropathy: Secondary | ICD-10-CM | POA: Diagnosis not present

## 2015-09-06 DIAGNOSIS — N189 Chronic kidney disease, unspecified: Secondary | ICD-10-CM | POA: Diagnosis not present

## 2015-09-06 DIAGNOSIS — E785 Hyperlipidemia, unspecified: Secondary | ICD-10-CM | POA: Diagnosis not present

## 2015-09-06 DIAGNOSIS — F329 Major depressive disorder, single episode, unspecified: Secondary | ICD-10-CM | POA: Diagnosis not present

## 2015-09-06 DIAGNOSIS — Z7902 Long term (current) use of antithrombotics/antiplatelets: Secondary | ICD-10-CM | POA: Insufficient documentation

## 2015-09-06 DIAGNOSIS — Z888 Allergy status to other drugs, medicaments and biological substances status: Secondary | ICD-10-CM | POA: Diagnosis not present

## 2015-09-06 DIAGNOSIS — Z8673 Personal history of transient ischemic attack (TIA), and cerebral infarction without residual deficits: Secondary | ICD-10-CM | POA: Insufficient documentation

## 2015-09-06 DIAGNOSIS — Z79899 Other long term (current) drug therapy: Secondary | ICD-10-CM | POA: Insufficient documentation

## 2015-09-06 DIAGNOSIS — Z794 Long term (current) use of insulin: Secondary | ICD-10-CM | POA: Insufficient documentation

## 2015-09-06 LAB — BASIC METABOLIC PANEL
Anion gap: 9 (ref 5–15)
BUN: 21 mg/dL — AB (ref 6–20)
CALCIUM: 9.4 mg/dL (ref 8.9–10.3)
CO2: 22 mmol/L (ref 22–32)
CREATININE: 1.4 mg/dL — AB (ref 0.44–1.00)
Chloride: 109 mmol/L (ref 101–111)
GFR calc non Af Amer: 41 mL/min — ABNORMAL LOW (ref >60)
GFR, EST AFRICAN AMERICAN: 47 mL/min — AB (ref >60)
GLUCOSE: 128 mg/dL — AB (ref 65–99)
Potassium: 3.7 mmol/L (ref 3.5–5.1)
Sodium: 140 mmol/L (ref 135–145)

## 2015-09-06 LAB — LIPID PANEL
Cholesterol: 117 mg/dL (ref 0–200)
HDL: 49 mg/dL (ref >40)
LDL CALC: 57 mg/dL (ref 0–99)
Total CHOL/HDL Ratio: 2.4 RATIO
Triglycerides: 55 mg/dL (ref <150)
VLDL: 11 mg/dL (ref 0–40)

## 2015-09-06 LAB — DIGOXIN LEVEL: Digoxin Level: <0.2 ng/mL — ABNORMAL LOW (ref 0.8–2.0)

## 2015-09-06 NOTE — Patient Instructions (Signed)
Routine lab work today. Will notify you of abnormal results, otherwise no news is good news!  Follow up 4 months with Dr. McLean.  Do the following things EVERYDAY: 1) Weigh yourself in the morning before breakfast. Write it down and keep it in a log. 2) Take your medicines as prescribed 3) Eat low salt foods-Limit salt (sodium) to 2000 mg per day.  4) Stay as active as you can everyday 5) Limit all fluids for the day to less than 2 liters 

## 2015-09-07 NOTE — Progress Notes (Signed)
Patient ID: [name], female   DOB: 11-08-1960, 55 y.o.   MRN: [id number] PCP: Dr. Evlyn Kanner Cardiology: Dr. Shirlee Latch  55 yo with history of Type I diabetes, CVA, CAD, and ischemic cardiomyopathy presents for cardiology followup.  She had a prolonged admission with DKA and systolic CHF in 12/11.  Cardiac enzymes were found to be elevated and left heart cath showed occluded LAD, which appeared chronic at the time of cath.  Echo showed peri-apical akinesis with EF 30%.  Management was complicated by orthostatic hypotension causing syncope.  This was likely due to combination of low cardiac output and diabetic autonomic neuropathy.  This limited titration of cardiac medications initially. Repeat echo (1/12) showed EF 40-45% (improved).  She was readmitted with DKA in 3/12, and cardiac enzymes were noted to be quite elevated with troponin peaking at 24 and CKMB at 69.  Repeat LHC was done, actually showing no significant difference from the 12/11 study.    In 5/12, she noted weakness one day in her right leg and arm.  She had a head MRI done, showing a small acute left parietal infarction.  This stroke occurred while on ASA and Plavix.  She has improved but her left arm still has some weakness. Carotid dopplers showed no significant carotid stenosis.  I did a limited echo to look for LV thrombus, and this was not seen.  3 week event monitor showed no atrial fibrillation.    Echo in 5/14 showed EF 40%.  We had to cut back on her cardiac meds due to hyperkalemia and hypotension.  She is now off ACEI completely (was hypotensive with even a low dose).  She was admitted again in 12/14 with DKA and elevated troponin (peak 2).  No chest pain.  Echo in 12/14 showed EF down to 25-30% but wall motion abnormality pattern was the same.  Given elevated creatinine, no chest pain, and very elevated troponin at prior admit with DKA without new coronary disease, I did not cath her.  Repeat echo in 3/15 showed EF 40-45% with  anteroseptal and apical severe hypokinesis to akinesis.    In 1/17, she was admitted with recurrent CVA, this time involving the left midbrain and pons.  She noted diplopia. Workup in the hospital showed unremarkable carotid dopplers and stable echo with EF 35-40%.  No arrhythmias were noted on monitoring. She went to CIR.   Lives with her mother.  Walks with walker for balance.  She gets short of breath walking for long distances. No chest pain.  No orthopnea/PND.  No tachypalpitations.  No falls or lightheadedness.  She now has a LINQ monitor but is unable to send in remote transmissions.  Labs (12/11): K 3.7, creatinine 0.88, BNP 612, LDL 29, HDL 44 Labs (1/12): digoxin 1.0, BNP 199, K 4.4, creatinine 0.9 Labs (5/12): digoxin 0.4 Labs (6/12): K 4.2, creatinine 0.99 Labs (7/12): K 6.2, creatinine 1.3 (KCl and spironolactone held).  Labs (7/12), repeat: K 4.8, creatinine 1.2, BNP 173 Labs (11/12): K 4.6, creatinine 1.6 => 1.3, TnI 0.7 Labs (12/12): K 4.9, creatinine 1.5, LDL 76, HDL 55, BNP 103, digoxin 1.0 Labs (3/13): K 4.2, creatinine 1.1, digoxin 1.0 Labs (6/13): LDL 57, HDL 51, digoxin 0.3 Labs (82/95): K 4.4, creatinine 1.4 Labs (1/14): BNP 253 => 108, K 4.4, creatinine 1.3 Labs (4/14): K 3.7, creatinine 1.2, LDL 61, HDL 57 Labs (12/14): K 4.2, creatinine 1.79, digoxin 0.7 Labs (2/15): K 4.8, creatinine 1.07, digoxin 0.4, HCT 41.5, LFTs normal Labs (  3/15): LDL 64, HDL 52 Labs (7/15): K 4.7, creatinine 1.25, digoxin 0.4 Labs (11/15): digoxin 0.5 Labs (5/16): K 4, creatinine 1.92, HCT 43 Labs (7/16): LDL 53, HDL 43 Labs (1/17): K 4.2, creatinine 1.12 Labs (2/17): K 4.1, creatinine 1.6, digoxin 0.5  ECG (1/17) with NSR, nonspecific T wave flattening, PVC  Allergies (verified):  1)  Reglan 2)  Erythromycin  Past Medical History: 1. Hypertension: However, after MI had orthostatic hypotension.  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 CAD: NSTEMI 12/11.  Cardiac  catheterization (12/11) showing LAD totalled in the midportion, diagonal 1 70% stenosed (small 2-mm vessel), circumflex nonobstructive, OM1 40%, OM2 30%, RCA small and no significant disease.  LAD occlusion was calcified and appeared chronic at the time of catheterization so no intervention.  Patient readmitted with DKA in 3/12, troponin found to be 24 and CKMB 69.  Repeat LHC showed no change from 12/11 study.  5. Ischemic cardiomyopathy: Echo (12/11) with EF 30% and periapical akinesis, no significant MR, RV looked normal.  QRS is not wide on ECG.  Echo (1/12): EF 40-45%, severe hypokinesis of the anteroseptal wall, apical inferior wall, inferoseptal wall, and apex.  Hyperkalemia with spironolactone and with > 2.5 mg daily enalapril.  Echo 7/12 with EF 40-45%.  Echo (5/14): EF 40%, mid-apical anteroseptal hypokinesis, grade II diastolic dysfunction. Echo (12/14) with EF 25-30%, periapical akinesis. Echo (3/15) with EF 40-45%, anteroseptal and apical severe hypokinesis to akinesis. Echo (1/17) with EF 35-40%, akinesis anteroseptal wall and apex.  6. Orthostatic hypotension: Syncopal episode in 12/11 likely due to this.  Probably from combination of depressed cardiac output and diabetic autonomic neuropathy.  7. Type 1 insulin-dependent diabetes. History of DKA.  8. Hyperlipidemia.  9. Diabetic retinopathy.  10. Gastroparesis.  11. Diabetic nephropathy  12. Depression.  13. 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  Carotid dopplers (7/12) with no significant disease.  Limited echo in 7/12 did not show an LV apical thrombus.  3-week event monitor in 7/12 did not show any runs of atrial fibrillation.  CVA (1/17): Left midbrain and pons.  Carotid US (1/17) without significant stenosis.  She now has a loop recorder to look for atrial fibrillation.   Family History: No FH of Colon Cancer: Family History of Pancreatic Cancer: Father deceased [id number] No premature CAD  Social History: Divorced, 1  boy unemployed, [place] Patient has never smoked.  Alcohol Use - no Daily Caffeine Use 1 cup/day Illicit Drug Use - no  ROS: All systems reviewed and negative except as per HPI.    Current Outpatient Prescriptions  Medication Sig Dispense Refill  . aspirin EC 81 MG tablet Take 81 mg by mouth at bedtime.     . Calcium Carb-Cholecalciferol (CALCIUM 500 +D PO) Take 500 mg by mouth daily with lunch.    . carvedilol (COREG) 6.25 MG tablet Take 1.5 tablets (9.375 mg total) by mouth 2 (two) times daily. 270 tablet 2  . citalopram (CELEXA) 20 MG tablet Take 1 tablet (20 mg total) by mouth daily. 30 tablet 1  . clopidogrel (PLAVIX) 75 MG tablet Take 1 tablet (75 mg total) by mouth daily. 30 tablet 6  . digoxin (LANOXIN) 0.125 MG tablet Take 0.5 tablets (0.0625 mg total) by mouth every other day. 30 tablet 1  . DULoxetine (CYMBALTA) 30 MG capsule Take 1 capsule (30 mg total) by mouth daily. 30 capsule 3  . ferrous sulfate 325 (65 FE) MG tablet Take 2 tablets (650  mg total) by mouth daily with supper. 30 tablet 3  . furosemide (LASIX) 20 MG tablet TAKE 1 TABLET (20 MG TOTAL) BY MOUTH DAILY. 30 tablet 6  . Insulin Degludec (TRESIBA FLEXTOUCH) 200 UNIT/ML SOPN Inject 22 Units into the skin daily at 12 noon.    . insulin lispro (HUMALOG) 100 UNIT/ML injection Inject 2-5 Units into the skin 3 (three) times daily with meals. Inject SS subQ as directed    . LORazepam (ATIVAN) 0.5 MG tablet Take 1 tablet by mouth at bedtime.   2  . Multiple Vitamin (MULTIVITAMIN WITH MINERALS) TABS tablet Take 1 tablet by mouth daily with lunch.    Letta Pate VERIO test strip     . rosuvastatin (CRESTOR) 40 MG tablet Take 1 tablet (40 mg total) by mouth daily with supper. 30 tablet 11  . Vitamin D, Ergocalciferol, (DRISDOL) 50000 UNITS CAPS Take 50,000 Units by mouth every 7 (seven) days. Saturday     No current facility-administered medications for this encounter.    BP 110/66 mmHg  Pulse 73  Wt 152 lb  (68.947 kg)  SpO2 98%  LMP 03/13/2011 General:  Well developed, well nourished, in no acute distress. Neck:  Neck thick, JVP difficult, suspect not elevated. No masses, thyromegaly or abnormal cervical nodes. Lungs:  Crackles right base Heart:  Non-displaced PMI, chest non-tender; regular rate and rhythm, S1, S2 without murmurs, rubs or gallops. Carotid upstroke normal, no bruit. Pedals normal pulses. No edema.   Abdomen:  Bowel sounds positive; abdomen soft and non-tender without masses, organomegaly, or hernias noted. No hepatosplenomegaly. Extremities:  No clubbing or cyanosis. Neurologic:  Alert and oriented x 3. Right arm is weak.  Psych:  Normal affect.  Assessment/Plan:  CHRONIC SYSTOLIC HEART FAILURE  Ischemic cardiomyopathy. EF 25-30% on 12/14 echo.  This was lower than the prior, but was in the setting of acute DKA which may have affected her LV function. Followup echo in 3/15 showed EF 40-45% with anteroseptal and apical wall motion abnormalities.  This is more in line with prior echoes. Most recent echo in 1/17 showed EF 35-40%.  She remains out of range for ICD placement.  She is off spironolactone as she developed hyperkalemia while taking this medication and enalapril was stopped due to symptomatic low BP even at low dose. Though she has dyspnea walking longer distances, she does not look volume overloaded on exam.  - I will continue her on the current doses of digoxin, Coreg, and Lasix.  She has not tolerated uptitration of meds well in the past.  Check BMET, BNP and digoxin level. CAD Known coronary disease with occluded mid-LAD.  She had troponin peaking at 2 during 12/14 admission with DKA.  She did not have chest pain and wall motion abnormality pattern on echo was similar to the past.  Creatinine was elevated. She had a similar DKA event in 3/12 with very elevated TnI and catheterization showing no changes in her coronary disease.   I elected not to re-cath her in 12/14.  She  denies any chest pain or dyspnea currently. Continue ASA 81, Plavix, and Crestor.   CVA  She has now had 2 CVAs. Carotid dopplers showed no significant disease and echo did not show LV apical thrombus. She has been on ASA 81 and Plavix.  At this point, I think that she needs prolonged monitoring for atrial fibrillation => she now has a loop recorder that is monitored by EP. HYPERLIPIDEMIA  Continue Crestor.  Check lipids  today.    CKD Diabetic nephropathy, BMET today.   I encouraged her to ride her stationary bike for exercise.   Followup in 4 months     Marca Ancona 09/07/2015

## 2015-09-23 ENCOUNTER — Ambulatory Visit: Payer: Medicare Other | Admitting: Nurse Practitioner

## 2015-09-27 ENCOUNTER — Ambulatory Visit (INDEPENDENT_AMBULATORY_CARE_PROVIDER_SITE_OTHER): Payer: Medicare Other | Admitting: Internal Medicine

## 2015-09-27 ENCOUNTER — Encounter: Payer: Self-pay | Admitting: Internal Medicine

## 2015-09-27 VITALS — BP 94/52 | HR 74 | Ht 64.0 in | Wt 151.4 lb

## 2015-09-27 DIAGNOSIS — I639 Cerebral infarction, unspecified: Secondary | ICD-10-CM

## 2015-09-27 LAB — CUP PACEART INCLINIC DEVICE CHECK: Date Time Interrogation Session: 20170606140822

## 2015-09-27 NOTE — Progress Notes (Signed)
HPI [name] is referred today for evaluation of cryptogenic stroke. She is a pleasant 55 yo woman with CAD, brittle DM, s/p NSTEMI with fairly severe occlusions, s/p cath with no PCI,  and cryptogenic stroke. She has residual right HP. She has class 2 CHF and moderate LV dysfunction with an EF of 35-40%. She has not had syncope. No edema. She denies anginal symptoms. She notes that her blood sugar has been up an down. Allergies  Allergen Reactions  . Erythromycin Other (See Comments)    GI upset  . Food Nausea Only    Green peas  . Metoclopramide Hcl Other (See Comments)     fatique, depression  . Reglan [Metoclopramide] Other (See Comments)    Pt states she feels depressed when taking the medication      Current Outpatient Prescriptions  Medication Sig Dispense Refill  . Calcium Carb-Cholecalciferol (CALCIUM 500 +D PO) Take 500 mg by mouth daily with lunch.    . carvedilol (COREG) 6.25 MG tablet Take 1.5 tablets (9.375 mg total) by mouth 2 (two) times daily. 270 tablet 2  . citalopram (CELEXA) 20 MG tablet Take 1 tablet (20 mg total) by mouth daily. 30 tablet 1  . clopidogrel (PLAVIX) 75 MG tablet Take 1 tablet (75 mg total) by mouth daily. 30 tablet 6  . digoxin (LANOXIN) 0.125 MG tablet Take 0.5 tablets (0.0625 mg total) by mouth every other day. 30 tablet 1  . DULoxetine (CYMBALTA) 30 MG capsule Take 1 capsule (30 mg total) by mouth daily. 30 capsule 3  . ferrous sulfate 325 (65 FE) MG tablet Take 2 tablets (650 mg total) by mouth daily with supper. 30 tablet 3  . furosemide (LASIX) 20 MG tablet TAKE 1 TABLET (20 MG TOTAL) BY MOUTH DAILY. 30 tablet 6  . Insulin Degludec (TRESIBA FLEXTOUCH) 200 UNIT/ML SOPN Inject 22 Units into the skin daily at 12 noon.    . insulin lispro (HUMALOG) 100 UNIT/ML injection Inject 2-5 Units into the skin 3 (three) times daily with meals. Inject SS subQ as directed    . LORazepam (ATIVAN) 0.5 MG tablet Take 1 tablet by mouth at bedtime.   2  .  Multiple Vitamin (MULTIVITAMIN WITH MINERALS) TABS tablet Take 1 tablet by mouth daily with lunch.    [name] ONETOUCH VERIO test strip     . rosuvastatin (CRESTOR) 40 MG tablet Take 1 tablet (40 mg total) by mouth daily with supper. 30 tablet 11  . Vitamin D, Ergocalciferol, (DRISDOL) 50000 UNITS CAPS Take 50,000 Units by mouth every 7 (seven) days. Saturday    . aspirin EC 81 MG tablet Take 81 mg by mouth at bedtime.     [name]. GLUCAGON EMERGENCY 1 MG injection Take 1 tablet by mouth as directed.  3   No current facility-administered medications for this visit.     Past Medical History  Diagnosis Date  . Hypertension   . Vitamin D deficiency   . Coronary artery disease     a. (12/11) NSTEMI, in the setting of DKA, s/p Cath--LAD totalled in the midportion, diag 70% sten (small 2-mm ), LCx nonobst, OM1 40%, OM2 30%, RCA- no dz. chronic calcified LAD --> no intervention.  b. (06/2010) NSTEMI s/p cath showing same findings   . Ischemic cardiomyopathy   . Orthostatic hypotension     a. sycopal episode in 12/11   . Diabetes mellitus     a. Insulin dependant Type I   . Hyperlipidemia   .  Diabetic nephropathy (HCC)   . Diabetic retinopathy   . Gastroparesis   . Depression   . Systolic heart failure     a. (08/2012) EF: 40% Mod hypokin in mid-dist-ant-sept myocard--> psuedonml L vent fill pattern, Grd 2 diast dyscfxn  . Iron deficiency anemia   . Hx MRSA infection   . Osteopenia   . Esophagitis   . NSTEMI (non-ST elevated myocardial infarction) (HCC)     a. (03/2010) NSTEMI during admission for DKA  b.  . Stroke [facility])     a. (2012) residual R hand/arm weakness  b. while on ASA and plavix  . Diabetic neuropathy (HCC)     ROS:   All systems reviewed and negative except as noted in the HPI.   Past Surgical History  Procedure Laterality Date  . Appendectomy  1980  . Tubal ligation  1987  . Cesarean section  1984  . Refractive surgery      x3  . Tonsillectomy      52yr of age  . Ep  implantable device N/A 06/27/2015    Procedure: Loop Recorder Insertion;  Surgeon: Marinus Maw, MD;  Location: [facility] INVASIVE CV LAB;  Service: Cardiovascular;  Laterality: N/A;     Family History  Problem Relation Age of Onset  . Pancreatic cancer Father   . Colon cancer Neg Hx   . Diabetes Maternal Grandfather      Social History   Social History  . Marital Status: Single    Spouse Name: N/A  . Number of Children: 1  . Years of Education: N/A   Occupational History  . unemployed    Social History Main Topics  . Smoking status: Never Smoker   . Smokeless tobacco: Never Used  . Alcohol Use: No  . Drug Use: No  . Sexual Activity: Not on file   Other Topics Concern  . Not on file   Social History Narrative   Divorced-1 son   Living with mother.  Using walker.     Is having PT with [facility].      BP 94/52 mmHg  Pulse 74  Ht  (1.626 m)  Wt 151 lb 6.4 oz (68.675 kg)  BMI 25.98 kg/m2  SpO2 98%  LMP 03/13/2011  Physical Exam:  stable appearing 55 yo woman, NAD HEENT: Unremarkable Neck:  6 cm JVD, no thyromegally Back:  No CVA tenderness Lungs:  Clear with no wheezes, rales, or rhonchi HEART:  Regular rate rhythm, no murmurs, no rubs, no clicks Abd:  soft, positive bowel sounds, no organomegally, no rebound, no guarding Ext:  2 plus pulses, no edema, no cyanosis, no clubbing Skin:  No rashes no nodules Neuro:  CN II through XII intact, right HP   Assess/Plan: 1. Cryptogenic stroke - She is stable but has had no recurrent symptoms since her ILR. No arrhythmia 2. Chronic systolic heart failure - her symptoms are class 2. Will follow. 3. CAD - she is s/p MI with modest LV dysfunction but no angina.  [name].D.

## 2015-09-27 NOTE — Patient Instructions (Signed)
Medication Instructions:  Your physician recommends that you continue on your current medications as directed. Please refer to the Current Medication list given to you today.   Labwork: None ordered   Testing/Procedures: None ordered   Follow-Up:  Your physician recommends that you schedule a follow-up appointment in: 3 months in the device clinic(morning appointment)  Your physician wants you to follow-up in: 12 months with Dr Court Joyaylor You will receive a reminder letter in the mail two months in advance. If you don't receive a letter, please call our office to schedule the follow-up appointment.    Any Other Special Instructions Will Be Listed Below (If Applicable).     If you need a refill on your cardiac medications before your next appointment, please call your pharmacy.

## 2015-09-29 ENCOUNTER — Ambulatory Visit (INDEPENDENT_AMBULATORY_CARE_PROVIDER_SITE_OTHER): Payer: Medicare Other | Admitting: Ophthalmology

## 2015-10-10 ENCOUNTER — Ambulatory Visit (INDEPENDENT_AMBULATORY_CARE_PROVIDER_SITE_OTHER): Payer: Medicare Other | Admitting: Ophthalmology

## 2015-10-10 DIAGNOSIS — E103593 Type 1 diabetes mellitus with proliferative diabetic retinopathy without macular edema, bilateral: Secondary | ICD-10-CM

## 2015-10-10 DIAGNOSIS — I10 Essential (primary) hypertension: Secondary | ICD-10-CM | POA: Diagnosis not present

## 2015-10-10 DIAGNOSIS — H43813 Vitreous degeneration, bilateral: Secondary | ICD-10-CM

## 2015-10-10 DIAGNOSIS — E10319 Type 1 diabetes mellitus with unspecified diabetic retinopathy without macular edema: Secondary | ICD-10-CM | POA: Diagnosis not present

## 2015-10-10 DIAGNOSIS — H35033 Hypertensive retinopathy, bilateral: Secondary | ICD-10-CM

## 2015-12-22 ENCOUNTER — Ambulatory Visit (INDEPENDENT_AMBULATORY_CARE_PROVIDER_SITE_OTHER): Payer: Medicare Other | Admitting: Physician Assistant

## 2015-12-22 ENCOUNTER — Encounter: Payer: Self-pay | Admitting: Physician Assistant

## 2015-12-22 ENCOUNTER — Encounter (INDEPENDENT_AMBULATORY_CARE_PROVIDER_SITE_OTHER): Payer: Self-pay

## 2015-12-22 VITALS — BP 126/78 | HR 68 | Ht 64.5 in | Wt 149.1 lb

## 2015-12-22 DIAGNOSIS — R159 Full incontinence of feces: Secondary | ICD-10-CM

## 2015-12-22 NOTE — Progress Notes (Signed)
Subjective:    Patient ID: [name], female    DOB: 1960-11-17, 55 y.o.   MRN: [id number]  HPI [name] is a pleasant 55 year old white female referred today by Dr. Evlyn Kanner known  previously to Dr. Juanda Chance. She is referred today for evaluation of nocturnal incontinent diarrhea. Patient was last seen in our office in January 2016 at that time after a positive stool for fecal occult blood. Due to her multiple other comorbidities decision was made to repeat Hemoccult cards which were negative and check iron studies which were also within normal limits. Last EGD and colonoscopy were done in May 2010. She had mild esophagitis on EGD, duodenal biopsies were done and were negative. Colonoscopy was a normal exam.  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 She also has history of coronary artery disease is status post MI, and has had 2 CVA's ,the first  in 2012 and the second in January 2017 which occurred while on aspirin and Plavix. She is also followed for ischemic cardiomyopathy with EF of about 35%. She is maintained on aspirin and Plavix. She and her mother state that she does not have any difficulty with her bowels during the day. She has no complaints of abdominal pain and has not had any changes in her bowel habits. She says she normally has one formed stool per day, sometimes skips a day. She has had issues over the past several years with very sporadic episodes of nocturnal incontinence which is usually of very loose stool. Her mother says this was occurring once every few months, she has had an increase in frequency of these episodes. About a month ago she had 2 episodes in 1 day then hasn't had any since. Patient is completely unaware when these occur, and they always occur while she is asleep. She has not had any issues with daytime incontinence and does not have any awareness of urgency for bowel movements etc.  Review of Systems Pertinent positive and negative review of  systems were noted in the above HPI section.  All other review of systems was otherwise negative.  Outpatient Encounter Prescriptions as of 12/22/2015  Medication Sig  . aspirin EC 81 MG tablet Take 81 mg by mouth at bedtime.   . Calcium Carb-Cholecalciferol (CALCIUM 500 +D PO) Take 500 mg by mouth daily with lunch.  . carvedilol (COREG) 6.25 MG tablet Take 1.5 tablets (9.375 mg total) by mouth 2 (two) times daily.  . citalopram (CELEXA) 20 MG tablet Take 1 tablet (20 mg total) by mouth daily.  . clopidogrel (PLAVIX) 75 MG tablet Take 1 tablet (75 mg total) by mouth daily.  . digoxin (LANOXIN) 0.125 MG tablet Take 0.5 tablets (0.0625 mg total) by mouth every other day.  . DULoxetine (CYMBALTA) 30 MG capsule Take 1 capsule (30 mg total) by mouth daily. (Patient taking differently: Take 60 mg by mouth daily. )  . furosemide (LASIX) 20 MG tablet TAKE 1 TABLET (20 MG TOTAL) BY MOUTH DAILY.  [name] GLUCAGON EMERGENCY 1 MG injection Take 1 tablet by mouth as directed.  . Insulin Degludec (TRESIBA FLEXTOUCH) 200 UNIT/ML SOPN Inject 22 Units into the skin daily at 12 noon.  . insulin lispro (HUMALOG) 100 UNIT/ML injection Inject 2-5 Units into the skin as needed. Inject SS subQ as directed   . LORazepam (ATIVAN) 0.5 MG tablet Take 1 tablet by mouth at bedtime.   . Multiple Vitamin (MULTIVITAMIN WITH MINERALS) TABS tablet Take 1 tablet by mouth daily with lunch.  [name]  ONETOUCH VERIO test strip   . rosuvastatin (CRESTOR) 40 MG tablet Take 1 tablet (40 mg total) by mouth daily with supper.  . Vitamin D, Ergocalciferol, (DRISDOL) 50000 UNITS CAPS Take 50,000 Units by mouth every 7 (seven) days. Saturday  . ferrous sulfate 325 (65 FE) MG tablet Take 2 tablets (650 mg total) by mouth daily with supper. (Patient taking differently: Take 130 mg by mouth daily with supper. )   No facility-administered encounter medications on file as of 12/22/2015.    Allergies  Allergen Reactions  . Erythromycin Other (See Comments)     GI upset  . Food Nausea Only    Green peas  . Metoclopramide Hcl Other (See Comments)     fatique, depression  . Reglan [Metoclopramide] Other (See Comments)    Pt states she feels depressed when taking the medication    Patient Active Problem List   Diagnosis Date Noted  . Gait disturbance, post-stroke 07/11/2015  . Diabetes mellitus type 2 in nonobese (HCC)   . 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 Coronary artery disease involving native coronary artery of native heart without angina pectoris   . Chronic systolic congestive heart failure (HCC)   . Anemia, iron deficiency   . HLD (hyperlipidemia)   . Cerebrovascular accident (CVA) due to thrombosis of basilar artery (HCC)   . Diplopia   . 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 DM type 2 with diabetic peripheral neuropathy (HCC)   . History of CVA with residual deficit   . Benign essential HTN   . Hemiparesis affecting dominant side as late effect of stroke (HCC)   . DM type 2, goal A1c below 7 05/22/2015  . Heme + stool 05/04/2014  . DKA (diabetic ketoacidoses) (HCC) 03/23/2013  . Elevated troponin 03/23/2013  . Type I (juvenile type) diabetes mellitus with ophthalmic manifestations, uncontrolled 02/17/2013  . Depression 02/17/2013  . Altered mental status 02/14/2013    Class: Acute  . Diabetic ketoacidosis (HCC) 02/14/2013    Class: Acute  . Hypercalcemia 02/14/2013    Class: Acute  . Dehydration 02/14/2013    Class: Acute  . Acute renal insufficiency 02/14/2013    Class: Acute  . Fatigue 05/04/2012  . OSA (obstructive sleep apnea) 07/03/2011  . UTI (lower urinary tract infection) 03/21/2011  . Preseptal cellulitis 11/21/2010  . CVA (cerebral infarction) 10/17/2010  . FATIGUE 07/10/2010  . PERIMENOPAUSAL STATUS 07/10/2010  . DIABETES MELLITUS, TYPE I 04/25/2010  . DIABETIC  RETINOPATHY 04/25/2010  . Coronary atherosclerosis of native coronary artery 04/25/2010  .  ISCHEMIC CARDIOMYOPATHY 04/25/2010  . Chronic systolic heart failure (HCC) 04/25/2010  . ORTHOSTATIC HYPOTENSION 04/25/2010  . Hyperlipidemia 08/30/2008  . ANEMIA-IRON DEFICIENCY 08/30/2008  . HYPERTENSION 08/30/2008   Social History   Social History  . Marital status: Single    Spouse name: N/A  . Number of children: 1  . Years of education: N/A   Occupational History  . unemployed Unemployed   Social History Main Topics  . Smoking status: Never Smoker  . Smokeless tobacco: Never Used  . Alcohol use No  . Drug use: No  . Sexual activity: Not on file   Other Topics Concern  . Not on file   Social History Narrative   Divorced-1 son   Living with mother.  Using walker.         [name]'s family history includes Diabetes in her maternal grandfather; Pancreatic cancer in her father.  Objective:    Vitals:   12/22/15 1030  BP: 126/78  Pulse: 68    Physical Exam  well-developed chronically ill-appearing white female, appears older than stated age, accompanied by her mother. Patient ambulates with a walker. Blood pressure 126/78 pulse 68 height 5 foot 4 weight 149 BMI of 25.2. HEENT; nontraumatic normocephalic EOMI PERRLA sclera anicteric, Cardiovascular; regular rate and rhythm with S1-S2 soft systolic murmur, Pulmonary ;clear bilaterally, Abdomen; soft nontender nondistended bowel sounds are active there is no palpable mass or hepatosplenomegaly rectal exam not done, Ext; no clubbing cyanosis or edema skin warm and dry, Neuropsych; mood and affect appropriate       Assessment & Plan:   #21 55 year old female with multiple serious comorbidities and brittle insulin-dependent diabetes, presenting with intermittent episodes of nocturnal fecal incontinence of loose stool. These episodes are very poradic, generally occurring once  every few weeks. Patient has no daytime issues with incontinence or diarrhea. Suspect this may be related to diabetic visceral neuropathy,  doubt any significant pelvic  floor dysfunction or sphincter dysfunction as she does not have any daytime issues #2 ischemic cardiomyopathy with EF 35% #3 coronary artery disease status post MI #4 recurrent CVAs-last event January 2017 occurred while on aspirin and Plavix  Plan; Start one half to one tablet of Imodium daily at bedtime. Patient's mother will titrate according to the effectiveness and any side effect of constipation Start daily probiotic-suggested Culturelle  or Florastor Patient will be established with Dr. Myrtie Neither. Patient can follow up with Dr. Myrtie Neither or myself on an as-needed basis if the above measures are ineffective.    Catherine Oswald Hillock PA-C 12/22/2015   Cc: Adrian Prince, MD

## 2015-12-22 NOTE — Patient Instructions (Signed)
Start a daily probiotic, Align, Culturelle, Landlorastor. We have provided you with coupons and samples of Align.  Start Imodium, 1/2 tablet at bedtime.  Follow  Up with Dr. Amada JupiterHenry Danis or [facility]-C as needed.

## 2015-12-22 NOTE — Progress Notes (Signed)
Thank you for sending this case to me. I have reviewed the entire note, and the outlined plan seems appropriate.  I agree with this plan.  Does seem like diabetic neuropathy affecting ano-rectal sensation and continence. Conservative approach due to her her condition.

## 2015-12-23 ENCOUNTER — Other Ambulatory Visit: Payer: Self-pay | Admitting: Internal Medicine

## 2015-12-29 ENCOUNTER — Ambulatory Visit (INDEPENDENT_AMBULATORY_CARE_PROVIDER_SITE_OTHER): Payer: Medicare Other | Admitting: *Deleted

## 2015-12-29 DIAGNOSIS — I639 Cerebral infarction, unspecified: Secondary | ICD-10-CM

## 2015-12-29 LAB — CUP PACEART INCLINIC DEVICE CHECK: Date Time Interrogation Session: 20170907095648

## 2015-12-29 NOTE — Progress Notes (Signed)
Loop check in clinic. Battery status: good. R-waves 0.7411mV. 0 symptom episodes, 0 tachy episodes, 0 pause episodes, 0 brady episodes. 0 AF episodes . ROV with DC 12/11 and GT 09/2016

## 2016-01-16 ENCOUNTER — Encounter: Payer: Medicare Other | Admitting: *Deleted

## 2016-03-16 ENCOUNTER — Encounter (HOSPITAL_COMMUNITY): Payer: Self-pay

## 2016-03-16 ENCOUNTER — Emergency Department (HOSPITAL_COMMUNITY): Payer: Medicare Other

## 2016-03-16 ENCOUNTER — Observation Stay (HOSPITAL_COMMUNITY)
Admission: EM | Admit: 2016-03-16 | Discharge: 2016-03-18 | Disposition: A | Discharge status: Home / Self Care | Payer: Medicare Other | Attending: Internal Medicine | Admitting: Internal Medicine

## 2016-03-16 DIAGNOSIS — Z79899 Other long term (current) drug therapy: Secondary | ICD-10-CM | POA: Diagnosis not present

## 2016-03-16 DIAGNOSIS — N183 Chronic kidney disease, stage 3 (moderate): Secondary | ICD-10-CM | POA: Insufficient documentation

## 2016-03-16 DIAGNOSIS — I10 Essential (primary) hypertension: Secondary | ICD-10-CM | POA: Diagnosis present

## 2016-03-16 DIAGNOSIS — E1021 Type 1 diabetes mellitus with diabetic nephropathy: Secondary | ICD-10-CM | POA: Insufficient documentation

## 2016-03-16 DIAGNOSIS — E10319 Type 1 diabetes mellitus with unspecified diabetic retinopathy without macular edema: Secondary | ICD-10-CM | POA: Diagnosis not present

## 2016-03-16 DIAGNOSIS — E1142 Type 2 diabetes mellitus with diabetic polyneuropathy: Secondary | ICD-10-CM | POA: Diagnosis present

## 2016-03-16 DIAGNOSIS — I951 Orthostatic hypotension: Principal | ICD-10-CM | POA: Diagnosis present

## 2016-03-16 DIAGNOSIS — E118 Type 2 diabetes mellitus with unspecified complications: Secondary | ICD-10-CM

## 2016-03-16 DIAGNOSIS — I2510 Atherosclerotic heart disease of native coronary artery without angina pectoris: Secondary | ICD-10-CM | POA: Diagnosis not present

## 2016-03-16 DIAGNOSIS — E876 Hypokalemia: Secondary | ICD-10-CM | POA: Insufficient documentation

## 2016-03-16 DIAGNOSIS — Z7982 Long term (current) use of aspirin: Secondary | ICD-10-CM | POA: Diagnosis not present

## 2016-03-16 DIAGNOSIS — N179 Acute kidney failure, unspecified: Secondary | ICD-10-CM | POA: Diagnosis not present

## 2016-03-16 DIAGNOSIS — Z7902 Long term (current) use of antithrombotics/antiplatelets: Secondary | ICD-10-CM | POA: Insufficient documentation

## 2016-03-16 DIAGNOSIS — E1042 Type 1 diabetes mellitus with diabetic polyneuropathy: Secondary | ICD-10-CM | POA: Insufficient documentation

## 2016-03-16 DIAGNOSIS — R8290 Unspecified abnormal findings in urine: Secondary | ICD-10-CM | POA: Diagnosis present

## 2016-03-16 DIAGNOSIS — I255 Ischemic cardiomyopathy: Secondary | ICD-10-CM | POA: Diagnosis present

## 2016-03-16 DIAGNOSIS — Z794 Long term (current) use of insulin: Secondary | ICD-10-CM | POA: Insufficient documentation

## 2016-03-16 DIAGNOSIS — G4733 Obstructive sleep apnea (adult) (pediatric): Secondary | ICD-10-CM | POA: Diagnosis not present

## 2016-03-16 DIAGNOSIS — B3749 Other urogenital candidiasis: Secondary | ICD-10-CM | POA: Insufficient documentation

## 2016-03-16 DIAGNOSIS — E1022 Type 1 diabetes mellitus with diabetic chronic kidney disease: Secondary | ICD-10-CM | POA: Diagnosis not present

## 2016-03-16 DIAGNOSIS — Z66 Do not resuscitate: Secondary | ICD-10-CM | POA: Insufficient documentation

## 2016-03-16 DIAGNOSIS — E860 Dehydration: Secondary | ICD-10-CM | POA: Diagnosis not present

## 2016-03-16 DIAGNOSIS — D509 Iron deficiency anemia, unspecified: Secondary | ICD-10-CM | POA: Diagnosis present

## 2016-03-16 DIAGNOSIS — N342 Other urethritis: Secondary | ICD-10-CM | POA: Diagnosis not present

## 2016-03-16 DIAGNOSIS — D508 Other iron deficiency anemias: Secondary | ICD-10-CM | POA: Diagnosis not present

## 2016-03-16 DIAGNOSIS — I5022 Chronic systolic (congestive) heart failure: Secondary | ICD-10-CM | POA: Diagnosis not present

## 2016-03-16 DIAGNOSIS — Z8673 Personal history of transient ischemic attack (TIA), and cerebral infarction without residual deficits: Secondary | ICD-10-CM

## 2016-03-16 DIAGNOSIS — R634 Abnormal weight loss: Secondary | ICD-10-CM | POA: Diagnosis present

## 2016-03-16 DIAGNOSIS — I252 Old myocardial infarction: Secondary | ICD-10-CM | POA: Insufficient documentation

## 2016-03-16 DIAGNOSIS — E785 Hyperlipidemia, unspecified: Secondary | ICD-10-CM | POA: Diagnosis present

## 2016-03-16 LAB — BASIC METABOLIC PANEL
Anion gap: 9 (ref 5–15)
BUN: 17 mg/dL (ref 6–20)
CHLORIDE: 105 mmol/L (ref 101–111)
CO2: 28 mmol/L (ref 22–32)
CREATININE: 1.81 mg/dL — AB (ref 0.44–1.00)
Calcium: 9.5 mg/dL (ref 8.9–10.3)
GFR calc Af Amer: 35 mL/min — ABNORMAL LOW (ref >60)
GFR, EST NON AFRICAN AMERICAN: 30 mL/min — AB (ref >60)
GLUCOSE: 170 mg/dL — AB (ref 65–99)
POTASSIUM: 3.7 mmol/L (ref 3.5–5.1)
Sodium: 142 mmol/L (ref 135–145)

## 2016-03-16 LAB — URINALYSIS, ROUTINE W REFLEX MICROSCOPIC
Bilirubin Urine: NEGATIVE
Glucose, UA: 100 mg/dL — AB
Ketones, ur: NEGATIVE mg/dL
NITRITE: NEGATIVE
PH: 6 (ref 5.0–8.0)
Protein, ur: 100 mg/dL — AB
SPECIFIC GRAVITY, URINE: 1.013 (ref 1.005–1.030)

## 2016-03-16 LAB — ETHANOL: Alcohol, Ethyl (B): <5 mg/dL (ref <5)

## 2016-03-16 LAB — CBC WITH DIFFERENTIAL/PLATELET
Basophils Absolute: 0 10*3/uL (ref 0.0–0.1)
Basophils Relative: 0 %
EOS PCT: 3 %
Eosinophils Absolute: 0.3 10*3/uL (ref 0.0–0.7)
HCT: 43.2 % (ref 36.0–46.0)
Hemoglobin: 14.6 g/dL (ref 12.0–15.0)
LYMPHS ABS: 1.9 10*3/uL (ref 0.7–4.0)
LYMPHS PCT: 21 %
MCH: 30.2 pg (ref 26.0–34.0)
MCHC: 33.8 g/dL (ref 30.0–36.0)
MCV: 89.3 fL (ref 78.0–100.0)
MONOS PCT: 9 %
Monocytes Absolute: 0.8 10*3/uL (ref 0.1–1.0)
Neutro Abs: 6.2 10*3/uL (ref 1.7–7.7)
Neutrophils Relative %: 67 %
PLATELETS: 211 10*3/uL (ref 150–400)
RBC: 4.84 MIL/uL (ref 3.87–5.11)
RDW: 13 % (ref 11.5–15.5)
WBC: 9.4 10*3/uL (ref 4.0–10.5)

## 2016-03-16 LAB — CBG MONITORING, ED
Glucose-Capillary: 147 mg/dL — ABNORMAL HIGH (ref 65–99)
Glucose-Capillary: 182 mg/dL — ABNORMAL HIGH (ref 65–99)

## 2016-03-16 LAB — URINE MICROSCOPIC-ADD ON

## 2016-03-16 LAB — CORTISOL: Cortisol, Plasma: 7.6 ug/dL

## 2016-03-16 LAB — TSH: TSH: 1.829 u[IU]/mL (ref 0.350–4.500)

## 2016-03-16 LAB — MAGNESIUM: Magnesium: 2.1 mg/dL (ref 1.7–2.4)

## 2016-03-16 LAB — PHOSPHORUS: Phosphorus: 2.2 mg/dL — ABNORMAL LOW (ref 2.5–4.6)

## 2016-03-16 LAB — I-STAT TROPONIN, ED: Troponin i, poc: 0.01 ng/mL (ref 0.00–0.08)

## 2016-03-16 LAB — GLUCOSE, CAPILLARY: Glucose-Capillary: 211 mg/dL — ABNORMAL HIGH (ref 65–99)

## 2016-03-16 MED ORDER — ADULT MULTIVITAMIN W/MINERALS CH
1.0000 | ORAL_TABLET | Freq: Every day | ORAL | Status: DC
  Administered 2016-03-17 – 2016-03-18 (×2): 1 via ORAL
  Filled 2016-03-16 (×2): qty 1

## 2016-03-16 MED ORDER — LORAZEPAM 0.5 MG PO TABS
0.5000 mg | ORAL_TABLET | Freq: Every day | ORAL | Status: DC
  Administered 2016-03-16 – 2016-03-17 (×2): 0.5 mg via ORAL
  Filled 2016-03-16 (×2): qty 1

## 2016-03-16 MED ORDER — ONDANSETRON HCL 4 MG PO TABS: 4.0000 mg | ORAL_TABLET | Freq: Four times a day (QID) | ORAL | Status: DC | PRN

## 2016-03-16 MED ORDER — INSULIN GLARGINE 100 UNIT/ML ~~LOC~~ SOLN
22.0000 [IU] | Freq: Every day | SUBCUTANEOUS | Status: DC
  Filled 2016-03-16: qty 0.22

## 2016-03-16 MED ORDER — ACETAMINOPHEN 650 MG RE SUPP: 650.0000 mg | Freq: Four times a day (QID) | RECTAL | Status: DC | PRN

## 2016-03-16 MED ORDER — SODIUM CHLORIDE 0.9 % IV SOLN
INTRAVENOUS | Status: DC
  Administered 2016-03-16 – 2016-03-17 (×3): via INTRAVENOUS

## 2016-03-16 MED ORDER — ASPIRIN EC 81 MG PO TBEC
81.0000 mg | DELAYED_RELEASE_TABLET | Freq: Every day | ORAL | Status: DC
  Administered 2016-03-16 – 2016-03-17 (×2): 81 mg via ORAL
  Filled 2016-03-16 (×2): qty 1

## 2016-03-16 MED ORDER — FERROUS SULFATE 325 (65 FE) MG PO TABS
325.0000 mg | ORAL_TABLET | Freq: Every day | ORAL | Status: DC
  Administered 2016-03-17: 325 mg via ORAL
  Filled 2016-03-16: qty 1

## 2016-03-16 MED ORDER — ROSUVASTATIN CALCIUM 40 MG PO TABS
40.0000 mg | ORAL_TABLET | Freq: Every day | ORAL | Status: DC
  Administered 2016-03-16 – 2016-03-17 (×2): 40 mg via ORAL
  Filled 2016-03-16 (×3): qty 1

## 2016-03-16 MED ORDER — DEXTROSE 5 % IV SOLN
1.0000 g | INTRAVENOUS | Status: DC
  Administered 2016-03-16 – 2016-03-17 (×2): 1 g via INTRAVENOUS
  Filled 2016-03-16 (×3): qty 10

## 2016-03-16 MED ORDER — INSULIN DEGLUDEC 200 UNIT/ML ~~LOC~~ SOPN: 22.0000 [IU] | PEN_INJECTOR | Freq: Every day | SUBCUTANEOUS | Status: DC

## 2016-03-16 MED ORDER — SODIUM CHLORIDE 0.9 % IV SOLN: INTRAVENOUS | Status: DC

## 2016-03-16 MED ORDER — SODIUM CHLORIDE 0.9 % IV BOLUS (SEPSIS)
1000.0000 mL | Freq: Once | INTRAVENOUS | Status: AC
  Administered 2016-03-16: 1000 mL via INTRAVENOUS

## 2016-03-16 MED ORDER — SODIUM CHLORIDE 0.9% FLUSH
3.0000 mL | Freq: Two times a day (BID) | INTRAVENOUS | Status: DC
  Administered 2016-03-18: 3 mL via INTRAVENOUS

## 2016-03-16 MED ORDER — ONDANSETRON HCL 4 MG/2ML IJ SOLN: 4.0000 mg | Freq: Four times a day (QID) | INTRAMUSCULAR | Status: DC | PRN

## 2016-03-16 MED ORDER — ACETAMINOPHEN 325 MG PO TABS: 650.0000 mg | ORAL_TABLET | Freq: Four times a day (QID) | ORAL | Status: DC | PRN

## 2016-03-16 MED ORDER — INSULIN ASPART 100 UNIT/ML ~~LOC~~ SOLN
0.0000 [IU] | Freq: Three times a day (TID) | SUBCUTANEOUS | Status: DC
  Administered 2016-03-16: 2 [IU] via SUBCUTANEOUS
  Administered 2016-03-17 (×2): 3 [IU] via SUBCUTANEOUS
  Administered 2016-03-18: 2 [IU] via SUBCUTANEOUS
  Administered 2016-03-18: 5 [IU] via SUBCUTANEOUS
  Filled 2016-03-16: qty 1

## 2016-03-16 MED ORDER — CLOPIDOGREL BISULFATE 75 MG PO TABS
75.0000 mg | ORAL_TABLET | Freq: Every day | ORAL | Status: DC
  Administered 2016-03-17 – 2016-03-18 (×2): 75 mg via ORAL
  Filled 2016-03-16 (×2): qty 1

## 2016-03-16 MED ORDER — DIGOXIN 0.0625 MG HALF TABLET
0.0625 mg | ORAL_TABLET | ORAL | Status: DC
  Administered 2016-03-16 – 2016-03-18 (×2): 0.0625 mg via ORAL
  Filled 2016-03-16 (×2): qty 1

## 2016-03-16 MED ORDER — INSULIN ASPART 100 UNIT/ML ~~LOC~~ SOLN
0.0000 [IU] | Freq: Every day | SUBCUTANEOUS | Status: DC
  Administered 2016-03-16: 2 [IU] via SUBCUTANEOUS
  Administered 2016-03-17: 3 [IU] via SUBCUTANEOUS

## 2016-03-16 MED ORDER — ENOXAPARIN SODIUM 40 MG/0.4ML ~~LOC~~ SOLN
40.0000 mg | SUBCUTANEOUS | Status: DC
  Administered 2016-03-16 – 2016-03-17 (×2): 40 mg via SUBCUTANEOUS
  Filled 2016-03-16 (×2): qty 0.4

## 2016-03-16 NOTE — ED Notes (Signed)
Attempted report 

## 2016-03-16 NOTE — H&P (Signed)
History and Physical    [name] ZOX:[id number] DOB: [birth year] DOA: 03/16/2016   PCP: Julian HySOUTH,STEPHEN ALAN, MD   Patient coming from/Resides with: Private residence/lives with mother  Admission status: Observation/telemetry -it may be medically necessary to stay a minimum 2 midnights to rule out impending and/or unexpected changes in physiologic status that may differ from initial evaluation performed in the ER and/or at time of admission therefore needs to be reevaluated in 24 hours. Patient presents with generalized weakness and weight loss of 16 pounds since August with clinical findings consistent with dehydration, acute kidney injury and persistent orthostatic hypotension despite multiple fluid challenges in the ER. Patient will require IV fluid hydration, frequent monitoring of orthostatic vital signs until symptoms resolved, cortisol level to rule out adrenal etiology and frequent monitoring of blood glucose in patient with known brittle diabetes.   Chief Complaint: Generalized weakness  HPI: [name] is a 55 y.o. 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 Patient reports generalized weakness and fatigue for the past 3 weeks. She also reports a 16 pound weight loss since August. Her mother is her primary caretaker and when the patient became weak with activity today a standing blood pressure was taken at home and was reported at 64/40. EMS was called to the home and the patient received 700 mL of normal saline prior to arrival. She has been given 2 L of normal saline in the ER with persistent orthostasis therefore request for admission was made by the EDP.  ED Course: Vital Signs:  BP 118/62   Pulse 75   Temp 98 F (36.7 C) (Oral)   Resp 14   Ht 5\' 4"  (1.626 m)   Wt 71.2 kg (157 lb)   LMP 03/13/2011   SpO2 100%   BMI 26.95 kg/m  2 view chest x-ray: No active disease Lab data: Sodium 142, potassium 3.7, BUN 17, creatinine 1.81, glucose 170, anion gap 9, poc troponin 0.01, white count 9400 with normal differential, hemoglobin 14.6, platelets 211,000, urinalysis consistent with likely UTI with few bacteria, granular casts, trace leukocytes but negative nitrite, 100 protein, yeast and 6-30 WBCs, alcohol level less than 5, urine culture pending Medications and treatments: Normal saline bolus 2 L  Review of Systems:  In addition to the HPI above,  No Fever-chills, myalgias or other constitutional symptoms No Headache, changes with Vision or hearing, new focal weakness, tingling, numbness in any extremity, dysarthria or word finding difficulty, gait disturbance or imbalance, tremors or seizure activity No problems swallowing food or Liquids, indigestion/reflux, choking or coughing while eating, abdominal pain with or after eating No Chest pain, Cough or Shortness of Breath, palpitations, orthopnea or DOE No Abdominal pain, N/V, melena,hematochezia, dark tarry stools, constipation No dysuria, malodorous urine, hematuria or flank pain No new skin rashes, lesions, masses or bruises, No new joint pains, aches, swelling or redness No recent unintentional weight gain or loss No polyuria, polydypsia or polyphagia   Past Medical History:  Diagnosis Date  . Coronary artery disease    a. (12/11) NSTEMI, in the setting of DKA, s/p Cath--LAD totalled in the midportion, diag 70% sten (small 2-mm ), LCx nonobst, OM1 40%, OM2 30%, RCA- no dz. chronic calcified LAD --> no intervention.  b. (  06/2010) NSTEMI s/p cath showing same findings   . Depression   . Diabetes mellitus    a. Insulin dependant Type I   . Diabetic nephropathy (HCC)   . Diabetic neuropathy (HCC)   . Diabetic  retinopathy   . Esophagitis   . Gastroparesis   . Hx MRSA infection   . Hyperlipidemia   . Hypertension   . Iron deficiency anemia   . Ischemic cardiomyopathy   . NSTEMI (non-ST elevated myocardial infarction) (HCC)    a. (03/2010) NSTEMI during admission for DKA  b.  . Orthostatic hypotension    a. sycopal episode in 12/11   . Osteopenia   . Stroke [facility]) First stroke 2013. Second Jan 2016   a. (2012) residual R hand/arm weakness  b. while on ASA and plavix  . Systolic heart failure    a. (04/6107) EF: 40% Mod hypokin in mid-dist-ant-sept myocard--> psuedonml L vent fill pattern, Grd 2 diast dyscfxn  . Vitamin D deficiency     Past Surgical History:  Procedure Laterality Date  . APPENDECTOMY  1980  . CESAREAN SECTION  1984  . EP IMPLANTABLE DEVICE N/A 06/27/2015   Procedure: Loop Recorder Insertion;  Surgeon: Marinus Maw, MD;  Location: [facility];  Service: Cardiovascular;  Laterality: N/A;  . REFRACTIVE SURGERY     x3  . TONSILLECTOMY     89yr of age  . TUBAL LIGATION  1987    Social History   Social History  . Marital status: Single    Spouse name: N/A  . Number of children: 1  . Years of education: N/A   Occupational History  . unemployed Unemployed   Social History Main Topics  . Smoking status: Never Smoker  . Smokeless tobacco: Never Used  . Alcohol use No  . Drug use: No  . Sexual activity: Not on file   Other Topics Concern  . Not on file   Social History Narrative   Divorced-1 son   Living with mother.  Using walker.         Mobility: Cane and rolling walker Work history: Disabled   Allergies  Allergen Reactions  . Erythromycin Other (See Comments)    GI upset  . Food Nausea Only    Green peas  . Metoclopramide Hcl Other (See Comments)     fatique, depression  . Reglan [Metoclopramide] Other (See Comments)    Pt states she feels depressed when taking the medication     Family History  Problem Relation Age of Onset  .  Pancreatic cancer Father   . Diabetes Maternal Grandfather   . Colon cancer Neg Hx      Prior to Admission medications   Medication Sig Start Date End Date Taking? Authorizing Provider  aspirin EC 81 MG tablet Take 81 mg by mouth at bedtime.  10/17/10  Yes Laurey Morale, MD  Calcium Carb-Cholecalciferol (CALCIUM 500 +D PO) Take 500 mg by mouth daily with lunch.   Yes Historical Provider, MD  carvedilol (COREG) 6.25 MG tablet Take 1.5 tablets (9.375 mg total) by mouth 2 (two) times daily. 06/15/15  Yes Laurey Morale, MD  citalopram (CELEXA) 20 MG tablet Take 1 tablet (20 mg total) by mouth daily. 06/03/15  Yes Daniel J Angiulli, PA-C  clopidogrel (PLAVIX) 75 MG tablet Take 1 tablet (75 mg total) by mouth daily. 06/03/15  Yes Daniel J Angiulli, PA-C  digoxin (LANOXIN) 0.125 MG tablet Take 0.5 tablets (0.0625 mg total) by mouth  every other day. 06/03/15  Yes Daniel J Angiulli, PA-C  DULoxetine (CYMBALTA) 30 MG capsule Take 1 capsule (30 mg total) by mouth daily. 06/03/15  Yes Daniel J Angiulli, PA-C  DULoxetine (CYMBALTA) 60 MG capsule Take 60 mg by mouth daily. 02/24/16  Yes Historical Provider, MD  ferrous sulfate 325 (65 FE) MG tablet Take 2 tablets (650 mg total) by mouth daily with supper. Patient taking differently: Take 130 mg by mouth daily with supper.  06/03/15  Yes Daniel J Angiulli, PA-C  furosemide (LASIX) 20 MG tablet TAKE 1 TABLET (20 MG TOTAL) BY MOUTH DAILY. 06/03/15  Yes Daniel J Angiulli, PA-C  GLUCAGON EMERGENCY 1 MG injection Take 1 tablet by mouth as directed. 08/25/15  Yes Historical Provider, MD  Insulin Degludec (TRESIBA FLEXTOUCH) 200 UNIT/ML SOPN Inject 22 Units into the skin daily at 12 noon.   Yes Historical Provider, MD  insulin lispro (HUMALOG) 100 UNIT/ML injection Inject 1-2 Units into the skin as needed. Inject SS subQ as directed    Yes Historical Provider, MD  LORazepam (ATIVAN) 0.5 MG tablet Take 1 tablet by mouth at bedtime.  05/20/15  Yes Historical Provider, MD    rosuvastatin (CRESTOR) 40 MG tablet Take 1 tablet (40 mg total) by mouth daily with supper. 06/03/15  Yes Daniel J Angiulli, PA-C  Vitamin D, Ergocalciferol, (DRISDOL) 50000 UNITS CAPS Take 50,000 Units by mouth every 7 (seven) days. Saturday   Yes Historical Provider, MD  furosemide (LASIX) 20 MG tablet TAKE 1 TABLET (20 MG TOTAL) BY MOUTH DAILY. 12/23/15   Dolores Pattyaniel R Bensimhon, MD  Multiple Vitamin (MULTIVITAMIN WITH MINERALS) TABS tablet Take 1 tablet by mouth daily with lunch.    Historical Provider, MD    Physical Exam: Vitals:   03/16/16 1415 03/16/16 1500 03/16/16 1515 03/16/16 1600  BP: 133/86 132/62 120/76 118/62  Pulse: 77 81 90 75  Resp: 12 13 19 14   Temp:      TempSrc:      SpO2: 96% 100% 100% 100%  Weight:      Height:          Constitutional: NAD, calm, comfortable Eyes: PERRL, lids and conjunctivae normal ENMT: Mucous membranes are moist. Posterior pharynx clear of any exudate or lesions.Normal dentition.  Neck: normal, supple, no masses, no thyromegaly Respiratory: clear to auscultation bilaterally, no wheezing, no crackles. Normal respiratory effort. No accessory muscle use.  Cardiovascular: Regular rate and rhythm, no murmurs / rubs / gallops. No extremity edema. 2+ pedal pulses. No carotid bruits.  Abdomen: no tenderness, no masses palpated. No hepatosplenomegaly. Bowel sounds positive.  Musculoskeletal: no clubbing / cyanosis. No joint deformity upper and lower extremities. Good ROM, no contractures. Normal muscle tone.  Skin: no rashes, lesions, ulcers. No induration Neurologic: CN 2-12 grossly intact. Sensation intact, DTR normal. Strength 5/5 with subtle decreased strength right side Psychiatric: Normal judgment and insight. Alert and oriented x 3. Normal mood.    Labs on Admission: I have personally reviewed following labs and imaging studies  CBC:  Recent Labs Lab 03/16/16 1108  WBC 9.4  NEUTROABS 6.2  HGB 14.6  HCT 43.2  MCV 89.3  PLT 211   Basic  Metabolic Panel:  Recent Labs Lab 03/16/16 1108  NA 142  K 3.7  CL 105  CO2 28  GLUCOSE 170*  BUN 17  CREATININE 1.81*  CALCIUM 9.5   GFR: Estimated Creatinine Clearance: 33.2 mL/min (by C-G formula based on SCr of 1.81 mg/dL (H)). Liver Function Tests: No  results for input(s): AST, ALT, ALKPHOS, BILITOT, PROT, ALBUMIN in the last 168 hours. No results for input(s): LIPASE, AMYLASE in the last 168 hours. No results for input(s): AMMONIA in the last 168 hours. Coagulation Profile: No results for input(s): INR, PROTIME in the last 168 hours. Cardiac Enzymes: No results for input(s): CKTOTAL, CKMB, CKMBINDEX, TROPONINI in the last 168 hours. BNP (last 3 results) No results for input(s): PROBNP in the last 8760 hours. HbA1C: No results for input(s): HGBA1C in the last 72 hours. CBG:  Recent Labs Lab 03/16/16 1154  GLUCAP 147*   Lipid Profile: No results for input(s): CHOL, HDL, LDLCALC, TRIG, CHOLHDL, LDLDIRECT in the last 72 hours. Thyroid Function Tests: No results for input(s): TSH, T4TOTAL, FREET4, T3FREE, THYROIDAB in the last 72 hours. Anemia Panel: No results for input(s): VITAMINB12, FOLATE, FERRITIN, TIBC, IRON, RETICCTPCT in the last 72 hours. Urine analysis:    Component Value Date/Time   COLORURINE YELLOW 03/16/2016 1215   APPEARANCEUR TURBID (A) 03/16/2016 1215   LABSPEC 1.013 03/16/2016 1215   PHURINE 6.0 03/16/2016 1215   GLUCOSEU 100 (A) 03/16/2016 1215   HGBUR TRACE (A) 03/16/2016 1215   BILIRUBINUR NEGATIVE 03/16/2016 1215   KETONESUR NEGATIVE 03/16/2016 1215   PROTEINUR 100 (A) 03/16/2016 1215   UROBILINOGEN 1.0 09/15/2014 1926   NITRITE NEGATIVE 03/16/2016 1215   LEUKOCYTESUR TRACE (A) 03/16/2016 1215   Sepsis Labs: @LABRCNTIP (procalcitonin:4,lacticidven:4) )No results found for this or any previous visit (from the past 240 hour(s)).   Radiological Exams on Admission: Dg Chest 2 View  Result Date: 03/16/2016 CLINICAL DATA:  Syncopal  episode EXAM: CHEST  2 VIEW COMPARISON:  05/23/2015 FINDINGS: The heart size and mediastinal contours are within normal limits. Both lungs are clear. The visualized skeletal structures show old rib fractures on the right stable from the prior exam. Loop recorder is now identified. IMPRESSION: No active cardiopulmonary disease. Electronically Signed   By: Alcide Clever M.D.   On: 03/16/2016 11:53    EKG: (Independently reviewed) sinus rhythm with ventricular rate 60 bpm, QTC 479 ms, poor R-wave rotation, nonspecific subtle ST segment elevation in septal leads unchanged from previous EKG, borderline voltage criteria for LVH  Assessment/Plan Principal Problem:   Orthostatic hypotension -Patient presents with several weeks of generalized weakness with documented persistent orthostatic hypotension -Differential includes volume depletion from ongoing hyperglycemia as well as concomitant use of Lasix diuretic versus autonomic dysfunction in patient with known diabetic neuropathy -Continue IV fluids -Orthostatic vital signs every shift -Cortisol level -Hold offending medications (Lasix, carvedilol) -Keep CBGs well-controlled to prevent hypoglycemic related dehydration/volume loss  Active Problems:   Dehydration -As above -Check magnesium and phosphorus    Abnormal urinalysis -Appears consistent with UTI -Empiric Rocephin -Follow up on urine culture    Unintentional weight loss -Patient denied unexplained cough, frank blood in stools, and states recent mammogram without lesions -Possibly related to excessive volume loss as documented above -Additional workup per primary care physician as an outpatient -Check TSH    Acute kidney injury  -Baseline creatinine 1.2 -Current creatinine 1.81 -All human resuscitation -Hold offending medications as documented above    Ischemic cardiomyopathy/chronic systolic heart failure -Echocardiogram January 2017 with ejection fraction 35-40% -Holding  diuretics and carvedilol -Continue digoxin    DM type 2 with diabetic peripheral neuropathy  -Continue Tresiba -Hemoglobin A1c -Sensitive sliding scale insulin    Benign essential HTN -Suboptimal blood pressures with documented orthostatic hypotension so holding carvedilol and diuretics    OSA (obstructive sleep apnea) -Monitor  for nocturnal hypoxia    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 -Has residual right-sided weakness and utilizes cane or rolling walker at home    Coronary artery disease involving native coronary artery of native heart without angina pectoris -Current asymptomatic -Beta blocker on hold as above -Continue baby aspirin and statin    Anemia, iron deficiency -Continue preadmission iron supplementation -Hemoglobin stable at 14 monitor for decreases after hydration    HLD (hyperlipidemia) -Continue Crestor -No reports of myalgias or leg aches/pains      DVT prophylaxis: Lovenox Code Status: DO NOT RESUSCITATE Family Communication: No family at bedside at time of evaluation Disposition Plan: Anticipate discharge back to preadmission home apartment once medically stable Consults called: None     ELLIS,ALLISON L. ANP-BC [facility] Pager [phone]   If 7PM-7AM, please contact night-coverage [email or web address] Password [email or web address]  03/16/2016, 5:06 PM

## 2016-03-16 NOTE — ED Provider Notes (Signed)
[facility]-EMER[facility]ENCY DEPT Provider Note   CSN: [id number] Arrival date & time: 03/16/16  1036     History   Chief Complaint Chief Complaint  Patient presents with  . Weakness    HPI Tenishia TORAH PINNOCK is a 55 y.o. female Family and patient provide the history. HPI Pt has been having trouble with weakness, poor appetite over the last several weeks.  She has had a 16 lb weight loss since August as well.  Pt has fallen a few times over the last few weeks.   She denies syncope.  Denies injury. She is scheduled for a doctor later this months so she had not seen her doctor yet.  No vomiting.  NO diarrhea.  No chest pain or shortness of breath.  CBG was running high today.  They called Dr Everardo All and was told to come to the ED.  At home family took BP it was 100 when lying but 63 systolic when standing Past Medical History:  Diagnosis Date  . Coronary artery disease    a. (12/11) NSTEMI, in the setting of DKA, s/p Cath--LAD totalled in the midportion, diag 70% sten (small 2-mm ), LCx nonobst, OM1 40%, OM2 30%, RCA- no dz. chronic calcified LAD --> no intervention.  b. (06/2010) NSTEMI s/p cath showing same findings   . Depression   . Diabetes mellitus    a. Insulin dependant Type I   . Diabetic nephropathy (HCC)   . Diabetic neuropathy (HCC)   . Diabetic retinopathy   . Esophagitis   . Gastroparesis   . Hx MRSA infection   . Hyperlipidemia   . Hypertension   . Iron deficiency anemia   . Ischemic cardiomyopathy   . NSTEMI (non-ST elevated myocardial infarction) (HCC)    a. (03/2010) NSTEMI during admission for DKA  b.  . Orthostatic hypotension    a. sycopal episode in 12/11   . Osteopenia   . Stroke [facility]) First stroke 2013. Second Jan 2016   a. (2012) residual R hand/arm weakness  b. while on ASA and plavix  . Systolic heart failure    a. (07/979) EF: 40% Mod hypokin in mid-dist-ant-sept myocard--> psuedonml L vent fill pattern, Grd 2 diast dyscfxn  . Vitamin D deficiency     Patient  Active Problem List   Diagnosis Date Noted  . Gait disturbance, post-stroke 07/11/2015  . Diabetes mellitus type 2 in nonobese (HCC)   . 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 Coronary artery disease involving native coronary artery of native heart without angina pectoris   . Chronic systolic congestive heart failure (HCC)   . Anemia, iron deficiency   . HLD (hyperlipidemia)   . Cerebrovascular accident (CVA) due to thrombosis of basilar artery (HCC)   . Diplopia   . 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 DM type 2 with diabetic peripheral neuropathy (HCC)   . History of CVA with residual deficit   . Benign essential HTN   . Hemiparesis affecting dominant side as late effect of stroke (HCC)   . DM type 2, goal A1c below 7 05/22/2015  . Heme + stool 05/04/2014  . DKA (diabetic ketoacidoses) (HCC) 03/23/2013  . Elevated troponin 03/23/2013  . Type I (juvenile type) diabetes mellitus with ophthalmic manifestations, uncontrolled(250.53) 02/17/2013  . Depression 02/17/2013  . Altered mental status 02/14/2013    Class: Acute  . Diabetic ketoacidosis (HCC) 02/14/2013    Class: Acute  . Hypercalcemia 02/14/2013    Class:  Acute  . Dehydration 02/14/2013    Class: Acute  . Acute renal insufficiency 02/14/2013    Class: Acute  . Fatigue 05/04/2012  . OSA (obstructive sleep apnea) 07/03/2011  . UTI (lower urinary tract infection) 03/21/2011  . Preseptal cellulitis 11/21/2010  . CVA (cerebral infarction) 10/17/2010  . FATIGUE 07/10/2010  . PERIMENOPAUSAL STATUS 07/10/2010  . DIABETES MELLITUS, TYPE I 04/25/2010  . DIABETIC  RETINOPATHY 04/25/2010  . Coronary atherosclerosis of native coronary artery 04/25/2010  . ISCHEMIC CARDIOMYOPATHY 04/25/2010  . Chronic systolic heart failure (HCC) 04/25/2010  . ORTHOSTATIC HYPOTENSION 04/25/2010  . Hyperlipidemia 08/30/2008  . ANEMIA-IRON DEFICIENCY 08/30/2008  . HYPERTENSION 08/30/2008    Past  Surgical History:  Procedure Laterality Date  . APPENDECTOMY  1980  . CESAREAN SECTION  1984  . EP IMPLANTABLE DEVICE N/A 06/27/2015   Procedure: Loop Recorder Insertion;  Surgeon: Marinus Maw, MD;  Location: [facility] INVASIVE CV LAB;  Service: Cardiovascular;  Laterality: N/A;  . REFRACTIVE SURGERY     x3  . TONSILLECTOMY     11yr of age  . TUBAL LIGATION  1987    OB History    No data available       Home Medications    Prior to Admission medications   Medication Sig Start Date End Date Taking? Authorizing Provider  aspirin EC 81 MG tablet Take 81 mg by mouth at bedtime.  10/17/10   Laurey Morale, MD  Calcium Carb-Cholecalciferol (CALCIUM 500 +D PO) Take 500 mg by mouth daily with lunch.    Historical Provider, MD  carvedilol (COREG) 6.25 MG tablet Take 1.5 tablets (9.375 mg total) by mouth 2 (two) times daily. 06/15/15   Laurey Morale, MD  citalopram (CELEXA) 20 MG tablet Take 1 tablet (20 mg total) by mouth daily. 06/03/15   Mcarthur Rossetti Angiulli, PA-C  clopidogrel (PLAVIX) 75 MG tablet Take 1 tablet (75 mg total) by mouth daily. 06/03/15   Mcarthur Rossetti Angiulli, PA-C  digoxin (LANOXIN) 0.125 MG tablet Take 0.5 tablets (0.0625 mg total) by mouth every other day. 06/03/15   Mcarthur Rossetti Angiulli, PA-C  DULoxetine (CYMBALTA) 30 MG capsule Take 1 capsule (30 mg total) by mouth daily. Patient taking differently: Take 60 mg by mouth daily.  06/03/15   Mcarthur Rossetti Angiulli, PA-C  ferrous sulfate 325 (65 FE) MG tablet Take 2 tablets (650 mg total) by mouth daily with supper. Patient taking differently: Take 130 mg by mouth daily with supper.  06/03/15   Mcarthur Rossetti Angiulli, PA-C  furosemide (LASIX) 20 MG tablet TAKE 1 TABLET (20 MG TOTAL) BY MOUTH DAILY. 06/03/15   Mcarthur Rossetti Angiulli, PA-C  furosemide (LASIX) 20 MG tablet TAKE 1 TABLET (20 MG TOTAL) BY MOUTH DAILY. 12/23/15   Dolores Patty, MD  GLUCAGON EMERGENCY 1 MG injection Take 1 tablet by mouth as directed. 08/25/15   Historical Provider, MD  Insulin  Degludec (TRESIBA FLEXTOUCH) 200 UNIT/ML SOPN Inject 22 Units into the skin daily at 12 noon.    Historical Provider, MD  insulin lispro (HUMALOG) 100 UNIT/ML injection Inject 2-5 Units into the skin as needed. Inject SS subQ as directed     Historical Provider, MD  LORazepam (ATIVAN) 0.5 MG tablet Take 1 tablet by mouth at bedtime.  05/20/15   Historical Provider, MD  Multiple Vitamin (MULTIVITAMIN WITH MINERALS) TABS tablet Take 1 tablet by mouth daily with lunch.    Historical Provider, MD  ONETOUCH VERIO test strip  06/15/15   Historical  Provider, MD  rosuvastatin (CRESTOR) 40 MG tablet Take 1 tablet (40 mg total) by mouth daily with supper. 06/03/15   Mcarthur Rossettianiel J Angiulli, PA-C  Vitamin D, Ergocalciferol, (DRISDOL) 50000 UNITS CAPS Take 50,000 Units by mouth every 7 (seven) days. Saturday    Historical Provider, MD    Family History Family History  Problem Relation Age of Onset  . Pancreatic cancer Father   . Diabetes Maternal Grandfather   . Colon cancer Neg Hx     Social History Social History  Substance Use Topics  . Smoking status: Never Smoker  . Smokeless tobacco: Never Used  . Alcohol use No     Allergies   Erythromycin; Food; Metoclopramide hcl; and Reglan [metoclopramide]   Review of Systems Review of Systems  All other systems reviewed and are negative.    Physical Exam Updated Vital Signs BP 120/76   Pulse 90   Temp 98 F (36.7 C) (Oral)   Resp 19   Ht 5\' 4"  (1.626 m)   Wt 71.2 kg   LMP 03/13/2011   SpO2 100%   BMI 26.95 kg/m   Physical Exam  Constitutional: She appears well-developed and well-nourished. No distress.  HENT:  Head: Normocephalic and atraumatic.  Right Ear: External ear normal.  Left Ear: External ear normal.  Eyes: Conjunctivae are normal. Right eye exhibits no discharge. Left eye exhibits no discharge. No scleral icterus.  Neck: Neck supple. No tracheal deviation present.  Cardiovascular: Normal rate, regular rhythm and intact  distal pulses.   Pulmonary/Chest: Effort normal and breath sounds normal. No stridor. No respiratory distress. She has no wheezes. She has no rales.  Abdominal: Soft. Bowel sounds are normal. She exhibits no distension. There is no tenderness. There is no rebound and no guarding.  Musculoskeletal: She exhibits no edema or tenderness.  Neurological: She is alert. She has normal strength. No cranial nerve deficit (no facial droop, extraocular movements intact, no slurred speech) or sensory deficit. She exhibits normal muscle tone. She displays no seizure activity. Coordination normal.  Skin: Skin is warm and dry. No rash noted.  Psychiatric: She has a normal mood and affect.  Nursing note and vitals reviewed.    ED Treatments / Results  Labs (all labs ordered are listed, but only abnormal results are displayed) Labs Reviewed  BASIC METABOLIC PANEL - Abnormal; Notable for the following:       Result Value   Glucose, Bld 170 (*)    Creatinine, Ser 1.81 (*)    GFR calc non Af Amer 30 (*)    GFR calc Af Amer 35 (*)    All other components within normal limits  URINALYSIS, ROUTINE W REFLEX MICROSCOPIC (NOT AT [facility]) - Abnormal; Notable for the following:    APPearance TURBID (*)    Glucose, UA 100 (*)    Hgb urine dipstick TRACE (*)    Protein, ur 100 (*)    Leukocytes, UA TRACE (*)    All other components within normal limits  URINE MICROSCOPIC-ADD ON - Abnormal; Notable for the following:    Squamous Epithelial / LPF 0-5 (*)    Bacteria, UA FEW (*)    Casts GRANULAR CAST (*)    All other components within normal limits  CBG MONITORING, ED - Abnormal; Notable for the following:    Glucose-Capillary 147 (*)    All other components within normal limits  URINE CULTURE  CBC WITH DIFFERENTIAL/PLATELET  POCT CBG (FASTING - GLUCOSE)-MANUAL ENTRY  I-STAT TROPOININ, ED  EKG  EKG Interpretation  Date/Time:  Friday March 16 2016 11:10:55 EST Ventricular Rate:  68 PR Interval:      QRS Duration: 79 QT Interval:  450 QTC Calculation: 479 R Axis:   57 Text Interpretation:  Sinus rhythm Borderline low voltage, extremity leads Anteroseptal infarct, old Minimal ST depression No significant change since last tracing Confirmed by Kaula Klenke  MD-J, Emmie Frakes [phone]) on 03/16/2016 11:50:28 AM       Radiology Dg Chest 2 View  Result Date: 03/16/2016 CLINICAL DATA:  Syncopal episode EXAM: CHEST  2 VIEW COMPARISON:  05/23/2015 FINDINGS: The heart size and mediastinal contours are within normal limits. Both lungs are clear. The visualized skeletal structures show old rib fractures on the right stable from the prior exam. Loop recorder is now identified. IMPRESSION: No active cardiopulmonary disease. Electronically Signed   By: Alcide CleverMark  Lukens M.D.   On: 03/16/2016 11:53    Procedures Procedures (including critical care time)  Medications Ordered in ED Medications  sodium chloride 0.9 % bolus 1,000 mL (0 mLs Intravenous Stopped 03/16/16 1319)    And  0.9 %  sodium chloride infusion ( Intravenous New Bag/Given 03/16/16 1319)  sodium chloride 0.9 % bolus 1,000 mL (0 mLs Intravenous Stopped 03/16/16 1502)     Initial Impression / Assessment and Plan / ED Course  I have reviewed the triage vital signs and the nursing notes.  Pertinent labs & imaging results that were available during my care of the patient were reviewed by me and considered in my medical decision making (see chart for details).  Clinical Course as of Mar 16 1525  Fri Mar 16, 2016  1306 Orthostatic vital signs reviewed.  Pt has a significant drop in BP when standing.  Cr is elevated.  Initial CBC is normal.  Trop is normal.  Will give another liter of fluid and reassess.  [JK]    Clinical Course User Index [JK] Linwood DibblesJon Keilyn Nadal, MD    Pt was given several liters of fluids.  She continues to be orthostatic unfortunately.   SHe mentions decrease po intake and weight loss but clinically does not appear to be that dehydrated.   Could be related to her medications as well.  Will consult with medical service for admission and further evaluation.  Final Clinical Impressions(s) / ED Diagnoses   Final diagnoses:  Orthostatic hypotension      Linwood DibblesJon Bilan Tedesco, MD 03/16/16 1528

## 2016-03-16 NOTE — ED Notes (Signed)
Friend with pt. Wants nurse to look at two areas on pts back. "look like two infected areas, feels hard lie a pea".....ibuprofen informed pts RN

## 2016-03-16 NOTE — ED Notes (Signed)
Patient transported to X-ray 

## 2016-03-16 NOTE — ED Triage Notes (Signed)
Pt presents from home via [facility] for evaluation of generalized weakness and fatigue x 3 weeks. Pt. States home BP cuff read 64/40 this AM. Pt. VSS with EMS. Given 700 ml NS in route. Pt AxO x4.

## 2016-03-17 DIAGNOSIS — I5022 Chronic systolic (congestive) heart failure: Secondary | ICD-10-CM

## 2016-03-17 DIAGNOSIS — I2510 Atherosclerotic heart disease of native coronary artery without angina pectoris: Secondary | ICD-10-CM | POA: Diagnosis not present

## 2016-03-17 DIAGNOSIS — I951 Orthostatic hypotension: Secondary | ICD-10-CM

## 2016-03-17 DIAGNOSIS — N390 Urinary tract infection, site not specified: Secondary | ICD-10-CM

## 2016-03-17 DIAGNOSIS — N179 Acute kidney failure, unspecified: Secondary | ICD-10-CM | POA: Diagnosis not present

## 2016-03-17 DIAGNOSIS — E118 Type 2 diabetes mellitus with unspecified complications: Secondary | ICD-10-CM | POA: Diagnosis not present

## 2016-03-17 LAB — COMPREHENSIVE METABOLIC PANEL
ALT: 15 U/L (ref 14–54)
ANION GAP: 7 (ref 5–15)
AST: 19 U/L (ref 15–41)
Albumin: 2.7 g/dL — ABNORMAL LOW (ref 3.5–5.0)
Alkaline Phosphatase: 46 U/L (ref 38–126)
BUN: 15 mg/dL (ref 6–20)
CHLORIDE: 109 mmol/L (ref 101–111)
CO2: 25 mmol/L (ref 22–32)
Calcium: 8.6 mg/dL — ABNORMAL LOW (ref 8.9–10.3)
Creatinine, Ser: 1.94 mg/dL — ABNORMAL HIGH (ref 0.44–1.00)
GFR, EST AFRICAN AMERICAN: 32 mL/min — AB (ref >60)
GFR, EST NON AFRICAN AMERICAN: 28 mL/min — AB (ref >60)
Glucose, Bld: 165 mg/dL — ABNORMAL HIGH (ref 65–99)
POTASSIUM: 2.7 mmol/L — AB (ref 3.5–5.1)
Sodium: 141 mmol/L (ref 135–145)
Total Bilirubin: 0.4 mg/dL (ref 0.3–1.2)
Total Protein: 5.3 g/dL — ABNORMAL LOW (ref 6.5–8.1)

## 2016-03-17 LAB — GLUCOSE, CAPILLARY
GLUCOSE-CAPILLARY: 203 mg/dL — AB (ref 65–99)
Glucose-Capillary: 215 mg/dL — ABNORMAL HIGH (ref 65–99)
Glucose-Capillary: 279 mg/dL — ABNORMAL HIGH (ref 65–99)

## 2016-03-17 LAB — CBC
HEMATOCRIT: 36.6 % (ref 36.0–46.0)
HEMOGLOBIN: 12.2 g/dL (ref 12.0–15.0)
MCH: 29.8 pg (ref 26.0–34.0)
MCHC: 33.3 g/dL (ref 30.0–36.0)
MCV: 89.5 fL (ref 78.0–100.0)
PLATELETS: 182 10*3/uL (ref 150–400)
RBC: 4.09 MIL/uL (ref 3.87–5.11)
RDW: 13 % (ref 11.5–15.5)
WBC: 7.7 10*3/uL (ref 4.0–10.5)

## 2016-03-17 LAB — URINE CULTURE: Culture: >=100000 — AB

## 2016-03-17 LAB — CREATININE, URINE, RANDOM: CREATININE, URINE: 38.33 mg/dL

## 2016-03-17 LAB — BASIC METABOLIC PANEL
ANION GAP: 8 (ref 5–15)
BUN: 13 mg/dL (ref 6–20)
CO2: 20 mmol/L — ABNORMAL LOW (ref 22–32)
Calcium: 8.8 mg/dL — ABNORMAL LOW (ref 8.9–10.3)
Chloride: 110 mmol/L (ref 101–111)
Creatinine, Ser: 1.83 mg/dL — ABNORMAL HIGH (ref 0.44–1.00)
GFR calc Af Amer: 34 mL/min — ABNORMAL LOW (ref >60)
GFR, EST NON AFRICAN AMERICAN: 30 mL/min — AB (ref >60)
GLUCOSE: 258 mg/dL — AB (ref 65–99)
POTASSIUM: 4 mmol/L (ref 3.5–5.1)
Sodium: 138 mmol/L (ref 135–145)

## 2016-03-17 LAB — NA AND K (SODIUM & POTASSIUM), RAND UR
Potassium Urine: 12 mmol/L
Sodium, Ur: 110 mmol/L

## 2016-03-17 MED ORDER — POTASSIUM CHLORIDE CRYS ER 20 MEQ PO TBCR
40.0000 meq | EXTENDED_RELEASE_TABLET | ORAL | Status: AC
  Administered 2016-03-17 (×2): 40 meq via ORAL
  Filled 2016-03-17: qty 2

## 2016-03-17 MED ORDER — INSULIN GLARGINE 100 UNIT/ML ~~LOC~~ SOLN
10.0000 [IU] | Freq: Every day | SUBCUTANEOUS | Status: DC
  Administered 2016-03-17 – 2016-03-18 (×2): 10 [IU] via SUBCUTANEOUS
  Filled 2016-03-17 (×2): qty 0.1

## 2016-03-17 NOTE — Progress Notes (Signed)
CRITICAL VALUE ALERT  Critical value received: K Level of 2.7  Date of notification:03/17/16  Time of notification:0445  Critical value read back:yes  Nurse who received alert: L. Sherlyn HayAbiera  MD notified (1st page):Dr Montez Moritaarter  Time of first page:0449  MD notified (2nd page):  Time of second page:  Responding MD:Dr Montez Moritaarter  Time MD responded:0455

## 2016-03-17 NOTE — Progress Notes (Signed)
PROGRESS NOTE    [name]  WUJ:[id number] DOB: 1960-07-09 DOA: 03/16/2016 PCP: Julian Hy, MD    Brief Narrative:  55 yo female presented with generalized weakness for the last 3 weeks, associated with low appetite and weight loss. (16 lbs since 11/2015). Developed worsening symptoms and noted to be hypotensive while standing with systolic 64. Patient did not respond to initial IV fluids. On admission blood pressure 118 systolic with good mentation.Tested positive for urine infection. Admitted for further management.    Assessment & Plan:   Principal Problem:   Orthostatic hypotension Active Problems:   Ischemic cardiomyopathy   OSA (obstructive sleep apnea)   Dehydration   DM type 2 with diabetic peripheral neuropathy (HCC)   Benign essential HTN   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   Coronary artery disease involving native coronary artery of native heart without angina pectoris   Chronic systolic congestive heart failure (HCC)   Anemia, iron deficiency   HLD (hyperlipidemia)   Abnormal urinalysis   Unintentional weight loss   Acute kidney injury (HCC)   Diabetes mellitus with complication (HCC)   Infective urethritis   1. Orthostatic hypotension. Multifactorial, will decrease rate of IV fluids and follow on orthostatics. On admission blood pressure drop from 135 to 103 systolic from supine to standing, with HR from 78 to 86. Continue to hold antihypertensive medications and diuretics.   2, Urinary tract infection. Will continue antibiotic therapy with ceftriaxone, will follow on cultures, cell count and temperature curve.   3, AKI on ckd stage 3, with baseline calculated GFR 50. Will continue hydration with saline, urine output  1250 cc over last 24 hours, will continue to follow on renal function and electrolytes. Avoid hypotension and nephrotoxic agents.   4, Hypokalemia. Patient had k correction with kcl, follow renal panel  with K at 4.0 and cr down to 1,8. Serum bicarb at 29 and cl at 110.    5, CAD. Continue on statin therapy, asa and plavix.   6. T2DM. Will continue glucose cover and monitoring with insulin sliding scale. Basal insulin will be decreased to 10 units to avoid hypoglycemia, capillary glucose 182-211-203.   7. Systolic heart failure. Will continue gentle hydration with reduced rate of IV fluids, will continue on digoxin. Continue to hold antihypertensive agents and diuretics.   DVT prophylaxis: heparin  Code Status:  Full  Family Communication: no family at the bedside  Disposition Plan: home   Consultants:     Procedures:    Antimicrobials:   cefrtriaxone   Subjective: Patient feeling better, no chest pain, no nausea or vomiting. No orthostatic symptoms, no dysuria. Positive decreased appetite.   Objective: Vitals:   03/16/16 1757 03/16/16 1958 03/16/16 2113 03/17/16 0512  BP: 138/78 125/64 (!) 125/58 132/65  Pulse: 77 76 79 84  Resp: 16  17 16   Temp:   99.3 F (37.4 C) 98.9 F (37.2 C)  TempSrc:   Oral Oral  SpO2: 98%  99% 100%  Weight:    66.4 kg (146 lb 6.2 oz)  Height:        Intake/Output Summary (Last 24 hours) at 03/17/16 0922 Last data filed at 03/17/16 0534  Gross per 24 hour  Intake          4490.42 ml  Output             1250 ml  Net          3240.42 ml   Ceasar Mons  Weights   03/16/16 1040 03/17/16 0512  Weight: 71.2 kg (157 lb) 66.4 kg (146 lb 6.2 oz)    Examination:  General exam: deconditioned, not in pain or dyspnea E ENT: mild pallor but no icterus, oral mucosa mild dry.  Respiratory system: No wheezing, rales or rhonchi.  Respiratory effort normal. Cardiovascular system: S1 & S2 heard, RRR. No JVD, murmurs, rubs, gallops or clicks. No pedal edema. Gastrointestinal system: Abdomen is nondistended, soft and nontender. No organomegaly or masses felt. Normal bowel sounds heard. Central nervous system: Alert and oriented. No focal neurological  deficits. Extremities: Symmetric 5 x 5 power. Skin: No rashes, lesions or ulcers.    Data Reviewed: I have personally reviewed following labs and imaging studies  CBC:  Recent Labs Lab 03/16/16 1108 03/17/16 0322  WBC 9.4 7.7  NEUTROABS 6.2  --   HGB 14.6 12.2  HCT 43.2 36.6  MCV 89.3 89.5  PLT 211 182   Basic Metabolic Panel:  Recent Labs Lab 03/16/16 1108 03/16/16 1825 03/17/16 0322  NA 142  --  141  K 3.7  --  2.7*  CL 105  --  109  CO2 28  --  25  GLUCOSE 170*  --  165*  BUN 17  --  15  CREATININE 1.81*  --  1.94*  CALCIUM 9.5  --  8.6*  MG  --  2.1  --   PHOS  --  2.2*  --    GFR: Estimated Creatinine Clearance: 30 mL/min (by C-G formula based on SCr of 1.94 mg/dL (H)). Liver Function Tests:  Recent Labs Lab 03/17/16 0322  AST 19  ALT 15  ALKPHOS 46  BILITOT 0.4  PROT 5.3*  ALBUMIN 2.7*   No results for input(s): LIPASE, AMYLASE in the last 168 hours. No results for input(s): AMMONIA in the last 168 hours. Coagulation Profile: No results for input(s): INR, PROTIME in the last 168 hours. Cardiac Enzymes: No results for input(s): CKTOTAL, CKMB, CKMBINDEX, TROPONINI in the last 168 hours. BNP (last 3 results) No results for input(s): PROBNP in the last 8760 hours. HbA1C: No results for input(s): HGBA1C in the last 72 hours. CBG:  Recent Labs Lab 03/16/16 1154 03/16/16 1719 03/16/16 2121  GLUCAP 147* 182* 211*   Lipid Profile: No results for input(s): CHOL, HDL, LDLCALC, TRIG, CHOLHDL, LDLDIRECT in the last 72 hours. Thyroid Function Tests:  Recent Labs  03/16/16 1620  TSH 1.829   Anemia Panel: No results for input(s): VITAMINB12, FOLATE, FERRITIN, TIBC, IRON, RETICCTPCT in the last 72 hours. Sepsis Labs: No results for input(s): PROCALCITON, LATICACIDVEN in the last 168 hours.  No results found for this or any previous visit (from the past 240 hour(s)).       Radiology Studies: Dg Chest 2 View  Result Date:  03/16/2016 CLINICAL DATA:  Syncopal episode EXAM: CHEST  2 VIEW COMPARISON:  05/23/2015 FINDINGS: The heart size and mediastinal contours are within normal limits. Both lungs are clear. The visualized skeletal structures show old rib fractures on the right stable from the prior exam. Loop recorder is now identified. IMPRESSION: No active cardiopulmonary disease. Electronically Signed   By: Alcide CleverMark  Lukens M.D.   On: 03/16/2016 11:53        Scheduled Meds: . aspirin EC  81 mg Oral QHS  . cefTRIAXone (ROCEPHIN)  IV  1 g Intravenous Q24H  . clopidogrel  75 mg Oral Daily  . digoxin  0.0625 mg Oral QODAY  . enoxaparin (LOVENOX) injection  40 mg Subcutaneous Q24H  . ferrous sulfate  325 mg Oral Q supper  . insulin aspart  0-5 Units Subcutaneous QHS  . insulin aspart  0-9 Units Subcutaneous TID WC  . insulin glargine  22 Units Subcutaneous Q1200  . LORazepam  0.5 mg Oral QHS  . multivitamin with minerals  1 tablet Oral Q lunch  . rosuvastatin  40 mg Oral Q supper  . sodium chloride flush  3 mL Intravenous Q12H   Continuous Infusions: . sodium chloride 125 mL/hr at 03/17/16 0327     LOS: 0 days      Coralie KeensMauricio Daniel Koi Yarbro, MD [facility] Pager [phone]  If 7PM-7AM, please contact night-coverage [email or web address] Password [email or web address] 03/17/2016, 9:22 AM

## 2016-03-18 DIAGNOSIS — E118 Type 2 diabetes mellitus with unspecified complications: Secondary | ICD-10-CM

## 2016-03-18 DIAGNOSIS — I951 Orthostatic hypotension: Secondary | ICD-10-CM | POA: Diagnosis not present

## 2016-03-18 DIAGNOSIS — N179 Acute kidney failure, unspecified: Secondary | ICD-10-CM | POA: Diagnosis not present

## 2016-03-18 DIAGNOSIS — I5022 Chronic systolic (congestive) heart failure: Secondary | ICD-10-CM | POA: Diagnosis not present

## 2016-03-18 DIAGNOSIS — E876 Hypokalemia: Secondary | ICD-10-CM

## 2016-03-18 LAB — CBC WITH DIFFERENTIAL/PLATELET
BASOS ABS: 0 10*3/uL (ref 0.0–0.1)
BASOS PCT: 1 %
Eosinophils Absolute: 0.2 10*3/uL (ref 0.0–0.7)
Eosinophils Relative: 3 %
HEMATOCRIT: 37.8 % (ref 36.0–46.0)
Hemoglobin: 12.7 g/dL (ref 12.0–15.0)
LYMPHS PCT: 33 %
Lymphs Abs: 2.6 10*3/uL (ref 0.7–4.0)
MCH: 30.3 pg (ref 26.0–34.0)
MCHC: 33.6 g/dL (ref 30.0–36.0)
MCV: 90.2 fL (ref 78.0–100.0)
Monocytes Absolute: 0.8 10*3/uL (ref 0.1–1.0)
Monocytes Relative: 10 %
NEUTROS ABS: 4.2 10*3/uL (ref 1.7–7.7)
NEUTROS PCT: 53 %
Platelets: 192 10*3/uL (ref 150–400)
RBC: 4.19 MIL/uL (ref 3.87–5.11)
RDW: 13.1 % (ref 11.5–15.5)
WBC: 7.8 10*3/uL (ref 4.0–10.5)

## 2016-03-18 LAB — BASIC METABOLIC PANEL
ANION GAP: 7 (ref 5–15)
BUN: 16 mg/dL (ref 6–20)
CO2: 23 mmol/L (ref 22–32)
Calcium: 9 mg/dL (ref 8.9–10.3)
Chloride: 110 mmol/L (ref 101–111)
Creatinine, Ser: 1.56 mg/dL — ABNORMAL HIGH (ref 0.44–1.00)
GFR, EST AFRICAN AMERICAN: 42 mL/min — AB (ref >60)
GFR, EST NON AFRICAN AMERICAN: 36 mL/min — AB (ref >60)
Glucose, Bld: 191 mg/dL — ABNORMAL HIGH (ref 65–99)
POTASSIUM: 3.8 mmol/L (ref 3.5–5.1)
SODIUM: 140 mmol/L (ref 135–145)

## 2016-03-18 LAB — GLUCOSE, CAPILLARY
Glucose-Capillary: 176 mg/dL — ABNORMAL HIGH (ref 65–99)
Glucose-Capillary: 262 mg/dL — ABNORMAL HIGH (ref 65–99)

## 2016-03-18 LAB — HEMOGLOBIN A1C
HEMOGLOBIN A1C: 10.5 % — AB (ref 4.8–5.6)
MEAN PLASMA GLUCOSE: 255 mg/dL

## 2016-03-18 MED ORDER — FUROSEMIDE 20 MG PO TABS: 20.0000 mg | ORAL_TABLET | Freq: Every day | ORAL | 0 refills | Status: DC | PRN

## 2016-03-18 MED ORDER — CARVEDILOL 6.25 MG PO TABS: 6.2500 mg | ORAL_TABLET | Freq: Two times a day (BID) | ORAL | 0 refills | Status: DC

## 2016-03-18 MED ORDER — FLUCONAZOLE 200 MG PO TABS: 200.0000 mg | ORAL_TABLET | Freq: Every day | ORAL | 0 refills | Status: AC

## 2016-03-18 MED ORDER — FLUCONAZOLE 100 MG PO TABS
200.0000 mg | ORAL_TABLET | Freq: Every day | ORAL | Status: DC
  Administered 2016-03-18: 200 mg via ORAL
  Filled 2016-03-18: qty 2

## 2016-03-18 NOTE — Progress Notes (Signed)
[name] to be D/C'd  per MD order. Discussed with the patient and all questions fully answered.  VSS, Skin clean, dry and intact without evidence of skin break down, no evidence of skin tears noted.  IV catheter discontinued intact. Site without signs and symptoms of complications. Dressing and pressure applied.  An After Visit Summary was printed and given to the patient. Patient received prescription.  D/c education completed with patient/family including follow up instructions, medication list, d/c activities limitations if indicated, with other d/c instructions as indicated by MD - patient able to verbalize understanding, all questions fully answered.   Patient instructed to return to ED, call 911, or call MD for any changes in condition.   Patient to be escorted via WC, and D/C home via private auto.

## 2016-03-18 NOTE — Progress Notes (Signed)
Patient vomited large amount of what appeared to be undigested food around 3:30am.  Pt denied pain and refused nausea medicine. Abdomen soft,nontender,bowel sounds active.  While bathing patient after this incident,noted two small intact pustules in the middle of her upper back. No drainage noted.

## 2016-03-18 NOTE — Progress Notes (Signed)
Patient's IV is d/c, given and reviewed d/c paperwork, just waiting on sister who is the patient's ride to pick her up.

## 2016-03-18 NOTE — Discharge Summary (Signed)
Physician Discharge Summary  [name] WUJ:[id number] DOB: [birth year] DOA: 03/16/2016  PCP: Julian Hy, MD  Admit date: 03/16/2016 Discharge date: 03/18/2016  Admitted From:  Home  Disposition: Home   Recommendations for Outpatient Follow-up:  1. Follow up with PCP in 1 week 2. Patient has been placed on fluconazole for 14 days, for yeast urine infection 3. Furosemide has been change to take only as needed 4. Coreg has been decreased to 6.25 mg po bid  Home Health: No  Equipment/Devices: No  Discharge Condition: Stable CODE STATUS: DNR Diet recommendation: Heart Healthy / Carb Modified   Brief/Interim Summary: 55 yo female presented with generalized weakness for the last 3 weeks, associated with low appetite and weight loss. (16 lbs since 11/2015). The day of admission she developed worsening symptoms and noted to be hypotensive while standing with systolic 64 mmHg. Patient did not respond to initial IV fluids. Her mucous membranes were reported to be moist, her lungs were clear to auscultation, heart S1-S2 present rhythmic, lower extremities no edema. Blood pressure 118/62, heart rate 74, respiratory rate 14, oxygenation 100%. Orthostatics vitals with blood pressure laying 123/62, heart rate 68, sitting 110/70 with a heart rate 83, standing 80 /48 with a heart rate of 98.  Sodium 142, potassium 3.7 chloride 105, bicarbonate 28, BUN 17, creatinine 1.81, glucose 170, white count 9.4, hemoglobin 14.6, hematocrit 43.2, platelets 211. Normal TSH. Urinalysis had 6-30 white cells, negative nitrates, positive yeast. Her urinary sodium was 110 with urinary potassium of 12. EKG was normal sinus rhythm. Chest x-ray was negative for infiltrates.  The patient was admitted to hospital with working diagnosis of orthostatic hypotension likely dehydration/overdiuresis with a possible urinary tract infection.  1. Orthostatic hypotension. Patient was admitted to the telemetry unit, patient was  placed on isotonic solutions intravenously, furosemide was held. Repeat orthostatics improved with discharge parameters as follows, laying 121/56 with a heart rate of 89, sitting 100/60 with a heart rate 91, standing 104/80 with a heart rate 84. Patient was asymptomatic. Patient will be discharged on decreased dose of Coreg, furosemide will be prescribed only as needed, for edema or dyspnea.  2. Urinary tract infection, yeast, present on admission. Initially patient was placed on ceftriaxone. Urine culture came back positive for yeast with more than 100,000 colony-forming units. Considering patient's hypotension requiring significant IV fluids, decision has been made to treat for 14 days with fluconazole. It is possible these to be a colonization but in face of clinical symptoms and more than 100,000 colony-forming units on the urine culture, patient will be received anti-fungal therapy. Note that patient currently denies any dysuria, increased urinary frequency or abdominal pain. Recommend close follow-up as an outpatient.  3. Acute kidney injury chronic kidney disease, stage III. Calculated baseline GFR 50. Patient received as tonic solutions with no signs of overload. Her kidney function improved with a discharge creatinine down to 1.56. At discharge patient was instructed take furosemide only as needed.  4. Hypokalemia. Patient's potassium rich 2.7 it was successfully corrected up to 3.8. There was significant elevation on urinary sodium along with serum hypokalemia, suggesting overdiuresis. Patient will be placed on furosemide only as needed.  5. Systolic heart failure chronic. No signs of acute exacerbation, patient received IV fluids with no signs of volume overload. Patient will continue taking digoxin, Coreg dose have been decreased, patient will take furosemide only as needed.  6. Type 2 diabetes mellitus. Patient was placed on insulin sliding-scale, pus basal insulin of 10 units daily.  Patient's  capillary glucose range between 176 and 215 over last 24 hours. Patient will resume her insulin regimen with insulin degludec 22 units and lispro sliding scale.     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   Increase activity slowly    Complete by:  As directed        Medication List    TAKE these medications   aspirin EC 81 MG tablet Take 81 mg by mouth at bedtime.   CALCIUM 500 +D PO Take 500 mg by mouth daily with lunch.   carvedilol 6.25 MG tablet Commonly known as:  COREG Take 1 tablet (6.25 mg total) by mouth 2 (two) times daily. What changed:  how much to take   citalopram 20 MG tablet Commonly known as:  CELEXA Take 1 tablet (20 mg total) by mouth daily.   clopidogrel 75 MG tablet Commonly known as:  PLAVIX Take 1 tablet (75 mg total) by mouth daily.   digoxin 0.125 MG tablet Commonly known as:  LANOXIN Take 0.5 tablets (0.0625 mg total) by mouth every other day.   DULoxetine 60 MG capsule Commonly known as:  CYMBALTA Take 60 mg by mouth daily. What changed:  Another medication with the same name was removed. Continue taking this  medication, and follow the directions you see here.   ferrous sulfate 325 (65 FE) MG tablet Take 2 tablets (650 mg total) by mouth daily with supper. What changed:  how much to take   fluconazole 200 MG tablet Commonly known as:  DIFLUCAN Take 1 tablet (200 mg total) by mouth daily.   furosemide 20 MG tablet Commonly known as:  LASIX Take 1 tablet (20 mg total) by mouth daily as needed for fluid or edema (as needed for shortness of breath or leg swelling). TAKE 1 TABLET (20 MG TOTAL) BY MOUTH DAILY. What changed:  how much to take  how to take this  when to take this  reasons to take this  Another medication with the same name was removed. Continue taking this medication, and follow the directions you see here.   GLUCAGON EMERGENCY 1 MG injection Generic drug:  glucagon Take 1 tablet by mouth as directed.   insulin lispro 100 UNIT/ML injection Commonly known as:  HUMALOG Inject 1-2 Units into the skin as needed. Inject SS subQ as directed   LORazepam 0.5 MG tablet Commonly known as:  ATIVAN Take 1 tablet by mouth at bedtime.   multivitamin with minerals Tabs tablet Take 1 tablet by mouth daily with lunch.   rosuvastatin 40 MG tablet Commonly known as:  CRESTOR Take 1 tablet (  40 mg total) by mouth daily with supper.   TRESIBA FLEXTOUCH 200 UNIT/ML Sopn Generic drug:  Insulin Degludec Inject 22 Units into the skin daily at 12 noon.   Vitamin D (Ergocalciferol) 50000 units Caps capsule Commonly known as:  DRISDOL Take 50,000 Units by mouth every 7 (seven) days. Saturday      Follow-up Information    Julian Hy, MD Follow up in 1 week(s).   Specialty:  Endocrinology Contact information: [address] [place] [address] [phone]          Allergies  Allergen Reactions  . Erythromycin Other (See Comments)    GI upset  . Food Nausea Only    Green peas  . Metoclopramide Hcl Other (See Comments)     fatique, depression  . Reglan  [Metoclopramide] Other (See Comments)    Pt states she feels depressed when taking the medication     Consultations:     Procedures/Studies: Dg Chest 2 View  Result Date: 03/16/2016 CLINICAL DATA:  Syncopal episode EXAM: CHEST  2 VIEW COMPARISON:  05/23/2015 FINDINGS: The heart size and mediastinal contours are within normal limits. Both lungs are clear. The visualized skeletal structures show old rib fractures on the right stable from the prior exam. Loop recorder is now identified. IMPRESSION: No active cardiopulmonary disease. Electronically Signed   By: Alcide Clever M.D.   On: 03/16/2016 11:53      Subjective: Patient feeling better, no nausea or vomiting this am. No chest pain or dyspnea, out of bed to chair.   Discharge Exam: Vitals:   03/18/16 0542 03/18/16 0844  BP: (!) 121/56   Pulse: 89 85  Resp:    Temp: 98.4 F (36.9 C)    Vitals:   03/17/16 1850 03/17/16 2204 03/18/16 0542 03/18/16 0844  BP:  (!) 129/55 (!) 121/56   Pulse:  81 89 85  Resp: 16 17    Temp: 97.9 F (36.6 C) 98.4 F (36.9 C) 98.4 F (36.9 C)   TempSrc: Oral Oral Oral   SpO2: 100% 99%    Weight:   66.7 kg (147 lb 0.8 oz)   Height:        General: Pt is alert, awake, not in acute distress Cardiovascular: RRR, S1/S2 +, no rubs, no gallops Respiratory: CTA bilaterally, no wheezing, no rhonchi Abdominal: Soft, NT, ND, bowel sounds + Extremities: no edema, no cyanosis    The results of significant diagnostics from this hospitalization (including imaging, microbiology, ancillary and laboratory) are listed below for reference.     Microbiology: Recent Results (from the past 240 hour(s))  Urine culture     Status: Abnormal   Collection Time: 03/16/16 12:15 PM  Result Value Ref Range Status   Specimen Description URINE, RANDOM  Final   Special Requests ADDED [id number]  Final   Culture >=100,000 COLONIES/mL YEAST (A)  Final   Report Status 03/17/2016 FINAL  Final  Culture, blood  (Routine X 2) w Reflex to ID Panel     Status: None (Preliminary result)   Collection Time: 03/16/16  4:30 PM  Result Value Ref Range Status   Specimen Description BLOOD LEFT ANTECUBITAL  Final   Special Requests BOTTLES DRAWN AEROBIC AND ANAEROBIC 5CC  Final   Culture NO GROWTH < 24 HOURS  Final   Report Status PENDING  Incomplete  Culture, blood (Routine X 2) w Reflex to ID Panel     Status: None (Preliminary result)   Collection Time: 03/16/16  4:35 PM  Result Value Ref Range Status   Specimen Description BLOOD LEFT HAND  Final   Special Requests BOTTLES DRAWN AEROBIC ONLY 6CC  Final   Culture NO GROWTH < 24 HOURS  Final   Report Status PENDING  Incomplete     Labs: BNP (last 3 results)  Recent Labs  06/09/15 1504  BNP 30.2   Basic Metabolic Panel:  Recent Labs Lab 03/16/16 1108 03/16/16 1825 03/17/16 0322 03/17/16 1400 03/18/16 0753  NA 142  --  141 138 140  K 3.7  --  2.7* 4.0 3.8  CL 105  --  109 110 110  CO2 28  --  25 20* 23  GLUCOSE 170*  --  165* 258* 191*  BUN 17  --  15 13 16   CREATININE 1.81*  --  1.94* 1.83* 1.56*  CALCIUM 9.5  --  8.6* 8.8* 9.0  MG  --  2.1  --   --   --   PHOS  --  2.2*  --   --   --    Liver Function Tests:  Recent Labs Lab 03/17/16 0322  AST 19  ALT 15  ALKPHOS 46  BILITOT 0.4  PROT 5.3*  ALBUMIN 2.7*   No results for input(s): LIPASE, AMYLASE in the last 168 hours. No results for input(s): AMMONIA in the last 168 hours. CBC:  Recent Labs Lab 03/16/16 1108 03/17/16 0322 03/18/16 0753  WBC 9.4 7.7 7.8  NEUTROABS 6.2  --  4.2  HGB 14.6 12.2 12.7  HCT 43.2 36.6 37.8  MCV 89.3 89.5 90.2  PLT 211 182 192   Cardiac Enzymes: No results for input(s): CKTOTAL, CKMB, CKMBINDEX, TROPONINI in the last 168 hours. BNP: Invalid input(s): POCBNP CBG:  Recent Labs Lab 03/16/16 2121 03/17/16 1244 03/17/16 1720 03/17/16 2203 03/18/16 0747  GLUCAP 211* 203* 215* 279* 176*   D-Dimer No results for input(s): DDIMER  in the last 72 hours. Hgb A1c No results for input(s): HGBA1C in the last 72 hours. Lipid Profile No results for input(s): CHOL, HDL, LDLCALC, TRIG, CHOLHDL, LDLDIRECT in the last 72 hours. Thyroid function studies  Recent Labs  03/16/16 1620  TSH 1.829   Anemia work up No results for input(s): VITAMINB12, FOLATE, FERRITIN, TIBC, IRON, RETICCTPCT in the last 72 hours. Urinalysis    Component Value Date/Time   COLORURINE YELLOW 03/16/2016 1215   APPEARANCEUR TURBID (A) 03/16/2016 1215   LABSPEC 1.013 03/16/2016 1215   PHURINE 6.0 03/16/2016 1215   GLUCOSEU 100 (A) 03/16/2016 1215   HGBUR TRACE (A) 03/16/2016 1215   BILIRUBINUR NEGATIVE 03/16/2016 1215   KETONESUR NEGATIVE 03/16/2016 1215   PROTEINUR 100 (A) 03/16/2016 1215   UROBILINOGEN 1.0 09/15/2014 1926   NITRITE NEGATIVE 03/16/2016 1215   LEUKOCYTESUR TRACE (A) 03/16/2016 1215   Sepsis Labs Invalid input(s): PROCALCITONIN,  WBC,  LACTICIDVEN Microbiology Recent Results (from the past 240 hour(s))  Urine culture     Status: Abnormal   Collection Time: 03/16/16 12:15 PM  Result Value Ref Range Status   Specimen Description URINE, RANDOM  Final   Special Requests ADDED [id number]  Final   Culture >=100,000 COLONIES/mL YEAST (A)  Final   Report Status 03/17/2016 FINAL  Final  Culture, blood (Routine X 2) w Reflex to ID Panel     Status: None (Preliminary result)   Collection Time: 03/16/16  4:30 PM  Result Value Ref Range Status   Specimen Description BLOOD LEFT ANTECUBITAL  Final   Special Requests BOTTLES  DRAWN AEROBIC AND ANAEROBIC 5CC  Final   Culture NO GROWTH < 24 HOURS  Final   Report Status PENDING  Incomplete  Culture, blood (Routine X 2) w Reflex to ID Panel     Status: None (Preliminary result)   Collection Time: 03/16/16  4:35 PM  Result Value Ref Range Status   Specimen Description BLOOD LEFT HAND  Final   Special Requests BOTTLES DRAWN AEROBIC ONLY 6CC  Final   Culture NO GROWTH < 24 HOURS  Final    Report Status PENDING  Incomplete     Time coordinating discharge: 45 minutes  SIGNED:   Coralie KeensMauricio Daniel Sandy Haye, MD  [facility] 03/18/2016, 9:55 AM Pager   If 7PM-7AM, please contact night-coverage [email or web address] Password [email or web address]

## 2016-03-21 LAB — CULTURE, BLOOD (ROUTINE X 2)
CULTURE: NO GROWTH
Culture: NO GROWTH

## 2016-03-26 ENCOUNTER — Ambulatory Visit (HOSPITAL_COMMUNITY)
Admission: RE | Admit: 2016-03-26 | Discharge: 2016-03-26 | Disposition: A | Discharge status: Home / Self Care | Payer: Medicare Other | Source: Ambulatory Visit | Attending: Cardiology | Admitting: Cardiology

## 2016-03-26 VITALS — BP 92/60 | HR 88 | Wt 134.8 lb

## 2016-03-26 DIAGNOSIS — I5022 Chronic systolic (congestive) heart failure: Secondary | ICD-10-CM | POA: Diagnosis not present

## 2016-03-26 DIAGNOSIS — N189 Chronic kidney disease, unspecified: Secondary | ICD-10-CM | POA: Diagnosis not present

## 2016-03-26 DIAGNOSIS — R5383 Other fatigue: Secondary | ICD-10-CM | POA: Diagnosis not present

## 2016-03-26 DIAGNOSIS — E1022 Type 1 diabetes mellitus with diabetic chronic kidney disease: Secondary | ICD-10-CM | POA: Insufficient documentation

## 2016-03-26 DIAGNOSIS — Z8673 Personal history of transient ischemic attack (TIA), and cerebral infarction without residual deficits: Secondary | ICD-10-CM | POA: Insufficient documentation

## 2016-03-26 DIAGNOSIS — I951 Orthostatic hypotension: Secondary | ICD-10-CM | POA: Insufficient documentation

## 2016-03-26 DIAGNOSIS — Z794 Long term (current) use of insulin: Secondary | ICD-10-CM | POA: Diagnosis not present

## 2016-03-26 DIAGNOSIS — I2510 Atherosclerotic heart disease of native coronary artery without angina pectoris: Secondary | ICD-10-CM | POA: Insufficient documentation

## 2016-03-26 DIAGNOSIS — I255 Ischemic cardiomyopathy: Secondary | ICD-10-CM | POA: Diagnosis not present

## 2016-03-26 DIAGNOSIS — E1021 Type 1 diabetes mellitus with diabetic nephropathy: Secondary | ICD-10-CM | POA: Diagnosis not present

## 2016-03-26 DIAGNOSIS — E785 Hyperlipidemia, unspecified: Secondary | ICD-10-CM | POA: Diagnosis not present

## 2016-03-26 DIAGNOSIS — Z7982 Long term (current) use of aspirin: Secondary | ICD-10-CM | POA: Diagnosis not present

## 2016-03-26 DIAGNOSIS — I130 Hypertensive heart and chronic kidney disease with heart failure and stage 1 through stage 4 chronic kidney disease, or unspecified chronic kidney disease: Secondary | ICD-10-CM | POA: Insufficient documentation

## 2016-03-26 DIAGNOSIS — E1065 Type 1 diabetes mellitus with hyperglycemia: Secondary | ICD-10-CM | POA: Diagnosis not present

## 2016-03-26 DIAGNOSIS — Z7902 Long term (current) use of antithrombotics/antiplatelets: Secondary | ICD-10-CM | POA: Diagnosis not present

## 2016-03-26 LAB — BASIC METABOLIC PANEL
ANION GAP: 13 (ref 5–15)
BUN: 21 mg/dL — AB (ref 6–20)
CHLORIDE: 99 mmol/L — AB (ref 101–111)
CO2: 26 mmol/L (ref 22–32)
Calcium: 9.7 mg/dL (ref 8.9–10.3)
Creatinine, Ser: 2.22 mg/dL — ABNORMAL HIGH (ref 0.44–1.00)
GFR calc Af Amer: 27 mL/min — ABNORMAL LOW (ref >60)
GFR calc non Af Amer: 23 mL/min — ABNORMAL LOW (ref >60)
GLUCOSE: 437 mg/dL — AB (ref 65–99)
POTASSIUM: 3.8 mmol/L (ref 3.5–5.1)
Sodium: 138 mmol/L (ref 135–145)

## 2016-03-26 LAB — TSH: TSH: 2.302 u[IU]/mL (ref 0.350–4.500)

## 2016-03-26 LAB — DIGOXIN LEVEL: Digoxin Level: <0.2 ng/mL — ABNORMAL LOW (ref 0.8–2.0)

## 2016-03-26 LAB — BRAIN NATRIURETIC PEPTIDE: B Natriuretic Peptide: 34.1 pg/mL (ref 0.0–100.0)

## 2016-03-26 MED ORDER — FUROSEMIDE 20 MG PO TABS: ORAL_TABLET | ORAL | 3 refills | Status: AC

## 2016-03-26 MED ORDER — CARVEDILOL 3.125 MG PO TABS: 3.1250 mg | ORAL_TABLET | Freq: Two times a day (BID) | ORAL | 3 refills | Status: AC

## 2016-03-26 NOTE — Progress Notes (Signed)
Patient ID: [name], female   DOB: [birth year], 55 y.o.   MRN: [id number] PCP: Dr. Evlyn Kanner Cardiology: Dr. Shirlee Latch  55 yo with history of Type I diabetes, CVA, CAD, and ischemic cardiomyopathy presents for cardiology followup.  She had a prolonged admission with DKA and systolic CHF in 12/11.  Cardiac enzymes were found to be elevated and left heart cath showed occluded LAD, which appeared chronic at the time of cath.  Echo showed peri-apical akinesis with EF 30%.  Management was complicated by orthostatic hypotension causing syncope.  This was likely due to combination of low cardiac output and diabetic autonomic neuropathy.  This limited titration of cardiac medications initially. Repeat echo (1/12) showed EF 40-45% (improved).  She was readmitted with DKA in 3/12, and cardiac enzymes were noted to be quite elevated with troponin peaking at 24 and CKMB at 69.  Repeat LHC was done, actually showing no significant difference from the 12/11 study.    In 5/12, she noted weakness one day in her right leg and arm.  She had a head MRI done, showing a small acute left parietal infarction.  This stroke occurred while on ASA and Plavix.  She has improved but her left arm still has some weakness. Carotid dopplers showed no significant carotid stenosis.  I did a limited echo to look for LV thrombus, and this was not seen.  3 week event monitor showed no atrial fibrillation.    Echo in 5/14 showed EF 40%.  We had to cut back on her cardiac meds due to hyperkalemia and hypotension.  She is now off ACEI completely (was hypotensive with even a low dose).  She was admitted again in 12/14 with DKA and elevated troponin (peak 2).  No chest pain.  Echo in 12/14 showed EF down to 25-30% but wall motion abnormality pattern was the same.  Given elevated creatinine, no chest pain, and very elevated troponin at prior admit with DKA without new coronary disease, I did not cath her.  Repeat echo in 3/15 showed EF 40-45% with  anteroseptal and apical severe hypokinesis to akinesis.    In 1/17, she was admitted with recurrent CVA, this time involving the left mid brain and pons.  She noted diplopia. Workup in the hospital showed unremarkable carotid dopplers and stable echo with EF 35-40%.  No arrhythmias were noted on monitoring. She went to [facility].   Lives with her mother.  She has been doing worse over the last several months. Weight is down 18 lbs. Blood glucose is uncontrolled, BS running 300s-400s.  She was admitted in 11/17 with orthostatic hypotension + UTI + AKI.  Coreg was decreased and Lasix was changed to prn.  Since coming home, she started back to taking Lasix 20 mg daily. She has fallen 5 times in the last 3 wks.  She is still lightheaded with standing.  She is very fatigued with exertion though she denies significant dyspnea or chest pain.  No orthopnea/PND. Mother says her memory is much worse and she does very little talking today. No new motor symptoms, but mother thinks that she's had a recurrent CVA.  She is unable to dress or bathe on her own due to fatigue.  She has a very poor appetite.  Mother having a hard time managing at home with her.  LINQ monitor interrogated: No concerning arrhythmias.   Labs (12/11): K 3.7, creatinine 0.88, BNP 612, LDL 29, HDL 44 Labs (1/12): digoxin 1.0, BNP 199, K 4.4, creatinine 0.9  Labs (5/12): digoxin 0.4 Labs (6/12): K 4.2, creatinine 0.99 Labs (7/12): K 6.2, creatinine 1.3 (KCl and spironolactone held).  Labs (7/12), repeat: K 4.8, creatinine 1.2, BNP 173 Labs (11/12): K 4.6, creatinine 1.6 => 1.3, TnI 0.7 Labs (12/12): K 4.9, creatinine 1.5, LDL 76, HDL 55, BNP 103, digoxin 1.0 Labs (3/13): K 4.2, creatinine 1.1, digoxin 1.0 Labs (6/13): LDL 57, HDL 51, digoxin 0.3 Labs (09/81): K 4.4, creatinine 1.4 Labs (1/14): BNP 253 => 108, K 4.4, creatinine 1.3 Labs (4/14): K 3.7, creatinine 1.2, LDL 61, HDL 57 Labs (12/14): K 4.2, creatinine 1.79, digoxin 0.7 Labs (2/15):  K 4.8, creatinine 1.07, digoxin 0.4, HCT 41.5, LFTs normal Labs (3/15): LDL 64, HDL 52 Labs (7/15): K 4.7, creatinine 1.25, digoxin 0.4 Labs (11/15): digoxin 0.5 Labs (5/16): K 4, creatinine 1.92, HCT 43 Labs (7/16): LDL 53, HDL 43 Labs (1/17): K 4.2, creatinine 1.12 Labs (2/17): K 4.1, creatinine 1.6, digoxin 0.5 Labs (5/17): LDL 57, digoxin < 0.2 Labs (11/17): K 3.8, creatinine 1.56, hgb 12.7  ECG (11/17) with NSR, old ASMI  Allergies (verified):  1)  Reglan 2)  Erythromycin  Past Medical History: 1. Hypertension: However, after MI had orthostatic hypotension.  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 CAD: NSTEMI 12/11.  Cardiac catheterization (12/11) showing LAD totalled in the midportion, diagonal 1 70% stenosed (small 2-mm vessel), circumflex nonobstructive, OM1 40%, OM2 30%, RCA small and no significant disease.  LAD occlusion was calcified and appeared chronic at the time of catheterization so no intervention.  Patient readmitted with DKA in 3/12, troponin found to be 24 and CKMB 69.  Repeat LHC showed no change from 12/11 study.  5. Ischemic cardiomyopathy: Echo (12/11) with EF 30% and periapical akinesis, no significant MR, RV looked normal.  QRS is not wide on ECG.  Echo (1/12): EF 40-45%, severe hypokinesis of the anteroseptal wall, apical inferior wall, inferoseptal wall, and apex.  Hyperkalemia with spironolactone and with > 2.5 mg daily enalapril.  Echo 7/12 with EF 40-45%.  Echo (5/14): EF 40%, mid-apical anteroseptal hypokinesis, grade II diastolic dysfunction. Echo (12/14) with EF 25-30%, periapical akinesis. Echo (3/15) with EF 40-45%, anteroseptal and apical severe hypokinesis to akinesis. Echo (1/17) with EF 35-40%, akinesis anteroseptal wall and apex.  6. Orthostatic hypotension: Syncopal episode in 12/11 likely due to this.  Probably from combination of depressed cardiac output and diabetic autonomic neuropathy.  7. Type 1 insulin-dependent diabetes. History  of DKA.  8. Hyperlipidemia.  9. Diabetic retinopathy.  10. Gastroparesis.  11. Diabetic nephropathy  12. Depression.  13. 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  Carotid dopplers (7/12) with no significant disease.  Limited echo in 7/12 did not show an LV apical thrombus.  3-week event monitor in 7/12 did not show any runs of atrial fibrillation.  CVA (1/17): Left midbrain and pons.  Carotid US (1/17) without significant stenosis.  She now has a loop recorder to look for atrial fibrillation.  14. Orthostatic hypotension: ?autonomic neuropathy related to DM.   Family History: No FH of Colon Cancer: Family History of Pancreatic Cancer: Father deceased 67 No premature CAD  Social History: Divorced, 1 boy.  Lives with mother.  unemployed, lives in [place] Patient has never smoked.  Alcohol Use - no Daily Caffeine Use 1 cup/day Illicit Drug Use - no  ROS: All systems reviewed and negative except as per HPI.    Current Outpatient Prescriptions  Medication Sig Dispense Refill  . aspirin EC 81  MG tablet Take 81 mg by mouth at bedtime.     . Calcium Carb-Cholecalciferol (CALCIUM 500 +D PO) Take 500 mg by mouth daily with lunch.    . carvedilol (COREG) 3.125 MG tablet Take 1 tablet (3.125 mg total) by mouth 2 (two) times daily. 60 tablet 3  . citalopram (CELEXA) 20 MG tablet Take 1 tablet (20 mg total) by mouth daily. 30 tablet 1  . clopidogrel (PLAVIX) 75 MG tablet Take 1 tablet (75 mg total) by mouth daily. 30 tablet 6  . digoxin (LANOXIN) 0.125 MG tablet Take 0.5 tablets (0.0625 mg total) by mouth every other day. 30 tablet 1  . DULoxetine (CYMBALTA) 60 MG capsule Take 60 mg by mouth daily.    . ferrous sulfate 325 (65 FE) MG tablet Take 2 tablets (650 mg total) by mouth daily with supper. (Patient taking differently: Take 130 mg by mouth daily with supper. ) 30 tablet 3  . fluconazole (DIFLUCAN) 200 MG tablet Take 1 tablet (200 mg total) by mouth daily. 14 tablet 0  . furosemide  (LASIX) 20 MG tablet Every other Day 15 tablet 3  . GLUCAGON EMERGENCY 1 MG injection Take 1 tablet by mouth as directed.  3  . Insulin Degludec (TRESIBA FLEXTOUCH) 200 UNIT/ML SOPN Inject 22 Units into the skin daily at 12 noon.    . insulin lispro (HUMALOG) 100 UNIT/ML injection Inject 1-2 Units into the skin as needed. Inject SS subQ as directed     . LORazepam (ATIVAN) 0.5 MG tablet Take 1 tablet by mouth at bedtime.   2  . Multiple Vitamin (MULTIVITAMIN WITH MINERALS) TABS tablet Take 1 tablet by mouth daily with lunch.    . rosuvastatin (CRESTOR) 40 MG tablet Take 1 tablet (40 mg total) by mouth daily with supper. 30 tablet 11  . Vitamin D, Ergocalciferol, (DRISDOL) 50000 UNITS CAPS Take 50,000 Units by mouth every 7 (seven) days. Saturday     No current facility-administered medications for this encounter.     BP 92/60 (BP Location: Left Arm, Patient Position: Sitting, Cuff Size: Normal)   Pulse 88   Wt 134 lb 12.8 oz (61.1 kg)   LMP 03/13/2011   SpO2 95%   BMI 23.14 kg/m  General:  Well developed, well nourished, in no acute distress. Neck:  Neck thick, JVP difficult, suspect not elevated. No masses, thyromegaly or abnormal cervical nodes. Lungs:  Crackles right base Heart:  Non-displaced PMI, chest non-tender; regular rate and rhythm, S1, S2 without murmurs, rubs or gallops. Carotid upstroke normal, no bruit. Pedals normal pulses. No edema.   Abdomen:  Bowel sounds positive; abdomen soft and non-tender without masses, organomegaly, or hernias noted. No hepatosplenomegaly. Extremities:  No clubbing or cyanosis. Neurologic:  Alert and oriented x 3. Right arm is weak.  Psych:  Normal affect.  Assessment/Plan:  CHRONIC SYSTOLIC HEART FAILURE  Ischemic cardiomyopathy. EF 25-30% on 12/14 echo.  This was lower than the prior, but was in the setting of acute DKA which may have affected her LV function. Followup echo in 3/15 showed EF 40-45% with anteroseptal and apical wall motion  abnormalities.  This is more in line with prior echoes. Most recent echo in 1/17 showed EF 35-40%.  She remains out of range for ICD placement.  She is off spironolactone as she developed hyperkalemia while taking this medication and enalapril was stopped due to symptomatic low BP even at low dose. Main complaint now is fatigue.  She has lost considerable weight and  is eating poorly.  Blood glucose is out of control.  She has orthostatic symptoms and BP is soft today.   - Decrease Lasix to every other day.  - Decrease Coreg to 3.125 mg bid. - Continue current digoxin, check level today. - Check TSH with profound fatigue.  Recent CBC was ok.  CAD Known coronary disease with occluded mid-LAD.  She had troponin peaking at 2 during 12/14 admission with DKA.  She did not have chest pain and wall motion abnormality pattern on echo was similar to the past.  Creatinine was elevated. She had a similar DKA event in 3/12 with very elevated TnI and catheterization showing no changes in her coronary disease.   I elected not to re-cath her in 12/14.  She denies any chest pain or dyspnea currently. Continue ASA 81, Plavix, and Crestor.   CVA  She has now had 2 CVAs. Carotid dopplers showed no significant disease and echo did not show LV apical thrombus. She has been on ASA 81 and Plavix.  LINQ monitor was interrogated today, no atrial fibrillation.  Mother thinks she has had a new stroke at some point in the past few months with apparent cognitive deterioration.  This is possible.  I will not repeat MRI as it would be unlikely to change management and it is a considerable hardship for mother to get her to appts.  HYPERLIPIDEMIA  Continue Crestor.  Good lipids in 5/17.    CKD Diabetic nephropathy, BMET today.  Diabetes Type I.  Poorly controlled.  Needs to see PCP.  Orthostatic hypotension I suspect this is related to weight loss and possible osmotic diuresis with uncontrolled diabetes (glucose in 300s-400s).   -  Wear compression stockings during the day.  - Cut back Lasix and Coreg as above.   I will try to arrange home health and PT.  I am not sure that her mother is going to be able to continue to take care of her.  She may need to go to a SNF.  Followup in 1 month.    Marca AnconaDalton McLean 03/26/2016

## 2016-03-26 NOTE — Patient Instructions (Signed)
Labs today (will call for abnormal results, otherwise no news is good news)  Home Health Orders placed, they will contact you.  Decrease Coreg to 3.125 (1 Tab) Two Times Daily  Decrease Lasix to 20 mg (1 Tab) One Times Every Other Day  Start Wearing compression stockings, Knee High during the day at all times.  Follow up in 1 month

## 2016-04-02 ENCOUNTER — Telehealth (HOSPITAL_COMMUNITY): Payer: Self-pay | Admitting: *Deleted

## 2016-04-02 NOTE — Telephone Encounter (Signed)
Pt's mom left a VM stating pt was going to see Dr [facility] this week and request labs be done at his office.  Order faxed to Dr [facility] for bmet this week, attempted to call pt's mom back and LM order had been faxed

## 2016-04-06 ENCOUNTER — Telehealth (HOSPITAL_COMMUNITY): Payer: Self-pay | Admitting: *Deleted

## 2016-04-06 ENCOUNTER — Telehealth: Payer: Self-pay | Admitting: Cardiology

## 2016-04-06 NOTE — Telephone Encounter (Signed)
[name] advised pt [facility], [phone]

## 2016-04-06 NOTE — Telephone Encounter (Signed)
Inetta Fermoina with [facility] called and requested for Social Work to be consulted on patient.  Called her back and gave her the verbal "OK" to place order for social work consult, per Dr. Shirlee LatchMcLean.  No further questions at this time.

## 2016-04-06 NOTE — Telephone Encounter (Signed)
TINA w/[facility] she would like and MSW consult to assist with care.

## 2016-04-11 ENCOUNTER — Other Ambulatory Visit: Payer: Self-pay | Admitting: Internal Medicine

## 2016-04-12 ENCOUNTER — Other Ambulatory Visit: Payer: Self-pay | Admitting: Cardiology

## 2016-04-30 ENCOUNTER — Ambulatory Visit (INDEPENDENT_AMBULATORY_CARE_PROVIDER_SITE_OTHER): Payer: Medicare Other | Admitting: Ophthalmology

## 2016-05-01 ENCOUNTER — Encounter (HOSPITAL_COMMUNITY): Payer: Medicare Other

## 2016-05-24 DEATH — deceased

## 2016-06-18 ENCOUNTER — Ambulatory Visit (INDEPENDENT_AMBULATORY_CARE_PROVIDER_SITE_OTHER): Payer: Medicare Other | Admitting: Ophthalmology
